# Patient Record
Sex: Female | Born: 1975 | Race: Black or African American | Hispanic: No | Marital: Single | Smoking: Never smoker
Health system: Southern US, Community
[De-identification: ages and names within clinical notes are randomized; demographics above are authoritative.]

## PROBLEM LIST (undated history)

## (undated) DIAGNOSIS — Z809 Family history of malignant neoplasm, unspecified: Secondary | ICD-10-CM

## (undated) DIAGNOSIS — K5909 Other constipation: Secondary | ICD-10-CM

## (undated) DIAGNOSIS — Z923 Personal history of irradiation: Secondary | ICD-10-CM

## (undated) DIAGNOSIS — K589 Irritable bowel syndrome without diarrhea: Secondary | ICD-10-CM

## (undated) DIAGNOSIS — D259 Leiomyoma of uterus, unspecified: Secondary | ICD-10-CM

## (undated) DIAGNOSIS — C50919 Malignant neoplasm of unspecified site of unspecified female breast: Secondary | ICD-10-CM

## (undated) DIAGNOSIS — Z9221 Personal history of antineoplastic chemotherapy: Secondary | ICD-10-CM

## (undated) HISTORY — DX: Leiomyoma of uterus, unspecified: D25.9

## (undated) HISTORY — DX: Family history of malignant neoplasm, unspecified: Z80.9

---

## 2000-10-27 ENCOUNTER — Inpatient Hospital Stay (HOSPITAL_COMMUNITY): Admission: AD | Admit: 2000-10-27 | Discharge: 2000-10-27 | Payer: Self-pay | Admitting: Obstetrics & Gynecology

## 2000-10-28 ENCOUNTER — Inpatient Hospital Stay (HOSPITAL_COMMUNITY): Admission: AD | Admit: 2000-10-28 | Discharge: 2000-10-28 | Payer: Self-pay | Admitting: *Deleted

## 2000-10-31 ENCOUNTER — Inpatient Hospital Stay (HOSPITAL_COMMUNITY): Admission: AD | Admit: 2000-10-31 | Discharge: 2000-10-31 | Payer: Self-pay | Admitting: *Deleted

## 2001-12-29 ENCOUNTER — Inpatient Hospital Stay (HOSPITAL_COMMUNITY): Admission: AD | Admit: 2001-12-29 | Discharge: 2001-12-29 | Payer: Self-pay | Admitting: *Deleted

## 2002-01-01 ENCOUNTER — Inpatient Hospital Stay (HOSPITAL_COMMUNITY): Admission: AD | Admit: 2002-01-01 | Discharge: 2002-01-01 | Payer: Self-pay | Admitting: *Deleted

## 2002-04-15 ENCOUNTER — Inpatient Hospital Stay (HOSPITAL_COMMUNITY): Admission: AD | Admit: 2002-04-15 | Discharge: 2002-04-15 | Payer: Self-pay | Admitting: *Deleted

## 2002-05-14 ENCOUNTER — Encounter: Admission: RE | Admit: 2002-05-14 | Discharge: 2002-05-14 | Payer: Self-pay | Admitting: *Deleted

## 2002-09-03 ENCOUNTER — Inpatient Hospital Stay (HOSPITAL_COMMUNITY): Admission: AD | Admit: 2002-09-03 | Discharge: 2002-09-03 | Payer: Self-pay | Admitting: *Deleted

## 2002-09-04 ENCOUNTER — Ambulatory Visit (HOSPITAL_COMMUNITY): Admission: AD | Admit: 2002-09-04 | Discharge: 2002-09-04 | Payer: Self-pay | Admitting: Obstetrics and Gynecology

## 2002-09-04 HISTORY — PX: BARTHOLIN CYST MARSUPIALIZATION: SHX5383

## 2002-09-07 ENCOUNTER — Inpatient Hospital Stay (HOSPITAL_COMMUNITY): Admission: AD | Admit: 2002-09-07 | Discharge: 2002-09-07 | Payer: Self-pay | Admitting: Obstetrics and Gynecology

## 2002-09-08 ENCOUNTER — Inpatient Hospital Stay (HOSPITAL_COMMUNITY): Admission: AD | Admit: 2002-09-08 | Discharge: 2002-09-08 | Payer: Self-pay | Admitting: Obstetrics and Gynecology

## 2002-09-12 ENCOUNTER — Inpatient Hospital Stay (HOSPITAL_COMMUNITY): Admission: AD | Admit: 2002-09-12 | Discharge: 2002-09-12 | Payer: Self-pay | Admitting: Obstetrics & Gynecology

## 2002-09-13 ENCOUNTER — Inpatient Hospital Stay (HOSPITAL_COMMUNITY): Admission: AD | Admit: 2002-09-13 | Discharge: 2002-09-13 | Payer: Self-pay | Admitting: Obstetrics & Gynecology

## 2002-09-15 ENCOUNTER — Emergency Department (HOSPITAL_COMMUNITY): Admission: EM | Admit: 2002-09-15 | Discharge: 2002-09-15 | Payer: Self-pay | Admitting: Emergency Medicine

## 2003-06-26 ENCOUNTER — Other Ambulatory Visit: Admission: RE | Admit: 2003-06-26 | Discharge: 2003-06-26 | Payer: Self-pay | Admitting: *Deleted

## 2004-05-05 ENCOUNTER — Encounter: Admission: RE | Admit: 2004-05-05 | Discharge: 2004-05-05 | Payer: Self-pay | Admitting: Family Medicine

## 2004-05-31 ENCOUNTER — Other Ambulatory Visit: Admission: RE | Admit: 2004-05-31 | Discharge: 2004-05-31 | Payer: Self-pay | Admitting: Family Medicine

## 2004-07-20 ENCOUNTER — Inpatient Hospital Stay (HOSPITAL_COMMUNITY): Admission: RE | Admit: 2004-07-20 | Discharge: 2004-07-22 | Payer: Self-pay | Admitting: Obstetrics and Gynecology

## 2004-07-20 ENCOUNTER — Encounter (INDEPENDENT_AMBULATORY_CARE_PROVIDER_SITE_OTHER): Payer: Self-pay | Admitting: Specialist

## 2004-07-20 HISTORY — PX: OVARIAN CYST REMOVAL: SHX89

## 2004-07-20 HISTORY — PX: MYOMECTOMY: SHX85

## 2004-08-29 ENCOUNTER — Inpatient Hospital Stay (HOSPITAL_COMMUNITY): Admission: AD | Admit: 2004-08-29 | Discharge: 2004-08-29 | Payer: Self-pay | Admitting: Obstetrics & Gynecology

## 2004-10-09 ENCOUNTER — Inpatient Hospital Stay (HOSPITAL_COMMUNITY): Admission: AD | Admit: 2004-10-09 | Discharge: 2004-10-09 | Payer: Self-pay | Admitting: Obstetrics and Gynecology

## 2004-11-09 ENCOUNTER — Other Ambulatory Visit: Admission: RE | Admit: 2004-11-09 | Discharge: 2004-11-09 | Payer: Self-pay | Admitting: Obstetrics and Gynecology

## 2005-02-03 ENCOUNTER — Ambulatory Visit: Payer: Self-pay | Admitting: Psychiatry

## 2005-02-03 ENCOUNTER — Other Ambulatory Visit (HOSPITAL_COMMUNITY): Admission: RE | Admit: 2005-02-03 | Discharge: 2005-03-17 | Payer: Self-pay | Admitting: Psychiatry

## 2005-07-13 ENCOUNTER — Ambulatory Visit (HOSPITAL_COMMUNITY): Admission: RE | Admit: 2005-07-13 | Discharge: 2005-07-13 | Payer: Self-pay | Admitting: Obstetrics and Gynecology

## 2005-07-13 ENCOUNTER — Encounter (INDEPENDENT_AMBULATORY_CARE_PROVIDER_SITE_OTHER): Payer: Self-pay | Admitting: Specialist

## 2005-07-13 HISTORY — PX: BARTHOLIN GLAND CYST EXCISION: SHX565

## 2005-09-05 ENCOUNTER — Other Ambulatory Visit: Admission: RE | Admit: 2005-09-05 | Discharge: 2005-09-05 | Payer: Self-pay | Admitting: Family Medicine

## 2009-06-02 ENCOUNTER — Other Ambulatory Visit: Admission: RE | Admit: 2009-06-02 | Discharge: 2009-06-02 | Payer: Self-pay | Admitting: Family Medicine

## 2010-08-13 NOTE — Op Note (Signed)
NAMEAIRYN, Candice [name]             ACCOUNT NO.:  [id number]   MEDICAL RECORD NO.:  [id number]          PATIENT TYPE:  INP   LOCATION:  NA                            [facility]   PHYSICIAN:  Michelle L. Grewal, M.D.DATE OF BIRTH:  [birth year]   DATE OF PROCEDURE:  07/20/2004  DATE OF DISCHARGE:                                 OPERATIVE REPORT   PREOPERATIVE DIAGNOSES:  1.  Symptomatic fibroids.  2.  Right ovarian cyst.   POSTOPERATIVE DIAGNOSES:  1.  Symptomatic fibroids.  2.  Right ovarian cyst.   PROCEDURE:  Exploratory laparotomy, abdominal myomectomies and right ovarian  cystectomy.   SURGEON:  Michelle L. Vincente Poli, M.D.   ASSISTANT:  Dineen Kid. Rana Snare, M.D.   ANESTHESIA:  General.   SPECIMENS:  Fibroids.   ESTIMATED BLOOD LOSS:  100 mL.   COMPLICATIONS:  None.   PROCEDURE:  The patient is taken to the operating room.  She is intubated  without difficulty.  She is prepped and draped in a sterile fashion.  A  Foley catheter is inserted and is draining clear urine.  A low transverse  incision is made and carried down to the fascia.  The fascia is scored in  the midline and extended laterally.  The rectus muscles were separated in  the midline and the peritoneum was entered bluntly.  The peritoneal incision  was then stretched.  The uterus was identified and was noted to be myomatous  and large, approximately 12 weeks' size.  It is elevated up through the  abdomen.  There was one large 9 cm fibroid emanating anteriorly, and there  was a smaller subserosal one just lateral to that as well as a smaller third  2 cm one emanating from the posterior wall of the uterus and then a smaller  3 cm one emanating from the left side of the uterus.  We then proceeded with  making a total of three incisions on the uterus.  One was anteriorly, where  two of the anterior fibroids were removed, and then two smaller incisions  made in the posterior wall of the uterus medial to the fallopian  tubes and  well away from the fallopian tubes, and all four fibroids were removed using  blunt and sharp dissection.  Each uterine bed where the fibroids were  removed was closed using a series of layers using 2-0 Vicryl in a continuous  running locked stitch, and the serosa was also closed using running 2-0  Vicryl.  Hemostasis was noted.  Of note, because of the incisions on the  uterus, the patient is not a candidate for labor and would require cesarean  section for delivery.  This had been discussed with the patient prior to  surgery, and she had voiced an understanding of this.  Interceed was placed  on the incision.  We then turned our attention to the right ovary, where a 4  cm cyst was then drained using the Bovie.  It was just a simple follicular  cyst.  Interceed was placed over this area as well.  We placed everything  back in  the abdominal cavity.  A small amount of irrigant was placed on the  uterus to keep the Interceed on the uterine incision.  The peritoneum was  closed using an 0 Vicryl in continuous running stitch, and the rectus  muscles were reapproximated using the 0 Vicryl.  The fascia was closed using  0 Vicryl in continuous running stitch, starting at each corner and meeting  in the midline.  After irrigation of the subcutaneous layer, the skin was  closed with staples.  All sponge, lap and instrument counts were correct x2.  The patient tolerated the procedure well and went to the recovery room in  stable condition.      MLG/MEDQ  D:  07/20/2004  T:  07/20/2004  Job:  [id number]

## 2010-08-13 NOTE — Discharge Summary (Signed)
[name], [name]             ACCOUNT NO.:  [id number]   MEDICAL RECORD NO.:  [id number]          PATIENT TYPE:  INP   LOCATION:  9309                          [facility]   PHYSICIAN:  Michelle L. Grewal, M.D.DATE OF BIRTH:  April 11, 1975   DATE OF ADMISSION:  07/20/2004  DATE OF DISCHARGE:  07/22/2004                                 DISCHARGE SUMMARY   ADMISSION DIAGNOSES:  1.  Symptomatic fibroids.  2.  Right ovarian cyst.   DISCHARGE DIAGNOSES:  1.  Symptomatic fibroids.  2.  Right ovarian cyst.  3.  Status post abdominal myomectomies and right ovarian cystectomy.   HOSPITAL COURSE:  The patient is a 35 year old gravida 0 with symptomatic  fibroids.  She had reported heavy periods and severe pelvic pain and  dyspareunia.  An ultrasound preoperatively revealed also a 3 cm right  ovarian cyst.  On the day of surgery she underwent an abdominal myomectomy  and right ovarian cystectomy.  Intraoperative blood loss was 100 cc.  The  findings at the time of surgery were notable for three fibroids ranging in  size from 2-10 cm.  She did very well postoperatively.  On postoperative day  #1 hemoglobin was 9.4.  She was tolerating a regular diet and voiding  without difficulty.  By postoperative day #2 she was doing very well,  tolerating a regular diet, had good pain control, and had remained afebrile  with stable vital signs.  She was discharged home in good condition on  postoperative day #2.  She was advised to call if she has any nausea and  vomiting, abdominal pain, temperature greater than 100.5 or redness or  drainage from the incision site.  She was advised no driving for two weeks  and no intercourse for six weeks.   DISCHARGE MEDICATIONS:  1.  Ibuprofen 600 mg every 6 hours as needed for pain.  2.  Tylox one-two every 4-6 as needed for pain.   FOLLOW UP:  In the office in one week.      MLG/MEDQ  D:  08/18/2004  T:  08/18/2004  Job:  [id number]

## 2010-08-13 NOTE — Op Note (Signed)
NAMEMILES, BORKOWSKI             ACCOUNT NO.:  [id number]   MEDICAL RECORD NO.:  [id number]          PATIENT TYPE:  AMB   LOCATION:  SDC                           [facility]   PHYSICIAN:  Michelle L. Grewal, M.D.DATE OF BIRTH:  06-25-1975   DATE OF PROCEDURE:  07/13/2005  DATE OF DISCHARGE:                                 OPERATIVE REPORT   PREOPERATIVE DIAGNOSIS:  Left Bartholin's cyst.   POSTOPERATIVE DIAGNOSIS:  Left Bartholin's cyst.   PROCEDURE:  Left Bartholin's cystectomy.   SURGEON:  Michelle L. Vincente Poli, M.D.   SPECIMENS:  Cyst wall.   ANESTHESIA:  MAC with local.   ESTIMATED BLOOD LOSS:  None.   COMPLICATIONS:  None.   PROCEDURE:  The patient is taken to the operating room.  She was given  sedation and placed in the lithotomy position.  She was prepped and draped  in the usual sterile fashion.  Exam under anesthesia revealed a left  Bartholin's cyst about 3 cm and mobile.  It seemed to travel up the left  labium.  I then infiltrated local on the inner aspect of both labia minora  and made a linear incision approximately 2.5 cm in length and then  identified the cyst wall. Upon entry into the cyst wall, a copious amount of  mucous appearing material was drained.  We then used sharp and blunt  dissection and electrocautery and went down to the base of the cyst wall and  removed the cyst wall.  I then cauterized the base because it was very  vascular. We then closed in two layers of this potential space using 0  Vicryl in a continuous running locked stitch.  The skin was then closed  using 2-0 Vicryl in a subcuticular fashion.  At the end of the procedure,  the labia was nicely symmetric with the right one and hemostasis was  excellent.  Neosporin ointment was applied to the incision. All sponge, lap  and instrument counts were correct x2.  The patient went to the recovery  room in stable condition.  In the recovery room, I have instructed the nurse  to place an  ice pack to her perineum.      Michelle L. Vincente Poli, M.D.  Electronically Signed     MLG/MEDQ  D:  07/13/2005  T:  07/13/2005  Job:  [id number]

## 2010-08-13 NOTE — Op Note (Signed)
   NAMEDONAE, KUEKER                         ACCOUNT NO.:  [id number]   MEDICAL RECORD NO.:  [id number]                   PATIENT TYPE:  AMB   LOCATION:  SDC                                  [facility]   PHYSICIAN:  Phil D. Okey Dupre, M.D.                  DATE OF BIRTH:  [birth year]   DATE OF PROCEDURE:  09/04/2002  DATE OF DISCHARGE:                                 OPERATIVE REPORT   PREOPERATIVE DIAGNOSIS:  Recurrent left Bartholin abscess and cyst.   POSTOPERATIVE DIAGNOSIS:  Recurrent left Bartholin abscess and cyst.   PROCEDURE:  Incision and drainage and marsupialization of Bartholin's cyst.   SURGEON:  Phil D. Okey Dupre, M.D.   FINDINGS:  A 3 x 4 cm fluctuant cyst in the Bartholin gland on the left  labia.   DESCRIPTION OF PROCEDURE:  After satisfactory MAC sedation, with the patient  in the dorsal lithotomy position, the perineum and vagina were prepped and  draped in the usual sterile manner.  The left labia was injected with 1%  Xylocaine, the area just inside of the labia minora.  The labia majora was  then folded back to expose the vestibule of the vagina just to the side of  the hymenal ring, a triangular incision 0.75 cm x 0.75 cm x 0.75 were made  into the cavity of the cyst.  The mucoid clear exudate extruded from the  cavity of this cyst and using 3-0 chromic catgut sutures, figure-of-eights,  the area around this cyst cavity was sutured to the outlying mucosa forming  the triangular opening.  Once this was finished, a Word catheter was  inserted into the cavity and the balloon dilated to 8 mL.  There was minimal  bleeding during the procedure.  The patient tolerated the procedure well.  Tape, sponge, needle and instrument counts were reported as correct and the  patient was transferred to the recovery room in satisfactory condition.                                               Phil D. Okey Dupre, M.D.    PDR/MEDQ  D:  09/04/2002  T:  09/04/2002  Job:  [id number]

## 2010-10-27 ENCOUNTER — Other Ambulatory Visit: Payer: Self-pay | Admitting: Family Medicine

## 2010-10-27 ENCOUNTER — Other Ambulatory Visit (HOSPITAL_COMMUNITY)
Admission: RE | Admit: 2010-10-27 | Discharge: 2010-10-27 | Disposition: A | Discharge status: Home / Self Care | Payer: BC Managed Care – PPO | Source: Ambulatory Visit | Attending: Family Medicine | Admitting: Family Medicine

## 2010-10-27 DIAGNOSIS — Z124 Encounter for screening for malignant neoplasm of cervix: Secondary | ICD-10-CM | POA: Insufficient documentation

## 2014-03-28 HISTORY — PX: HYSTEROSCOPY: SHX211

## 2016-03-23 ENCOUNTER — Other Ambulatory Visit: Payer: Self-pay | Admitting: Obstetrics & Gynecology

## 2016-03-23 DIAGNOSIS — Z1231 Encounter for screening mammogram for malignant neoplasm of breast: Secondary | ICD-10-CM

## 2016-03-25 ENCOUNTER — Ambulatory Visit
Admission: RE | Admit: 2016-03-25 | Discharge: 2016-03-25 | Disposition: A | Discharge status: Home / Self Care | Payer: No Typology Code available for payment source | Source: Ambulatory Visit | Attending: Obstetrics & Gynecology | Admitting: Obstetrics & Gynecology

## 2016-03-25 DIAGNOSIS — Z1231 Encounter for screening mammogram for malignant neoplasm of breast: Secondary | ICD-10-CM

## 2016-03-30 ENCOUNTER — Other Ambulatory Visit: Payer: Self-pay | Admitting: Obstetrics & Gynecology

## 2016-03-30 ENCOUNTER — Other Ambulatory Visit (HOSPITAL_COMMUNITY): Payer: Self-pay | Admitting: *Deleted

## 2016-03-30 DIAGNOSIS — R928 Other abnormal and inconclusive findings on diagnostic imaging of breast: Secondary | ICD-10-CM

## 2016-04-14 ENCOUNTER — Ambulatory Visit (HOSPITAL_COMMUNITY): Payer: No Typology Code available for payment source

## 2016-04-14 ENCOUNTER — Other Ambulatory Visit: Payer: No Typology Code available for payment source

## 2016-04-21 ENCOUNTER — Other Ambulatory Visit (HOSPITAL_COMMUNITY): Payer: Self-pay | Admitting: Obstetrics and Gynecology

## 2016-04-21 ENCOUNTER — Encounter (HOSPITAL_COMMUNITY): Payer: Self-pay

## 2016-04-21 ENCOUNTER — Ambulatory Visit
Admission: RE | Admit: 2016-04-21 | Discharge: 2016-04-21 | Disposition: A | Discharge status: Home / Self Care | Payer: No Typology Code available for payment source | Source: Ambulatory Visit | Attending: Obstetrics and Gynecology | Admitting: Obstetrics and Gynecology

## 2016-04-21 ENCOUNTER — Other Ambulatory Visit: Payer: No Typology Code available for payment source

## 2016-04-21 ENCOUNTER — Ambulatory Visit (HOSPITAL_COMMUNITY)
Admission: RE | Admit: 2016-04-21 | Discharge: 2016-04-21 | Disposition: A | Discharge status: Home / Self Care | Payer: Medicaid Other | Source: Ambulatory Visit | Attending: Obstetrics and Gynecology | Admitting: Obstetrics and Gynecology

## 2016-04-21 VITALS — BP 108/70 | Temp 98.1°F | Ht 64.0 in | Wt 139.4 lb

## 2016-04-21 DIAGNOSIS — R928 Other abnormal and inconclusive findings on diagnostic imaging of breast: Secondary | ICD-10-CM | POA: Insufficient documentation

## 2016-04-21 DIAGNOSIS — N6310 Unspecified lump in the right breast, unspecified quadrant: Secondary | ICD-10-CM

## 2016-04-21 DIAGNOSIS — N6315 Unspecified lump in the right breast, overlapping quadrants: Secondary | ICD-10-CM

## 2016-04-21 DIAGNOSIS — Z1239 Encounter for other screening for malignant neoplasm of breast: Secondary | ICD-10-CM

## 2016-04-21 HISTORY — DX: Irritable bowel syndrome, unspecified: K58.9

## 2016-04-21 NOTE — Progress Notes (Signed)
Patient referred to [facility] by the [facility] due to having a screening mammogram on 03/25/2016 that additional imaging of the right breast is recommended.  Pap Smear: Pap smear not completed today. Last Pap smear was 03/11/2016 at the [facility] Department and normal. Per patient has no history of an abnormal Pap smear. Last Pap smear result is not in EPIC.   Physical exam: Breasts Breasts symmetrical. No skin abnormalities bilateral breasts. No nipple retraction bilateral breasts. No nipple discharge bilateral breasts. No lymphadenopathy. No lumps palpated left breast. Palpated a lump within the right breast at 12 o'clock 4 cm from the nipple. No complaints of pain or tenderness on exam. Referred patient to the [facility] for right breast diagnostic mammogram and possible breast ultrasound per recommendation. Appointment scheduled for Thursday, April 21, 2016 at 1450.        Pelvic/Bimanual No Pap smear completed today since last Pap smear was 03/11/2016. Pap smear not indicated per BCCCP guidelines.   Smoking History: Patient has never smoked.  Patient Navigation: Patient education provided. Access to services provided for patient through [facility] program.

## 2016-04-21 NOTE — Patient Instructions (Signed)
Explained breast self awareness with Jolaine Artist. Patient did not need a Pap smear today due to last Pap smear was 03/11/2016. Let her know BCCCP will cover Pap smears every 3 years unless has a history of abnormal Pap smears. Referred patient to the [place] for right breast diagnostic mammogram and possible breast ultrasound per recommendation. Appointment scheduled for Thursday, April 21, 2016 at 1450. Jolaine Artist verbalized understanding.  [place], Arvil Chaco, RN 1:43 PM

## 2016-04-22 ENCOUNTER — Encounter (HOSPITAL_COMMUNITY): Payer: Self-pay | Admitting: *Deleted

## 2016-04-25 ENCOUNTER — Telehealth: Payer: Self-pay | Admitting: *Deleted

## 2016-04-25 NOTE — Telephone Encounter (Signed)
Confirmed [facility] for 04/27/16 at 0815 .  Instructions and contact information given.

## 2016-04-25 NOTE — Telephone Encounter (Signed)
Left vm regarding [facility] [place] for 04/27/16. Contact information provided.

## 2016-04-26 ENCOUNTER — Other Ambulatory Visit: Payer: Self-pay | Admitting: *Deleted

## 2016-04-26 DIAGNOSIS — Z170 Estrogen receptor positive status [ER+]: Secondary | ICD-10-CM

## 2016-04-26 DIAGNOSIS — C50919 Malignant neoplasm of unspecified site of unspecified female breast: Secondary | ICD-10-CM

## 2016-04-26 DIAGNOSIS — C50211 Malignant neoplasm of upper-inner quadrant of right female breast: Secondary | ICD-10-CM

## 2016-04-27 ENCOUNTER — Encounter: Payer: Self-pay | Admitting: Physical Therapy

## 2016-04-27 ENCOUNTER — Ambulatory Visit: Payer: Self-pay

## 2016-04-27 ENCOUNTER — Other Ambulatory Visit (HOSPITAL_BASED_OUTPATIENT_CLINIC_OR_DEPARTMENT_OTHER): Payer: Medicaid Other

## 2016-04-27 ENCOUNTER — Encounter: Payer: Self-pay | Admitting: Oncology

## 2016-04-27 ENCOUNTER — Ambulatory Visit (HOSPITAL_BASED_OUTPATIENT_CLINIC_OR_DEPARTMENT_OTHER): Payer: Self-pay | Admitting: Genetic Counselor

## 2016-04-27 ENCOUNTER — Ambulatory Visit: Payer: Medicaid Other | Attending: Surgery | Admitting: Physical Therapy

## 2016-04-27 ENCOUNTER — Other Ambulatory Visit: Payer: Self-pay | Admitting: *Deleted

## 2016-04-27 ENCOUNTER — Ambulatory Visit (HOSPITAL_BASED_OUTPATIENT_CLINIC_OR_DEPARTMENT_OTHER): Payer: Medicaid Other | Admitting: Oncology

## 2016-04-27 ENCOUNTER — Ambulatory Visit
Admission: RE | Admit: 2016-04-27 | Discharge: 2016-04-27 | Disposition: A | Discharge status: Home / Self Care | Payer: Medicaid Other | Source: Ambulatory Visit | Attending: Radiation Oncology | Admitting: Radiation Oncology

## 2016-04-27 ENCOUNTER — Encounter: Payer: Self-pay | Admitting: Genetic Counselor

## 2016-04-27 ENCOUNTER — Telehealth: Payer: Self-pay | Admitting: *Deleted

## 2016-04-27 DIAGNOSIS — C50211 Malignant neoplasm of upper-inner quadrant of right female breast: Secondary | ICD-10-CM

## 2016-04-27 DIAGNOSIS — Z8042 Family history of malignant neoplasm of prostate: Secondary | ICD-10-CM

## 2016-04-27 DIAGNOSIS — Z170 Estrogen receptor positive status [ER+]: Secondary | ICD-10-CM | POA: Diagnosis present

## 2016-04-27 DIAGNOSIS — Z800 Family history of malignant neoplasm of digestive organs: Secondary | ICD-10-CM

## 2016-04-27 DIAGNOSIS — Z8041 Family history of malignant neoplasm of ovary: Secondary | ICD-10-CM

## 2016-04-27 DIAGNOSIS — C50919 Malignant neoplasm of unspecified site of unspecified female breast: Secondary | ICD-10-CM

## 2016-04-27 DIAGNOSIS — Z803 Family history of malignant neoplasm of breast: Secondary | ICD-10-CM

## 2016-04-27 LAB — COMPREHENSIVE METABOLIC PANEL
ALT: 12 U/L (ref 0–55)
AST: 27 U/L (ref 5–34)
Albumin: 3.8 g/dL (ref 3.5–5.0)
Alkaline Phosphatase: 38 U/L — ABNORMAL LOW (ref 40–150)
Anion Gap: 8 mEq/L (ref 3–11)
BUN: 9.1 mg/dL (ref 7.0–26.0)
CO2: 25 mEq/L (ref 22–29)
Calcium: 9.5 mg/dL (ref 8.4–10.4)
Chloride: 110 mEq/L — ABNORMAL HIGH (ref 98–109)
Creatinine: 0.8 mg/dL (ref 0.6–1.1)
EGFR: >90 mL/min/{1.73_m2} (ref >90)
Glucose: 87 mg/dl (ref 70–140)
Potassium: 3.9 mEq/L (ref 3.5–5.1)
Sodium: 142 mEq/L (ref 136–145)
Total Bilirubin: 0.35 mg/dL (ref 0.20–1.20)
Total Protein: 7.2 g/dL (ref 6.4–8.3)

## 2016-04-27 LAB — CBC WITH DIFFERENTIAL/PLATELET
BASO%: 0 % (ref 0.0–2.0)
Basophils Absolute: 0 10*3/uL (ref 0.0–0.1)
EOS%: 1.9 % (ref 0.0–7.0)
Eosinophils Absolute: 0.1 10*3/uL (ref 0.0–0.5)
HCT: 40.9 % (ref 34.8–46.6)
HGB: 14.1 g/dL (ref 11.6–15.9)
LYMPH%: 41.2 % (ref 14.0–49.7)
MCH: 32 pg (ref 25.1–34.0)
MCHC: 34.5 g/dL (ref 31.5–36.0)
MCV: 92.7 fL (ref 79.5–101.0)
MONO#: 0.3 10*3/uL (ref 0.1–0.9)
MONO%: 6.3 % (ref 0.0–14.0)
NEUT#: 2.1 10*3/uL (ref 1.5–6.5)
NEUT%: 50.6 % (ref 38.4–76.8)
Platelets: 218 10*3/uL (ref 145–400)
RBC: 4.41 10*6/uL (ref 3.70–5.45)
RDW: 13.3 % (ref 11.2–14.5)
WBC: 4.1 10*3/uL (ref 3.9–10.3)
lymph#: 1.7 10*3/uL (ref 0.9–3.3)

## 2016-04-27 MED ORDER — TAMOXIFEN CITRATE 20 MG PO TABS: 20.0000 mg | ORAL_TABLET | Freq: Every day | ORAL | 4 refills | Status: DC

## 2016-04-27 NOTE — Patient Instructions (Signed)

## 2016-04-27 NOTE — Telephone Encounter (Signed)
Received order per Dr. Jana Hakim for oncotype testing. Requisition sent to [facility]

## 2016-04-27 NOTE — Therapy (Signed)
[place], [place], [address] Phone: [phone]   Fax:  [phone]  Physical Therapy Evaluation  Patient Details  Name: Candice [name] MRN: [id number] Date of Birth: [birth year] Referring Provider: Dr. Alphonsa Overall  Encounter Date: 04/27/2016      PT End of Session - 04/27/16 1528    Visit Number 1   Number of Visits 1   PT Start Time 1130   PT Stop Time 1155   PT Time Calculation (min) 25 min   Activity Tolerance Patient tolerated treatment well   Behavior During Therapy [facility] for tasks assessed/performed      Past Medical History:  Diagnosis Date  . Family history of breast cancer   . Family history of ovarian cancer   . Family history of pancreatic cancer   . Family history of prostate cancer   . IBS (irritable bowel syndrome)     Past Surgical History:  Procedure Laterality Date  . HYSTEROSCOPY    . MYOMECTOMY      There were no vitals filed for this visit.       Subjective Assessment - 04/27/16 1528    Subjective Patient reports she is here to be seen by her medical team for her newly diagnosed right breast cancer.   Patient is accompained by: Family member   Pertinent History Patient was diagnosed on 03/25/16 with right invasive ductal carcinoma breast cancer. It measures 2.2 cm and is located in the upper inner quadrant. It is ER/PR positive and HER2 negative with a Ki67 of 20%.    Patient Stated Goals Reduce lymphedema risk and learn post op shoulder ROM HEP            [facility] PT Assessment - 04/27/16 0001      Assessment   Medical Diagnosis Right breast cancer   Referring Provider Dr. Alphonsa Overall   Onset Date/Surgical Date 03/25/16   Hand Dominance Right   Prior Therapy none     Precautions   Precautions Other (comment)   Precaution Comments active cancer     Restrictions   Weight Bearing Restrictions No     Balance Screen   Has the patient fallen in the  past 6 months No   Has the patient had a decrease in activity level because of a fear of falling?  No   Is the patient reluctant to leave their home because of a fear of falling?  No     Home Environment   Living Environment Private residence   Living Arrangements Alone   Available Help at Discharge Family     Prior Function   Level of Independence Independent   Vocation Unemployed  Lost her job in 8/17 but has her PhD in [place]. studies   Leisure She works out at [employer] 4-5x/week with 1 hour of cardio and weights each time     Posture/Postural Control   Posture/Postural Control No significant limitations     ROM / Strength   AROM / PROM / Strength AROM;Strength     AROM   AROM Assessment Site Shoulder;Cervical   Right/Left Shoulder Right;Left   Right Shoulder Extension 44 Degrees   Right Shoulder Flexion 162 Degrees   Right Shoulder ABduction 180 Degrees   Right Shoulder Internal Rotation 80 Degrees   Right Shoulder External Rotation 90 Degrees   Left Shoulder Extension 33 Degrees   Left Shoulder Flexion 156 Degrees   Left Shoulder ABduction 176 Degrees   Left  Shoulder Internal Rotation 85 Degrees   Left Shoulder External Rotation 90 Degrees   Cervical Flexion WNL   Cervical Extension WNL   Cervical - Right Side Bend WNL   Cervical - Left Side Bend WNL   Cervical - Right Rotation WNL   Cervical - Left Rotation WNL     Strength   Overall Strength Within functional limits for tasks performed           LYMPHEDEMA/ONCOLOGY QUESTIONNAIRE - 04/27/16 1524      Type   Cancer Type Right breast cancer     Lymphedema Assessments   Lymphedema Assessments Upper extremities     Right Upper Extremity Lymphedema   10 cm Proximal to Olecranon Process 28.8 cm   Olecranon Process 22.9 cm   10 cm Proximal to Ulnar Styloid Process 20.5 cm   Just Proximal to Ulnar Styloid Process 14.5 cm   Across Hand at [employer] 19 cm   At [place] of 2nd Digit 6 cm     Left Upper  Extremity Lymphedema   10 cm Proximal to Olecranon Process 28.8 cm   Olecranon Process 23 cm   10 cm Proximal to Ulnar Styloid Process 20 cm   Just Proximal to Ulnar Styloid Process 14.8 cm   Across Hand at [employer] 18.5 cm   At [place] of 2nd Digit 5.9 cm           Patient was instructed today in a home exercise program today for post op shoulder range of motion. These included active assist shoulder flexion in sitting, scapular retraction, wall walking with shoulder abduction, and hands behind head external rotation.  She was encouraged to do these twice a day, holding 3 seconds and repeating 5 times when permitted by her physician.                 PT Education - 04/27/16 1527    Education provided Yes   Education Details Post op shoulder ROM HEP and lymphedema risk reduction   Person(s) Educated Patient;Parent(s)   Methods Explanation;Demonstration;Handout   Comprehension Returned demonstration;Verbalized understanding              Breast Clinic Goals - 04/27/16 1535      Patient will be able to verbalize understanding of pertinent lymphedema risk reduction practices relevant to her diagnosis specifically related to skin care.   Time 1   Period Days   Status Achieved     Patient will be able to return demonstrate and/or verbalize understanding of the post-op home exercise program related to regaining shoulder range of motion.   Time 1   Period Days   Status Achieved     Patient will be able to verbalize understanding of the importance of attending the postoperative After Breast Cancer Class for further lymphedema risk reduction education and therapeutic exercise.   Time 1   Period Days   Status Achieved              Plan - 04/27/16 1528    Clinical Impression Statement Patient was diagnosed on 03/25/16 with right invasive ductal carcinoma breast cancer. It measures 2.2 cm and is located in the upper inner quadrant. It is ER/PR positive and  HER2 negative with a Ki67 of 20%. Her multidisciplinary medical team met prior to her assessments to determine a recommended treatment plan.  She is planning to have a right lumpectomy and sentinel node biopsy followed by Oncotype testing, radiation, and anti-estrogen therapy.  She  may benefit from post op PT to regain ROM and strength as she is very focused on fitness.  Due to her alck of comorbidities, her eval is of low complexity.   Rehab Potential Excellent   Clinical Impairments Affecting Rehab Potential none   PT Frequency One time visit   PT Treatment/Interventions Therapeutic exercise;Patient/family education   PT Next Visit Plan Will f/u after surgery to determine PT needs   PT Home Exercise Plan Post op shoulder ROM HEP   Consulted and Agree with Plan of Care Patient;Family member/caregiver   Family Member Consulted Mother, sister, father      Patient will benefit from skilled therapeutic intervention in order to improve the following deficits and impairments:  Impaired UE functional use, Decreased knowledge of precautions  Visit Diagnosis: Carcinoma of upper-inner quadrant of right breast in female, estrogen receptor positive ([place]) - Plan: PT plan of care cert/re-cert   Patient will follow up at outpatient cancer rehab if needed following surgery.  If the patient requires physical therapy at that time, a specific plan will be dictated and sent to the referring physician for approval. The patient was educated today on appropriate basic range of motion exercises to begin post operatively and the importance of attending the After Breast Cancer class following surgery.  Patient was educated today on lymphedema risk reduction practices as it pertains to recommendations that will benefit the patient immediately following surgery.  She verbalized good understanding.  No additional physical therapy is indicated at this time.      Problem List Patient Active Problem List   Diagnosis Date  Noted  . Malignant neoplasm of upper-inner quadrant of right breast in female, estrogen receptor positive ([place]) 04/27/2016  . Family history of breast cancer   . Family history of ovarian cancer   . Family history of prostate cancer   . Family history of pancreatic cancer     Annia Friendly, PT 04/27/16 3:39 PM   [place], [place], [address] Phone: [phone]   Fax:  [phone]  Name: TROYCE FEBO MRN: [id number] Date of Birth: [birth year]

## 2016-04-27 NOTE — Progress Notes (Signed)
REFERRING PROVIDER: Lurline Del, MD  PRIMARY PROVIDER:  No PCP Per Patient  PRIMARY REASON FOR VISIT:  1. Malignant neoplasm of upper-inner quadrant of right breast in female, estrogen receptor positive ([name])   2. Family history of breast cancer   3. Family history of ovarian cancer   4. Family history of prostate cancer   5. Family history of pancreatic cancer      HISTORY OF PRESENT ILLNESS:   Candice [name], a 41 y.o. female, was seen for a Candice [name] cancer genetics consultation at the request of Dr. Jana [name] due to a personal and family history of cancer.  Candice [name] presents to clinic today to discuss the possibility of a hereditary predisposition to cancer, genetic testing, and to further clarify her future cancer risks, as well as potential cancer risks for family members.   In January 2018, at the age of 25, Candice [name] was diagnosed with invasive ductal carcinoma of the right breast.  The tumor is ER+/PR+/Her2-.  She is undergoing an MRI on Friday and surgery will depend on her genetic testing results. The patient has a history of GI issues, causing severe constipation.  She had a leiomyoma removed several years ago.   CANCER HISTORY:   No history exists.     HORMONAL RISK FACTORS:  Menarche was at age 75.  First live birth at age N/A.  OCP use for approximately 16 years.  Ovaries intact: yes.  Hysterectomy: no.  Menopausal status: premenopausal.  HRT use: 0 years. Colonoscopy: yes; normal. Mammogram within the last year: yes. Number of breast biopsies: 1. Up to date with pelvic exams:  yes. Any excessive radiation exposure in the past:  no  Past Medical History:  Diagnosis Date  . Family history of breast cancer   . Family history of ovarian cancer   . Family history of pancreatic cancer   . Family history of prostate cancer   . IBS (irritable bowel syndrome)     Past Surgical History:  Procedure Laterality Date  . HYSTEROSCOPY    . MYOMECTOMY       Social History   Social History  . Marital status: Single    Spouse name: N/A  . Number of children: N/A  . Years of education: N/A   Social History Main Topics  . Smoking status: Never Smoker  . Smokeless tobacco: Never Used  . Alcohol use Yes     Comment: RARELY  . Drug use: No  . Sexual activity: Yes    Birth control/ protection: Pill   Other Topics Concern  . None   Social History Narrative  . None     FAMILY HISTORY:  We obtained a detailed, 4-generation family history.  Significant diagnoses are listed below: Family History  Problem Relation Age of Onset  . Breast cancer Mother 10  . Pancreatic cancer Paternal Grandmother   . Prostate cancer Paternal Grandfather   . Ovarian cancer Other     MGMs sister  . Breast cancer Other     mother's maternal first cousin    The patient does not have children.  She has one sister who is cancer free.  Her mother had breast cancer at age 44.  Her mother is an only child.  Her maternal grandparents are deceased.  Her grandfather died in a car accident and her grandmother died at 80.  Her grandmother was one of 14 children.  One sister had an unknown cancer and another sister had ovarian cancer. This sister  had a daughter with breast cancer.  The patient's father has three brothers and three sisters, none who had cancer.  Her paternal grandmother had pancreatic cancer and her grandfather had prostate cancer.    Candice [name] is unaware of previous family history of genetic testing for hereditary cancer risks. Patient's maternal ancestors are of [place], [place], [place], [place] descent, and paternal ancestors are of [place], [place] descent. There is no reported Ashkenazi Jewish ancestry. There is no known consanguinity.  GENETIC COUNSELING ASSESSMENT: Candice [name] is a 41 y.o. female with a personal and family history of cancer which is somewhat suggestive of a hereditary cancer syndrome and  predisposition to cancer. We, therefore, discussed and recommended the following at today's visit.   DISCUSSION: We disucssed that about 5-10% of breast cancer is hereditary with most cases due to BRCA mutations.  Other hereditary genes include ATM, CHEK2 and PALB2.  We reviewed the characteristics, features and inheritance patterns of hereditary cancer syndromes. We also discussed genetic testing, including the appropriate family members to test, the process of testing, insurance coverage and turn-around-time for results. We discussed the implications of a negative, positive and/or variant of uncertain significant result. We recommended Candice [name] pursue genetic testing for the [facility] gene panel. The [facility] gene panel offered by [employer] includes sequencing and deletion/duplication testing of the following 28 genes: APC, ATM, BARD1, BMPR1A, BRCA1, BRCA2, BRIP1, CHD1, CDK4, CDKN2A, CHEK2, EPCAM (large rearrangement only), MLH1, MSH2, MSH6, MUTYH, NBN, PALB2, PMS2, PTEN, RAD51C, RAD51D, SMAD4, STK11, and TP53. Sequencing was performed for select regions of POLE and POLD1, and large rearrangement analysis was performed for select regions of GREM1.   Based on Candice [name] personal and family history of cancer, she meets medical criteria for genetic testing. She however does not have health insurance.  We have completed an application to [facility] ([facility]), based on the patient's lack of insurance.  If accepted she will be covered by their program for genetic testing.  PLAN: After considering the risks, benefits, and limitations, Candice [name]  provided informed consent to pursue genetic testing and the blood sample was sent to [facility] for analysis of the [facility]. Results should be available within approximately 2-3 weeks' time, at which point they will be disclosed by telephone to Candice [name], as will any additional recommendations  warranted by these results. Candice [name] will receive a summary of her genetic counseling visit and a copy of her results once available. This information will also be available in Epic. We encouraged Candice [name] to remain in contact with cancer genetics annually so that we can continuously update the family history and inform her of any changes in cancer genetics and testing that may be of benefit for her family. Candice [name] questions were answered to her satisfaction today. Our contact information was provided should additional questions or concerns arise.  Lastly, we encouraged Candice [name] to remain in contact with cancer genetics annually so that we can continuously update the family history and inform her of any changes in cancer genetics and testing that may be of benefit for this family.   Ms.  [name] questions were answered to her satisfaction today. Our contact information was provided should additional questions or concerns arise. Thank you for the referral and allowing [place] to share in the care of your patient.   Candice [name], [place], [facility] Certified Genetic Counselor [name] phone: [phone]  The patient was seen for a  total of 45 minutes in face-to-face genetic counseling.  This patient was discussed with Drs. Magrinat, Lindi Adie and/or Burr Medico who agrees with the above.    _______________________________________________________________________ For Office Staff:  Number of people involved in session: 2 Was an Intern/ student involved with case: no

## 2016-04-27 NOTE — Progress Notes (Signed)
Candice [name]  [phone] [phone]     ID: Candice [name] DOB: Nov 08, 1975  MR#: [id number]  TWS#:[id number]  Patient Care Team: No Pcp Per Patient as PCP - General ([place]) Alphonsa Overall, MD as Consulting Physician (General Surgery) Chauncey Cruel, MD as Consulting Physician (Oncology) Eppie Gibson, MD as Attending Physician (Radiation Oncology) Chauncey Cruel, MD OTHER MD:  CHIEF COMPLAINT: estrogen receptor positive breast cancer  CURRENT TREATMENT: continuing workup in progress   BREAST CANCER HISTORY: Candice [name] [facility] [place] in December, at which time a mass in the right breast was noted. She was referred for B[facility]CP and screening mammography was obtained, showing a possible mass in the right breast.on 04/21/2016 she underwent right diagnostic mammography with tomography and ultrasonography at the breast Center, and this confirmedan irregular spiculat in the right breast measuring 1.8 cm, which was not palpable to the mammographer. Ultrasonography confirmed an irregular hypoechoic right breast mass at the 12:30 o'clock position 4 cm from the nipple measuring 2.2 cm. The right axilla was sonographically benign.  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  Her subsequent history is detailed below  INTERVAL HISTORY: Candice [name] was evaluated in the multidisciplinary breast cancer clinic 04/27/2016 accompanied by her parents and sister. Her case was also presented in the multidisciplinary breast cancer conference that same morning. At that time a preliminary plan was proposed: Genetics testing, breast MRI, Oncotype to be sent from the  original biopsy,   REVIEW OF SYSTEMS:     There were no specific symptoms leading to the original exam, which led to the diagnostic mammogram. The patient denies unusual headaches, visual changes, nausea, vomiting, stiff neck, dizziness, or gait imbalance. There has been no cough, phlegm production, or pleurisy, no chest pain or pressure, and no change in bowel or bladder habits. The patient denies fever, rash, bleeding, unexplained fatigue or unexplained weight loss. She exercises regularly and vigorously doing cardio and weights most days. She has a significant history of constipation which he treats with daily laxatives. A detailed review of systems was otherwise entirely negative.   PAST MEDICAL HISTORY: Past Medical History:  Diagnosis Date  . IBS (irritable bowel syndrome)     PAST SURGICAL HISTORY: Past Surgical History:  Procedure Laterality Date  . HYSTEROSCOPY    . MYOMECTOMY      FAMILY HISTORY Family History  Problem Relation Age of Onset  . Breast cancer Mother   The patient's father, [name], lives in [place] and works as a Geneticist, molecular. The patient's mother is a Quarry manager. The patient has no brother, one sister, who works in [employer] as a Network engineer. The patient's mother had breast cancer, stage 0, diagnosed at age 47. There are 2 maternal relatives (mother's sister and mother's niece) with breast cancer, both postmenopausal.  GYNECOLOGIC HISTORY:  Menarche age 67, the patient is GX P0. She has regular periods, most recently mid-January. She is on oral contraceptives at present. Patient's last menstrual period was 04/06/2016 (exact date).   SOCIAL HISTORY:  The patient has a PhD degree and has worked for [employer] here and in [place] but is currently unemployed. She generally lives in [place] by herself although she is currently staying  at her parents.    ADVANCED DIRECTIVES: Not in place. The patient tells me she intends to name her  sister as her healthcare power of attorney.   HEALTH MAINTENANCE: Social History  Substance Use Topics  . Smoking status: Never Smoker  . Smokeless tobacco: Never Used  . Alcohol use Yes     Comment: RARELY     Colonoscopy: July 2016, in [place]  PAP:  Bone density: Never   Allergies  Allergen Reactions  . Amoxicillin Rash    Current Outpatient Prescriptions  Medication Sig Dispense Refill  . docusate sodium (COLACE) 100 MG capsule Take 100 mg by mouth 2 (two) times daily.    Marland Kitchen OVER THE COUNTER MEDICATION Take 7 tablets by mouth 2 (two) times daily.    Marland Kitchen PRESCRIPTION MEDICATION Take 1 tablet by mouth daily.     No current facility-administered medications for this visit.     OBJECTIVE: Young-appearing African-American woman in no acute distress Vitals:   04/27/16 0853  BP: (!) 113/55  Pulse: 63  Resp: 20  Temp: 97.9 F (36.6 C)     Body mass index is 24.13 kg/m.    ECOG FS:0 - Asymptomatic  Ocular: Sclerae unicteric, pupils equal, round and reactive to light Ear-nose-throat: Oropharynx clear and moist Lymphatic: No cervical or supraclavicular adenopathy Lungs no rales or rhonchi, good excursion bilaterally Heart regular rate and rhythm, no murmur appreciated Abd soft, nontender, positive bowel sounds MSK no focal spinal tenderness, no joint edema Neuro: non-focal, well-oriented, appropriate affect Breasts: In the right breast there is an easily palpable mass in the upper-outer quadrant associated with recent biopsy skin change. There is no other finding of concern in the nipple is not retracted. The right axilla is benign. The left breast is unremarkable   LAB RESULTS:  CMP     Component Value Date/Time   NA 142 04/27/2016 0833   K 3.9 04/27/2016 0833   CO2 25 04/27/2016 0833   GLUCOSE 87 04/27/2016 0833   BUN 9.1 04/27/2016 0833   CREATININE 0.8 04/27/2016 0833   CALCIUM 9.5 04/27/2016 0833   PROT 7.2 04/27/2016 0833   ALBUMIN 3.8 04/27/2016 0833    AST 27 04/27/2016 0833   ALT 12 04/27/2016 0833   ALKPHOS 38 (L) 04/27/2016 0833   BILITOT 0.35 04/27/2016 0833    INo results found for: SPEP, UPEP  Lab Results  Component Value Date   WBC 4.1 04/27/2016   NEUTROABS 2.1 04/27/2016   HGB 14.1 04/27/2016   HCT 40.9 04/27/2016   MCV 92.7 04/27/2016   PLT 218 04/27/2016      Chemistry      Component Value Date/Time   NA 142 04/27/2016 0833   K 3.9 04/27/2016 0833   CO2 25 04/27/2016 0833   BUN 9.1 04/27/2016 0833   CREATININE 0.8 04/27/2016 0833      Component Value Date/Time   CALCIUM 9.5 04/27/2016 0833   ALKPHOS 38 (L) 04/27/2016 0833   AST 27 04/27/2016 0833   ALT 12 04/27/2016 0833   BILITOT 0.35 04/27/2016 0833       No results found for: LABCA2  No components found for: [id number]  No results for input(s): INR in the last 168 hours.  Urinalysis No results found for: COLORURINE, APPEARANCEUR, LABSPEC, PHURINE, GLUCOSEU, HGBUR, BILIRUBINUR, KETONESUR, PROTEINUR, UROBILINOGEN, NITRITE, LEUKOCYTESUR   STUDIES: US Breast Ltd Uni Right Inc Axilla  Result Date: 04/21/2016 CLINICAL DATA:  The patient was called back from screening mammography in December  2018 for suspicious mass. EXAM: 2D DIGITAL DIAGNOSTIC RIGHT MAMMOGRAM WITH CAD AND ADJUNCT TOMO ULTRASOUND RIGHT BREAST COMPARISON:  Previous exam(s). ACR Breast Density Category c: The breast tissue is heterogeneously dense, which may obscure small masses. FINDINGS: There is an irregular spiculated mass containing calcifications in the right breast measuring 1.8 cm mammographically. Mammographic images were processed with CAD. On physical exam, no suspicious lumps identified. Targeted ultrasound is performed, showing an irregular hypoechoic mass at 12:30, 4 cm from the nipple measuring 2.2 x 1.5 x 1.7 cm containing calcifications and internal blood flow. No abnormal lymph nodes in the axilla. IMPRESSION: Highly suspicious right breast mass RECOMMENDATION: Recommend  ultrasound-guided biopsy of the highly suspicious right breast mass I have discussed the findings and recommendations with the patient. Results were also provided in writing at the conclusion of the visit. If applicable, a reminder letter will be sent to the patient regarding the next appointment. BI-RADS CATEGORY  4: Suspicious. Electronically Signed   By: Dorise Bullion III M.D   On: 04/21/2016 14:51   Mm Diag Breast Tomo Uni Right  Result Date: 04/21/2016 CLINICAL DATA:  The patient was called back from screening mammography in December 2018 for suspicious mass. EXAM: 2D DIGITAL DIAGNOSTIC RIGHT MAMMOGRAM WITH CAD AND ADJUNCT TOMO ULTRASOUND RIGHT BREAST COMPARISON:  Previous exam(s). ACR Breast Density Category c: The breast tissue is heterogeneously dense, which may obscure small masses. FINDINGS: There is an irregular spiculated mass containing calcifications in the right breast measuring 1.8 cm mammographically. Mammographic images were processed with CAD. On physical exam, no suspicious lumps identified. Targeted ultrasound is performed, showing an irregular hypoechoic mass at 12:30, 4 cm from the nipple measuring 2.2 x 1.5 x 1.7 cm containing calcifications and internal blood flow. No abnormal lymph nodes in the axilla. IMPRESSION: Highly suspicious right breast mass RECOMMENDATION: Recommend ultrasound-guided biopsy of the highly suspicious right breast mass I have discussed the findings and recommendations with the patient. Results were also provided in writing at the conclusion of the visit. If applicable, a reminder letter will be sent to the patient regarding the next appointment. BI-RADS CATEGORY  4: Suspicious. Electronically Signed   By: Dorise Bullion III M.D   On: 04/21/2016 14:51   Mm Clip Placement Right  Result Date: 04/21/2016 CLINICAL DATA:  Post biopsy mammogram of the right breast for clip placement. EXAM: DIAGNOSTIC RIGHT MAMMOGRAM POST ULTRASOUND BIOPSY COMPARISON:  Previous  exam(s). FINDINGS: Mammographic images were obtained following ultrasound guided biopsy of right breast mass at 12:30. The ribbon shaped biopsy marking clip is appropriately positioned within the biopsied mass at 12:30. IMPRESSION: Appropriate positioning of the ribbon shaped biopsy marking clip within the mass at 12:30 in the right breast. Final Assessment: Post Procedure Mammograms for Marker Placement Electronically Signed   By: Ammie Ferrier M.D.   On: 04/21/2016 16:34   [place] Rt Breast Bx W Loc Dev 1st Lesion Img Bx Spec US Guide  Addendum Date: 04/22/2016   ADDENDUM REPORT: 04/22/2016 14:43 ADDENDUM: Pathology revealed GRADE II INVASIVE DUCTAL CARCINOMA, DUCTAL CARCINOMA IN SITU, ASSOCIATED CALCIFICATIONS of the Right breast, 12:30 o'clock. This was found to be concordant by Dr. Ammie Ferrier. Pathology results were discussed with the patient by telephone. The patient reported doing well after the biopsy with tenderness at the site. Post biopsy instructions and care were reviewed and questions were answered. The patient was encouraged to call [facility] for any additional concerns. The patient was referred to [facility] on April 27, 2016. Pathology results reported by Terie Purser, RN on 04/22/2016. Electronically Signed   By: Ammie Ferrier M.D.   On: 04/22/2016 14:43   Result Date: 04/22/2016 CLINICAL DATA:  41 year old female presenting for ultrasound-guided biopsy of a right breast mass. EXAM: ULTRASOUND GUIDED RIGHT BREAST CORE NEEDLE BIOPSY COMPARISON:  Previous exam(s). FINDINGS: I met with the patient and we discussed the procedure of ultrasound-guided biopsy, including benefits and alternatives. We discussed the high likelihood of a successful procedure. We discussed the risks of the procedure, including infection, bleeding, tissue injury, clip migration, and inadequate sampling.  Informed written consent was given. The usual time-out protocol was performed immediately prior to the procedure. Using sterile technique and 1% Lidocaine as local anesthetic, under direct ultrasound visualization, a 14 gauge spring-loaded device was used to perform biopsy of a right breast mass at 12:30 using an inferomedial approach. At the conclusion of the procedure a ribbon shaped tissue marker clip was deployed into the biopsy cavity. Follow up 2 view mammogram was performed and dictated separately. IMPRESSION: Ultrasound guided biopsy of right breast mass at 12:30. No apparent complications. Electronically Signed: By: Ammie Ferrier M.D. On: 04/21/2016 16:31    ELIGIBLE FOR AVAILABLE RESEARCH PROTOCOL: *no  ASSESSMENT: 41 y.o. Candice [name] woman currently residing in [place], status post right breast upper inner quadrant biopsy 04/21/2016 for a clinical T2 N0, stage IIa invasive ductal carcinoma, grade 2, estrogen and progesterone receptor positive, HER-2 nonamplified, with an MIB-1 of 20%  (1) to start tamoxifen 04/27/2016 with pending results of additional studies  (2) Oncotype DX to be obtained from the original biopsy  (3) she is likely to benefit from chemotherapy, which will consist of doxorubicin and cyclophosphamide in dose dense fashion followed by taxanes weekly for 12 weeks  (a) Patient is not interested in fertility preservation  (4) genetics testing 04/27/2016 to guide surgical choices  (5) definitive surgery to follow  (6) adjuvant radiation is appropriate  (7) anti-estrogens to resume at the completion of local treatment  PLAN: We spent the better part of today's hour-long appointment discussing the biology of breast cancer in general, and the specifics of the patient's tumor in particular. We first reviewed the fact that cancer is not one disease but more than 100 different diseases and that it is important to keep them separate-- otherwise when friends  and relatives discuss their own cancer experiences with Danissa confusion can result. Similarly we explained that if breast cancer spreads to the bone or liver, the patient would not have bone cancer or liver cancer, but breast cancer in the bone and breast cancer in the liver: one cancer in three places-- not 3 different cancers which otherwise would have to be treated in 3 different ways.  We discussed the difference between local and systemic therapy. In terms of loco-regional treatment, lumpectomy plus radiation is equivalent to mastectomy as far as survival is concerned. For this reason, and because the cosmetic results are generally superior, we recommend breast conserving surgery. We also noted that in terms of sequencing of treatments, whether systemic therapy or surgery is done first does not affect the ultimate outcome. This is relevant to Dasja's case, as we believe she will benefit from neoadjuvant treatment in terms of shrinking her tumor and making the cosmetic result more favorable.  We then discussed the rationale for systemic therapy. There is some risk that this cancer may have already spread  to other parts of her body. Patients frequently ask at this point about bone scans, CAT scans and PET scans to find out if they have occult breast cancer somewhere else. The problem is that in early stage disease we are much more likely to find false positives then true cancers and this would expose the patient to unnecessary procedures as well as unnecessary radiation. Scans cannot answer the question the patient really would like to know, which is whether she has microscopic disease elsewhere in her body. For those reasons we do not recommend them.  Of course we would proceed to aggressive evaluation of any symptoms that might suggest metastatic disease, but that is not the case here.  Next we went over the options for systemic therapy which are anti-estrogens, anti-HER-2 immunotherapy, and  chemotherapy. Devorah does not meet criteria for anti-HER-2 immunotherapy. She is a good candidate for anti-estrogens.  The question of chemotherapy is more complicated. Chemotherapy is most effective in rapidly growing, aggressive tumors. It is much less effective in medium grade, slow growing cancers, like Tracey 's. For that reason we are going to request an Oncotype from the original biopsy, as suggested by NCCN guidelines. That will help [place] make a definitive decision regarding chemotherapy in this case.  She understands that chemotherapy has a significant chance of putting her into permanent menopause and that means she would never be able to have children. I offered her goserelin for fertility protection, but she really is not interested, feeling she is already 54, not married, not planning to have children in the future.  Finally, she qualifies for genetics testing. We will try to expedite that as much as possible. She understands if she does carry a deleterious mutation bilateral mastectomy is not mandatory but is the most frequently chosen option. In that case she would be interested in reconstruction.  The plan then is for genetics testing, Oncotype testing, and unless the tumor falls squarely in the low-risk group, neoadjuvant chemotherapy to consist of cyclophosphamide and doxorubicin in dose dense fashion 4, to be followed taxanes weekly 12. She will then have breast conserving surgery with sentinel lymph node sampling followed by radiation.  Because there are possible delays given this complex plan she was started on tamoxifen today. We discussed the possible toxicities, side effects and complications of this agent. I have also asked her to stop taking birth control pills at this point. she was taking them primarily for prevention of fibroids recurrence.  The patient has a good understanding of the overall plan. She agrees with it. She knows the goal of treatment in her case is cure. She  will call with any problems that may develop before her next visit here.  Chauncey Cruel, MD   04/27/2016 10:18 AM Medical Oncology and Hematology [facility] [address] [place], [place] [address] Tel. [phone]    Fax. [phone]

## 2016-04-27 NOTE — Progress Notes (Signed)
Radiation Oncology         [phone] ________________________________  Initial Outpatient Consultation  Name: Candice [name] MRN: [id number]  Date: 04/27/2016  DOB: 06-Sep-1975  CC:No PCP Per Patient  Alphonsa Overall, MD   REFERRING PHYSICIAN: Alphonsa Overall, MD  DIAGNOSIS:    ICD-9-CM ICD-10-CM   1. 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 female who presented with screening detected right breast mass and suspicious calcifications in the upper outer quadrant.  Ultrasound of the right breast showed a 2.2 x 1.5 x 1.7 cm mass at the 12:30 position.  Axilla was negative on ultrasound.  Biopsy showed invasive ductal carcinoma with characteristics as described above in the diagnosis.  On review of systems, the patient is positive for night sweats, weight changes, dental problems, and loss of sleep. She reports heartburn and chronic constipation - follows with GI. The patient reports back pain only related to weight lifting.  She works out often.  Of note, the patient was age 23 at menarche. She is currently having regular periods.  Her last colonoscopy was July 2016. She has never had a bone density scan. Her last pap smear was 03/11/16. She did not have regular mammograms prior to this instance.  She is accompanied today by her parents and her sister.  PREVIOUS RADIATION THERAPY: No  PAST MEDICAL HISTORY:  has a past medical history of IBS (irritable bowel syndrome).    PAST SURGICAL HISTORY: Past Surgical History:  Procedure Laterality Date  . HYSTEROSCOPY    . MYOMECTOMY      FAMILY HISTORY: family history includes Breast cancer in her  mother.  SOCIAL HISTORY:  reports that she has never smoked. She has never used smokeless tobacco. She reports that she drinks alcohol. She reports that she does not use drugs. The patient currently resides in [place], and reports she would stay with her parents in [place] during her course of treatment as needed.  ALLERGIES: Amoxicillin  MEDICATIONS:  Current Outpatient Prescriptions  Medication Sig Dispense Refill  . docusate sodium (COLACE) 100 MG capsule Take 100 mg by mouth 2 (two) times daily.    Marland Kitchen OVER THE COUNTER MEDICATION Take 7 tablets by mouth 2 (two) times daily.    Marland Kitchen PRESCRIPTION MEDICATION Take 1 tablet by mouth daily.     No current facility-administered medications for this encounter.     REVIEW OF SYSTEMS: A 10+ POINT REVIEW OF SYSTEMS WAS OBTAINED including neurology, dermatology, psychiatry, cardiac, respiratory, lymph, extremities, GI, GU, Musculoskeletal, constitutional, breasts, reproductive, HEENT.  All pertinent positives are noted in the HPI.  All others are negative.   PHYSICAL EXAM:   Vitals with Age-Percentiles 04/27/2016  Length 453.6 cm  Systolic 468  Diastolic 55  Pulse 63  Respiration 20  Weight 63.776 kg  BMI 24.2  VISIT REPORT    General: Alert and oriented, in no acute distress. HEENT: Oropharynx and oral cavity are clear. Neck: Neck is supple, no palpable cervical or supraclavicular lymphadenopathy or masses. Heart: Regular in rate and rhythm with no murmurs. Chest: Clear to auscultation bilaterally. Abdomen: Soft, nontender, nondistended. Extremities: No edema. Lymphatics: see Neck Exam Skin: see Breast Exam  Neurologic: No obvious focalities. Speech is fluent. Coordination is intact. Psychiatric: Judgment and insight are intact. Affect is appropriate. Breasts: On examination of the right breast, at the 12 o'clock region of the breast there is a mass approximately 2.5 - 3 cm in greatest dimension, part of which may be biopsy artifact. No  palpable masses in the right axillary region. On examination of the left breast, no palpable masses in the breast or axilla are noted. The patient does have dense breast tissue bilaterally.  ECOG = 0  0 - Asymptomatic (Fully active, able to carry on all predisease activities without restriction)  1 - Symptomatic but completely ambulatory (Restricted in physically strenuous activity but ambulatory and able to carry out work of a light or sedentary nature. For example, light housework, office work)  2 - Symptomatic, <50% in bed during the day (Ambulatory and capable of all self care but unable to carry out any work activities. Up and about more than 50% of waking hours)  3 - Symptomatic, >50% in bed, but not bedbound (Capable of only limited self-care, confined to bed or chair 50% or more of waking hours)  4 - Bedbound (Completely disabled. Cannot carry on any self-care. Totally confined to bed or chair)  5 - Death   Eustace Pen MM, Creech RH, Tormey DC, et al. [phone]). "Toxicity and response criteria of the [facility]". [place] Oncol. 5 (6): 649-55   LABORATORY DATA:  Lab Results  Component Value Date   WBC 4.1 04/27/2016   HGB 14.1 04/27/2016   HCT 40.9 04/27/2016   MCV 92.7 04/27/2016   PLT 218 04/27/2016   CMP     Component Value Date/Time   NA 142 04/27/2016 0833   K 3.9 04/27/2016 0833   CO2 25 04/27/2016 0833   GLUCOSE 87 04/27/2016 0833   BUN 9.1 04/27/2016 0833   CREATININE 0.8 04/27/2016 0833   CALCIUM 9.5 04/27/2016 0833   PROT 7.2 04/27/2016 0833   ALBUMIN 3.8 04/27/2016 0833   AST 27 04/27/2016 0833   ALT 12 04/27/2016 0833   ALKPHOS 38 (L) 04/27/2016 0833   BILITOT 0.35 04/27/2016 0833         RADIOGRAPHY: US Breast Ltd Uni Right Inc Axilla  Result Date: 04/21/2016 CLINICAL DATA:  The patient was called back from screening mammography in December 2018 for suspicious mass. EXAM: 2D DIGITAL DIAGNOSTIC RIGHT MAMMOGRAM WITH CAD AND  ADJUNCT TOMO ULTRASOUND RIGHT BREAST COMPARISON:  Previous exam(s). ACR Breast Density Category c: The breast tissue is heterogeneously dense, which may obscure small masses. FINDINGS: There is an irregular spiculated mass containing calcifications in the right breast measuring 1.8 cm mammographically. Mammographic images were processed with CAD. On physical exam, no suspicious lumps identified. Targeted ultrasound is performed, showing an irregular hypoechoic mass at 12:30, 4 cm from the nipple measuring 2.2 x 1.5 x 1.7 cm containing calcifications and internal blood flow. No abnormal lymph nodes in the axilla. IMPRESSION: Highly suspicious right breast mass RECOMMENDATION: Recommend ultrasound-guided biopsy of the highly suspicious right breast mass I have discussed the findings and recommendations with the patient. Results were also provided in writing at the conclusion of the visit. If applicable, a reminder letter will be sent to the patient regarding the next appointment. BI-RADS CATEGORY  4: Suspicious. Electronically Signed   By: Dorise Bullion III M.D   On: 04/21/2016 14:51   Mm Diag Breast Tomo Uni Right  Result Date: 04/21/2016 CLINICAL DATA:  The patient  was called back from screening mammography in December 2018 for suspicious mass. EXAM: 2D DIGITAL DIAGNOSTIC RIGHT MAMMOGRAM WITH CAD AND ADJUNCT TOMO ULTRASOUND RIGHT BREAST COMPARISON:  Previous exam(s). ACR Breast Density Category c: The breast tissue is heterogeneously dense, which may obscure small masses. FINDINGS: There is an irregular spiculated mass containing calcifications in the right breast measuring 1.8 cm mammographically. Mammographic images were processed with CAD. On physical exam, no suspicious lumps identified. Targeted ultrasound is performed, showing an irregular hypoechoic mass at 12:30, 4 cm from the nipple measuring 2.2 x 1.5 x 1.7 cm containing calcifications and internal blood flow. No abnormal lymph nodes in the axilla.  IMPRESSION: Highly suspicious right breast mass RECOMMENDATION: Recommend ultrasound-guided biopsy of the highly suspicious right breast mass I have discussed the findings and recommendations with the patient. Results were also provided in writing at the conclusion of the visit. If applicable, a reminder letter will be sent to the patient regarding the next appointment. BI-RADS CATEGORY  4: Suspicious. Electronically Signed   By: Dorise Bullion III M.D   On: 04/21/2016 14:51   Mm Clip Placement Right  Result Date: 04/21/2016 CLINICAL DATA:  Post biopsy mammogram of the right breast for clip placement. EXAM: DIAGNOSTIC RIGHT MAMMOGRAM POST ULTRASOUND BIOPSY COMPARISON:  Previous exam(s). FINDINGS: Mammographic images were obtained following ultrasound guided biopsy of right breast mass at 12:30. The ribbon shaped biopsy marking clip is appropriately positioned within the biopsied mass at 12:30. IMPRESSION: Appropriate positioning of the ribbon shaped biopsy marking clip within the mass at 12:30 in the right breast. Final Assessment: Post Procedure Mammograms for Marker Placement Electronically Signed   By: Ammie Ferrier M.D.   On: 04/21/2016 16:34   [place] Rt Breast Bx W Loc Dev 1st Lesion Img Bx Spec US Guide  Addendum Date: 04/22/2016   ADDENDUM REPORT: 04/22/2016 14:43 ADDENDUM: Pathology revealed GRADE II INVASIVE DUCTAL CARCINOMA, DUCTAL CARCINOMA IN SITU, ASSOCIATED CALCIFICATIONS of the Right breast, 12:30 o'clock. This was found to be concordant by Dr. Ammie Ferrier. Pathology results were discussed with the patient by telephone. The patient reported doing well after the biopsy with tenderness at the site. Post biopsy instructions and care were reviewed and questions were answered. The patient was encouraged to call [facility] for any additional concerns. The patient was referred to [facility]  on April 27, 2016. Pathology results reported by Terie Purser, RN on 04/22/2016. Electronically Signed   By: Ammie Ferrier M.D.   On: 04/22/2016 14:43   Result Date: 04/22/2016 CLINICAL DATA:  41 year old female presenting for ultrasound-guided biopsy of a right breast mass. EXAM: ULTRASOUND GUIDED RIGHT BREAST CORE NEEDLE BIOPSY COMPARISON:  Previous exam(s). FINDINGS: I met with the patient and we discussed the procedure of ultrasound-guided biopsy, including benefits and alternatives. We discussed the high likelihood of a successful procedure. We discussed the risks of the procedure, including infection, bleeding, tissue injury, clip migration, and inadequate sampling. Informed written consent was given. The usual time-out protocol was performed immediately prior to the procedure. Using sterile technique and 1% Lidocaine as local anesthetic, under direct ultrasound visualization, a 14 gauge spring-loaded device was used to perform biopsy of a right breast mass at 12:30 using an inferomedial approach. At the conclusion of the procedure a ribbon shaped tissue marker clip was deployed into the biopsy cavity. Follow up 2 view mammogram was performed and dictated separately. IMPRESSION: Ultrasound guided biopsy of  right breast mass at 12:30. No apparent complications. Electronically Signed: By: Ammie Ferrier M.D. On: 04/21/2016 16:31      IMPRESSION/PLAN: Stage IIA Right Breast Invasive Ductal Carcinoma, ER/PR Positive.   She has been discussed at our multidisciplinary tumor board.  The consensus is that she would be a good candidate for breast conservation. I talked to her about the option of a mastectomy and informed her that her expected overall survival would be equivalent between mastectomy and breast conservation, based upon randomized controlled data. She is leaning toward breast conservation. She understands that radiotherapy is warranted adjuvantly after lumpectomy in all cases but after  mastectomy only in selected cases, such as pathologically positive nodes.  Plan for Genetics referral and MRI of breast for complete breast staging. The patient is discussing antiestrogen therapy with medical oncology (she will likely start this soon) and oncotype testing to determine value of systemic chemotherapy. If chemotherapy is given, it will probably be neoadjuvant.    She will see genetic testing today due to young age and family history.  It was a pleasure meeting the patient today. We discussed the risks, benefits, and side effects of radiotherapy. In the setting of breast conservation, I recommend radiotherapy to the Right Breast to reduce her risk of locoregional recurrence by 2/3.  We discussed that radiation would take approximately 6 weeks to complete and that I would give the patient a few weeks to heal following surgery before starting treatment planning. If chemotherapy were to be given, this would precede radiotherapy. We spoke about acute effects including skin irritation and fatigue as well as much less common late effects including internal organ injury or irritation. We spoke about the latest technology that is used to minimize the risk of late effects for patients undergoing radiotherapy to the breast or chest wall. No guarantees of treatment were given. The patient is enthusiastic about proceeding with treatment. I look forward to participating in the patient's care.  I will await her referral back to me for postoperative follow-up and eventual CT simulation/treatment planning. __________________________________________   Eppie Gibson, MD  This document serves as a record of services personally performed by Eppie Gibson, MD. It was created on her behalf by Maryla Morrow, a trained medical scribe. The creation of this record is based on the scribe's personal observations and the provider's statements to them. This document has been checked and approved by the attending  provider.

## 2016-04-28 NOTE — Progress Notes (Signed)
START ON PATHWAY REGIMEN - Breast  BOS176: AC-T - [Doxorubicin + Cyclophosphamide q21 Days x 4 Cycles, Followed by Paclitaxel Weekly x 12 Weeks]  Doxorubicin + Cyclophosphamide (AC):   A cycle is every 21 days:     Doxorubicin (Adriamycin(R)) 60 mg/m2 IV Push followed by Dose Mod: None     Cyclophosphamide (Cytoxan(R)) 600 mg/m2 in 250 mL NS IV over 30 minutes Dose Mod: None  **Always confirm dose/schedule in your pharmacy ordering system**    Paclitaxel 80 mg/m2 Weekly:   Administer weekly:     Paclitaxel (Taxol(R)) 80 mg/m2 in 250 mL NS IV over 1 hour Dose Mod: None  **Always confirm dose/schedule in your pharmacy ordering system**    Patient Characteristics: Adjuvant Therapy, Node Negative, HER2/neu Negative/Unknown/Equivocal, ER Positive, Genomic Testing Not Performed, Chemotherapy Preferred AJCC Stage Grouping: IIA Current Disease Status: No Distant Mets or Local Recurrence AJCC M Stage: 0 ER Status: Positive (+) AJCC N Stage: 0 AJCC T Stage: 2 HER2/neu: Negative (-) PR Status: Positive (+) Node Status: Negative (-) Has this patient completed genomic testing? No - Did Not Order Test  Treatment Preferred: Chemotherapy  Intent of Therapy: Curative Intent, Discussed with Patient

## 2016-04-29 ENCOUNTER — Ambulatory Visit (HOSPITAL_COMMUNITY)
Admission: RE | Admit: 2016-04-29 | Discharge: 2016-04-29 | Disposition: A | Discharge status: Home / Self Care | Payer: Medicaid Other | Source: Ambulatory Visit | Attending: Surgery | Admitting: Surgery

## 2016-04-29 DIAGNOSIS — Z170 Estrogen receptor positive status [ER+]: Secondary | ICD-10-CM | POA: Diagnosis not present

## 2016-04-29 DIAGNOSIS — C50211 Malignant neoplasm of upper-inner quadrant of right female breast: Secondary | ICD-10-CM | POA: Diagnosis not present

## 2016-04-29 DIAGNOSIS — N6313 Unspecified lump in the right breast, lower outer quadrant: Secondary | ICD-10-CM | POA: Diagnosis not present

## 2016-04-29 MED ORDER — GADOBENATE DIMEGLUMINE 529 MG/ML IV SOLN
13.0000 mL | Freq: Once | INTRAVENOUS | Status: AC | PRN
  Administered 2016-04-29: 13 mL via INTRAVENOUS

## 2016-05-02 ENCOUNTER — Telehealth: Payer: Self-pay | Admitting: *Deleted

## 2016-05-02 NOTE — Telephone Encounter (Signed)
Spoke to pt concerning Gisela from 1.31.18. Denies questions or concerns regarding dx or treatment care plan. Encourage pt to call with needs. Received verbal understanding. Contact information provided.

## 2016-05-04 ENCOUNTER — Telehealth: Payer: Self-pay | Admitting: *Deleted

## 2016-05-04 NOTE — Telephone Encounter (Signed)
Discussed with pt that her genetic testing and oncotype testing take approximately 2 wks to return. Gave pt estimated return date of 05/11/16. Received verbal understanding.

## 2016-05-06 ENCOUNTER — Telehealth: Payer: Self-pay | Admitting: *Deleted

## 2016-05-06 NOTE — Telephone Encounter (Signed)
Received Oncotype Score of 14/9%. Physician team notified.

## 2016-05-08 ENCOUNTER — Other Ambulatory Visit: Payer: Self-pay | Admitting: Oncology

## 2016-05-09 ENCOUNTER — Telehealth: Payer: Self-pay | Admitting: Genetic Counselor

## 2016-05-09 ENCOUNTER — Other Ambulatory Visit: Payer: Self-pay | Admitting: Oncology

## 2016-05-09 ENCOUNTER — Encounter: Payer: Self-pay | Admitting: Genetic Counselor

## 2016-05-09 ENCOUNTER — Telehealth: Payer: Self-pay | Admitting: *Deleted

## 2016-05-09 ENCOUNTER — Other Ambulatory Visit: Payer: Self-pay | Admitting: *Deleted

## 2016-05-09 DIAGNOSIS — Z1379 Encounter for other screening for genetic and chromosomal anomalies: Secondary | ICD-10-CM | POA: Insufficient documentation

## 2016-05-09 DIAGNOSIS — R928 Other abnormal and inconclusive findings on diagnostic imaging of breast: Secondary | ICD-10-CM

## 2016-05-09 NOTE — Telephone Encounter (Signed)
Revealed negative genetic testing.  Discussed that we do not know why she has breast cancer or why there is cancer in the family. It could be due to a different gene that we are not testing, or maybe our current technology may not be able to pick something up.  It will be important for her to keep in contact with genetics to keep up with whether additional testing may be needed. 

## 2016-05-09 NOTE — Progress Notes (Signed)
Called pt with oncotype score of 14. Informed per Dr. Jana Hakim pt does not need chemotherapy. Discussed she does need 2 additional MRI bx to right breast if she prefers lumpectomy. Pt relate she would like to pursue additional bx to "save my breast". Informed pt that she will receive a call concerning appts for bx. Received verbal understanding.

## 2016-05-09 NOTE — Telephone Encounter (Signed)
Schedule and confirmed MRI of right breast on 05/12/16. Gave pt instructions and directions. Denies further needs at this time.

## 2016-05-11 ENCOUNTER — Other Ambulatory Visit: Payer: Self-pay

## 2016-05-12 ENCOUNTER — Ambulatory Visit
Admission: RE | Admit: 2016-05-12 | Discharge: 2016-05-12 | Disposition: A | Discharge status: Home / Self Care | Payer: No Typology Code available for payment source | Source: Ambulatory Visit | Attending: Oncology | Admitting: Oncology

## 2016-05-12 ENCOUNTER — Other Ambulatory Visit: Payer: Self-pay

## 2016-05-12 ENCOUNTER — Other Ambulatory Visit: Payer: No Typology Code available for payment source

## 2016-05-12 ENCOUNTER — Ambulatory Visit: Payer: Self-pay | Admitting: Genetic Counselor

## 2016-05-12 DIAGNOSIS — R928 Other abnormal and inconclusive findings on diagnostic imaging of breast: Secondary | ICD-10-CM

## 2016-05-12 DIAGNOSIS — Z800 Family history of malignant neoplasm of digestive organs: Secondary | ICD-10-CM

## 2016-05-12 DIAGNOSIS — Z170 Estrogen receptor positive status [ER+]: Secondary | ICD-10-CM

## 2016-05-12 DIAGNOSIS — Z8041 Family history of malignant neoplasm of ovary: Secondary | ICD-10-CM

## 2016-05-12 DIAGNOSIS — C50211 Malignant neoplasm of upper-inner quadrant of right female breast: Secondary | ICD-10-CM

## 2016-05-12 DIAGNOSIS — Z1379 Encounter for other screening for genetic and chromosomal anomalies: Secondary | ICD-10-CM

## 2016-05-12 DIAGNOSIS — Z803 Family history of malignant neoplasm of breast: Secondary | ICD-10-CM

## 2016-05-12 DIAGNOSIS — Z8042 Family history of malignant neoplasm of prostate: Secondary | ICD-10-CM

## 2016-05-12 MED ORDER — GADOBENATE DIMEGLUMINE 529 MG/ML IV SOLN
13.0000 mL | Freq: Once | INTRAVENOUS | Status: AC | PRN
  Administered 2016-05-12: 13 mL via INTRAVENOUS

## 2016-05-12 NOTE — Progress Notes (Signed)
HPI: Candice [name] was previously seen in the [facility] due to a personal and family history of breast cancer and concerns regarding a hereditary predisposition to cancer. Please refer to our prior cancer genetics clinic note for more information regarding Candice [name]'s medical, social and family histories, and our assessment and recommendations, at the time. Candice [name] recent genetic test results were disclosed to her, as were recommendations warranted by these results. These results and recommendations are discussed in more detail below.  CANCER HISTORY:    Malignant neoplasm of upper-inner quadrant of right breast in female, estrogen receptor positive ([place])   04/27/2016 Initial Diagnosis    Malignant neoplasm of upper-inner quadrant of right breast in female, estrogen receptor positive ([place])     05/09/2016 Genetic Testing    Patient has genetic testing done for personal and family history of breast cancer. Negative genetic testing.  The [facility] gene panel offered by [employer] includes sequencing and deletion/duplication testing of the following 28 genes: APC, ATM, BARD1, BMPR1A, BRCA1, BRCA2, BRIP1, CHD1, CDK4, CDKN2A, CHEK2, EPCAM (large rearrangement only), MLH1, MSH2, MSH6, MUTYH, NBN, PALB2, PMS2, PTEN, RAD51C, RAD51D, SMAD4, STK11, and TP53. Sequencing was performed for select regions of POLE and POLD1, and large rearrangement analysis was performed for select regions of GREM1.        FAMILY HISTORY:  We obtained a detailed, 4-generation family history.  Significant diagnoses are listed below: Family History  Problem Relation Age of Onset  . Breast cancer Mother 67  . Pancreatic cancer Paternal Grandmother   . Prostate cancer Paternal Grandfather   . Ovarian cancer Other     MGMs sister  . Breast cancer Other     mother's maternal first cousin    The patient does not have children.  She has one sister who is cancer free.  Her mother  had breast cancer at age 37.  Her mother is an only child.  Her maternal grandparents are deceased.  Her grandfather died in a car accident and her grandmother died at 32.  Her grandmother was one of 14 children.  One sister had an unknown cancer and another sister had ovarian cancer. This sister had a daughter with breast cancer.  The patient's father has three brothers and three sisters, none who had cancer.  Her paternal grandmother had pancreatic cancer and her grandfather had prostate cancer.    Candice [name] is unaware of previous family history of genetic testing for hereditary cancer risks. Patient's maternal ancestors are of [place], [place], [place], [place] descent, and paternal ancestors are of [place], [place] descent. There is no reported Ashkenazi Jewish ancestry. There is no known consanguinity.  GENETIC TEST RESULTS: Genetic testing reported out on May 09, 2016 through the [facility] cancer panel found no deleterious mutations.  The [facility] gene panel offered by [employer] includes sequencing and deletion/duplication testing of the following 28 genes: APC, ATM, BARD1, BMPR1A, BRCA1, BRCA2, BRIP1, CHD1, CDK4, CDKN2A, CHEK2, EPCAM (large rearrangement only), MLH1, MSH2, MSH6, MUTYH, NBN, PALB2, PMS2, PTEN, RAD51C, RAD51D, SMAD4, STK11, and TP53. Sequencing was performed for select regions of POLE and POLD1, and large rearrangement analysis was performed for select regions of GREM1.   The test report has been scanned into [employer] and is located under the Molecular Pathology section of the Results Review tab.   We discussed with Ms. Chieffo that since the current genetic testing is not perfect, it is possible there may be a gene  mutation in one of these genes that current testing cannot detect, but that chance is small. We also discussed, that it is possible that another gene that has not yet been discovered, or that we have not yet tested, is  responsible for the cancer diagnoses in the family, and it is, therefore, important to remain in touch with cancer genetics in the future so that we can continue to offer Ms. Murillo the most up to date genetic testing.   CANCER SCREENING RECOMMENDATIONS:  Given Candice [name]'s personal and family histories, we must interpret these negative results with some caution.  Families with features suggestive of hereditary risk for cancer tend to have multiple family members with cancer, diagnoses in multiple generations and diagnoses before the age of 18. Candice [name] family exhibits some of these features. Thus this result may simply reflect our current inability to detect all mutations within these genes or there may be a different gene that has not yet been discovered or tested.   RECOMMENDATIONS FOR FAMILY MEMBERS: Women in this family might be at some increased risk of developing cancer, over the general population risk, simply due to the family history of cancer. We recommended women in this family have a yearly mammogram beginning at age 66, or 58 years younger than the earliest onset of cancer, an annual clinical breast exam, and perform monthly breast self-exams. Women in this family should also have a gynecological exam as recommended by their primary provider. All family members should have a colonoscopy by age 86.  FOLLOW-UP: Lastly, we discussed with [name] that cancer genetics is a rapidly advancing field and it is possible that new genetic tests will be appropriate for her and/or her family members in the future. We encouraged her to remain in contact with cancer genetics on an annual basis so we can update her personal and family histories and let her know of advances in cancer genetics that may benefit this family.   Our contact number was provided. [name] questions were answered to her satisfaction, and she knows she is welcome to call us at anytime with additional questions or  concerns.   Roma Kayser, MS, [facility] Certified Genetic Counselor [email or web address]

## 2016-05-13 ENCOUNTER — Ambulatory Visit (HOSPITAL_BASED_OUTPATIENT_CLINIC_OR_DEPARTMENT_OTHER): Payer: Medicaid Other | Admitting: Oncology

## 2016-05-13 ENCOUNTER — Encounter: Payer: Self-pay | Admitting: Oncology

## 2016-05-13 VITALS — BP 123/67 | HR 78 | Temp 98.2°F | Resp 20 | Ht 64.0 in | Wt 140.0 lb

## 2016-05-13 DIAGNOSIS — C50211 Malignant neoplasm of upper-inner quadrant of right female breast: Secondary | ICD-10-CM

## 2016-05-13 DIAGNOSIS — Z170 Estrogen receptor positive status [ER+]: Secondary | ICD-10-CM | POA: Diagnosis not present

## 2016-05-13 DIAGNOSIS — Z79810 Long term (current) use of selective estrogen receptor modulators (SERMs): Secondary | ICD-10-CM | POA: Diagnosis not present

## 2016-05-13 NOTE — Progress Notes (Signed)
Met with uninsured patient and family regarding financial assistance. Patient said she was told she may be eligible for [facility] Medicaid. Patient states she is not going to have to do chemo just surgery and Radiation. Advised patient she would need to contact the surgeon's office to find out if the [facility] Medicaid is accepted for their services as well as Radiation whom bills separately. Patient wanted to know about [employer]. Advised patient after surgery and Radiation will begin, she may provide proof of income to apply for [employer]. Patient states she has no income and had to move back home with her parents temporarily. Advised patient a letter of support from her parents would work for income verification. Patient has my card for any additional financial questions or concerns.

## 2016-05-13 NOTE — Progress Notes (Signed)
[facility]  [phone] Fax:(336) [phone]     ID: LUTIE PICKLER DOB: 1975/12/07  MR#: [id number]  [id number]#:[id number]  Patient Care Team: No Pcp Per Patient as PCP - General (General Practice) Alphonsa Overall, MD as Consulting Physician (General Surgery) Chauncey Cruel, MD as Consulting Physician (Oncology) Eppie Gibson, MD as Attending Physician (Radiation Oncology) Osborne Oman, MD as Attending Physician (Obstetrics and Gynecology) Chauncey Cruel, MD OTHER MD:  CHIEF COMPLAINT: estrogen receptor positive breast cancer  CURRENT TREATMENT: Tamoxifen; Definitive surgery pending   BREAST CANCER HISTORY: From the original intake note:  Nasrin was evaluated [facility] the [facility] [place] in December, at which time a mass in the right breast was noted. She was referred for B[facility]CP and screening mammography was obtained, showing a possible mass in the right breast.on 04/21/2016 she underwent right diagnostic mammography with tomography and ultrasonography at the breast Center, and this confirmedan irregular spiculat in the right breast measuring 1.8 cm, which was not palpable to the mammographer. Ultrasonography confirmed an irregular hypoechoic right breast mass at the 12:30 o'clock position 4 cm from the nipple measuring 2.2 cm. The right axilla was sonographically benign.  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  Her subsequent history is detailed below  INTERVAL HISTORY: Tacy returns today for follow-up of her estrogen receptor positive breast cancer accompanied by her sister and another relative. Since her last visit here she had genetics testing which found no deleterious  mutations in a broad panel. We also obtained an Oncotype test from her original biopsy. This found a score of 14, predicting a 10 year risk of recurrence outside the breast of 9% if the patient's only systemic therapy is tamoxifen for 5 years. It also predicts no benefit from chemotherapy.  She also had a breast MRI which showed 2 additional masses of concern. 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  She is tolerating tamoxifen well, with some mild hot flashes. She still having periods although slightly irregular. She obtains the drug at $10 per month.  REVIEW OF SYSTEMS: Denis  has a long history of constipation which has been extensively evaluated since 2016 with at least 3 different gastroenterologists  and multiple imaging modalities and colonoscopy 2016. Basically no specific etiology was found and she manages this with colon cleanse daily as well as occasional stool softeners. Aside from these issues a detailed review of systems today was entirely benign   PAST MEDICAL HISTORY: Past Medical History:  Diagnosis Date  . Family history of breast cancer   . Family history of ovarian cancer   . Family history of pancreatic cancer   . Family history of prostate cancer   . IBS (irritable bowel syndrome)     PAST SURGICAL HISTORY: Past Surgical History:  Procedure Laterality Date  . HYSTEROSCOPY    . MYOMECTOMY      FAMILY HISTORY Family History  Problem Relation Age of Onset  . Breast cancer Mother 25  . Pancreatic cancer Paternal Grandmother   . Prostate cancer Paternal Grandfather   . Ovarian cancer Other     MGMs sister  . Breast cancer Other     mother's maternal first cousin  The patient's father, [name], lives in [place] and works as a Geneticist, molecular. The patient's mother is a Quarry manager. The patient has no brother, one sister, who works in [place] as a Network engineer. The patient's mother had breast  cancer, stage 0, diagnosed at age 17. There are 2 maternal relatives (mother's sister and mother's niece) with breast cancer, both postmenopausal.  GYNECOLOGIC HISTORY:  Menarche age 41, the patient is GX P0. She has regular periods, most recently mid-January. She is on oral contraceptives at present. No LMP recorded.   SOCIAL HISTORY:  The patient has a PhD degree and has worked for [employer] here and in [place] but is currently unemployed.  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 She generally lives in [place] by herself although she is currently staying at her parents.    ADVANCED DIRECTIVES: Not in place. The patient tells me she intends to name her sister as her healthcare power of attorney.   HEALTH MAINTENANCE: Social History  Substance Use Topics  . Smoking status: Never Smoker  . Smokeless tobacco: Never Used  . Alcohol use Yes     Comment: RARELY     Colonoscopy: July 2016, in [place]  PAP:  Bone density: Never   Allergies  Allergen Reactions  . Amoxicillin Rash    Current Outpatient Prescriptions  Medication Sig Dispense Refill  . docusate sodium (COLACE) 100 MG capsule Take 100 mg by mouth 2 (two) times daily.    Marland Kitchen OVER THE COUNTER MEDICATION Take 7 tablets by mouth 2 (two) times daily.    Marland Kitchen PRESCRIPTION MEDICATION Take 1 tablet by mouth daily.    . tamoxifen (NOLVADEX) 20 MG tablet Take 1 tablet (20 mg total) by mouth daily. 30 tablet 4   No current facility-administered medications for this visit.     OBJECTIVE: Young-appearing African-American woman Who appears well Vitals:   05/13/16 1510  BP: 123/67  Pulse: 78  Resp: 20  Temp: 98.2 F (36.8 C)     Body mass index is 24.03 kg/m.    ECOG FS:0 - Asymptomatic  Sclerae unicteric, pupils round and equal Oropharynx clear and moist-- no thrush or other lesions No cervical or supraclavicular adenopathy Lungs no rales or rhonchi Heart regular rate and rhythm Abd soft,  nontender, positive bowel sounds MSK no focal spinal tenderness, no upper extremity lymphedema Neuro: nonfocal, well oriented, appropriate affect Breasts: Deferred  LAB RESULTS:  CMP     Component Value Date/Time   NA 142 04/27/2016 0833   K 3.9 04/27/2016 0833   CO2 25 04/27/2016 0833   GLUCOSE 87 04/27/2016 0833   BUN 9.1 04/27/2016 0833   CREATININE 0.8 04/27/2016 0833   CALCIUM 9.5 04/27/2016 0833   PROT 7.2 04/27/2016 0833   ALBUMIN 3.8 04/27/2016 0833   AST 27 04/27/2016 0833   ALT 12 04/27/2016 0833   ALKPHOS 38 (L) 04/27/2016 0833   BILITOT 0.35 04/27/2016 0833    INo results found for: SPEP, UPEP  Lab Results  Component Value Date   WBC 4.1 04/27/2016   NEUTROABS 2.1 04/27/2016   HGB 14.1 04/27/2016   HCT 40.9 04/27/2016   MCV 92.7 04/27/2016   PLT 218 04/27/2016      Chemistry      Component Value Date/Time   NA 142 04/27/2016 0833   K 3.9 04/27/2016 0833   CO2 25 04/27/2016 0833   BUN 9.1 04/27/2016 0833   CREATININE 0.8 04/27/2016 0370  Component Value Date/Time   CALCIUM 9.5 04/27/2016 0833   ALKPHOS 38 (L) 04/27/2016 0833   AST 27 04/27/2016 0833   ALT 12 04/27/2016 0833   BILITOT 0.35 04/27/2016 0833       No results found for: LABCA2  No components found for: DZHGD924  No results for input(s): INR in the last 168 hours.  Urinalysis No results found for: COLORURINE, APPEARANCEUR, LABSPEC, PHURINE, GLUCOSEU, HGBUR, BILIRUBINUR, KETONESUR, PROTEINUR, UROBILINOGEN, NITRITE, LEUKOCYTESUR   STUDIES: Mr Breast Bilateral W Wo Contrast  Result Date: 05/02/2016 CLINICAL DATA:  Patient with recent diagnosis grade 2 invasive ductal carcinoma right breast. EXAM: BILATERAL BREAST MRI WITH AND WITHOUT CONTRAST TECHNIQUE: Multiplanar, multisequence MR images of both breasts were obtained prior to and following the intravenous administration of 13 ml of MultiHance. THREE-DIMENSIONAL MR IMAGE RENDERING ON INDEPENDENT WORKSTATION: Three-dimensional  MR images were rendered by post-processing of the original MR data on an independent workstation. The three-dimensional MR images were interpreted, and findings are reported in the following complete MRI report for this study. Three dimensional images were evaluated at the independent DynaCad workstation COMPARISON:  Previous exam(s). FINDINGS: Breast composition: c.  Heterogeneous fibroglandular tissue. Background parenchymal enhancement: Moderate. Right breast: Within the 1230 o'clock right breast posterior depth there is a 2.1 x 1.9 x 3.1 cm irregular enhancing mass compatible with biopsy-proven carcinoma. Additionally, within the lower outer right breast there is a 1.0 x 0.8 x 0.9 cm lobular enhancing mass. Additionally within the lateral aspect of the right breast middle depth there is a 1.0 x 0.4 x 0.6 cm lobular enhancing mass. Scattered foci are demonstrated within the right breast. Left breast: No mass or abnormal enhancement. Lymph nodes: Symmetric appearing lymph nodes bilaterally. Ancillary findings:  None. IMPRESSION: Irregular enhancing mass with biopsy marking clip within the 1230 o'clock right breast compatible with biopsy proven carcinoma. Two additional masses within the lower outer right breast concerning for additional sites of disease. RECOMMENDATION: MRI guided core needle biopsy of additional right breast masses as described if needed for definitive diagnosis for treatment planning. BI-RADS CATEGORY  4: Suspicious. Electronically Signed   By: Lovey Newcomer M.D.   On: 05/02/2016 08:42   US Breast Ltd Uni Right Inc Axilla  Result Date: 04/21/2016 CLINICAL DATA:  The patient was called back from screening mammography in December 2018 for suspicious mass. EXAM: 2D DIGITAL DIAGNOSTIC RIGHT MAMMOGRAM WITH CAD AND ADJUNCT TOMO ULTRASOUND RIGHT BREAST COMPARISON:  Previous exam(s). ACR Breast Density Category c: The breast tissue is heterogeneously dense, which may obscure small masses. FINDINGS:  There is an irregular spiculated mass containing calcifications in the right breast measuring 1.8 cm mammographically. Mammographic images were processed with CAD. On physical exam, no suspicious lumps identified. Targeted ultrasound is performed, showing an irregular hypoechoic mass at 12:30, 4 cm from the nipple measuring 2.2 x 1.5 x 1.7 cm containing calcifications and internal blood flow. No abnormal lymph nodes in the axilla. IMPRESSION: Highly suspicious right breast mass RECOMMENDATION: Recommend ultrasound-guided biopsy of the highly suspicious right breast mass I have discussed the findings and recommendations with the patient. Results were also provided in writing at the conclusion of the visit. If applicable, a reminder letter will be sent to the patient regarding the next appointment. BI-RADS CATEGORY  4: Suspicious. Electronically Signed   By: Dorise Bullion III M.D   On: 04/21/2016 14:51   Mm Diag Breast Tomo Uni Right  Result Date: 04/21/2016 CLINICAL DATA:  The patient was called back from  screening mammography in December 2018 for suspicious mass. EXAM: 2D DIGITAL DIAGNOSTIC RIGHT MAMMOGRAM WITH CAD AND ADJUNCT TOMO ULTRASOUND RIGHT BREAST COMPARISON:  Previous exam(s). ACR Breast Density Category c: The breast tissue is heterogeneously dense, which may obscure small masses. FINDINGS: There is an irregular spiculated mass containing calcifications in the right breast measuring 1.8 cm mammographically. Mammographic images were processed with CAD. On physical exam, no suspicious lumps identified. Targeted ultrasound is performed, showing an irregular hypoechoic mass at 12:30, 4 cm from the nipple measuring 2.2 x 1.5 x 1.7 cm containing calcifications and internal blood flow. No abnormal lymph nodes in the axilla. IMPRESSION: Highly suspicious right breast mass RECOMMENDATION: Recommend ultrasound-guided biopsy of the highly suspicious right breast mass I have discussed the findings and  recommendations with the patient. Results were also provided in writing at the conclusion of the visit. If applicable, a reminder letter will be sent to the patient regarding the next appointment. BI-RADS CATEGORY  4: Suspicious. Electronically Signed   By: Dorise Bullion III M.D   On: 04/21/2016 14:51   Mm Clip Placement Right  Result Date: 05/12/2016 CLINICAL DATA:  Post MRI guided core needle biopsy of 2 EXAM: DIAGNOSTIC RIGHT MAMMOGRAM POST MRI BIOPSY COMPARISON:  Previous exam(s). FINDINGS: Mammographic images were obtained following MRI guided biopsy of of 2 right breast lower outer quadrant enhancing lesions. Dumbbell-shaped marker is located 6 mm anteromedial to the first biopsy site in the lower slightly outer quadrant of the right breast, and cylindrical marker is located at the biopsy site of the second slightly lower outer quadrant biopsy site. IMPRESSION: Successful placement of 2 post biopsy tissue markers, post MRI guided core needle biopsy of the right breast: First and more inferior lesion associated with dumbbell-shaped marker. Second and more superior lesion associated with cylindrical marker. Final Assessment: Post Procedure Mammograms for Marker Placement Electronically Signed   By: Fidela Salisbury M.D.   On: 05/12/2016 10:25   Mm Clip Placement Right  Result Date: 04/21/2016 CLINICAL DATA:  Post biopsy mammogram of the right breast for clip placement. EXAM: DIAGNOSTIC RIGHT MAMMOGRAM POST ULTRASOUND BIOPSY COMPARISON:  Previous exam(s). FINDINGS: Mammographic images were obtained following ultrasound guided biopsy of right breast mass at 12:30. The ribbon shaped biopsy marking clip is appropriately positioned within the biopsied mass at 12:30. IMPRESSION: Appropriate positioning of the ribbon shaped biopsy marking clip within the mass at 12:30 in the right breast. Final Assessment: Post Procedure Mammograms for Marker Placement Electronically Signed   By: Ammie Ferrier M.D.    On: 04/21/2016 16:34   [place] Rt Breast Bx W Loc Dev 1st Lesion Img Bx Spec US Guide  Addendum Date: 04/22/2016   ADDENDUM REPORT: 04/22/2016 14:43 ADDENDUM: Pathology revealed GRADE II INVASIVE DUCTAL CARCINOMA, DUCTAL CARCINOMA IN SITU, ASSOCIATED CALCIFICATIONS of the Right breast, 12:30 o'clock. This was found to be concordant by Dr. Ammie Ferrier. Pathology results were discussed with the patient by telephone. The patient reported doing well after the biopsy with tenderness at the site. Post biopsy instructions and care were reviewed and questions were answered. The patient was encouraged to call [facility] for any additional concerns. The patient was referred to [facility] on April 27, 2016. Pathology results reported by Terie Purser, RN on 04/22/2016. Electronically Signed   By: Ammie Ferrier M.D.   On: 04/22/2016 14:43   Result Date: 04/21/2016 CLINICAL DATA:  41 year old female presenting for  ultrasound-guided biopsy of a right breast mass. EXAM: ULTRASOUND GUIDED RIGHT BREAST CORE NEEDLE BIOPSY COMPARISON:  Previous exam(s). FINDINGS: I met with the patient and we discussed the procedure of ultrasound-guided biopsy, including benefits and alternatives. We discussed the high likelihood of a successful procedure. We discussed the risks of the procedure, including infection, bleeding, tissue injury, clip migration, and inadequate sampling. Informed written consent was given. The usual time-out protocol was performed immediately prior to the procedure. Using sterile technique and 1% Lidocaine as local anesthetic, under direct ultrasound visualization, a 14 gauge spring-loaded device was used to perform biopsy of a right breast mass at 12:30 using an inferomedial approach. At the conclusion of the procedure a ribbon shaped tissue marker clip was deployed into the biopsy cavity. Follow up 2 view  mammogram was performed and dictated separately. IMPRESSION: Ultrasound guided biopsy of right breast mass at 12:30. No apparent complications. Electronically Signed: By: Ammie Ferrier M.D. On: 04/21/2016 16:31   Mr Rt Breast Bx Johnella Moloney Dev 1st Lesion Image Bx Spec Mr Guide  Result Date: 05/12/2016 CLINICAL DATA:  Two right breast lower outer quadrant enhancing masses seen on MRI. Patient has new diagnosis of right breast cancer. EXAM: MRI GUIDED CORE NEEDLE BIOPSY OF THE RIGHT BREAST TECHNIQUE: Multiplanar, multisequence MR imaging of the right breast was performed both before and after administration of intravenous contrast. CONTRAST:  94m MULTIHANCE GADOBENATE DIMEGLUMINE 529 MG/ML IV SOLN COMPARISON:  Previous exams. FINDINGS: I met with the patient, and we discussed the procedure of MRI guided biopsy, including risks, benefits, and alternatives. Specifically, we discussed the risks of infection, bleeding, tissue injury, clip migration, and inadequate sampling. Informed, written consent was given. The usual time out protocol was performed immediately prior to the procedure. Using sterile technique, 1% Lidocaine, MRI guidance, and a 9 gauge vacuum assisted device, biopsy was performed of right breast lower slightly outer quadrant mass using a lateral approach. At the conclusion of the procedure, a dumbbell-shaped tissue marker clip was deployed into the biopsy cavity. Next, using sterile technique, 1% Lidocaine, MRI guidance, and a 9 gauge vacuum assisted device, biopsy was performed of right breast lower outer quadrant mass, more superior to the first mass using a lateral approach. At the conclusion of the procedure, a cylinder shaped tissue marker clip was deployed into the biopsy cavity. Follow-up 2-view mammogram was performed and dictated separately. IMPRESSION: MRI guided biopsy of 2 right breast lower outer quadrant masses. No apparent complications. Electronically Signed   By: DFidela Salisbury M.D.   On: 05/12/2016 10:05   Mr Rt Breast Bx W Loc Dev Ea Add Lesion Image Bx Spec Mr Guide  Result Date: 05/12/2016 CLINICAL DATA:  Two right breast lower outer quadrant enhancing masses seen on MRI. Patient has new diagnosis of right breast cancer. EXAM: MRI GUIDED CORE NEEDLE BIOPSY OF THE RIGHT BREAST TECHNIQUE: Multiplanar, multisequence MR imaging of the right breast was performed both before and after administration of intravenous contrast. CONTRAST:  185mMULTIHANCE GADOBENATE DIMEGLUMINE 529 MG/ML IV SOLN COMPARISON:  Previous exams. FINDINGS: I met with the patient, and we discussed the procedure of MRI guided biopsy, including risks, benefits, and alternatives. Specifically, we discussed the risks of infection, bleeding, tissue injury, clip migration, and inadequate sampling. Informed, written consent was given. The usual time out protocol was performed immediately prior to the procedure. Using sterile technique, 1% Lidocaine, MRI guidance, and a 9 gauge vacuum assisted device, biopsy was performed of right breast lower slightly outer  quadrant mass using a lateral approach. At the conclusion of the procedure, a dumbbell-shaped tissue marker clip was deployed into the biopsy cavity. Next, using sterile technique, 1% Lidocaine, MRI guidance, and a 9 gauge vacuum assisted device, biopsy was performed of right breast lower outer quadrant mass, more superior to the first mass using a lateral approach. At the conclusion of the procedure, a cylinder shaped tissue marker clip was deployed into the biopsy cavity. Follow-up 2-view mammogram was performed and dictated separately. IMPRESSION: MRI guided biopsy of 2 right breast lower outer quadrant masses. No apparent complications. Electronically Signed   By: Fidela Salisbury M.D.   On: 05/12/2016 10:05    ELIGIBLE FOR AVAILABLE RESEARCH PROTOCOL: *no  ASSESSMENT: 41 y.o. [place] woman currently residing in [place], status post right  breast upper inner quadrant biopsy 04/21/2016 for a clinical T2 N0, stage IIa invasive ductal carcinoma, grade 2, estrogen and progesterone receptor positive, HER-2 nonamplified, with an MIB-1 of 20%  (a) biopsy of 2 additional suspicious areas in the right breast 05/12/2016 was benign  (1) started tamoxifen 04/27/2016   (2) Oncotype DX obtained from the original biopsy showed a score of 14, predicting a 10 year risk of recurrence outside the breast of 9% if the patient's only systemic therapy is tamoxifen for 5 years. It also predicts no benefit from chemotherapy  (3) patient is not interested in fertility preservation  (4) genetics testing 04/27/2016 through the [facility] gene panel offered by [facility] found no deleterious mutations in APC, ATM, BARD1, BMPR1A, BRCA1, BRCA2, BRIP1, CHD1, CDK4, CDKN2A, CHEK2, EPCAM (large rearrangement only), MLH1, MSH2, MSH6, MUTYH, NBN, PALB2, PMS2, PTEN, RAD51C, RAD51D, SMAD4, STK11, and TP53. Sequencing was performed for select regions of POLE and POLD1, and large rearrangement analysis was performed for select regions of GREM1.   (5) definitive surgery to follow  (6) adjuvant radiation as appropriate  PLAN: I reviewed results of the MRI and biopsies with the patient and her family. She is now ready to proceed to breast conserving surgery which is her choice. She knows this will need to be followed by radiation.  We reviewed her Oncotype results which are surprisingly favorable given her age. They tell us that she would not benefit from chemotherapy and does not need chemotherapy to achieve a high cure rate.  She is tolerating tamoxifen well. I am comfortable with her continuing tamoxifen right through her local treatments but if her surgeon or radiation oncology wish her to drop tamoxifen for a brief period I have no problem with that as well  Accordingly she will return to see me in approximately 6 weeks. She should be done with surgery  and getting close to finishing her radiation by that time. She knows to call for any problems that may develop before her next visit here.   :Chauncey Cruel, MD   05/14/2016 8:30 AM Medical Oncology and Hematology [facility] [place], [place] [address] Tel. [phone]    Fax. [phone]

## 2016-05-16 ENCOUNTER — Other Ambulatory Visit: Payer: Self-pay | Admitting: Surgery

## 2016-05-16 DIAGNOSIS — Z170 Estrogen receptor positive status [ER+]: Principal | ICD-10-CM

## 2016-05-16 DIAGNOSIS — C50911 Malignant neoplasm of unspecified site of right female breast: Secondary | ICD-10-CM

## 2016-05-24 ENCOUNTER — Other Ambulatory Visit: Payer: Self-pay | Admitting: Surgery

## 2016-05-24 DIAGNOSIS — Z170 Estrogen receptor positive status [ER+]: Principal | ICD-10-CM

## 2016-05-24 DIAGNOSIS — C50911 Malignant neoplasm of unspecified site of right female breast: Secondary | ICD-10-CM

## 2016-05-26 DIAGNOSIS — C50919 Malignant neoplasm of unspecified site of unspecified female breast: Secondary | ICD-10-CM

## 2016-05-26 HISTORY — DX: Malignant neoplasm of unspecified site of unspecified female breast: C50.919

## 2016-05-31 ENCOUNTER — Encounter: Payer: Self-pay | Admitting: Radiation Oncology

## 2016-05-31 ENCOUNTER — Encounter (HOSPITAL_BASED_OUTPATIENT_CLINIC_OR_DEPARTMENT_OTHER): Payer: Self-pay | Admitting: *Deleted

## 2016-06-06 ENCOUNTER — Ambulatory Visit
Admission: RE | Admit: 2016-06-06 | Discharge: 2016-06-06 | Disposition: A | Discharge status: Home / Self Care | Payer: No Typology Code available for payment source | Source: Ambulatory Visit | Attending: Surgery | Admitting: Surgery

## 2016-06-06 DIAGNOSIS — C50911 Malignant neoplasm of unspecified site of right female breast: Secondary | ICD-10-CM

## 2016-06-06 DIAGNOSIS — Z170 Estrogen receptor positive status [ER+]: Principal | ICD-10-CM

## 2016-06-06 NOTE — H&P (Signed)
Candice [name]  Location: [facility] Patient #: [id number] DOB: Dec 08, 1975 Undefined / Language: Cleophus Molt / Race: Black or African American Female  History of Present Illness   The patient is a 41 year old female who presents with a complaint of breast cancer.   The PCP is none.  The patient was referred by Dr. Ammie Ferrier.  Oncology - Magrinat and Isidore Moos  Her father, mother (who had bilateral mastectomies for DCIS by Dr. Dalbert Batman) and sister were with the patient.  The patient lost her job in August 2017. She went to the [facility] for a physical exam in December 2017. There was something felt in her right breast. She got support from [facility] to get mammograms. She was found to have a spiculated mass measuring 1.8 cm on mammogram on 04/21/2016 at [facility]. She had an [place] which showed a 2.2 x 1.7 cm mass. She underwent a biopsy of the right breast at 12:30 o'clock on 04/21/2016. 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 She has "c" dense breast.  She is on BCP. She'll stop those. Her mother had a bilateral mastectomy by Dr. Dalbert Batman in Nov 2015 for DCIS. This was her first mammogram.  I discussed the options for breast cancer treatment with the patient. She is in the [facility] which includes medical oncology and radiation oncology. I discussed the surgical options of lumpectomy vs. mastectomy. If mastectomy, there is the possibility of reconstruction. I discussed the options of lymph node biopsy. The treatment plan depends on the pathologic staging of the tumor and the patient's personal wishes. The risks of surgery include, but are not limited to, bleeding, infection, the need for further surgery, and nerve injury. The patient has been given literature on the treatment of breast cancer.  Plan: 1) MRI, 2) Oncotype, 3) Genetics, 4) Lumpectomy and  sentinel lymph node biopsy, 5) Power port (if Oncotype score medium or high) for chemotx - one decision which is not complete is the chmotx neoadjuvant or not (we can wait to see what the MRI shows)  Past Medical History: 1. Chronic constipation - was seen in [place] and [place] by GI doctors without a specific diagnosis She takes super colon cleanse 7 pills BID 2. Myomectomy - Dr. Runell Gess - 2006 3. Removal of Bartholin's cyst - Dr. Runell Gess - 2007  Social History: Unmarried. Has a PhD in [employer] - she lost a job in [place] in Aug 2017. She lives in [place], but comes to [place] because her parents are here. Her father - Candice [name], mother - Candice [name] Sister - Candice [name]  Their background is [place]   Physical Exam General: WN thin AAF alert and generally healthy appearing. Skin: Inspection and palpation of the skin unremarkable.  Eyes: Conjunctivae white, pupils equal. Face, ears, nose, mouth, and throat: Face - normal. Normal ears and nose. Lips and teeth normal.  Neck: Supple. No mass. Trachea midline. No thyroid mass. Lymph Nodes: No supraclavicular or cervical adenopathy. No axillary adenopathy. Particular attention paid to the right axilla  Lungs: Normal respiratory effort. Clear to auscultation and symmetric breath sounds. Cardiovascular: Regular rate and rythm. Normal auscultation of the heart. No murmur or rub.  Breast: Right - Dense breast which limits PE. But I think that I can feel a mass at 12:30 o'clock about 4 cm above the areola.  Left - Dense breast which limits PE. No mass.  Abdomen:  Soft. No mass. Liver and spleen not palpable. No tenderness. No hernia. Normal bowel sounds. Rectal: Not done.  Musculoskeletal/extremities: Normal gait. Good strength and ROM in upper and lower extremities.  Neurologic: Grossly intact to motor and sensory function.  Psychiatric: Has normal mood and affect. Judgement and insight appear  normal.    Assessment & Plan  1.  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  Oncology - Magrinat/Squire  Plan:   1) MRI (will talk to patient after MRI)    Her MRI showed two additional lesions - she is on for MRI biopsy on Thursday, 05/12/2016.    I talked to her on the phone. If the MRI biopsies are benign, then lumpectomy. If the MRI biopsies show cancer - more likely mastectomy.   Addendum Note(David H. Lucia Gaskins MD; 05/16/2016 3:10 PM)    I spoke to her on the phone.    Her right breast biopsy x 2 on 05/12/2016 was negative.    So we will proceed with a right breast lumpectomy and sentinel lymph node biopsy. She understands the plan.   2) Oncotype,    Her oncotype score was 14 - 9%, so she is low risk.   3) Genetics,    Her genetic tests were negative.   4) Lumpectomy and sentinel lymph node biopsy,   5) Power port (if Oncotype score medium or high) for chemotx - one decision which is not complete is the chmotx neoadjuvant or not (we can wait to see what the MRI shows)  2.  CONSTIPATION, CHRONIC (K59.09)  Alphonsa Overall, MD, [facility] Pager: [phone] Office phone:  [phone]

## 2016-06-06 NOTE — Pre-Procedure Instructions (Signed)
Boost Breeze 8 oz. given with instructions for pt. to drink by 0930 DOS.  Pt. voiced understanding.

## 2016-06-07 ENCOUNTER — Ambulatory Visit (HOSPITAL_BASED_OUTPATIENT_CLINIC_OR_DEPARTMENT_OTHER)
Admission: RE | Admit: 2016-06-07 | Discharge: 2016-06-07 | Disposition: A | Discharge status: Home / Self Care | Payer: Medicaid Other | Source: Ambulatory Visit | Attending: Surgery | Admitting: Surgery

## 2016-06-07 ENCOUNTER — Ambulatory Visit (HOSPITAL_COMMUNITY)
Admission: RE | Admit: 2016-06-07 | Discharge: 2016-06-07 | Disposition: A | Discharge status: Home / Self Care | Payer: Medicaid Other | Source: Ambulatory Visit | Attending: Surgery | Admitting: Surgery

## 2016-06-07 ENCOUNTER — Ambulatory Visit
Admission: RE | Admit: 2016-06-07 | Discharge: 2016-06-07 | Disposition: A | Discharge status: Home / Self Care | Payer: No Typology Code available for payment source | Source: Ambulatory Visit | Attending: Surgery | Admitting: Surgery

## 2016-06-07 ENCOUNTER — Ambulatory Visit (HOSPITAL_BASED_OUTPATIENT_CLINIC_OR_DEPARTMENT_OTHER): Payer: Medicaid Other | Admitting: Anesthesiology

## 2016-06-07 ENCOUNTER — Encounter (HOSPITAL_BASED_OUTPATIENT_CLINIC_OR_DEPARTMENT_OTHER): Payer: Self-pay | Admitting: *Deleted

## 2016-06-07 ENCOUNTER — Encounter (HOSPITAL_BASED_OUTPATIENT_CLINIC_OR_DEPARTMENT_OTHER)
Admission: RE | Disposition: A | Discharge status: Home / Self Care | Payer: Self-pay | Source: Ambulatory Visit | Attending: Surgery

## 2016-06-07 ENCOUNTER — Encounter (HOSPITAL_COMMUNITY): Payer: Self-pay | Admitting: *Deleted

## 2016-06-07 DIAGNOSIS — Z170 Estrogen receptor positive status [ER+]: Principal | ICD-10-CM

## 2016-06-07 DIAGNOSIS — C50911 Malignant neoplasm of unspecified site of right female breast: Secondary | ICD-10-CM

## 2016-06-07 DIAGNOSIS — C773 Secondary and unspecified malignant neoplasm of axilla and upper limb lymph nodes: Secondary | ICD-10-CM | POA: Diagnosis not present

## 2016-06-07 DIAGNOSIS — Z803 Family history of malignant neoplasm of breast: Secondary | ICD-10-CM | POA: Insufficient documentation

## 2016-06-07 DIAGNOSIS — K5909 Other constipation: Secondary | ICD-10-CM | POA: Diagnosis not present

## 2016-06-07 HISTORY — PX: BREAST LUMPECTOMY WITH RADIOACTIVE SEED AND SENTINEL LYMPH NODE BIOPSY: SHX6550

## 2016-06-07 HISTORY — PX: BREAST LUMPECTOMY: SHX2

## 2016-06-07 HISTORY — DX: Malignant neoplasm of unspecified site of unspecified female breast: C50.919

## 2016-06-07 HISTORY — DX: Other constipation: K59.09

## 2016-06-07 LAB — POCT HEMOGLOBIN-HEMACUE: Hemoglobin: 14.3 g/dL (ref 12.0–15.0)

## 2016-06-07 SURGERY — BREAST LUMPECTOMY WITH RADIOACTIVE SEED AND SENTINEL LYMPH NODE BIOPSY
Anesthesia: Regional | Site: Breast | Laterality: Right

## 2016-06-07 MED ORDER — CEFAZOLIN SODIUM-DEXTROSE 2-4 GM/100ML-% IV SOLN
INTRAVENOUS | Status: AC
  Filled 2016-06-07: qty 100

## 2016-06-07 MED ORDER — 0.9 % SODIUM CHLORIDE (POUR BTL) OPTIME
TOPICAL | Status: DC | PRN
  Administered 2016-06-07: 300 mL

## 2016-06-07 MED ORDER — ACETAMINOPHEN 500 MG PO TABS
1000.0000 mg | ORAL_TABLET | ORAL | Status: AC
  Administered 2016-06-07: 1000 mg via ORAL

## 2016-06-07 MED ORDER — HYDROCODONE-ACETAMINOPHEN 5-325 MG PO TABS: 1.0000 | ORAL_TABLET | Freq: Four times a day (QID) | ORAL | 0 refills | Status: DC | PRN

## 2016-06-07 MED ORDER — METHYLENE BLUE 0.5 % INJ SOLN
INTRAVENOUS | Status: AC
  Filled 2016-06-07: qty 10

## 2016-06-07 MED ORDER — MEPERIDINE HCL 25 MG/ML IJ SOLN: 6.2500 mg | INTRAMUSCULAR | Status: DC | PRN

## 2016-06-07 MED ORDER — GABAPENTIN 300 MG PO CAPS
300.0000 mg | ORAL_CAPSULE | ORAL | Status: AC
  Administered 2016-06-07: 300 mg via ORAL

## 2016-06-07 MED ORDER — SODIUM CHLORIDE 0.9 % IJ SOLN
INTRAMUSCULAR | Status: AC
  Filled 2016-06-07: qty 10

## 2016-06-07 MED ORDER — CHLORHEXIDINE GLUCONATE CLOTH 2 % EX PADS: 6.0000 | MEDICATED_PAD | Freq: Once | CUTANEOUS | Status: DC

## 2016-06-07 MED ORDER — LIDOCAINE 2% (20 MG/ML) 5 ML SYRINGE
INTRAMUSCULAR | Status: DC | PRN
  Administered 2016-06-07: 80 mg via INTRAVENOUS

## 2016-06-07 MED ORDER — BUPIVACAINE-EPINEPHRINE (PF) 0.5% -1:200000 IJ SOLN
INTRAMUSCULAR | Status: DC | PRN
  Administered 2016-06-07: 20 mL

## 2016-06-07 MED ORDER — ONDANSETRON HCL 4 MG/2ML IJ SOLN
INTRAMUSCULAR | Status: DC | PRN
  Administered 2016-06-07: 4 mg via INTRAVENOUS

## 2016-06-07 MED ORDER — HYDROMORPHONE HCL 1 MG/ML IJ SOLN: 0.2500 mg | INTRAMUSCULAR | Status: DC | PRN

## 2016-06-07 MED ORDER — FENTANYL CITRATE (PF) 100 MCG/2ML IJ SOLN
INTRAMUSCULAR | Status: AC
  Filled 2016-06-07: qty 2

## 2016-06-07 MED ORDER — LACTATED RINGERS IV SOLN
INTRAVENOUS | Status: DC
  Administered 2016-06-07 (×3): via INTRAVENOUS

## 2016-06-07 MED ORDER — PROPOFOL 10 MG/ML IV BOLUS
INTRAVENOUS | Status: DC | PRN
  Administered 2016-06-07: 150 mg via INTRAVENOUS

## 2016-06-07 MED ORDER — MIDAZOLAM HCL 2 MG/2ML IJ SOLN
1.0000 mg | INTRAMUSCULAR | Status: DC | PRN
  Administered 2016-06-07: 2 mg via INTRAVENOUS

## 2016-06-07 MED ORDER — CEFAZOLIN SODIUM-DEXTROSE 2-4 GM/100ML-% IV SOLN
2.0000 g | INTRAVENOUS | Status: AC
  Administered 2016-06-07: 2 g via INTRAVENOUS

## 2016-06-07 MED ORDER — DEXAMETHASONE SODIUM PHOSPHATE 4 MG/ML IJ SOLN
INTRAMUSCULAR | Status: DC | PRN
  Administered 2016-06-07: 10 mg via INTRAVENOUS

## 2016-06-07 MED ORDER — METOCLOPRAMIDE HCL 5 MG/ML IJ SOLN: 10.0000 mg | Freq: Once | INTRAMUSCULAR | Status: DC | PRN

## 2016-06-07 MED ORDER — SCOPOLAMINE 1 MG/3DAYS TD PT72: 1.0000 | MEDICATED_PATCH | Freq: Once | TRANSDERMAL | Status: DC | PRN

## 2016-06-07 MED ORDER — GABAPENTIN 300 MG PO CAPS
ORAL_CAPSULE | ORAL | Status: AC
  Filled 2016-06-07: qty 1

## 2016-06-07 MED ORDER — TECHNETIUM TC 99M SULFUR COLLOID FILTERED
1.0000 | Freq: Once | INTRAVENOUS | Status: AC | PRN
  Administered 2016-06-07: 1 via INTRADERMAL

## 2016-06-07 MED ORDER — BUPIVACAINE-EPINEPHRINE (PF) 0.5% -1:200000 IJ SOLN
INTRAMUSCULAR | Status: DC | PRN
  Administered 2016-06-07: 30 mL via PERINEURAL

## 2016-06-07 MED ORDER — MIDAZOLAM HCL 2 MG/2ML IJ SOLN
INTRAMUSCULAR | Status: AC
  Filled 2016-06-07: qty 2

## 2016-06-07 MED ORDER — FENTANYL CITRATE (PF) 100 MCG/2ML IJ SOLN
50.0000 ug | INTRAMUSCULAR | Status: AC | PRN
  Administered 2016-06-07 (×2): 50 ug via INTRAVENOUS
  Administered 2016-06-07: 100 ug via INTRAVENOUS

## 2016-06-07 MED ORDER — HYDROCODONE-ACETAMINOPHEN 7.5-325 MG PO TABS: 1.0000 | ORAL_TABLET | Freq: Once | ORAL | Status: DC | PRN

## 2016-06-07 MED ORDER — ACETAMINOPHEN 500 MG PO TABS
ORAL_TABLET | ORAL | Status: AC
  Filled 2016-06-07: qty 2

## 2016-06-07 SURGICAL SUPPLY — 55 items
BENZOIN TINCTURE PRP APPL 2/3 (GAUZE/BANDAGES/DRESSINGS) IMPLANT
BINDER BREAST LRG (GAUZE/BANDAGES/DRESSINGS) IMPLANT
BINDER BREAST MEDIUM (GAUZE/BANDAGES/DRESSINGS) IMPLANT
BINDER BREAST XLRG (GAUZE/BANDAGES/DRESSINGS) IMPLANT
BINDER BREAST XXLRG (GAUZE/BANDAGES/DRESSINGS) IMPLANT
BLADE SURG 15 STRL LF DISP TIS (BLADE) ×1 IMPLANT
BLADE SURG 15 STRL SS (BLADE) ×2
CANISTER SUC SOCK COL 7IN (MISCELLANEOUS) IMPLANT
CANISTER SUCT 1200ML W/VALVE (MISCELLANEOUS) ×3 IMPLANT
CHLORAPREP W/TINT 26ML (MISCELLANEOUS) ×3 IMPLANT
CLIP TI WIDE RED SMALL 6 (CLIP) ×3 IMPLANT
CLOSURE WOUND 1/2 X4 (GAUZE/BANDAGES/DRESSINGS)
COVER BACK TABLE 60X90IN (DRAPES) ×3 IMPLANT
COVER MAYO STAND STRL (DRAPES) ×3 IMPLANT
COVER PROBE W GEL 5X96 (DRAPES) ×3 IMPLANT
DECANTER SPIKE VIAL GLASS SM (MISCELLANEOUS) IMPLANT
DERMABOND ADVANCED (GAUZE/BANDAGES/DRESSINGS) ×2
DERMABOND ADVANCED .7 DNX12 (GAUZE/BANDAGES/DRESSINGS) ×1 IMPLANT
DEVICE DUBIN W/COMP PLATE 8390 (MISCELLANEOUS) ×3 IMPLANT
DRAPE LAPAROSCOPIC ABDOMINAL (DRAPES) ×3 IMPLANT
DRAPE UTILITY XL STRL (DRAPES) ×3 IMPLANT
DRSG PAD ABDOMINAL 8X10 ST (GAUZE/BANDAGES/DRESSINGS) IMPLANT
ELECT COATED BLADE 2.86 ST (ELECTRODE) ×3 IMPLANT
ELECT REM PT RETURN 9FT ADLT (ELECTROSURGICAL) ×3
ELECTRODE REM PT RTRN 9FT ADLT (ELECTROSURGICAL) ×1 IMPLANT
GAUZE SPONGE 4X4 12PLY STRL (GAUZE/BANDAGES/DRESSINGS) ×3 IMPLANT
GLOVE BIO SURGEON STRL SZ 6.5 (GLOVE) ×2 IMPLANT
GLOVE BIO SURGEON STRL SZ7 (GLOVE) ×3 IMPLANT
GLOVE BIO SURGEONS STRL SZ 6.5 (GLOVE) ×1
GLOVE BIOGEL PI IND STRL 7.5 (GLOVE) ×2 IMPLANT
GLOVE BIOGEL PI INDICATOR 7.5 (GLOVE) ×4
GLOVE SURG SIGNA 7.5 PF LTX (GLOVE) ×6 IMPLANT
GOWN STRL REUS W/ TWL LRG LVL3 (GOWN DISPOSABLE) ×1 IMPLANT
GOWN STRL REUS W/ TWL XL LVL3 (GOWN DISPOSABLE) ×2 IMPLANT
GOWN STRL REUS W/TWL LRG LVL3 (GOWN DISPOSABLE) ×2
GOWN STRL REUS W/TWL XL LVL3 (GOWN DISPOSABLE) ×4
KIT MARKER MARGIN INK (KITS) ×3 IMPLANT
NDL SAFETY ECLIPSE 18X1.5 (NEEDLE) IMPLANT
NEEDLE HYPO 18GX1.5 SHARP (NEEDLE)
NEEDLE HYPO 25X1 1.5 SAFETY (NEEDLE) ×3 IMPLANT
NS IRRIG 1000ML POUR BTL (IV SOLUTION) ×3 IMPLANT
PACK BASIN DAY SURGERY FS (CUSTOM PROCEDURE TRAY) ×3 IMPLANT
PENCIL BUTTON HOLSTER BLD 10FT (ELECTRODE) ×3 IMPLANT
SHEET MEDIUM DRAPE 40X70 STRL (DRAPES) ×3 IMPLANT
SLEEVE SCD COMPRESS KNEE MED (MISCELLANEOUS) ×3 IMPLANT
SPONGE LAP 18X18 X RAY DECT (DISPOSABLE) ×3 IMPLANT
STRIP CLOSURE SKIN 1/2X4 (GAUZE/BANDAGES/DRESSINGS) IMPLANT
SUT MNCRL AB 4-0 PS2 18 (SUTURE) ×9 IMPLANT
SUT VICRYL 3-0 CR8 SH (SUTURE) ×6 IMPLANT
SYR CONTROL 10ML LL (SYRINGE) ×3 IMPLANT
TOWEL OR 17X24 6PK STRL BLUE (TOWEL DISPOSABLE) ×3 IMPLANT
TOWEL OR NON WOVEN STRL DISP B (DISPOSABLE) IMPLANT
TUBE CONNECTING 20'X1/4 (TUBING) ×1
TUBE CONNECTING 20X1/4 (TUBING) ×2 IMPLANT
YANKAUER SUCT BULB TIP NO VENT (SUCTIONS) ×3 IMPLANT

## 2016-06-07 NOTE — Interval H&P Note (Signed)
History and Physical Interval Note:  06/07/2016 1:41 PM  Candice [name]  has presented today for surgery, with the diagnosis of RIGHT BREAST CANCER  The various methods of treatment have been discussed with the patient and family.  Both parents at bedside.  Seed identified.  After consideration of risks, benefits and other options for treatment, the patient has consented to  Procedure(s): BREAST LUMPECTOMY WITH RADIOACTIVE SEED AND SENTINEL LYMPH NODE BIOPSY (Right) as a surgical intervention .  The patient's history has been reviewed, patient examined, no change in status, stable for surgery.  I have reviewed the patient's chart and labs.  Questions were answered to the patient's satisfaction.     NEWMAN,DAVID H

## 2016-06-07 NOTE — Discharge Instructions (Signed)
°  Post Anesthesia Home Care Instructions  Activity: Get plenty of rest for the remainder of the day. A responsible adult should stay with you for 24 hours following the procedure.  For the next 24 hours, DO NOT: -Drive a car -Paediatric nurse -Drink alcoholic beverages -Take any medication unless instructed by your physician -Make any legal decisions or sign important papers.  Meals: Start with liquid foods such as gelatin or soup. Progress to regular foods as tolerated. Avoid greasy, spicy, heavy foods. If nausea and/or vomiting occur, drink only clear liquids until the nausea and/or vomiting subsides. Call your physician if vomiting continues.  Special Instructions/Symptoms: Your throat may feel dry or sore from the anesthesia or the breathing tube placed in your throat during surgery. If this causes discomfort, gargle with warm salt water. The discomfort should disappear within 24 hours.  If you had a scopolamine patch placed behind your ear for the management of post- operative nausea and/or vomiting:  1. The medication in the patch is effective for 72 hours, after which it should be removed.  Wrap patch in a tissue and discard in the trash. Wash hands thoroughly with soap and water. 2. You may remove the patch earlier than 72 hours if you experience unpleasant side effects which may include dry mouth, dizziness or visual disturbances. 3. Avoid touching the patch. Wash your hands with soap and water after contact with the patch.       [facility] - DISCHARGE INSTRUCTIONS TO PATIENT  Activity:  Driving - May drive in 2 or 3 days   Lifting - No lifting more than 15 pounds for 7 days, then no limits  Wound Care:   Leave the incision dry for 2 days, then may remove the bandage and shower.  Diet:  As tolerated  Follow up appointment:  Call Dr. Pollie Friar office [facility]) at [phone] for an appointment in 2 weeks.  Medications and dosages:  Resume  your home medications.  You have a prescription for:  Vicodin.  Call Dr. Lucia Gaskins or his office  [phone]) if you have:  Temperature greater than 100.4,  Persistent nausea and vomiting,  Severe uncontrolled pain,  Redness, tenderness, or signs of infection (pain, swelling, redness, odor or green/yellow discharge around the site),  Any other questions or concerns you may have after discharge.  In an emergency, call 911 or go to an Emergency Department at a nearby hospital.

## 2016-06-07 NOTE — Transfer of Care (Signed)
Immediate Anesthesia Transfer of Care Note  Patient: Candice [name]  Procedure(s) Performed: Procedure(s): BREAST LUMPECTOMY WITH RADIOACTIVE SEED AND SENTINEL LYMPH NODE BIOPSY (Right)  Patient Location: PACU  Anesthesia Type:GA combined with regional for post-op pain  Level of Consciousness: sedated  Airway & Oxygen Therapy: Patient Spontanous Breathing and Patient connected to face mask oxygen  Post-op Assessment: Report given to RN and Post -op Vital signs reviewed and stable  Post vital signs: Reviewed and stable  Last Vitals:  Vitals:   06/07/16 1550 06/07/16 1552  BP: 116/79   Pulse: 87 89  Resp:  16  Temp:  (P) 36.7 C    Last Pain:  Vitals:   06/07/16 1245  TempSrc:   PainSc: 0-No pain      Patients Stated Pain Goal: 2 (11/94/17 4081)  Complications: No apparent anesthesia complications

## 2016-06-07 NOTE — Anesthesia Procedure Notes (Signed)
Procedure Name: LMA Insertion Date/Time: 06/07/2016 2:10 PM Performed by: Lyndee Leo Pre-anesthesia Checklist: Patient identified, Emergency Drugs available, Suction available and Patient being monitored Patient Re-evaluated:Patient Re-evaluated prior to inductionOxygen Delivery Method: Circle system utilized Preoxygenation: Pre-oxygenation with 100% oxygen Intubation Type: IV induction Ventilation: Mask ventilation without difficulty LMA: LMA inserted LMA Size: 4.0 Number of attempts: 1 Airway Equipment and Method: Bite block Placement Confirmation: positive ETCO2 Tube secured with: Tape Dental Injury: Teeth and Oropharynx as per pre-operative assessment

## 2016-06-07 NOTE — Progress Notes (Signed)
Nuclear med injection completed by Eugene Garnet [phone] with timeout completed, Maceo Pro RN present. Pt resting on stretcher with bed rails upand pt  in semi fowler position.

## 2016-06-07 NOTE — Progress Notes (Signed)
Patient's medicaid is approved from 03/28/2016 to 03/27/2017. Medicaid ID# is [id number] Q.

## 2016-06-07 NOTE — Anesthesia Procedure Notes (Addendum)
Anesthesia Regional Block: Pectoralis block   Pre-Anesthetic Checklist: ,, timeout performed, Correct Patient, Correct Site, Correct Laterality, Correct Procedure, Correct Position, site marked, Risks and benefits discussed,  Surgical consent,  Pre-op evaluation,  At surgeon's request and post-op pain management  Laterality: Right  Prep: chloraprep       Needles:  Injection technique: Single-shot  Needle Type: Echogenic Stimulator Needle     Needle Length: 9cm  Needle Gauge: 21   Needle insertion depth: 6 cm   Additional Needles:   Procedures: ultrasound guided,,,,,,,,  Narrative:  Start time: 06/07/2016 12:41 PM End time: 06/07/2016 12:47 PM Injection made incrementally with aspirations every 5 mL.  Performed by: Personally  Anesthesiologist: Josephine Igo  Additional Notes: Timeout performed. Patient sedated. Relevant anatomy ID'd using [place]. Incremental 2ml injection with frequent aspiration. Patient tolerated procedure well.

## 2016-06-07 NOTE — Op Note (Signed)
06/07/2016  3:46 PM  PATIENT:  Candice [name] DOB: May 12, 1975 MRN: [id number]  PREOP DIAGNOSIS:   RIGHT BREAST CANCER  POSTOP DIAGNOSIS:    Right breast cancer, 12.30 o'clock position (T2, N0)  PROCEDURE:   Procedure(s): Right BREAST LUMPECTOMY WITH RADIOACTIVE SEED AND right axillary SENTINEL LYMPH NODE BIOPSY,  deep sentinel lymph node biopsy  SURGEON:   Alphonsa Overall, M.D.  ANESTHESIA:   general  Anesthesiologist: Catalina Gravel, MD CRNA: Lyndee Leo, CRNA; Maryella Shivers, CRNA  General  EBL:  75  ml  DRAINS:  none   LOCAL MEDICATIONS USED:   20 cc 1/2% marcaine, right pectoral block by anesthesia  SPECIMEN:   Right breast lumpectomy (painted x 6 colors), right axillary sentinel node (counts - 2400, background - 10)  COUNTS CORRECT:  YES  INDICATIONS FOR PROCEDURE:  Candice [name] is a 41 y.o. (DOB: 1975/09/14) AA female whose primary care physician is Lilian Coma, MD and comes for right breast lumpectomy and right axillary sentinel lymph node biopsy.   She was seen at the Breast multidisciplinary clinic with Drs. Magrinat and Isidore Moos.  She had an Oncotype which was low risk.  Her genetic tests were negative.  She now comes for right breast lumpectomy with right axillary sentinel lymph node biopsy.   The options for breast cancer treatment have been discussed with the patient. She elected to proceed with lumpectomy and axillary sentinel lymph node.     The indications and potential complications of surgery were explained to the patient. Potential complications include, but are not limited to, bleeding, infection, the need for further surgery, and nerve injury.     She had a I131 seed placed on 06/06/2016 in her right breast at [facility].  I confirmed the presence of the I131 seed in the pre op area using the Neoprobe.  The seed is in the 12 o'clock position of the right breast.   In the holding area, her right areola was injected with 1 millicurie of  Technitium Sulfur Colloid.  OPERATIVE NOTE:   The patient was taken to room # 6 at [facility] where she underwent a general anesthesia  supervised by Anesthesiologist: Catalina Gravel, MD CRNA: Lyndee Leo, CRNA; Maryella Shivers, CRNA. Her right breast and axilla were prepped with  ChloraPrep and sterilely draped.    A time-out and the surgical check list was reviewed.    I turned attention to the cancer which was about at the 12:30 o'clock position of the right breast.   I used the Neoprobe to identify the I131 seed.  I went through a circumareolar incision, the tumor centered about 3-4 cm above the areola.  I tried to excise an area around the tumor of at least 1 cm.    I excised this block of breast tissue approximately 4 cm by 5 cm  in diameter.  I did carry my dissection down to the chest wall.  I painted the lumpectomy specimen with the 6 color paint kit and did a specimen mammogram which confirmed the mass, clip, and the seed were all in the right position in the specimen.  The specimen was sent to pathology who called back to confirm that they have the seed and the specimen.   I then started the right deep axillary sentinel lymph node biopsy. I made an incision in the right axilla.  I found a hot area at the junction of the breast and the pectoralis major  muscle, deep in the axilla. I cut down and  identified a hot node that had counts of 2,400 and the background has 10 counts.  There were three nodes, the largest was the "hot" node, the medium sized node was "warm", and the smallest node was "cold".  I checked her internal mammary nodes and supraclavicular nodes with the neoprobe and found no other hot area. The axillary node was then sent to pathology.    I then irrigated the wound with saline. I infiltrated approximately 20 mL of 1/4% Marcaine between the incisions.  I placed 4 clips to mark biopsy cavity, at 12, 3, 6, and 9 o'clock.   I then closed all the wounds in layers using  3-0 Vicryl sutures for the deep layer. At the skin, I closed the incisions with a 4-0 Monocryl suture. The incisions were then painted with Dermabond.  She had gauze place over the wounds and placed in a breast binder.   The patient tolerated the procedure well, was transported to the recovery room in good condition. Sponge and needle count were correct at the end of the case.   Final pathology is pending.   Alphonsa Overall, MD, [facility] Pager: [phone] Office phone:  [phone]

## 2016-06-07 NOTE — Progress Notes (Signed)
Assisted Dr. Foster with right, ultrasound guided, pectoralis block. Side rails up, monitors on throughout procedure. See vital signs in flow sheet. Tolerated Procedure well. 

## 2016-06-07 NOTE — Anesthesia Preprocedure Evaluation (Signed)
Anesthesia Evaluation  Patient identified by MRN, date of birth, ID band Patient awake    Airway Mallampati: II  TM Distance: >3 FB Neck ROM: Full    Dental  (+) Caps   Pulmonary neg pulmonary ROS,    Pulmonary exam normal breath sounds clear to auscultation       Cardiovascular negative cardio ROS Normal cardiovascular exam Rhythm:Regular Rate:Normal     Neuro/Psych negative neurological ROS  negative psych ROS   GI/Hepatic Neg liver ROS, Constipation   Endo/Other  Right breast Ca  Renal/GU negative Renal ROS  negative genitourinary   Musculoskeletal negative musculoskeletal ROS (+)   Abdominal   Peds  Hematology negative hematology ROS (+)   Anesthesia Other Findings   Reproductive/Obstetrics negative OB ROS                             Anesthesia Physical Anesthesia Plan  ASA: I  Anesthesia Plan: General and Regional   Post-op Pain Management:  Regional for Post-op pain   Induction:   Airway Management Planned: LMA  Additional Equipment:   Intra-op Plan:   Post-operative Plan: Extubation in OR  Informed Consent: I have reviewed the patients History and Physical, chart, labs and discussed the procedure including the risks, benefits and alternatives for the proposed anesthesia with the patient or authorized representative who has indicated his/her understanding and acceptance.   Dental advisory given  Plan Discussed with: Anesthesiologist, CRNA and Surgeon  Anesthesia Plan Comments:         Anesthesia Quick Evaluation

## 2016-06-08 ENCOUNTER — Encounter (HOSPITAL_BASED_OUTPATIENT_CLINIC_OR_DEPARTMENT_OTHER): Payer: Self-pay | Admitting: Surgery

## 2016-06-08 NOTE — Anesthesia Postprocedure Evaluation (Signed)
Anesthesia Post Note  Patient: Candice [name]  Procedure(s) Performed: Procedure(s) (LRB): BREAST LUMPECTOMY WITH RADIOACTIVE SEED AND SENTINEL LYMPH NODE BIOPSY (Right)  Patient location during evaluation: PACU Anesthesia Type: Regional Level of consciousness: awake and alert Pain management: pain level controlled Vital Signs Assessment: post-procedure vital signs reviewed and stable Respiratory status: spontaneous breathing, nonlabored ventilation, respiratory function stable and patient connected to nasal cannula oxygen Cardiovascular status: blood pressure returned to baseline and stable Postop Assessment: no signs of nausea or vomiting Anesthetic complications: no       Last Vitals:  Vitals:   06/07/16 1630 06/07/16 1712  BP: 126/83 122/72  Pulse: 66 (!) 59  Resp: 14 16  Temp:  36.2 C    Last Pain:  Vitals:   06/08/16 0920  TempSrc:   PainSc: 2                  Catalina Gravel

## 2016-06-13 ENCOUNTER — Other Ambulatory Visit: Payer: Self-pay | Admitting: Oncology

## 2016-06-16 NOTE — Progress Notes (Signed)
Location of Breast Cancer: Right Breast  Histology per Pathology Report:  04/21/16 Diagnosis Breast, right, needle core biopsy, 12:30 o'clock - INVASIVE DUCTAL CARCINOMA. - DUCTAL CARCINOMA IN SITU. - ASSOCIATED CALCIFICATIONS.  Receptor Status: ER(95%), PR (100%), Her2-neu (NEG), Ki-(20%)  05/12/16 Diagnosis 1. Breast, right, needle core biopsy, LOQ, inferior lesion - ADENOSIS WITH CALCIFICATIONS. 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 - NO MALIGNANCY IDENTIFIED. 2. Breast, right, needle core biopsy, LOQ superior lesion - FOCAL DENSE FIBROSIS. - NO MALIGNANCY IDENTIFIED.  06/07/16 Diagnosis 1. Breast, lumpectomy, Right w/seed INVASIVE DUCTAL CARCINOMA, GRADE 2, SPANNING 1.8 CM DUCTAL CARCINOMA IN SITU LYMPHOVASCULAR INVASION IS IDENTIFIED ALL MARGINS OF RESECTION ARE NEGATIVE FOR CARCINOMA THE INVASIVE CARCINOMA IS 2 MM FROM THE POSTERIOR RESECTION MARGIN DUTAL CARCINOMA IN SITU IS FOCALLY LESS THAN 2 MM FROM THE ANTERIOR RESECTION MARGIN 2. Lymph node, sentinel, biopsy, Right Axillary #1 ONE BENIGN LYMPH NODE (0/1) 3. Lymph node, sentinel, biopsy, Right METASTATIC DUCTAL CARCINOMA IN ONE LYMPH NODE (1/1) 4. 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   Past/Anticipated interventions by surgeon, if any: 06/07/16 PROCEDURE:   Procedure(s): Right BREAST LUMPECTOMY WITH RADIOACTIVE SEED AND right axillary SENTINEL LYMPH NODE BIOPSY,  deep sentinel lymph node biopsy SURGEON:   Alphonsa Overall, M.D.  Past/Anticipated interventions by medical oncology, if any:  05/13/16 Dr. Jana Hakim (1) started tamoxifen 04/27/2016   (2) Oncotype DX obtained from the original biopsy showed a score of 14, predicting a 10 year risk of recurrence  outside the breast of 9% if the patient's only systemic therapy is tamoxifen for 5 years. It also predicts no benefit from chemotherapy  (3) patient is not interested in fertility preservation  (4) genetics testing 04/27/2016 through the [facility] gene panel offered by [facility] found no deleterious mutations in APC, ATM, BARD1, BMPR1A, BRCA1, BRCA2, BRIP1, CHD1, CDK4, CDKN2A, CHEK2, EPCAM (large rearrangement only),MLH1, MSH2, MSH6, MUTYH, NBN, PALB2, PMS2, PTEN, RAD51C, RAD51D, SMAD4, STK11, and TP53.Sequencing was performed for select regions of POLE and POLD1, and large rearrangement analysis was performed for select regions of GREM1.   (5) definitive surgery to follow  (6) adjuvant radiation as appropriate  She tells me that she has an appointment on 06/27/16 to rediscuss the pathology results. Dr. Lucia Gaskins recommended they discuss chemotherapy again based on a positive lymph node.    Lymphedema issues, if any:  She denies. She is able to lift her arm above her head.   Pain issues, if any:  She has pain to her incision site. She rates it a 4-5/10. She has not taken aleve or other pain medicine for over a week.   SAFETY ISSUES:  Prior radiation? No  Pacemaker/ICD? No  Possible current pregnancy? She denies. She has recently had her period though it is irregular since starting Tamoxifen. She denies sexual activity at this time.   Is the patient on methotrexate? No  Current Complaints / other details:  BP 103/72   Pulse 78   Temp 98.2 F (36.8 C)   Ht '5\' 4"'  (1.626 m)   Wt 138 lb 6.4 oz (62.8 kg)   LMP 05/26/2016 (Exact Date)   SpO2 100% Comment: room air  BMI 23.76 kg/m     Wt Readings from Last 3 Encounters:  06/22/16 138 lb  6.4 oz (62.8 kg)  06/07/16 134 lb 4 oz (60.9 kg)  05/13/16 140 lb (63.5 kg)      Malmfelt, Stephani Police, RN 06/16/2016,10:29 AM

## 2016-06-17 ENCOUNTER — Telehealth: Payer: Self-pay | Admitting: *Deleted

## 2016-06-17 NOTE — Telephone Encounter (Signed)
This RN returned call to pt per her VM wanting to follow up per surgery outcome with " cancer spread to my lymph node and does this change my plan ?"  Per MD review - this RN informed pt her plan of care has not changed.  Reviewed with her oncotype results as well as schedule for Rad Onc consult on 3/28 and then Dr Jannifer Rodney on 4/2.  Candice [name] verbalized understanding as well as to call if needed.

## 2016-06-22 ENCOUNTER — Encounter: Payer: Self-pay | Admitting: Radiation Oncology

## 2016-06-22 ENCOUNTER — Ambulatory Visit
Admission: RE | Admit: 2016-06-22 | Discharge: 2016-06-22 | Disposition: A | Discharge status: Home / Self Care | Payer: Medicaid Other | Source: Ambulatory Visit | Attending: Radiation Oncology | Admitting: Radiation Oncology

## 2016-06-22 ENCOUNTER — Telehealth: Payer: Self-pay | Admitting: *Deleted

## 2016-06-22 ENCOUNTER — Encounter: Payer: Self-pay | Admitting: *Deleted

## 2016-06-22 VITALS — BP 103/72 | HR 78 | Temp 98.2°F | Ht 64.0 in | Wt 138.4 lb

## 2016-06-22 DIAGNOSIS — C50211 Malignant neoplasm of upper-inner quadrant of right female breast: Secondary | ICD-10-CM | POA: Insufficient documentation

## 2016-06-22 DIAGNOSIS — Z880 Allergy status to penicillin: Secondary | ICD-10-CM | POA: Diagnosis not present

## 2016-06-22 DIAGNOSIS — Z170 Estrogen receptor positive status [ER+]: Secondary | ICD-10-CM | POA: Insufficient documentation

## 2016-06-22 DIAGNOSIS — Z79899 Other long term (current) drug therapy: Secondary | ICD-10-CM | POA: Insufficient documentation

## 2016-06-22 NOTE — Telephone Encounter (Signed)
Ordered mammaprint per Dr;.Magrinat. Faxed requisition to pathology.  

## 2016-06-22 NOTE — Progress Notes (Signed)
Radiation Oncology         [phone]) [phone] ________________________________  Name: Candice [name] MRN: [id number]  Date: 06/22/2016  DOB: 28-Aug-1975  Follow-Up Visit Note  Outpatient  CC: Lilian Coma, MD  Magrinat, Virgie Dad, MD  Diagnosis:      ICD-9-CM ICD-10-CM   1. 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 Pathologic Stage T1cN1a.  CHIEF COMPLAINT: Here to discuss management of Right breast cancer  Narrative:  The patient returns today for follow-up. She was initially seen in consultation in the multidisciplinary breast clinic. At that time, it was learned that the patient initially presented with screening detected right breast mass and suspicious calcifications in the upper outer quadrant.  Ultrasound of the right breast showed a 2.2 x 1.5 x 1.7 cm mass at the 12:30 position.  Axilla was negative on ultrasound.  Biopsy showed invasive ductal carcinoma with characteristics as described above in the diagnosis.   Since consultation, she received pre-operative antiestrogen therapy. MRI of her breasts showed clinical T2N0 disease in the right breast. Two other lesions on the MRI were biopsied but found to be benign. On 06/07/16 she underwent lumpectomy and sentinel lymph node biopsy. This revealed Grade 2 invasive ductal carcinoma with LVSI. The tumor was 1.8 cm. DCIS was less than 0.2 cm focally at the margin bordering skin. Re-excision is not recommended by Dr. Lucia Gaskins. 2 of 3 sentinel lymph nodes were positive. The cancer was ER/PR+ and Her2-. Oncotype score was low risk, but Mammaprint is now being ordered due to positive nodes. It is not yet clear if she'll receive chemotherapy.  On review of systems, the patient denies lymphedema concerns  at this time. She reports pain to her incision site, which she rates as 4-5/10. She has not taken any OTC pain medication recently. She reports her period has become irregular since starting Tamoxifen. She denies possible pregnancy and was sexually active 3 mo ago, on birth control then.  The patient has an appointment with Dr. Jana Hakim on 06/27/16.            ALLERGIES:  is allergic to amoxicillin.  Meds: Current Outpatient Prescriptions  Medication Sig Dispense Refill  . Coenzyme Q10 (COQ10 PO) Take by mouth. LIQUID    . naproxen sodium (ANAPROX) 220 MG tablet Take 220 mg by mouth 2 (two) times daily with a meal.    . OVER THE COUNTER MEDICATION Take 7 tablets by mouth 2 (two) times daily. SUPER COLON CLEANSE    . tamoxifen (NOLVADEX) 20 MG tablet Take 1 tablet (20 mg total) by mouth daily. 30 tablet 4  . HYDROcodone-acetaminophen (NORCO/VICODIN) 5-325 MG tablet Take 1-2 tablets by mouth every 6 (six) hours as needed. (Patient not taking: Reported on 06/22/2016) 30 tablet 0   No current facility-administered medications for this encounter.     Physical Findings:  height is '5\' 4"'$  (1.626 m) and weight is 138 lb 6.4 oz (62.8 kg). Her temperature is 98.2 F (36.8 C). Her blood pressure is 103/72 and her pulse is 78. Her oxygen saturation is 100%.  General: Alert and oriented, in no acute distress. HEENT: Oropharynx and oral cavity are clear. Neck: Neck is supple, no palpable cervical or supraclavicular lymphadenopathy. A palpable lymph node in the level 3/4 region of the  left neck which is mobile and flat, approximately 8 mm in greatest dimension. This feels benign. Heart: Regular in rate and rhythm with no murmurs. Chest: Clear to auscultation bilaterally. Extremities: No edema in arms or ankles. Lymphatics: see Neck Exam Musculoskeletal: Good range of motion in right arm. Breast exam reveals a central lumpectomy scar in the right breast which is healing well, as well as right axillary scar  which is healing well.  Lab Findings: Lab Results  Component Value Date   WBC 4.1 04/27/2016   HGB 14.3 06/07/2016   HCT 40.9 04/27/2016   MCV 92.7 04/27/2016   PLT 218 04/27/2016     Radiographic Findings: Mm Breast Surgical Specimen  Result Date: 06/07/2016 CLINICAL DATA:  Evaluate specimen EXAM: SPECIMEN RADIOGRAPH OF THE RIGHT BREAST COMPARISON:  Previous exam(s). FINDINGS: Status post excision of the right breast. The radioactive seed and biopsy marker clip are present, completely intact, and were marked for pathology. IMPRESSION: Specimen radiograph of the right breast. Electronically Signed   By: Dorise Bullion III M.D   On: 06/07/2016 14:49   Mm Rt Radioactive Seed Loc Mammo Guide  Result Date: 06/06/2016 CLINICAL DATA:  Preoperative radioactive seed localization for right breast upper inner quadrant mass. EXAM: MAMMOGRAPHIC GUIDED RADIOACTIVE SEED LOCALIZATION OF THE RIGHT BREAST COMPARISON:  Previous exam(s). FINDINGS: Patient presents for radioactive seed localization prior to right breast lumpectomy. I met with the patient and we discussed the procedure of seed localization including benefits and alternatives. We discussed the high likelihood of a successful procedure. We discussed the risks of the procedure including infection, bleeding, tissue injury and further surgery. We discussed the low dose of radioactivity involved in the procedure. Informed, written consent was given. The usual time-out protocol was performed immediately prior to the procedure. Using mammographic guidance, sterile technique, 1% lidocaine and an I-125 radioactive seed, right breast upper inner quadrant mass was localized using a superior approach. The follow-up mammogram images confirm the seed in the expected location and were marked for Dr. Lucia Gaskins. Follow-up survey of the patient confirms presence of the radioactive seed. Order number of I-125 seed:  [id number]. Total activity:  7.253 millicurie  Reference  Date: May 26, 2016 The patient tolerated the procedure well and was released from the [place]. She was given instructions regarding seed removal. IMPRESSION: Radioactive seed localization right breast. No apparent complications. Electronically Signed   By: Fidela Salisbury M.D.   On: 06/06/2016 13:13    Impression/Plan: We discussed adjuvant radiotherapy today.  I recommend 6 weeks of radiation therapy in order to reduce the risk of locoregional recurrence by 2/3.  The risks, benefits and side effects of this treatment were discussed in detail.  She understands that radiotherapy is associated with skin irritation and fatigue in the acute setting. Late effects can include cosmetic changes and rare injury to internal organs. She is enthusiastic about proceeding with treatment. A consent form has been signed and placed in her chart.  Radiotherapy will be held until clearance from medical oncology / verification of chemotherapy disposition.  A total of 5 medically necessary complex treatment devices will be fabricated and supervised by me: 4 fields with MLCs for custom blocks to protect heart, and lungs;  and, a Vac-lok. MORE COMPLEX DEVICES MAY BE MADE IN DOSIMETRY FOR FIELD IN FIELD BEAMS FOR DOSE HOMOGENEITY.  I have requested : 3D Simulation which is medically necessary to give adequate dose to at risk tissues while sparing lungs and heart.  I have requested  a DVH of the following structures: lungs, heart, right lumpectomy cavity.    The patient will receive 50 Gy in 25 fractions to the right breast and regional nodes with 4 fields.  This will be followed by a boost.  The left lower neck node feels benign, and I do not recommend further workup It is small but palpable; she is thin.  Today the patient will proceed with a urine sample pregnancy test to rule out pregnancy prior to radiation therapy.  I spent 30 minutes minutes face to face with the patient and more than 50% of that time was  spent in counseling and/or coordination of care. _____________________________________   Eppie Gibson, MD  This document serves as a record of services personally performed by Eppie Gibson, MD. It was created on her behalf by Maryla Morrow, a trained medical scribe. The creation of this record is based on the scribe's personal observations and the provider's statements to them. This document has been checked and approved by the attending provider.

## 2016-06-23 ENCOUNTER — Other Ambulatory Visit: Payer: Self-pay | Admitting: Oncology

## 2016-06-23 LAB — PREGNANCY, URINE: Pregnancy Test, Urine: NEGATIVE

## 2016-06-23 NOTE — Progress Notes (Unsigned)
I called Candice [name] and let her know about the discussion at conference yesterday. Basically we all felt a little bit uncomfortable relying on Oncotype to proceed without chemotherapy in this very young patient with a positive lymph node so we are sending a confirmatory Mammaprint. I will call the patient as soon as we get that result. If it comes back high risk she will return to meet with me to discuss the possibility of chemotherapy. Otherwise she will proceed directly to radiation.

## 2016-06-27 ENCOUNTER — Ambulatory Visit: Payer: Self-pay | Admitting: Oncology

## 2016-06-27 ENCOUNTER — Other Ambulatory Visit: Payer: Self-pay | Admitting: *Deleted

## 2016-06-27 DIAGNOSIS — Z170 Estrogen receptor positive status [ER+]: Secondary | ICD-10-CM

## 2016-06-27 DIAGNOSIS — C50211 Malignant neoplasm of upper-inner quadrant of right female breast: Secondary | ICD-10-CM

## 2016-06-27 DIAGNOSIS — I890 Lymphedema, not elsewhere classified: Secondary | ICD-10-CM

## 2016-06-29 ENCOUNTER — Telehealth: Payer: Self-pay | Admitting: *Deleted

## 2016-06-29 ENCOUNTER — Ambulatory Visit: Payer: Medicaid Other | Attending: Surgery | Admitting: Physical Therapy

## 2016-06-29 DIAGNOSIS — Z483 Aftercare following surgery for neoplasm: Secondary | ICD-10-CM | POA: Diagnosis not present

## 2016-06-29 DIAGNOSIS — M25511 Pain in right shoulder: Secondary | ICD-10-CM | POA: Diagnosis present

## 2016-06-29 DIAGNOSIS — M25611 Stiffness of right shoulder, not elsewhere classified: Secondary | ICD-10-CM | POA: Diagnosis present

## 2016-06-29 NOTE — Therapy (Signed)
[place], [place], [address] Phone: [phone]   Fax:  [phone]  Physical Therapy Evaluation  Patient Details  Name: Candice [name] MRN: [id number] Date of Birth: 1975-10-17 Referring Provider: Dr. Jana Hakim   Encounter Date: 06/29/2016      PT End of Session - 06/29/16 1227    Visit Number 1   Number of Visits 1   PT Start Time 1100   PT Stop Time 1145   PT Time Calculation (min) 45 min   Activity Tolerance Patient tolerated treatment well   Behavior During Therapy [facility] for tasks assessed/performed      Past Medical History:  Diagnosis Date  . Breast cancer ([place]) 05/2016   right  . Chronic constipation   . Family history of breast cancer   . Family history of ovarian cancer   . Family history of pancreatic cancer   . Family history of prostate cancer   . IBS (irritable bowel syndrome)     Past Surgical History:  Procedure Laterality Date  . BARTHOLIN CYST MARSUPIALIZATION  09/04/2002  . BARTHOLIN GLAND CYST EXCISION Left 07/13/2005  . BREAST LUMPECTOMY WITH RADIOACTIVE SEED AND SENTINEL LYMPH NODE BIOPSY Right 06/07/2016   Procedure: BREAST LUMPECTOMY WITH RADIOACTIVE SEED AND SENTINEL LYMPH NODE BIOPSY;  Surgeon: Alphonsa Overall, MD;  Location: [facility];  Service: General;  Laterality: Right;  . HYSTEROSCOPY  2016  . MYOMECTOMY  07/20/2004  . OVARIAN CYST REMOVAL Right 07/20/2004    There were no vitals filed for this visit.       Subjective Assessment - 06/29/16 1114    Subjective Pt states she is doing well after  surgery. She still has some pain with raising her arm and tenderness at posterior axilla area    Patient is accompained by: Family member   Pertinent History Patient was diagnosed on 03/25/16 with right invasive ductal carcinoma breast cancer. It measures 2.2 cm and is located in the upper inner quadrant. It is ER/PR positive and HER2 negative with a  Ki67 of 20%.   she underwent a righ lumpectomy with deep sentinel node biopsy on 06/07/2016.  She plans to have radition.    Patient Stated Goals Reduce lymphedema risk and learn post op shoulder ROM HEP   Currently in Pain? No/denies  at rest            [facility] PT Assessment - 06/29/16 0001      Assessment   Medical Diagnosis Right breast cancer   Referring Provider Dr. Jana Hakim    Onset Date/Surgical Date 03/25/16   Hand Dominance Right   Prior Therapy none     Precautions   Precautions Other (comment)   Precaution Comments active cancer     Restrictions   Weight Bearing Restrictions No     Balance Screen   Has the patient fallen in the past 6 months No   Has the patient had a decrease in activity level because of a fear of falling?  No   Is the patient reluctant to leave their home because of a fear of falling?  No     Home Environment   Living Environment Private residence   Living Arrangements Alone   Available Help at Discharge Family     Prior Function   Level of Independence Independent   Vocation Unemployed  Lost her job in 8/17 but has her PhD in [place]. studies   Leisure pt is back to walking about  2 hours 3-4 times a week      Cognition   Overall Cognitive Status Within Functional Limits for tasks assessed     Observation/Other Assessments   Observations Pt has mild fullness in posterior right axilla    Skin Integrity healing incisions with skin glue on breast and in axilla  no open areas    Other Surveys  Other Surveys   Quick DASH  38.64     Sensation   Additional Comments numbness in right posterior axilla      Coordination   Gross Motor Movements are Fluid and Coordinated Yes     Posture/Postural Control   Posture/Postural Control No significant limitations     ROM / Strength   AROM / PROM / Strength AROM;Strength     AROM   Right Shoulder Extension --   Right Shoulder Flexion 140 Degrees   Right Shoulder ABduction 130 Degrees   Right  Shoulder Internal Rotation 70 Degrees   Right Shoulder External Rotation 90 Degrees   Left Shoulder Extension --   Left Shoulder Flexion --   Left Shoulder ABduction --   Left Shoulder Internal Rotation --   Left Shoulder External Rotation --   Cervical Flexion WNL   Cervical Extension WNL   Cervical - Right Side Bend WNL   Cervical - Left Side Bend WNL   Cervical - Right Rotation WNL   Cervical - Left Rotation WNL     Strength   Overall Strength Deficits   Overall Strength Comments limited by pain in right shoulder flexion and abduction   Strength Assessment Site Shoulder   Right/Left Shoulder Right   Right Shoulder Flexion 3-/5   Right Shoulder ABduction 3-/5   Right Shoulder External Rotation 3-/5           LYMPHEDEMA/ONCOLOGY QUESTIONNAIRE - 06/29/16 1128      Right Upper Extremity Lymphedema   10 cm Proximal to Olecranon Process 28.6 cm   Olecranon Process 22.8 cm   10 cm Proximal to Ulnar Styloid Process 20 cm   Just Proximal to Ulnar Styloid Process 14.5 cm   Across Hand at [employer] 19 cm   At [place] of 2nd Digit 6 cm           Quick Dash - 06/29/16 0001    Open a tight or new jar Mild difficulty   Do heavy household chores (wash walls, wash floors) Mild difficulty   Carry a shopping bag or briefcase Mild difficulty   Wash your back Moderate difficulty   Use a knife to cut food No difficulty   Recreational activities in which you take some force or impact through your arm, shoulder, or hand (golf, hammering, tennis) Moderate difficulty   During the past week, to what extent has your arm, shoulder or hand problem interfered with your normal social activities with family, friends, neighbors, or groups? Modererately   During the past week, to what extent has your arm, shoulder or hand problem limited your work or other regular daily activities Modererately   Arm, shoulder, or hand pain. Moderate   Tingling (pins and needles) in your arm, shoulder, or hand  Moderate   Difficulty Sleeping Moderate difficulty   DASH Score 38.64 %             [facility] Adult PT Treatment/Exercise - 06/29/16 0001      Self-Care   Self-Care Other Self-Care Comments  issued foam patch to wear at posterior axilla    Other Self-Care  Comments  pt given phone number for [employer] in [place] to get a post lumpectomy compression bra if needed , and was instructed in dowel rod exercise for shoulder flexion and abduction and supine scapular series with yellow and red theraband with progression to sitting and standing as she recovers.  Pt also given information about [employer] program for her eventual return to the gym once treatment is over.                 PT Education - 06/29/16 1226    Education provided Yes   Education Details shoulder range of motion and strenthening for pt to progress to as she heals and has less pain, temporary compression for post op swelling.    Person(s) Educated Patient   Methods Explanation;Demonstration;Handout   Comprehension Verbalized understanding;Returned demonstration              Breast Clinic Goals - 04/27/16 1535      Patient will be able to verbalize understanding of pertinent lymphedema risk reduction practices relevant to her diagnosis specifically related to skin care.   Time 1   Period Days   Status Achieved     Patient will be able to return demonstrate and/or verbalize understanding of the post-op home exercise program related to regaining shoulder range of motion.   Time 1   Period Days   Status Achieved     Patient will be able to verbalize understanding of the importance of attending the postoperative After Breast Cancer Class for further lymphedema risk reduction education and therapeutic exercise.   Time 1   Period Days   Status Achieved          Long Term Clinic Goals - 06/29/16 1233      CC Long Term Goal  #1   Title Pt will verbalize understanding of progression of exercise program for  right shoulder range of motion and strength    Time 1   Period Days   Status Achieved     CC Long Term Goal  #2   Title Pt will verbalize understanding of use of compression to help with post operative swelling    Time 1   Period Days   Status Achieved            Plan - 06/29/16 1228    Clinical Impression Statement Pt is seen 3 weeks post lumpectomy and she still has increased pain with decreased range of motion and strength of right arm, but she states it is gradually improving. She has mild post op swelling in right posterior axilla.  She was educated in exericse for range of motion and strenght and how to prgress the exercise as she continues to heal as Medicaid will not authorize continued visits unless she devleops lymphedema. Because of lack of comorbidiites this is a low complexity eval with with exercise treatment session added.    Rehab Potential Excellent   Clinical Impairments Affecting Rehab Potential one deep lymph node removed    PT Frequency One time visit   PT Treatment/Interventions ADLs/Self Care Home Management;Patient/family education;Therapeutic exercise   PT Next Visit Plan no follow up at this time    Consulted and Agree with Plan of Care Patient      Patient will benefit from skilled therapeutic intervention in order to improve the following deficits and impairments:  Impaired UE functional use, Decreased range of motion, Decreased strength, Decreased knowledge of use of DME  Visit Diagnosis: Aftercare following surgery for neoplasm -  Plan: PT plan of care cert/re-cert  Stiffness of right shoulder, not elsewhere classified - Plan: PT plan of care cert/re-cert  Acute pain of right shoulder - Plan: PT plan of care cert/re-cert     Problem List Patient Active Problem List   Diagnosis Date Noted  . Genetic testing 05/09/2016  . Malignant neoplasm of upper-inner quadrant of right breast in female, estrogen receptor positive ([place]) 04/27/2016  . Family  history of breast cancer   . Family history of ovarian cancer   . Family history of prostate cancer   . Family history of pancreatic cancer    Donato Heinz. Owens Shark PT  [name] 06/29/2016, 12:37 PM  [place], [place], [address] Phone: [phone]   Fax:  [phone]  Name: Candice [name] MRN: [id number] Date of Birth: 04-01-1975

## 2016-06-29 NOTE — Patient Instructions (Addendum)
SHOULDER: Flexion - Supine (Cane)        Cancer Rehab [phone]    Hold cane in both hands. Raise arms up overhead. Do not allow back to arch. Hold _5__ seconds. Do __5-10__ times; __1-2__ times a day.   SELF ASSISTED WITH OBJECT: Shoulder Abduction / Adduction - Supine    Hold cane with both hands. Move both arms from side to side, keep elbows straight.  Hold when stretch felt for __5__ seconds. Repeat __5-10__ times; __1-2__ times a day. Once this becomes easier progress to third picture bringing affected arm towards ear by staying out to side. Same hold for _5_seconds. Repeat  _5-10_ times, _1-2_ times/day.  Shoulder Blade Stretch    Clasp fingers behind head with elbows touching in front of face. Pull elbows back while pressing shoulder blades together. Relax and hold as tolerated, can place pillow under elbow here for comfort as needed and to allow for prolonged stretch.  Repeat __5__ times. Do __1-2__ sessions per day.     SHOULDER: External Rotation - Supine (Cane)    Hold cane with both hands. Rotate arm away from body. Keep elbow on floor and next to body. _5-10__ reps per set, hold 5 seconds, _1-2__ sets per day. Add towel to keep elbow at side.  Copyright  VHI. All rights reserved.      Flexion (Eccentric) - Active-Assist (Cane)          Cancer Rehab [phone]    Use unaffected arm to push affected arm forward. Avoid hiking shoulder (shoulder should NOT touch cheek). Keep palm relaxed. Slowly lower affected arm. Hold stretch for _5_ seconds repeating _5-10_ times, _1-2_ times a day.  Abduction (Eccentric) - Active-Assist (Cane)    Use unaffected arm to push affected arm out to side. Avoid hiking shoulder (shoulder should NOT touch cheek). Keep palm relaxed. Slowly lower affected arm. Hold stretch _5_ seconds repeating _5-10_ times, _1-2_ times a day.  Cane Exercise: Extension   Stand holding cane behind back with both hands palm-up. Lift the cane  away from body until gentle stretch felt. Do NOT lean forward.  Hold __5__ seconds. Repeat _5-10___ times. Do _1-2___ sessions per day.  CHEST: Doorway, Bilateral - Standing    Standing in doorway, place hands on wall with elbows bent at shoulder height and place one foot in front of other. Shift weight onto front foot. Hold _10-20__ seconds. Do _3-5_ times, _1-2_ times a day.                     Over Head Pull: Narrow and Wide Grip   Cancer Rehab [phone]   On back, knees bent, feet flat, band across thighs, elbows straight but relaxed. Pull hands apart (start). Keeping elbows straight, bring arms up and over head, hands toward floor. Keep pull steady on band. Hold momentarily. Return slowly, keeping pull steady, back to start. Then do same with a wider grip on the band (past shoulder width) Repeat _5-10__ times. Band color __yellow____   Side Pull: Double Arm   On back, knees bent, feet flat. Arms perpendicular to body, shoulder level, elbows straight but relaxed. Pull arms out to sides, elbows straight. Resistance band comes across collarbones, hands toward floor. Hold momentarily. Slowly return to starting position. Repeat _5-10__ times. Band color _yellow____   Sword   On back, knees bent, feet flat, left hand on left hip, right hand above left. Pull right arm DIAGONALLY (hip to shoulder) across chest.  Bring right arm along head toward floor. Hold momentarily. Slowly return to starting position. Repeat _5-10__ times. Do with left arm. Band color _yellow_____   Shoulder Rotation: Double Arm   On back, knees bent, feet flat, elbows tucked at sides, bent 90, hands palms up. Pull hands apart and down toward floor, keeping elbows near sides. Hold momentarily. Slowly return to starting position. Repeat _5-10__ times. Band color __yellow____  .        Over Head Pull: Narrow and Wide Grip   Cancer Rehab [phone]   On back, knees bent, feet flat, band across  thighs, elbows straight but relaxed. Pull hands apart (start). Keeping elbows straight, bring arms up and over head, hands toward floor. Keep pull steady on band. Hold momentarily. Return slowly, keeping pull steady, back to start. Then do same with a wider grip on the band (past shoulder width) Repeat _5-10__ times. Band color __yellow____   Side Pull: Double Arm   On back, knees bent, feet flat. Arms perpendicular to body, shoulder level, elbows straight but relaxed. Pull arms out to sides, elbows straight. Resistance band comes across collarbones, hands toward floor. Hold momentarily. Slowly return to starting position. Repeat _5-10__ times. Band color _yellow____   Sword   On back, knees bent, feet flat, left hand on left hip, right hand above left. Pull right arm DIAGONALLY (hip to shoulder) across chest. Bring right arm along head toward floor. Hold momentarily. Slowly return to starting position. Repeat _5-10__ times. Do with left arm. Band color _yellow_____   Shoulder Rotation: Double Arm   On back, knees bent, feet flat, elbows tucked at sides, bent 90, hands palms up. Pull hands apart and down toward floor, keeping elbows near sides. Hold momentarily. Slowly return to starting position. Repeat _5-10__ times. Band color __yellow____

## 2016-06-29 NOTE — Telephone Encounter (Signed)
Received Mammaprint score of HIGH RISK. Physician team notified.

## 2016-06-30 ENCOUNTER — Telehealth: Payer: Self-pay | Admitting: *Deleted

## 2016-06-30 NOTE — Telephone Encounter (Signed)
Called pt to discuss Mammaprint results of HIGH RISK. Scheduled and confirmed appt with Dr. Jana Hakim on 4/6 at 1:30pm to discuss results and next step. Denies questions or needs at this time. Contact information provided.

## 2016-07-01 ENCOUNTER — Ambulatory Visit: Payer: Medicaid Other | Admitting: Physical Therapy

## 2016-07-01 ENCOUNTER — Other Ambulatory Visit: Payer: Self-pay | Admitting: Surgery

## 2016-07-01 ENCOUNTER — Encounter (HOSPITAL_COMMUNITY): Payer: Self-pay

## 2016-07-01 ENCOUNTER — Ambulatory Visit (HOSPITAL_BASED_OUTPATIENT_CLINIC_OR_DEPARTMENT_OTHER): Payer: Medicaid Other | Admitting: Oncology

## 2016-07-01 ENCOUNTER — Telehealth: Payer: Self-pay | Admitting: Oncology

## 2016-07-01 VITALS — BP 115/67 | HR 76 | Temp 98.3°F | Resp 18 | Ht 64.0 in | Wt 135.1 lb

## 2016-07-01 DIAGNOSIS — Z170 Estrogen receptor positive status [ER+]: Secondary | ICD-10-CM

## 2016-07-01 DIAGNOSIS — C50211 Malignant neoplasm of upper-inner quadrant of right female breast: Secondary | ICD-10-CM | POA: Diagnosis not present

## 2016-07-01 MED ORDER — PROCHLORPERAZINE MALEATE 10 MG PO TABS: 10.0000 mg | ORAL_TABLET | Freq: Four times a day (QID) | ORAL | 1 refills | Status: DC | PRN

## 2016-07-01 MED ORDER — DEXAMETHASONE 4 MG PO TABS: ORAL_TABLET | ORAL | 1 refills | Status: DC

## 2016-07-01 MED ORDER — LORAZEPAM 0.5 MG PO TABS: 0.5000 mg | ORAL_TABLET | Freq: Every evening | ORAL | 0 refills | Status: DC | PRN

## 2016-07-01 MED ORDER — LIDOCAINE-PRILOCAINE 2.5-2.5 % EX CREA: TOPICAL_CREAM | CUTANEOUS | 3 refills | Status: DC

## 2016-07-01 NOTE — Telephone Encounter (Signed)
Gave patient avs report and appointments for April and May. Echo to [employer] for pre auth.

## 2016-07-01 NOTE — Progress Notes (Signed)
[facility]  Telephone:(336) [phone] Fax:(336) [phone]     ID: [name] DOB: Sep 22, 1975  MR#: [id number]  GGY#:[id number]  Patient Care Team: Jonathon Jordan, MD as PCP - General (Family Medicine) Alphonsa Overall, MD as Consulting Physician (General Surgery) Chauncey Cruel, MD as Consulting Physician (Oncology) Eppie Gibson, MD as Attending Physician (Radiation Oncology) Osborne Oman, MD as Attending Physician (Obstetrics and Gynecology) Chauncey Cruel, MD OTHER MD:  CHIEF COMPLAINT: estrogen receptor positive breast cancer  CURRENT TREATMENT: Adjuvant chemotherapy   BREAST CANCER HISTORY: From the original intake note:  Candice [name] [facility] [place] in December, at which time a mass in the right breast was noted. She was referred for BCCCP and screening mammography was obtained, showing a possible mass in the right breast.on 04/21/2016 she underwent right diagnostic mammography with tomography and ultrasonography at the breast Center, and this confirmedan irregular spiculat in the right breast measuring 1.8 cm, which was not palpable to the mammographer. Ultrasonography confirmed an irregular hypoechoic right breast mass at the 12:30 o'clock position 4 cm from the nipple measuring 2.2 cm. The right axilla was sonographically benign.  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  Her subsequent history is detailed below  INTERVAL HISTORY: Candice [name] returns today for follow-up of her estrogen receptor positive breast cancer, accompanied by her sister and mother. On 06/07/2016 she underwent right lumpectomy and sentinel lymph node sampling. This showed ([id number])  invasive ductal carcinoma, grade 2, measuring 1.8 cm. She had 3 sentinel lymph nodes removed, one of which was positive, one of which showed a micrometastatic deposit.   Her case was represented at conference and we all felt uncomfortable with lying on Oncotype in this young woman with a positive lymph node. Accordingly we requested a Mammaprint's test on the definitive surgical sample. This came back high risk. She is here today to discuss those results.  REVIEW OF SYSTEMS: [name] tolerated her surgery well and she is very pleased with the cosmetic result. She has minimal discomfort in the surgical breast. She is doing her exercises regularly and already has a good range of motion in the right upper extremity. Aside from these issues a detailed review of systems today was noncontributory  PAST MEDICAL HISTORY: Past Medical History:  Diagnosis Date  . Breast cancer ([place]) 05/2016   right  . Chronic constipation   . Family history of breast cancer   . Family history of ovarian cancer   . Family history of pancreatic cancer   . Family history of prostate cancer   . IBS (irritable bowel syndrome)     PAST SURGICAL HISTORY: Past Surgical History:  Procedure Laterality Date  . BARTHOLIN CYST MARSUPIALIZATION  09/04/2002  . BARTHOLIN GLAND CYST EXCISION Left 07/13/2005  . BREAST LUMPECTOMY WITH RADIOACTIVE SEED AND SENTINEL LYMPH NODE BIOPSY Right 06/07/2016   Procedure: BREAST LUMPECTOMY WITH RADIOACTIVE SEED AND SENTINEL LYMPH NODE BIOPSY;  Surgeon: Alphonsa Overall, MD;  Location: [facility];  Service: General;  Laterality: Right;  . HYSTEROSCOPY  2016  . MYOMECTOMY  07/20/2004  . OVARIAN CYST REMOVAL Right 07/20/2004    FAMILY HISTORY Family History  Problem Relation Age of Onset  . Breast cancer Mother 52  .  Pancreatic cancer Paternal Grandmother   . Prostate cancer Paternal Grandfather   . Ovarian cancer Other     MGMs sister  . Breast cancer Other     mother's  maternal first cousin  The patient's father, Candice [name], lives in [place] and works as a Geneticist, molecular. The patient's mother is a Quarry manager. The patient has no brother, one sister, who works in [place] as a Network engineer. The patient's mother had breast cancer, stage 0, diagnosed at age 30. There are 2 maternal relatives (mother's sister and mother's niece) with breast cancer, both postmenopausal.  GYNECOLOGIC HISTORY:  Menarche age 58, the patient is GX P0. She has regular periods, most recently mid-January. She is on oral contraceptives at present. No LMP recorded.   SOCIAL HISTORY:  The patient has a PhD degree and has worked for [employer] here and in [place] but is currently unemployed.  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 She generally lives in [place] by herself although she is currently staying at her parents.    ADVANCED DIRECTIVES: Not in place. The patient tells me she intends to name her sister as her healthcare power of attorney.   HEALTH MAINTENANCE: Social History  Substance Use Topics  . Smoking status: Never Smoker  . Smokeless tobacco: Never Used  . Alcohol use 0.6 oz/week    1 Glasses of wine per week     Comment: socially     Colonoscopy: July 2016, in [place]  PAP:  Bone density: Never   Allergies  Allergen Reactions  . Amoxicillin Rash    Current Outpatient Prescriptions  Medication Sig Dispense Refill  . Coenzyme Q10 (COQ10 PO) Take by mouth. LIQUID    . HYDROcodone-acetaminophen (NORCO/VICODIN) 5-325 MG tablet Take 1-2 tablets by mouth every 6 (six) hours as needed. (Patient not taking: Reported on 06/22/2016) 30 tablet 0  . naproxen sodium (ANAPROX) 220 MG tablet Take 220 mg by mouth 2 (two) times daily with a meal.    . OVER THE COUNTER MEDICATION Take 7 tablets by mouth 2 (two) times daily. SUPER COLON CLEANSE    . tamoxifen (NOLVADEX) 20 MG tablet Take 1 tablet (20 mg total) by mouth daily. 30 tablet 4   No  current facility-administered medications for this visit.     OBJECTIVE: Young-appearing African-American woman In no acute distress  Vitals:   07/01/16 1316  BP: 115/67  Pulse: 76  Resp: 18  Temp: 98.3 F (36.8 C)     Body mass index is 23.19 kg/m.    ECOG FS:0 - Asymptomatic  Sclerae unicteric, EOMs intact Oropharynx clear and moist No cervical or supraclavicular adenopathy Lungs no rales or rhonchi Heart regular rate and rhythm Abd soft, nontender, positive bowel sounds MSK no focal spinal tenderness, no upper extremity lymphedema Neuro: nonfocal, well oriented, appropriate affect Breasts: Right breast is status post recent lumpectomy. The incisions are healing very nicely. The cosmetic result is excellent. The right breast is less than 5% smaller than the left at present. There is no dehiscence, swelling or erythema. The left breast is unremarkable. Both axillae are benign.   LAB RESULTS:  CMP     Component Value Date/Time   NA 142 04/27/2016 0833   K 3.9 04/27/2016 0833   CO2 25 04/27/2016 0833   GLUCOSE 87 04/27/2016 0833   BUN 9.1 04/27/2016 0833   CREATININE 0.8 04/27/2016 0833   CALCIUM 9.5 04/27/2016 0833   PROT 7.2 04/27/2016 3300  ALBUMIN 3.8 04/27/2016 0833   AST 27 04/27/2016 0833   ALT 12 04/27/2016 0833   ALKPHOS 38 (L) 04/27/2016 0833   BILITOT 0.35 04/27/2016 0833    INo results found for: SPEP, UPEP  Lab Results  Component Value Date   WBC 4.1 04/27/2016   NEUTROABS 2.1 04/27/2016   HGB 14.3 06/07/2016   HCT 40.9 04/27/2016   MCV 92.7 04/27/2016   PLT 218 04/27/2016      Chemistry      Component Value Date/Time   NA 142 04/27/2016 0833   K 3.9 04/27/2016 0833   CO2 25 04/27/2016 0833   BUN 9.1 04/27/2016 0833   CREATININE 0.8 04/27/2016 0833      Component Value Date/Time   CALCIUM 9.5 04/27/2016 0833   ALKPHOS 38 (L) 04/27/2016 0833   AST 27 04/27/2016 0833   ALT 12 04/27/2016 0833   BILITOT 0.35 04/27/2016 0833        No results found for: LABCA2  No components found for: LABCA125  No results for input(s): INR in the last 168 hours.  Urinalysis No results found for: COLORURINE, APPEARANCEUR, LABSPEC, PHURINE, GLUCOSEU, HGBUR, BILIRUBINUR, KETONESUR, PROTEINUR, UROBILINOGEN, NITRITE, LEUKOCYTESUR   STUDIES: Mm Breast Surgical Specimen  Result Date: 06/07/2016 CLINICAL DATA:  Evaluate specimen EXAM: SPECIMEN RADIOGRAPH OF THE RIGHT BREAST COMPARISON:  Previous exam(s). FINDINGS: Status post excision of the right breast. The radioactive seed and biopsy marker clip are present, completely intact, and were marked for pathology. IMPRESSION: Specimen radiograph of the right breast. Electronically Signed   By: Dorise Bullion III M.D   On: 06/07/2016 14:49   Mm Rt Radioactive Seed Loc Mammo Guide  Result Date: 06/06/2016 CLINICAL DATA:  Preoperative radioactive seed localization for right breast upper inner quadrant mass. EXAM: MAMMOGRAPHIC GUIDED RADIOACTIVE SEED LOCALIZATION OF THE RIGHT BREAST COMPARISON:  Previous exam(s). FINDINGS: Patient presents for radioactive seed localization prior to right breast lumpectomy. I met with the patient and we discussed the procedure of seed localization including benefits and alternatives. We discussed the high likelihood of a successful procedure. We discussed the risks of the procedure including infection, bleeding, tissue injury and further surgery. We discussed the low dose of radioactivity involved in the procedure. Informed, written consent was given. The usual time-out protocol was performed immediately prior to the procedure. Using mammographic guidance, sterile technique, 1% lidocaine and an I-125 radioactive seed, right breast upper inner quadrant mass was localized using a superior approach. The follow-up mammogram images confirm the seed in the expected location and were marked for Dr. Lucia Gaskins. Follow-up survey of the patient confirms presence of the radioactive  seed. Order number of I-125 seed:  [id number]. Total activity:  2.683 millicurie  Reference Date: May 26, 2016 The patient tolerated the procedure well and was released from the [facility]. She was given instructions regarding seed removal. IMPRESSION: Radioactive seed localization right breast. No apparent complications. Electronically Signed   By: Fidela Salisbury M.D.   On: 06/06/2016 13:13    ELIGIBLE FOR AVAILABLE RESEARCH PROTOCOL: *no  ASSESSMENT: 41 y.o. [place] woman currently residing in [place], status post right breast upper inner quadrant biopsy 04/21/2016 for a clinical T2 N0, stage IIa invasive ductal carcinoma, grade 2, estrogen and progesterone receptor positive, HER-2 nonamplified, with an MIB-1 of 20%  (a) biopsy of 2 additional suspicious areas in the right breast 05/12/2016 was benign  (1) started tamoxifen 04/27/2016, Discontinued at the start of chemotherapy  (2) Oncotype DX obtained from the  original biopsy showed a score of 14, predicting a 10 year risk of recurrence outside the breast of 9% if the patient's only systemic therapy is tamoxifen for 5 years. It also predicts no benefit from chemotherapy.   (3) patient is not interested in fertility preservation  (4) genetics testing 04/27/2016 through the [facility] gene panel offered by [facility] found no deleterious mutations in APC, ATM, BARD1, BMPR1A, BRCA1, BRCA2, BRIP1, CHD1, CDK4, CDKN2A, CHEK2, EPCAM (large rearrangement only), MLH1, MSH2, MSH6, MUTYH, NBN, PALB2, PMS2, PTEN, RAD51C, RAD51D, SMAD4, STK11, and TP53. Sequencing was performed for select regions of POLE and POLD1, and large rearrangement analysis was performed for select regions of GREM1.   (5) status post right lumpectomy with sentinel lymph node sampling 06/07/2016 for apT1c pN1, stage IB invasive ductal carcinoma, grade 2, with negative margins   (6) Mammaprint sent from the final surgical sample was read as high  risk, indicating a need for chemotherapy   (7) doxorubicin and cyclophosphamide in dose dense fashion 4 to started 07/12/2016 followed by paclitaxel weekly 12  (8) adjuvant radiation  to follow   (9) to resume tamoxifen at the completion of local treatment  PLAN: I spent approximately 40 minutes today with the patient and her family going over her situation. We reviewed the results of the Mammaprint test in detail. She understands if she had local treatment only her risk of recurrence outside the breast (incurable stage IV disease) according to this test would be somewhere between 18 and 23%. On the other hand if she receives both chemotherapy and radiation her chance of being alive with no distant disease 5 years from now would be in the 92% range.  While we understand that Mammaprint does not predict benefit from chemotherapy, it certainly predicts a significant risk which in this case clearly mandates chemotherapy. She is very much in agreement with the plan.  Accordingly we discussed the possible toxicities side effects and complications of doxorubicin and cyclophosphamide, which she will receive in dose dense fashion beginning 07/12/2016. She will then receive paclitaxel weekly 12, after which she will proceed to radiation and then anti-estrogens  She understands she needs a port, and echo, and to come to chemotherapy school for further instruction and symptom management.  I went ahead and gave her a road map on how to take her supportive medicines and placed all the prescriptions in her pharmacy so she can start getting that accomplished. She will return to see [place] the day of treatment and we will pretty much see her every week for the next 5 months until she completes her treatments  She has a good understanding of this plan. She agrees with it. She knows to call for any problems that may develop before her next visit here.  :Chauncey Cruel, MD   07/01/2016 1:26 PM Medical Oncology  and Hematology [facility] [address] [place], [place] [address] Tel. [phone]    Fax. [phone]

## 2016-07-04 ENCOUNTER — Other Ambulatory Visit: Payer: No Typology Code available for payment source

## 2016-07-04 ENCOUNTER — Encounter (HOSPITAL_BASED_OUTPATIENT_CLINIC_OR_DEPARTMENT_OTHER): Payer: Self-pay | Admitting: *Deleted

## 2016-07-04 NOTE — H&P (Signed)
Candice [name]  Location: [facility] Patient #: [id number] DOB: Jan 13, 1976 Single / Language: Cleophus Molt / Race: Black or African American Female  History of Present Illness   The patient is a 41 year old female who presents with a complaint of breast cancer - [place] and [place] Her mother (who had bilateral mastectomies for DCIS by Dr. Dalbert Batman) is with Candice [name] today.  She underwent a right breast lumpectomy and right axillary SLNBx on 06/07/2016. Pathology [phone]) showed a 1.8 cm IDC, 2/3 nodes positive (one was micromet). I gave her a copy of the path report. Dr. Jana Hakim has spoken to the patient by phone. She has a Mammoprint ordered. She will meet with Dr. Jana Hakim after the results to decide whether to have chemotherapy or not. She saw Dr. Isidore Moos yesterday. I went over a power port discussion and potential complications. This would only be placed if she were to get chemotherapy. I discussed the indications and potential complications of the power port placement. The primary complications of the power port, include, but are not limited to, bleeding, infection, nerve injury, thrombosis, and pneumothorax.  Plan: 1) will need power port for adjuvant chemotx.  History of breast cancer: The patient lost her job in August 2017. She went to the [facility] for a physical exam in December 2017. There was something felt in her right breast. She got support from [facility] to get mammograms. She was found to have a spiculated mass measuring 1.8 cm on mammogram on 04/21/2016 at [facility]. She had an [place] which showed a 2.2 x 1.7 cm mass. She underwent a biopsy of the right breast at 12:30 o'clock on 04/21/2016. The biopsy ([id number]) showed IDC, ER - 95%, PR - 100%, Her2Neu - Negative, Ki67 - 20%. She has "c" dense breast. She is on BCP. She'll stop those. Her mother had a bilateral mastectomy by Dr. Dalbert Batman in Nov  2015 for DCIS. This was her first mammogram.  Past Medical History: 1. Chronic constipation - was seen in [place] and [place] by GI doctors without a specific diagnosis She takes super colon cleanse 7 pills BID 2. Myomectomy - Dr. Runell Gess - 2006 3. Removal of Bartholin's cyst - Dr. Runell Gess - 2007  Social History: Unmarried. Has a PhD in [employer] - she lost a job in [place] in Aug 2017. She lives in [place], but comes to [place] because her parents are here. Her father - Mikayela Deats, mother - Dena Sister - Dionicia Cerritos  Their background is [place]   Allergies Nance Pear, [place]; 06/23/2016 3:56 PM) Amoxicillin *PENICILLINS*  Rash. Allergies Reconciled   Medication History Nance Pear, CMA; 06/23/2016 3:57 PM) Coenzyme Q10 (10MG Capsule, Oral daily) Active. Tamoxifen Citrate (20MG Tablet, Oral daily) Active. Naproxen Sodium (220MG Tablet, Oral daily) Active. Medications Reconciled  Vitals Bary Castilla Bradford CMA; 06/23/2016 3:57 PM) 06/23/2016 3:57 PM Weight: 136 lb Height: 64in Body Surface Area: 1.66 m Body Mass Index: 23.34 kg/m  Temp.: 45F  Pulse: 81 (Regular)  BP: 112/62 (Sitting, Left Arm, Standard)  Physical Exam  General: WN thin AAF alert and generally healthy appearing. She has a very pleasant personality. Skin: Inspection and palpation of the skin unremarkable.  Eyes: Conjunctivae white, pupils equal. Face, ears, nose, mouth, and throat: Face - normal. Normal ears and nose. Lips and teeth normal.  Neck: Supple. No mass. Trachea midline. No thyroid mass.  Lymph Nodes: No supraclavicular or cervical adenopathy. Right axillary incision is tender.  Breast: Right - Circumareolar  incision looks good. left - Dense breast which limits PE. No mass.  Musculoskeletal/extremities: She is somewhat sore moving her right arm. I encouraged her to continue this.   Assessment & Plan  1.  MALIGNANT  NEOPLASM OF RIGHT BREAST, STAGE 1, ESTROGEN RECEPTOR POSITIVE (C50.911)  Story: She underwent a biopsy of the right breast at 12:30 o'clock on 04/21/2016. The biopsy ([id number]) showed IDC, ER - 95%, PR - 100%, Her2Neu - Negative, Ki67 - 20%.   Right breast lumpectomy and right axillary SLNBx on 06/07/2016. Pathology [phone]) showed a 1.8 cm IDC, 2/3 nodes positive (one was micromet).  Oncology - Magrinat/[place]    Plan:   1) Mammoprint ordered - decision for chemotx pending    Addendum Note(David H. Lucia Gaskins MD; 07/01/2016 11:24 AM)    Her mammoprint score is high. She meets with Dr. Jana Hakim today (4/6)    2.  CONSTIPATION, CHRONIC (K59.09)   Alphonsa Overall, MD, [facility] Pager: [phone] Office phone:  [phone]

## 2016-07-05 ENCOUNTER — Ambulatory Visit (HOSPITAL_BASED_OUTPATIENT_CLINIC_OR_DEPARTMENT_OTHER)
Admission: RE | Admit: 2016-07-05 | Discharge: 2016-07-05 | Disposition: A | Discharge status: Home / Self Care | Payer: Medicaid Other | Source: Ambulatory Visit | Attending: Surgery | Admitting: Surgery

## 2016-07-05 ENCOUNTER — Ambulatory Visit (HOSPITAL_COMMUNITY): Payer: Medicaid Other

## 2016-07-05 ENCOUNTER — Ambulatory Visit (HOSPITAL_BASED_OUTPATIENT_CLINIC_OR_DEPARTMENT_OTHER): Payer: Medicaid Other | Admitting: Certified Registered"

## 2016-07-05 ENCOUNTER — Encounter (HOSPITAL_BASED_OUTPATIENT_CLINIC_OR_DEPARTMENT_OTHER)
Admission: RE | Disposition: A | Discharge status: Home / Self Care | Payer: Self-pay | Source: Ambulatory Visit | Attending: Surgery

## 2016-07-05 ENCOUNTER — Encounter (HOSPITAL_BASED_OUTPATIENT_CLINIC_OR_DEPARTMENT_OTHER): Payer: Self-pay | Admitting: Certified Registered"

## 2016-07-05 DIAGNOSIS — Z170 Estrogen receptor positive status [ER+]: Secondary | ICD-10-CM | POA: Diagnosis not present

## 2016-07-05 DIAGNOSIS — Z79810 Long term (current) use of selective estrogen receptor modulators (SERMs): Secondary | ICD-10-CM | POA: Insufficient documentation

## 2016-07-05 DIAGNOSIS — Z791 Long term (current) use of non-steroidal anti-inflammatories (NSAID): Secondary | ICD-10-CM | POA: Insufficient documentation

## 2016-07-05 DIAGNOSIS — Z95828 Presence of other vascular implants and grafts: Secondary | ICD-10-CM

## 2016-07-05 DIAGNOSIS — C50911 Malignant neoplasm of unspecified site of right female breast: Secondary | ICD-10-CM | POA: Diagnosis not present

## 2016-07-05 DIAGNOSIS — K5909 Other constipation: Secondary | ICD-10-CM | POA: Diagnosis not present

## 2016-07-05 DIAGNOSIS — Z79899 Other long term (current) drug therapy: Secondary | ICD-10-CM | POA: Insufficient documentation

## 2016-07-05 DIAGNOSIS — Z803 Family history of malignant neoplasm of breast: Secondary | ICD-10-CM | POA: Diagnosis not present

## 2016-07-05 DIAGNOSIS — Z419 Encounter for procedure for purposes other than remedying health state, unspecified: Secondary | ICD-10-CM

## 2016-07-05 HISTORY — PX: PORTACATH PLACEMENT: SHX2246

## 2016-07-05 SURGERY — INSERTION, TUNNELED CENTRAL VENOUS DEVICE, WITH PORT
Anesthesia: General | Site: Chest

## 2016-07-05 MED ORDER — LIDOCAINE-PRILOCAINE 2.5-2.5 % EX CREA: 1.0000 "application " | TOPICAL_CREAM | CUTANEOUS | 0 refills | Status: DC | PRN

## 2016-07-05 MED ORDER — OXYCODONE HCL 5 MG PO TABS: 5.0000 mg | ORAL_TABLET | Freq: Once | ORAL | Status: DC | PRN

## 2016-07-05 MED ORDER — LACTATED RINGERS IV SOLN
INTRAVENOUS | Status: DC
  Administered 2016-07-05 (×2): via INTRAVENOUS

## 2016-07-05 MED ORDER — BUPIVACAINE HCL (PF) 0.25 % IJ SOLN
INTRAMUSCULAR | Status: AC
  Filled 2016-07-05: qty 30

## 2016-07-05 MED ORDER — CHLORHEXIDINE GLUCONATE CLOTH 2 % EX PADS: 6.0000 | MEDICATED_PAD | Freq: Once | CUTANEOUS | Status: DC

## 2016-07-05 MED ORDER — ONDANSETRON HCL 4 MG/2ML IJ SOLN
INTRAMUSCULAR | Status: AC
  Filled 2016-07-05: qty 2

## 2016-07-05 MED ORDER — PROMETHAZINE HCL 25 MG/ML IJ SOLN: 6.2500 mg | INTRAMUSCULAR | Status: DC | PRN

## 2016-07-05 MED ORDER — CEFAZOLIN SODIUM-DEXTROSE 2-4 GM/100ML-% IV SOLN
2.0000 g | INTRAVENOUS | Status: AC
  Administered 2016-07-05: 2 g via INTRAVENOUS

## 2016-07-05 MED ORDER — ACETAMINOPHEN 500 MG PO TABS
ORAL_TABLET | ORAL | Status: AC
  Filled 2016-07-05: qty 2

## 2016-07-05 MED ORDER — DEXAMETHASONE SODIUM PHOSPHATE 10 MG/ML IJ SOLN
INTRAMUSCULAR | Status: AC
  Filled 2016-07-05: qty 1

## 2016-07-05 MED ORDER — LIDOCAINE 2% (20 MG/ML) 5 ML SYRINGE
INTRAMUSCULAR | Status: AC
  Filled 2016-07-05: qty 5

## 2016-07-05 MED ORDER — GABAPENTIN 300 MG PO CAPS
300.0000 mg | ORAL_CAPSULE | ORAL | Status: AC
  Administered 2016-07-05: 300 mg via ORAL

## 2016-07-05 MED ORDER — SCOPOLAMINE 1 MG/3DAYS TD PT72: 1.0000 | MEDICATED_PATCH | Freq: Once | TRANSDERMAL | Status: DC | PRN

## 2016-07-05 MED ORDER — HEPARIN (PORCINE) IN NACL 2-0.9 UNIT/ML-% IJ SOLN
INTRAMUSCULAR | Status: DC | PRN
  Administered 2016-07-05: 1 via INTRAVENOUS

## 2016-07-05 MED ORDER — MIDAZOLAM HCL 2 MG/2ML IJ SOLN
1.0000 mg | INTRAMUSCULAR | Status: DC | PRN
  Administered 2016-07-05: 2 mg via INTRAVENOUS

## 2016-07-05 MED ORDER — HYDROMORPHONE HCL 1 MG/ML IJ SOLN: 0.2500 mg | INTRAMUSCULAR | Status: DC | PRN

## 2016-07-05 MED ORDER — FENTANYL CITRATE (PF) 100 MCG/2ML IJ SOLN
50.0000 ug | INTRAMUSCULAR | Status: DC | PRN
  Administered 2016-07-05 (×2): 50 ug via INTRAVENOUS

## 2016-07-05 MED ORDER — MIDAZOLAM HCL 2 MG/2ML IJ SOLN
INTRAMUSCULAR | Status: AC
  Filled 2016-07-05: qty 2

## 2016-07-05 MED ORDER — BUPIVACAINE-EPINEPHRINE 0.25% -1:200000 IJ SOLN
INTRAMUSCULAR | Status: DC | PRN
  Administered 2016-07-05: 12 mL

## 2016-07-05 MED ORDER — ACETAMINOPHEN 500 MG PO TABS
1000.0000 mg | ORAL_TABLET | ORAL | Status: AC
  Administered 2016-07-05: 1000 mg via ORAL

## 2016-07-05 MED ORDER — DEXAMETHASONE SODIUM PHOSPHATE 4 MG/ML IJ SOLN
INTRAMUSCULAR | Status: DC | PRN
  Administered 2016-07-05: 10 mg via INTRAVENOUS

## 2016-07-05 MED ORDER — LIDOCAINE HCL (CARDIAC) 20 MG/ML IV SOLN
INTRAVENOUS | Status: DC | PRN
  Administered 2016-07-05: 60 mg via INTRAVENOUS

## 2016-07-05 MED ORDER — CEFAZOLIN SODIUM-DEXTROSE 2-4 GM/100ML-% IV SOLN
INTRAVENOUS | Status: AC
  Filled 2016-07-05: qty 100

## 2016-07-05 MED ORDER — HEPARIN SOD (PORK) LOCK FLUSH 100 UNIT/ML IV SOLN
INTRAVENOUS | Status: AC
  Filled 2016-07-05: qty 5

## 2016-07-05 MED ORDER — MEPERIDINE HCL 25 MG/ML IJ SOLN: 6.2500 mg | INTRAMUSCULAR | Status: DC | PRN

## 2016-07-05 MED ORDER — PROPOFOL 10 MG/ML IV BOLUS
INTRAVENOUS | Status: DC | PRN
  Administered 2016-07-05: 150 mg via INTRAVENOUS

## 2016-07-05 MED ORDER — FENTANYL CITRATE (PF) 100 MCG/2ML IJ SOLN
INTRAMUSCULAR | Status: AC
Start: 2016-07-05 — End: 2016-07-05
  Filled 2016-07-05: qty 2

## 2016-07-05 MED ORDER — ONDANSETRON HCL 4 MG/2ML IJ SOLN
INTRAMUSCULAR | Status: DC | PRN
  Administered 2016-07-05: 4 mg via INTRAVENOUS

## 2016-07-05 MED ORDER — HEPARIN SOD (PORK) LOCK FLUSH 100 UNIT/ML IV SOLN
INTRAVENOUS | Status: DC | PRN
  Administered 2016-07-05: 400 [IU] via INTRAVENOUS

## 2016-07-05 MED ORDER — OXYCODONE HCL 5 MG/5ML PO SOLN: 5.0000 mg | Freq: Once | ORAL | Status: DC | PRN

## 2016-07-05 MED ORDER — GABAPENTIN 300 MG PO CAPS
ORAL_CAPSULE | ORAL | Status: AC
  Filled 2016-07-05: qty 1

## 2016-07-05 SURGICAL SUPPLY — 47 items
BAG DECANTER FOR FLEXI CONT (MISCELLANEOUS) ×2 IMPLANT
BENZOIN TINCTURE PRP APPL 2/3 (GAUZE/BANDAGES/DRESSINGS) ×2 IMPLANT
BLADE SURG 15 STRL LF DISP TIS (BLADE) ×1 IMPLANT
BLADE SURG 15 STRL SS (BLADE) ×1
CHLORAPREP W/TINT 26ML (MISCELLANEOUS) ×2 IMPLANT
CLEANER CAUTERY TIP 5X5 PAD (MISCELLANEOUS) ×1 IMPLANT
COVER BACK TABLE 60X90IN (DRAPES) ×2 IMPLANT
COVER MAYO STAND STRL (DRAPES) ×2 IMPLANT
COVER PROBE 5X48 (MISCELLANEOUS)
DECANTER SPIKE VIAL GLASS SM (MISCELLANEOUS) IMPLANT
DERMABOND ADVANCED (GAUZE/BANDAGES/DRESSINGS) ×1
DERMABOND ADVANCED .7 DNX12 (GAUZE/BANDAGES/DRESSINGS) ×1 IMPLANT
DRAPE C-ARM 42X72 X-RAY (DRAPES) ×2 IMPLANT
DRAPE LAPAROTOMY T 102X78X121 (DRAPES) ×2 IMPLANT
DRAPE UTILITY XL STRL (DRAPES) ×2 IMPLANT
ELECT REM PT RETURN 9FT ADLT (ELECTROSURGICAL) ×2
ELECTRODE REM PT RTRN 9FT ADLT (ELECTROSURGICAL) ×1 IMPLANT
GAUZE SPONGE 4X4 12PLY STRL LF (GAUZE/BANDAGES/DRESSINGS) ×4 IMPLANT
GLOVE BIOGEL PI IND STRL 7.0 (GLOVE) ×2 IMPLANT
GLOVE BIOGEL PI INDICATOR 7.0 (GLOVE) ×2
GLOVE ECLIPSE 6.5 STRL STRAW (GLOVE) ×2 IMPLANT
GLOVE SURG SIGNA 7.5 PF LTX (GLOVE) ×2 IMPLANT
GOWN STRL REUS W/ TWL LRG LVL3 (GOWN DISPOSABLE) ×1 IMPLANT
GOWN STRL REUS W/ TWL XL LVL3 (GOWN DISPOSABLE) ×1 IMPLANT
GOWN STRL REUS W/TWL LRG LVL3 (GOWN DISPOSABLE) ×1
GOWN STRL REUS W/TWL XL LVL3 (GOWN DISPOSABLE) ×1
IV CATH AUTO 14GX1.75 SAFE ORG (IV SOLUTION) IMPLANT
IV CATH PLACEMENT UNIT 16 GA (IV SOLUTION) IMPLANT
IV KIT MINILOC 20X1 SAFETY (NEEDLE) IMPLANT
KIT CVR 48X5XPRB PLUP LF (MISCELLANEOUS) IMPLANT
KIT PORT POWER 8FR ISP CVUE (Catheter) ×2 IMPLANT
NEEDLE HYPO 25X1 1.5 SAFETY (NEEDLE) ×2 IMPLANT
PACK BASIN DAY SURGERY FS (CUSTOM PROCEDURE TRAY) ×2 IMPLANT
PAD CLEANER CAUTERY TIP 5X5 (MISCELLANEOUS) ×1
PENCIL BUTTON HOLSTER BLD 10FT (ELECTRODE) ×2 IMPLANT
SET SHEATH INTRODUCER 10FR (MISCELLANEOUS) IMPLANT
SHEATH COOK PEEL AWAY SET 9F (SHEATH) IMPLANT
SLEEVE SCD COMPRESS KNEE MED (MISCELLANEOUS) ×2 IMPLANT
STRIP CLOSURE SKIN 1/4X4 (GAUZE/BANDAGES/DRESSINGS) ×2 IMPLANT
SUT ETHILON 2 0 FS 18 (SUTURE) IMPLANT
SUT ETHILON 3 0 PS 1 (SUTURE) IMPLANT
SUT MNCRL AB 4-0 PS2 18 (SUTURE) ×2 IMPLANT
SUT VICRYL 3-0 CR8 SH (SUTURE) ×2 IMPLANT
SYR 5ML LUER SLIP (SYRINGE) ×2 IMPLANT
SYR CONTROL 10ML LL (SYRINGE) ×2 IMPLANT
TOWEL OR 17X24 6PK STRL BLUE (TOWEL DISPOSABLE) ×2 IMPLANT
TOWEL OR NON WOVEN STRL DISP B (DISPOSABLE) ×2 IMPLANT

## 2016-07-05 NOTE — Interval H&P Note (Signed)
History and Physical Interval Note:  07/05/2016 2:02 PM  Candice [name]  has presented today for surgery, with the diagnosis of RIGHT BREAST CANCER  The various methods of treatment have been discussed with the patient and family.   Her mother and sister are here with her.  After consideration of risks, benefits and other options for treatment, the patient has consented to  Procedure(s): INSERTION PORT-A-CATH WITH [place] (N/A) as a surgical intervention .  The patient's history has been reviewed, patient examined, no change in status, stable for surgery.  I have reviewed the patient's chart and labs.  Questions were answered to the patient's satisfaction.     NEWMAN,DAVID H

## 2016-07-05 NOTE — Anesthesia Postprocedure Evaluation (Signed)
Anesthesia Post Note  Patient: Candice [name]  Procedure(s) Performed: Procedure(s) (LRB): INSERTION PORT-A-CATH WITH [place] (N/A)  Patient location during evaluation: PACU Anesthesia Type: General Level of consciousness: awake and alert Pain management: pain level controlled Vital Signs Assessment: post-procedure vital signs reviewed and stable Respiratory status: spontaneous breathing, nonlabored ventilation and respiratory function stable Cardiovascular status: blood pressure returned to baseline and stable Postop Assessment: no signs of nausea or vomiting Anesthetic complications: no       Last Vitals:  Vitals:   07/05/16 1545 07/05/16 1600  BP: 116/80 115/86  Pulse: (!) 57 (!) 56  Resp: 16 14  Temp:      Last Pain:  Vitals:   07/05/16 1600  TempSrc:   PainSc: Asleep                 Lynda Rainwater

## 2016-07-05 NOTE — Discharge Instructions (Signed)
[facility] - DISCHARGE INSTRUCTIONS TO PATIENT  Activity:  Driving - May drive tomorrow, if doing well   Lifting - Take it easy for 5 days, then no limit to lifing  Wound Care:   Leave incisions dry for 2 days, then may shower  Diet:  As tolerated  Follow up appointment:  Call Dr. Pollie Friar office [facility]) at [phone] for an appointment in 6 months  Medications and dosages:  Resume your home medications.  You have a prescription for:  EMLA cream - apply this to the power port site (left chest about 30 - 60 minutes before chemotx)  Call Dr. Lucia Gaskins or his office  [phone]) if you have:  Temperature greater than 100.4,  Redness, tenderness, or signs of infection (pain, swelling, redness, odor or green/yellow discharge around the site),  Difficulty breathing, headache or visual disturbances,  Any other questions or concerns you may have after discharge.  In an emergency, call 911 or go to an Emergency Department at a nearby hospital.   Post Anesthesia Home Care Instructions  Activity: Get plenty of rest for the remainder of the day. A responsible individual must stay with you for 24 hours following the procedure.  For the next 24 hours, DO NOT: -Drive a car -Paediatric nurse -Drink alcoholic beverages -Take any medication unless instructed by your physician -Make any legal decisions or sign important papers.  Meals: Start with liquid foods such as gelatin or soup. Progress to regular foods as tolerated. Avoid greasy, spicy, heavy foods. If nausea and/or vomiting occur, drink only clear liquids until the nausea and/or vomiting subsides. Call your physician if vomiting continues.  Special Instructions/Symptoms: Your throat may feel dry or sore from the anesthesia or the breathing tube placed in your throat during surgery. If this causes discomfort, gargle with warm salt water. The discomfort should disappear within 24 hours.  If you had  a scopolamine patch placed behind your ear for the management of post- operative nausea and/or vomiting:  1. The medication in the patch is effective for 72 hours, after which it should be removed.  Wrap patch in a tissue and discard in the trash. Wash hands thoroughly with soap and water. 2. You may remove the patch earlier than 72 hours if you experience unpleasant side effects which may include dry mouth, dizziness or visual disturbances. 3. Avoid touching the patch. Wash your hands with soap and water after contact with the patch.

## 2016-07-05 NOTE — Op Note (Signed)
07/05/2016  2:59 PM  PATIENT:  Candice [name], 41 y.o., female MRN: [id number] DOB: 02-Jan-1976  PREOP DIAGNOSIS:  RIGHT BREAST CANCER, anticipate chemotherapy  POSTOP DIAGNOSIS:   Right breast cancer, anticipate chemotherapy  PROCEDURE:   Procedure(s): INSERTION Left Subclavian power port  SURGEON:   Alphonsa Overall, M.D.  ANESTHESIA:   general  Anesthesiologist: Lynda Rainwater, MD CRNA: Ernesta Amble Blocker, CRNA  General  EBL:  Minimal  ml  COUNTS CORRECT:  YES  INDICATIONS FOR PROCEDURE:  [name] is a 41 y.o. (DOB: 1975/12/13) AA female whose primary care physician is Lilian Coma, MD and comes for power port placement for the treatment of right breast cancer.  Dr. Jana Hakim and Isidore Moos are her treating oncologists.   She had a right breast lumpectomy with right axillary SLNBx on 06/07/2016 which showed an 1.8 cm IDC with 2/3 nodoes positive.   Her Mammoprint score was high, so she is now planning chemotx.   The indications and risks of the surgery were explained to the patient.  The risks include, but are not limited to, infection, bleeding, pneumothorax, nerve injury, and thrombosis of the vein.  OPERATIVE NOTE:  The patient was taken to Room #7 at [facility].  Anesthesia was provided by Anesthesiologist: Lynda Rainwater, MD CRNA: Ernesta Amble Blocker, CRNA.  At the beginning of the operation, the patient was given 2 gm Ancef, had a roll placed under her back, and had the upper chest/neck prepped with Chloroprep and draped.   A time out was held and the surgery checklist reviewed.   The patient was placed in Trendelenburg position.  The left subclavian vein was accessed with a 16 gauge needle and a guide wire threaded through the needle into the vein.  The position of the wire was checked with fluoroscopy.   I then developed a pocket in the upper inner aspect of the left chest for the port reservoir.  I used the [employer] for venous access.  The  reservoir was sewn in place with a 3-0 Vicryl suture.  The reservoir had been flushed with dilute (10 units/cc) heparin.   I then passed the silastic tubing from the reservoir incision to the subclavian stick site and used the 8 French introducer to pass it into the vein.  The tip of the silastic catheter was position at the junction of the SVC and the right atrium under fluoroscopy.  The silastic catheter was then attached to the port with the bayonet device.     The entire port and tubing were checked with fluoroscopy and then the port was flushed with 4 cc of concentrated heparin (100 units/cc).   The wounds were then closed with 3-0 vicryl subcutaneous sutures and the skin closed with a 4-0 Monocryl suture.  The skin was painted with DermaBond.   The patient was transferred to the recovery room in good condition.  The sponge and needle count were correct at the end of the case.  A CXR is ordered for port placement and pending at the time of this note.  Alphonsa Overall, MD, [facility] Pager: [phone] Office phone:  [phone]

## 2016-07-05 NOTE — Transfer of Care (Signed)
Immediate Anesthesia Transfer of Care Note  Patient: Candice [name]  Procedure(s) Performed: Procedure(s): INSERTION PORT-A-CATH WITH [place] (N/A)  Patient Location: PACU  Anesthesia Type:General  Level of Consciousness: awake and patient cooperative  Airway & Oxygen Therapy: Patient Spontanous Breathing and Patient connected to face mask oxygen  Post-op Assessment: Report given to RN and Post -op Vital signs reviewed and stable  Post vital signs: Reviewed and stable  Last Vitals:  Vitals:   07/05/16 1334 07/05/16 1503  BP: 103/65 (P) 103/69  Pulse: 71 72  Resp: 18 12  Temp: 37.4 C (P) 36.6 C    Last Pain:  Vitals:   07/05/16 1334  TempSrc: Oral  PainSc: 0-No pain         Complications: No apparent anesthesia complications

## 2016-07-05 NOTE — Anesthesia Preprocedure Evaluation (Signed)
Anesthesia Evaluation  Patient identified by MRN, date of birth, ID band Patient awake    Airway Mallampati: II  TM Distance: >3 FB Neck ROM: Full    Dental  (+) Caps   Pulmonary neg pulmonary ROS,    Pulmonary exam normal breath sounds clear to auscultation       Cardiovascular negative cardio ROS Normal cardiovascular exam Rhythm:Regular Rate:Normal     Neuro/Psych Anxiety negative neurological ROS  negative psych ROS   GI/Hepatic Neg liver ROS, Constipation   Endo/Other  Right breast Ca  Renal/GU negative Renal ROS  negative genitourinary   Musculoskeletal negative musculoskeletal ROS (+)   Abdominal   Peds  Hematology negative hematology ROS (+)   Anesthesia Other Findings   Reproductive/Obstetrics negative OB ROS                             Anesthesia Physical  Anesthesia Plan  ASA: II  Anesthesia Plan: General   Post-op Pain Management:  Regional for Post-op pain   Induction:   Airway Management Planned: LMA  Additional Equipment:   Intra-op Plan:   Post-operative Plan: Extubation in OR  Informed Consent: I have reviewed the patients History and Physical, chart, labs and discussed the procedure including the risks, benefits and alternatives for the proposed anesthesia with the patient or authorized representative who has indicated his/her understanding and acceptance.   Dental advisory given  Plan Discussed with: Anesthesiologist, CRNA and Surgeon  Anesthesia Plan Comments:         Anesthesia Quick Evaluation

## 2016-07-05 NOTE — Anesthesia Procedure Notes (Signed)
Procedure Name: LMA Insertion Date/Time: 07/05/2016 2:11 PM Performed by: BLOCKER, TIMOTHY D Pre-anesthesia Checklist: Patient identified, Emergency Drugs available, Suction available and Patient being monitored Patient Re-evaluated:Patient Re-evaluated prior to inductionOxygen Delivery Method: Circle system utilized Preoxygenation: Pre-oxygenation with 100% oxygen Intubation Type: IV induction Ventilation: Mask ventilation without difficulty LMA: LMA inserted LMA Size: 3.0 Number of attempts: 1 Airway Equipment and Method: Bite block Placement Confirmation: positive ETCO2 Tube secured with: Tape Dental Injury: Teeth and Oropharynx as per pre-operative assessment

## 2016-07-06 ENCOUNTER — Telehealth: Payer: Self-pay | Admitting: Oncology

## 2016-07-06 ENCOUNTER — Encounter (HOSPITAL_BASED_OUTPATIENT_CLINIC_OR_DEPARTMENT_OTHER): Payer: Self-pay | Admitting: Surgery

## 2016-07-06 NOTE — Telephone Encounter (Signed)
Per response from managed no pre auth required for echo. Appointment already on schedule.

## 2016-07-07 ENCOUNTER — Other Ambulatory Visit: Payer: Self-pay | Admitting: *Deleted

## 2016-07-07 DIAGNOSIS — Z170 Estrogen receptor positive status [ER+]: Secondary | ICD-10-CM

## 2016-07-07 DIAGNOSIS — C50211 Malignant neoplasm of upper-inner quadrant of right female breast: Secondary | ICD-10-CM

## 2016-07-07 MED ORDER — PROCHLORPERAZINE MALEATE 10 MG PO TABS: 10.0000 mg | ORAL_TABLET | Freq: Four times a day (QID) | ORAL | 1 refills | Status: DC | PRN

## 2016-07-07 MED ORDER — DEXAMETHASONE 4 MG PO TABS: ORAL_TABLET | ORAL | 1 refills | Status: DC

## 2016-07-07 MED ORDER — LIDOCAINE-PRILOCAINE 2.5-2.5 % EX CREA: 1.0000 "application " | TOPICAL_CREAM | CUTANEOUS | 0 refills | Status: DC | PRN

## 2016-07-11 ENCOUNTER — Other Ambulatory Visit: Payer: Self-pay | Admitting: Adult Health

## 2016-07-11 ENCOUNTER — Ambulatory Visit (HOSPITAL_COMMUNITY)
Admission: RE | Admit: 2016-07-11 | Discharge: 2016-07-11 | Disposition: A | Discharge status: Home / Self Care | Payer: Medicaid Other | Source: Ambulatory Visit | Attending: Oncology | Admitting: Oncology

## 2016-07-11 ENCOUNTER — Ambulatory Visit (HOSPITAL_BASED_OUTPATIENT_CLINIC_OR_DEPARTMENT_OTHER)
Admission: RE | Admit: 2016-07-11 | Discharge: 2016-07-11 | Disposition: A | Discharge status: Home / Self Care | Payer: Medicaid Other | Source: Ambulatory Visit | Attending: Internal Medicine | Admitting: Internal Medicine

## 2016-07-11 ENCOUNTER — Encounter (HOSPITAL_COMMUNITY): Payer: Self-pay | Admitting: Internal Medicine

## 2016-07-11 ENCOUNTER — Other Ambulatory Visit: Payer: Self-pay | Admitting: *Deleted

## 2016-07-11 VITALS — BP 114/60 | HR 78 | Wt 136.0 lb

## 2016-07-11 DIAGNOSIS — Z803 Family history of malignant neoplasm of breast: Secondary | ICD-10-CM | POA: Diagnosis not present

## 2016-07-11 DIAGNOSIS — C50211 Malignant neoplasm of upper-inner quadrant of right female breast: Secondary | ICD-10-CM

## 2016-07-11 DIAGNOSIS — Z9221 Personal history of antineoplastic chemotherapy: Secondary | ICD-10-CM | POA: Diagnosis not present

## 2016-07-11 DIAGNOSIS — Z8042 Family history of malignant neoplasm of prostate: Secondary | ICD-10-CM | POA: Insufficient documentation

## 2016-07-11 DIAGNOSIS — K5909 Other constipation: Secondary | ICD-10-CM | POA: Diagnosis not present

## 2016-07-11 DIAGNOSIS — Z800 Family history of malignant neoplasm of digestive organs: Secondary | ICD-10-CM | POA: Diagnosis not present

## 2016-07-11 DIAGNOSIS — Z170 Estrogen receptor positive status [ER+]: Secondary | ICD-10-CM | POA: Insufficient documentation

## 2016-07-11 DIAGNOSIS — I371 Nonrheumatic pulmonary valve insufficiency: Secondary | ICD-10-CM | POA: Insufficient documentation

## 2016-07-11 DIAGNOSIS — Z79899 Other long term (current) drug therapy: Secondary | ICD-10-CM | POA: Diagnosis not present

## 2016-07-11 DIAGNOSIS — Z9889 Other specified postprocedural states: Secondary | ICD-10-CM | POA: Diagnosis not present

## 2016-07-11 DIAGNOSIS — Z808 Family history of malignant neoplasm of other organs or systems: Secondary | ICD-10-CM | POA: Insufficient documentation

## 2016-07-11 DIAGNOSIS — Z880 Allergy status to penicillin: Secondary | ICD-10-CM | POA: Diagnosis not present

## 2016-07-11 MED ORDER — LORATADINE 10 MG PO TABS: 10.0000 mg | ORAL_TABLET | Freq: Every day | ORAL | 1 refills | Status: DC

## 2016-07-11 NOTE — Progress Notes (Signed)
  Echocardiogram 2D Echocardiogram has been performed.  Tresa Res 07/11/2016, 10:50 AM

## 2016-07-11 NOTE — Patient Instructions (Signed)
Your physician recommends that you schedule a follow-up appointment in: 5 weeks with echocardiogram

## 2016-07-11 NOTE — Progress Notes (Addendum)
CARDIO-ONCOLOGY CLINIC CONSULT NOTE  Referring Physician: Magrinat   HPI:  41 y/o woman with PhD in environmental science and policy with right breast cancer referred by Dr. Jana Hakim for enrollment into the [facility].   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  No PMHx except for constipation for which she has had very thorough work-up and told it was stress. Denies any h/o heart disease.  Found to have R breast mass on routine exam with her PCP.   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  Treatment regimen:  (1) started tamoxifen 04/27/2016, Discontinued at the start of chemotherapy  (5) status post right lumpectomy with sentinel lymph node sampling 06/07/2016 for apT1c pN1, stage IB invasive ductal carcinoma, grade 2, with negative margins   (7) doxorubicin and cyclophosphamide in dose dense fashion 4 to started 07/12/2016 followed by paclitaxel weekly 12  (8) adjuvant radiation  to follow   (9) to resume tamoxifen at the completion of local treatment  Plan to start chemo tomorrow. Doing well. A bit fatigued but not terrible. Denies any dyspnea, orthopnea or PND. No edema. Anxious to get back to exercise.   Echo today reviewed personally: EF 55-60% Lateral s' unreadable. GLS - 22.6%  Review of Systems: [y] = yes, _0  = no   General: Weight gain _1 ; Weight loss _2 ; Anorexia _3 ; Fatigue [ y]; Fever _4 ; Chills _5 ; Weakness _6   Cardiac: Chest pain/pressure _7 ; Resting SOB _8 ; Exertional SOB _9 ; Orthopnea _10 ; Pedal Edema _11 ; Palpitations _12 ; Syncope _13 ; Presyncope _14 ; Paroxysmal nocturnal dyspnea_15   Pulmonary: Cough _16 ; Wheezing_17 ; Hemoptysis_18 ; Sputum _19 ; Snoring _20   GI: Vomiting_21 ;  Dysphagia_22 ; Melena_23 ; Hematochezia _24 ; Heartburn_25 ; Abdominal pain _26 ; Constipation _27 ; Diarrhea _28 ; BRBPR _29   GU: Hematuria_30 ; Dysuria _31 ; Nocturia_32   Vascular: Pain in legs with walking _33 ; Pain in feet with lying flat _34 ; Non-healing sores _35 ; Stroke _36 ; TIA _37 ; Slurred speech _38 ;  Neuro: Headaches_39 ; Vertigo_40 ; Seizures_41 ; Paresthesias_42 ;Blurred vision _43 ; Diplopia _44 ; Vision changes _45   Ortho/Skin: Arthritis _46 ; Joint pain _47 ; Muscle pain _48 ; Joint swelling _49 ; Back Pain _50 ; Rash _51   Psych: Depression_52 ; Anxiety_53   Heme: Bleeding problems _54 ; Clotting disorders _55 ; Anemia _56   Endocrine: Diabetes _57 ; Thyroid dysfunction_58    Past Medical History:  Diagnosis Date  . Breast cancer ([place]) 05/2016   right  . Chronic constipation   . Family history of breast cancer   . Family history of ovarian cancer   . Family history of pancreatic cancer   . Family history of prostate cancer   . IBS (irritable bowel syndrome)     Current Outpatient Prescriptions  Medication Sig Dispense Refill  . Coenzyme Q10 (COQ10 PO) Take by mouth. LIQUID    . dexamethasone (DECADRON) 4 MG tablet Take 2 tablets by mouth once a day on the day after chemotherapy and then take 2 tablets two times a day for 2 days. Take with food. 30 tablet 1  . lidocaine-prilocaine (EMLA) cream  Apply 1 application topically as needed. Apply over power port site about 30 to 60 minutes before chemotherapy. 30 g 0  . loratadine (CLARITIN) 10 MG tablet Take 1 tablet (10 mg total) by mouth daily. 30 tablet 1  . LORazepam (ATIVAN) 0.5 MG tablet Take 1 tablet (0.5 mg total) by mouth at bedtime as needed (Nausea or vomiting). 30 tablet 0  . naproxen sodium (ANAPROX) 220 MG tablet Take 220 mg by mouth 2 (two) times daily with a meal.    . OVER THE COUNTER MEDICATION Take 7 tablets by mouth 2 (two) times daily. SUPER COLON CLEANSE    . prochlorperazine (COMPAZINE) 10 MG tablet Take 1 tablet (10 mg total) by  mouth every 6 (six) hours as needed (Nausea or vomiting). 30 tablet 1  . tamoxifen (NOLVADEX) 20 MG tablet Take 1 tablet (20 mg total) by mouth daily. (Patient not taking: Reported on 07/11/2016) 30 tablet 4   No current facility-administered medications for this encounter.     Allergies  Allergen Reactions  . Amoxicillin Rash      Social History   Social History  . Marital status: Single    Spouse name: N/A  . Number of children: N/A  . Years of education: N/A   Occupational History  . Not on file.   Social History Main Topics  . Smoking status: Never Smoker  . Smokeless tobacco: Never Used  . Alcohol use 0.6 oz/week    1 Glasses of wine per week     Comment: socially  . Drug use: No  . Sexual activity: Not Currently    Partners: Male    Birth control/ protection: None   Other Topics Concern  . Not on file   Social History Narrative  . No narrative on file      Family History  Problem Relation Age of Onset  . Breast cancer Mother 70  . Pancreatic cancer Paternal Grandmother   . Prostate cancer Paternal Grandfather   . Ovarian cancer Other     MGMs sister  . Breast cancer Other     mother's maternal first cousin    Vitals:   07/11/16 1415  BP: 114/60  Pulse: 78  SpO2: 100%  Weight: 136 lb (61.7 kg)    PHYSICAL EXAM: General:  Well appearing. No respiratory difficulty HEENT: normal Neck: supple. no JVD. Carotids 2+ bilat; no bruits. No lymphadenopathy or thryomegaly appreciated. Cor: PMI nondisplaced. Regular rate & rhythm. No rubs, gallops or murmurs. Lungs: clear Abdomen: soft, nontender, nondistended. No hepatosplenomegaly. No bruits or masses. Good bowel sounds. Extremities: no cyanosis, clubbing, rash, edema Neuro: alert & oriented x 3, cranial nerves grossly intact. moves all 4 extremities w/o difficulty. Affect pleasant.  ASSESSMENT & PLAN:  1. Right breast CA   - path; invasive ductal carcinoma, grade 2, estrogen receptor 95% positive,  progesterone receptor 100% positive, with strong staining intensity, with an MIB-1 of 20%, and no HER-2 amplification, the signals ratio being 1.71 and the number per cell 3.00.  - now s/p lumpectomy. Plan to start chemo tomorrow  - I reviewed echos personally. EF and Doppler parameters normal. No HF on exam. Will see back in 2 months for repeat echo to ensure stability. Will also f/u with echos at end of adriamycin and then 6-9 months post chemo. I explained incidence of Adriamycin cardiotoxicity in detail include small possibility of very delayed toxicity.   Glori Bickers, MD  11:21 PM

## 2016-07-12 ENCOUNTER — Encounter: Payer: Self-pay | Admitting: Oncology

## 2016-07-12 ENCOUNTER — Ambulatory Visit (HOSPITAL_BASED_OUTPATIENT_CLINIC_OR_DEPARTMENT_OTHER): Payer: Medicaid Other | Admitting: Adult Health

## 2016-07-12 ENCOUNTER — Encounter: Payer: Self-pay | Admitting: Adult Health

## 2016-07-12 ENCOUNTER — Ambulatory Visit (HOSPITAL_BASED_OUTPATIENT_CLINIC_OR_DEPARTMENT_OTHER): Payer: Medicaid Other

## 2016-07-12 ENCOUNTER — Other Ambulatory Visit (HOSPITAL_BASED_OUTPATIENT_CLINIC_OR_DEPARTMENT_OTHER): Payer: Medicaid Other

## 2016-07-12 ENCOUNTER — Ambulatory Visit: Payer: Medicaid Other

## 2016-07-12 ENCOUNTER — Encounter: Payer: Self-pay | Admitting: *Deleted

## 2016-07-12 VITALS — BP 121/72 | HR 66 | Temp 98.2°F | Resp 20 | Wt 134.3 lb

## 2016-07-12 DIAGNOSIS — Z1379 Encounter for other screening for genetic and chromosomal anomalies: Secondary | ICD-10-CM

## 2016-07-12 DIAGNOSIS — Z5111 Encounter for antineoplastic chemotherapy: Secondary | ICD-10-CM | POA: Diagnosis not present

## 2016-07-12 DIAGNOSIS — K5909 Other constipation: Secondary | ICD-10-CM | POA: Diagnosis not present

## 2016-07-12 DIAGNOSIS — C50211 Malignant neoplasm of upper-inner quadrant of right female breast: Secondary | ICD-10-CM

## 2016-07-12 DIAGNOSIS — Z800 Family history of malignant neoplasm of digestive organs: Secondary | ICD-10-CM

## 2016-07-12 DIAGNOSIS — Z170 Estrogen receptor positive status [ER+]: Secondary | ICD-10-CM

## 2016-07-12 DIAGNOSIS — Z803 Family history of malignant neoplasm of breast: Secondary | ICD-10-CM

## 2016-07-12 DIAGNOSIS — Z8041 Family history of malignant neoplasm of ovary: Secondary | ICD-10-CM

## 2016-07-12 DIAGNOSIS — Z8042 Family history of malignant neoplasm of prostate: Secondary | ICD-10-CM

## 2016-07-12 LAB — CBC WITH DIFFERENTIAL/PLATELET
BASO%: 0.2 % (ref 0.0–2.0)
Basophils Absolute: 0 10*3/uL (ref 0.0–0.1)
EOS%: 2 % (ref 0.0–7.0)
Eosinophils Absolute: 0.1 10*3/uL (ref 0.0–0.5)
HCT: 37.6 % (ref 34.8–46.6)
HGB: 13.4 g/dL (ref 11.6–15.9)
LYMPH%: 45.6 % (ref 14.0–49.7)
MCH: 31.8 pg (ref 25.1–34.0)
MCHC: 35.6 g/dL (ref 31.5–36.0)
MCV: 89.3 fL (ref 79.5–101.0)
MONO#: 0.4 10*3/uL (ref 0.1–0.9)
MONO%: 7.1 % (ref 0.0–14.0)
NEUT#: 2.2 10*3/uL (ref 1.5–6.5)
NEUT%: 45.1 % (ref 38.4–76.8)
Platelets: 210 10*3/uL (ref 145–400)
RBC: 4.21 10*6/uL (ref 3.70–5.45)
RDW: 12.7 % (ref 11.2–14.5)
WBC: 5 10*3/uL (ref 3.9–10.3)
lymph#: 2.3 10*3/uL (ref 0.9–3.3)
nRBC: 0 % (ref 0–0)

## 2016-07-12 LAB — COMPREHENSIVE METABOLIC PANEL
ALT: 12 U/L (ref 0–55)
AST: 21 U/L (ref 5–34)
Albumin: 3.9 g/dL (ref 3.5–5.0)
Alkaline Phosphatase: 44 U/L (ref 40–150)
Anion Gap: 9 mEq/L (ref 3–11)
BUN: 6.8 mg/dL — ABNORMAL LOW (ref 7.0–26.0)
CO2: 22 mEq/L (ref 22–29)
Calcium: 9.3 mg/dL (ref 8.4–10.4)
Chloride: 110 mEq/L — ABNORMAL HIGH (ref 98–109)
Creatinine: 0.8 mg/dL (ref 0.6–1.1)
EGFR: >90 mL/min/{1.73_m2} (ref >90)
Glucose: 71 mg/dl (ref 70–140)
Potassium: 3.9 mEq/L (ref 3.5–5.1)
Sodium: 141 mEq/L (ref 136–145)
Total Bilirubin: 0.7 mg/dL (ref 0.20–1.20)
Total Protein: 6.8 g/dL (ref 6.4–8.3)

## 2016-07-12 MED ORDER — SODIUM CHLORIDE 0.9 % IV SOLN
Freq: Once | INTRAVENOUS | Status: AC
  Administered 2016-07-12: 12:00:00 via INTRAVENOUS

## 2016-07-12 MED ORDER — PALONOSETRON HCL INJECTION 0.25 MG/5ML
INTRAVENOUS | Status: AC
  Filled 2016-07-12: qty 5

## 2016-07-12 MED ORDER — SODIUM CHLORIDE 0.9% FLUSH
10.0000 mL | INTRAVENOUS | Status: DC | PRN
  Administered 2016-07-12: 10 mL
  Filled 2016-07-12: qty 10

## 2016-07-12 MED ORDER — PEGFILGRASTIM 6 MG/0.6ML ~~LOC~~ PSKT
6.0000 mg | PREFILLED_SYRINGE | Freq: Once | SUBCUTANEOUS | Status: AC
  Administered 2016-07-12: 6 mg via SUBCUTANEOUS
  Filled 2016-07-12: qty 0.6

## 2016-07-12 MED ORDER — DOXORUBICIN HCL CHEMO IV INJECTION 2 MG/ML
60.0000 mg/m2 | Freq: Once | INTRAVENOUS | Status: AC
  Administered 2016-07-12: 102 mg via INTRAVENOUS
  Filled 2016-07-12: qty 51

## 2016-07-12 MED ORDER — PALONOSETRON HCL INJECTION 0.25 MG/5ML
0.2500 mg | Freq: Once | INTRAVENOUS | Status: AC
Start: 2016-07-12 — End: 2016-07-12
  Administered 2016-07-12: 0.25 mg via INTRAVENOUS

## 2016-07-12 MED ORDER — FOSAPREPITANT DIMEGLUMINE INJECTION 150 MG
Freq: Once | INTRAVENOUS | Status: AC
  Administered 2016-07-12: 12:00:00 via INTRAVENOUS
  Filled 2016-07-12: qty 5

## 2016-07-12 MED ORDER — HEPARIN SOD (PORK) LOCK FLUSH 100 UNIT/ML IV SOLN
500.0000 [IU] | Freq: Once | INTRAVENOUS | Status: AC | PRN
  Administered 2016-07-12: 500 [IU]
  Filled 2016-07-12: qty 5

## 2016-07-12 MED ORDER — SODIUM CHLORIDE 0.9 % IV SOLN
590.0000 mg/m2 | Freq: Once | INTRAVENOUS | Status: AC
  Administered 2016-07-12: 1000 mg via INTRAVENOUS
  Filled 2016-07-12: qty 50

## 2016-07-12 NOTE — Progress Notes (Signed)
Patient came in to bring letter of support and states she is now on chemotherapy. Patient approved for one-time $1000 grant. Patient given an expense sheet and copy of the approval letter. Patient has my card for any additional financial questions or concerns.

## 2016-07-12 NOTE — Patient Instructions (Signed)
Implanted Port Home Guide An implanted port is a type of central line that is placed under the skin. Central lines are used to provide IV access when treatment or nutrition needs to be given through a person's veins. Implanted ports are used for long-term IV access. An implanted port may be placed because:  You need IV medicine that would be irritating to the small veins in your hands or arms.  You need long-term IV medicines, such as antibiotics.  You need IV nutrition for a long period.  You need frequent blood draws for lab tests.  You need dialysis.  Implanted ports are usually placed in the chest area, but they can also be placed in the upper arm, the abdomen, or the leg. An implanted port has two main parts:  Reservoir. The reservoir is round and will appear as a small, raised area under your skin. The reservoir is the part where a needle is inserted to give medicines or draw blood.  Catheter. The catheter is a thin, flexible tube that extends from the reservoir. The catheter is placed into a large vein. Medicine that is inserted into the reservoir goes into the catheter and then into the vein.  How will I care for my incision site? Do not get the incision site wet. Bathe or shower as directed by your health care provider. How is my port accessed? Special steps must be taken to access the port:  Before the port is accessed, a numbing cream can be placed on the skin. This helps numb the skin over the port site.  Your health care provider uses a sterile technique to access the port. ? Your health care provider must put on a mask and sterile gloves. ? The skin over your port is cleaned carefully with an antiseptic and allowed to dry. ? The port is gently pinched between sterile gloves, and a needle is inserted into the port.  Only "non-coring" port needles should be used to access the port. Once the port is accessed, a blood return should be checked. This helps ensure that the port  is in the vein and is not clogged.  If your port needs to remain accessed for a constant infusion, a clear (transparent) bandage will be placed over the needle site. The bandage and needle will need to be changed every week, or as directed by your health care provider.  Keep the bandage covering the needle clean and dry. Do not get it wet. Follow your health care provider's instructions on how to take a shower or bath while the port is accessed.  If your port does not need to stay accessed, no bandage is needed over the port.  What is flushing? Flushing helps keep the port from getting clogged. Follow your health care provider's instructions on how and when to flush the port. Ports are usually flushed with saline solution or a medicine called heparin. The need for flushing will depend on how the port is used.  If the port is used for intermittent medicines or blood draws, the port will need to be flushed: ? After medicines have been given. ? After blood has been drawn. ? As part of routine maintenance.  If a constant infusion is running, the port may not need to be flushed.  How long will my port stay implanted? The port can stay in for as long as your health care provider thinks it is needed. When it is time for the port to come out, surgery will be   done to remove it. The procedure is similar to the one performed when the port was put in. When should I seek immediate medical care? When you have an implanted port, you should seek immediate medical care if:  You notice a bad smell coming from the incision site.  You have swelling, redness, or drainage at the incision site.  You have more swelling or pain at the port site or the surrounding area.  You have a fever that is not controlled with medicine.  This information is not intended to replace advice given to you by your health care provider. Make sure you discuss any questions you have with your health care provider. Document  Released: 03/14/2005 Document Revised: 08/20/2015 Document Reviewed: 11/19/2012 Elsevier Interactive Patient Education  2017 [employer]  

## 2016-07-12 NOTE — Progress Notes (Signed)
Pt completed first time adria/cytoxan infusion today without any complaints or complications. Pt encouraged to drink plenty of fluids throughout the week. Reviewed diet, physical activity, self care and medications at home.Advised pt that neulasta onpro will go off in 27 hrs. Pt instructed to make sure that she takes claritin to prevent bone pain.  Pt verbalized understanding and confirmed medication administration for nausea/vomiting. Reviewed critical symptoms and when to go to the ED. Follow up chemo phone call placed. Pt questions/concerns addressed at this time.

## 2016-07-12 NOTE — Patient Instructions (Addendum)
[facility] Discharge Instructions for Patients Receiving Chemotherapy  Today you received the following chemotherapy agents Adriamycin and Cytoxan  To help prevent nausea and vomiting after your treatment, we encourage you to take your nausea medication as directed by your MD.   If you develop nausea and vomiting that is not controlled by your nausea medication, call the clinic.   BELOW ARE SYMPTOMS THAT SHOULD BE REPORTED IMMEDIATELY:  *FEVER GREATER THAN 100.5 F  *CHILLS WITH OR WITHOUT FEVER  NAUSEA AND VOMITING THAT IS NOT CONTROLLED WITH YOUR NAUSEA MEDICATION  *UNUSUAL SHORTNESS OF BREATH  *UNUSUAL BRUISING OR BLEEDING  TENDERNESS IN MOUTH AND THROAT WITH OR WITHOUT PRESENCE OF ULCERS  *URINARY PROBLEMS  *BOWEL PROBLEMS  UNUSUAL RASH Items with * indicate a potential emergency and should be followed up as soon as possible.  Feel free to call the clinic you have any questions or concerns. The clinic phone number is [phone].  Please show the [facility] at check-in to the Emergency Department and triage nurse.   Doxorubicin injection (ADRIAMYCIN) What is this medicine? DOXORUBICIN (dox oh ROO bi sin) is a chemotherapy drug. It is used to treat many kinds of cancer like leukemia, lymphoma, neuroblastoma, sarcoma, and Wilms' tumor. It is also used to treat bladder cancer, breast cancer, lung cancer, ovarian cancer, stomach cancer, and thyroid cancer. This medicine may be used for other purposes; ask your health care provider or pharmacist if you have questions. COMMON BRAND NAME(S): Adriamycin, Adriamycin PFS, Adriamycin RDF, Rubex What should I tell my health care provider before I take this medicine? They need to know if you have any of these conditions: -heart disease -history of low blood counts caused by a medicine -liver disease -recent or ongoing radiation therapy -an unusual or allergic reaction to doxorubicin, other chemotherapy  agents, other medicines, foods, dyes, or preservatives -pregnant or trying to get pregnant -breast-feeding How should I use this medicine? This drug is given as an infusion into a vein. It is administered in a hospital or clinic by a specially trained health care professional. If you have pain, swelling, burning or any unusual feeling around the site of your injection, tell your health care professional right away. Talk to your pediatrician regarding the use of this medicine in children. Special care may be needed. Overdosage: If you think you have taken too much of this medicine contact a poison control center or emergency room at once. NOTE: This medicine is only for you. Do not share this medicine with others. What if I miss a dose? It is important not to miss your dose. Call your doctor or health care professional if you are unable to keep an appointment. What may interact with this medicine? This medicine may interact with the following medications: -6-mercaptopurine -paclitaxel -phenytoin -St. John's Wort -trastuzumab -verapamil This list may not describe all possible interactions. Give your health care provider a list of all the medicines, herbs, non-prescription drugs, or dietary supplements you use. Also tell them if you smoke, drink alcohol, or use illegal drugs. Some items may interact with your medicine. What should I watch for while using this medicine? This drug may make you feel generally unwell. This is not uncommon, as chemotherapy can affect healthy cells as well as cancer cells. Report any side effects. Continue your course of treatment even though you feel ill unless your doctor tells you to stop. There is a maximum amount of this medicine you should receive throughout your life.  The amount depends on the medical condition being treated and your overall health. Your doctor will watch how much of this medicine you receive in your lifetime. Tell your doctor if you have taken  this medicine before. You may need blood work done while you are taking this medicine. Your urine may turn red for a few days after your dose. This is not blood. If your urine is dark or brown, call your doctor. In some cases, you may be given additional medicines to help with side effects. Follow all directions for their use. Call your doctor or health care professional for advice if you get a fever, chills or sore throat, or other symptoms of a cold or flu. Do not treat yourself. This drug decreases your body's ability to fight infections. Try to avoid being around people who are sick. This medicine may increase your risk to bruise or bleed. Call your doctor or health care professional if you notice any unusual bleeding. Talk to your doctor about your risk of cancer. You may be more at risk for certain types of cancers if you take this medicine. Do not become pregnant while taking this medicine or for 6 months after stopping it. Women should inform their doctor if they wish to become pregnant or think they might be pregnant. Men should not father a child while taking this medicine and for 6 months after stopping it. There is a potential for serious side effects to an unborn child. Talk to your health care professional or pharmacist for more information. Do not breast-feed an infant while taking this medicine. This medicine has caused ovarian failure in some women and reduced sperm counts in some men This medicine may interfere with the ability to have a child. Talk with your doctor or health care professional if you are concerned about your fertility. What side effects may I notice from receiving this medicine? Side effects that you should report to your doctor or health care professional as soon as possible: -allergic reactions like skin rash, itching or hives, swelling of the face, lips, or tongue -breathing problems -chest pain -fast or irregular heartbeat -low blood counts - this medicine may  decrease the number of white blood cells, red blood cells and platelets. You may be at increased risk for infections and bleeding. -pain, redness, or irritation at site where injected -signs of infection - fever or chills, cough, sore throat, pain or difficulty passing urine -signs of decreased platelets or bleeding - bruising, pinpoint red spots on the skin, black, tarry stools, blood in the urine -swelling of the ankles, feet, hands -tiredness -weakness Side effects that usually do not require medical attention (report to your doctor or health care professional if they continue or are bothersome): -diarrhea -hair loss -mouth sores -nail discoloration or damage -nausea -red colored urine -vomiting This list may not describe all possible side effects. Call your doctor for medical advice about side effects. You may report side effects to FDA at 1-800-FDA-1088. Where should I keep my medicine? This drug is given in a hospital or clinic and will not be stored at home. NOTE: This sheet is a summary. It may not cover all possible information. If you have questions about this medicine, talk to your doctor, pharmacist, or health care provider.  2018 Elsevier/Gold Standard (2015-05-11 11:28:51)    Cyclophosphamide injection CYTOXAN What is this medicine? CYCLOPHOSPHAMIDE (sye kloe FOSS fa mide) is a chemotherapy drug. It slows the growth of cancer cells. This medicine is used to  treat many types of cancer like lymphoma, myeloma, leukemia, breast cancer, and ovarian cancer, to name a few. This medicine may be used for other purposes; ask your health care provider or pharmacist if you have questions. COMMON BRAND NAME(S): Cytoxan, Neosar What should I tell my health care provider before I take this medicine? They need to know if you have any of these conditions: -blood disorders -history of other chemotherapy -infection -kidney disease -liver disease -recent or ongoing radiation  therapy -tumors in the bone marrow -an unusual or allergic reaction to cyclophosphamide, other chemotherapy, other medicines, foods, dyes, or preservatives -pregnant or trying to get pregnant -breast-feeding How should I use this medicine? This drug is usually given as an injection into a vein or muscle or by infusion into a vein. It is administered in a hospital or clinic by a specially trained health care professional. Talk to your pediatrician regarding the use of this medicine in children. Special care may be needed. Overdosage: If you think you have taken too much of this medicine contact a poison control center or emergency room at once. NOTE: This medicine is only for you. Do not share this medicine with others. What if I miss a dose? It is important not to miss your dose. Call your doctor or health care professional if you are unable to keep an appointment. What may interact with this medicine? This medicine may interact with the following medications: -amiodarone -amphotericin B -azathioprine -certain antiviral medicines for HIV or AIDS such as protease inhibitors (e.g., indinavir, ritonavir) and zidovudine -certain blood pressure medications such as benazepril, captopril, enalapril, fosinopril, lisinopril, moexipril, monopril, perindopril, quinapril, ramipril, trandolapril -certain cancer medications such as anthracyclines (e.g., daunorubicin, doxorubicin), busulfan, cytarabine, paclitaxel, pentostatin, tamoxifen, trastuzumab -certain diuretics such as chlorothiazide, chlorthalidone, hydrochlorothiazide, indapamide, metolazone -certain medicines that treat or prevent blood clots like warfarin -certain muscle relaxants such as succinylcholine -cyclosporine -etanercept -indomethacin -medicines to increase blood counts like filgrastim, pegfilgrastim, sargramostim -medicines used as general anesthesia -metronidazole -natalizumab This list may not describe all possible  interactions. Give your health care provider a list of all the medicines, herbs, non-prescription drugs, or dietary supplements you use. Also tell them if you smoke, drink alcohol, or use illegal drugs. Some items may interact with your medicine. What should I watch for while using this medicine? Visit your doctor for checks on your progress. This drug may make you feel generally unwell. This is not uncommon, as chemotherapy can affect healthy cells as well as cancer cells. Report any side effects. Continue your course of treatment even though you feel ill unless your doctor tells you to stop. Drink water or other fluids as directed. Urinate often, even at night. In some cases, you may be given additional medicines to help with side effects. Follow all directions for their use. Call your doctor or health care professional for advice if you get a fever, chills or sore throat, or other symptoms of a cold or flu. Do not treat yourself. This drug decreases your body's ability to fight infections. Try to avoid being around people who are sick. This medicine may increase your risk to bruise or bleed. Call your doctor or health care professional if you notice any unusual bleeding. Be careful brushing and flossing your teeth or using a toothpick because you may get an infection or bleed more easily. If you have any dental work done, tell your dentist you are receiving this medicine. You may get drowsy or dizzy. Do not drive, use machinery,  or do anything that needs mental alertness until you know how this medicine affects you. Do not become pregnant while taking this medicine or for 1 year after stopping it. Women should inform their doctor if they wish to become pregnant or think they might be pregnant. Men should not father a child while taking this medicine and for 4 months after stopping it. There is a potential for serious side effects to an unborn child. Talk to your health care professional or pharmacist for  more information. Do not breast-feed an infant while taking this medicine. This medicine may interfere with the ability to have a child. This medicine has caused ovarian failure in some women. This medicine has caused reduced sperm counts in some men. You should talk with your doctor or health care professional if you are concerned about your fertility. If you are going to have surgery, tell your doctor or health care professional that you have taken this medicine. What side effects may I notice from receiving this medicine? Side effects that you should report to your doctor or health care professional as soon as possible: -allergic reactions like skin rash, itching or hives, swelling of the face, lips, or tongue -low blood counts - this medicine may decrease the number of white blood cells, red blood cells and platelets. You may be at increased risk for infections and bleeding. -signs of infection - fever or chills, cough, sore throat, pain or difficulty passing urine -signs of decreased platelets or bleeding - bruising, pinpoint red spots on the skin, black, tarry stools, blood in the urine -signs of decreased red blood cells - unusually weak or tired, fainting spells, lightheadedness -breathing problems -dark urine -dizziness -palpitations -swelling of the ankles, feet, hands -trouble passing urine or change in the amount of urine -weight gain -yellowing of the eyes or skin Side effects that usually do not require medical attention (report to your doctor or health care professional if they continue or are bothersome): -changes in nail or skin color -hair loss -missed menstrual periods -mouth sores -nausea, vomiting This list may not describe all possible side effects. Call your doctor for medical advice about side effects. You may report side effects to FDA at 1-800-FDA-1088. Where should I keep my medicine? This drug is given in a hospital or clinic and will not be stored at  home. NOTE: This sheet is a summary. It may not cover all possible information. If you have questions about this medicine, talk to your doctor, pharmacist, or health care provider.  2018 Elsevier/Gold Standard (2012-01-27 16:22:58)   Pegfilgrastim injection NEULASTA What is this medicine? PEGFILGRASTIM (PEG fil gra stim) is a long-acting granulocyte colony-stimulating factor that stimulates the growth of neutrophils, a type of white blood cell important in the body's fight against infection. It is used to reduce the incidence of fever and infection in patients with certain types of cancer who are receiving chemotherapy that affects the bone marrow, and to increase survival after being exposed to high doses of radiation. This medicine may be used for other purposes; ask your health care provider or pharmacist if you have questions. COMMON BRAND NAME(S): Neulasta What should I tell my health care provider before I take this medicine? They need to know if you have any of these conditions: -kidney disease -latex allergy -ongoing radiation therapy -sickle cell disease -skin reactions to acrylic adhesives (On-Body Injector only) -an unusual or allergic reaction to pegfilgrastim, filgrastim, other medicines, foods, dyes, or preservatives -pregnant or trying to get  pregnant -breast-feeding How should I use this medicine? This medicine is for injection under the skin. If you get this medicine at home, you will be taught how to prepare and give the pre-filled syringe or how to use the On-body Injector. Refer to the patient Instructions for Use for detailed instructions. Use exactly as directed. Tell your healthcare provider immediately if you suspect that the On-body Injector may not have performed as intended or if you suspect the use of the On-body Injector resulted in a missed or partial dose. It is important that you put your used needles and syringes in a special sharps container. Do not put them  in a trash can. If you do not have a sharps container, call your pharmacist or healthcare provider to get one. Talk to your pediatrician regarding the use of this medicine in children. While this drug may be prescribed for selected conditions, precautions do apply. Overdosage: If you think you have taken too much of this medicine contact a poison control center or emergency room at once. NOTE: This medicine is only for you. Do not share this medicine with others. What if I miss a dose? It is important not to miss your dose. Call your doctor or health care professional if you miss your dose. If you miss a dose due to an On-body Injector failure or leakage, a new dose should be administered as soon as possible using a single prefilled syringe for manual use. What may interact with this medicine? Interactions have not been studied. Give your health care provider a list of all the medicines, herbs, non-prescription drugs, or dietary supplements you use. Also tell them if you smoke, drink alcohol, or use illegal drugs. Some items may interact with your medicine. This list may not describe all possible interactions. Give your health care provider a list of all the medicines, herbs, non-prescription drugs, or dietary supplements you use. Also tell them if you smoke, drink alcohol, or use illegal drugs. Some items may interact with your medicine. What should I watch for while using this medicine? You may need blood work done while you are taking this medicine. If you are going to need a MRI, CT scan, or other procedure, tell your doctor that you are using this medicine (On-Body Injector only). What side effects may I notice from receiving this medicine? Side effects that you should report to your doctor or health care professional as soon as possible: -allergic reactions like skin rash, itching or hives, swelling of the face, lips, or tongue -dizziness -fever -pain, redness, or irritation at site where  injected -pinpoint red spots on the skin -red or dark-brown urine -shortness of breath or breathing problems -stomach or side pain, or pain at the shoulder -swelling -tiredness -trouble passing urine or change in the amount of urine Side effects that usually do not require medical attention (report to your doctor or health care professional if they continue or are bothersome): -bone pain -muscle pain This list may not describe all possible side effects. Call your doctor for medical advice about side effects. You may report side effects to FDA at 1-800-FDA-1088. Where should I keep my medicine? Keep out of the reach of children. Store pre-filled syringes in a refrigerator between 2 and 8 degrees C (36 and 46 degrees F). Do not freeze. Keep in carton to protect from light. Throw away this medicine if it is left out of the refrigerator for more than 48 hours. Throw away any unused medicine after the expiration  date. NOTE: This sheet is a summary. It may not cover all possible information. If you have questions about this medicine, talk to your doctor, pharmacist, or health care provider.  2018 [employer]vier/Gold Standard (2016-03-10 12:58:03)

## 2016-07-12 NOTE — Progress Notes (Signed)
[place]  Telephone:([phone]) [phone] Fax:(336) [phone]     ID: Candice [name] DOB: 1975-05-10  MR#: [id number]  DGL#:[id number]  Patient Care Team: Jonathon Jordan, MD as PCP - General (Family Medicine) Alphonsa Overall, MD as Consulting Physician (General Surgery) Chauncey Cruel, MD as Consulting Physician (Oncology) Eppie Gibson, MD as Attending Physician (Radiation Oncology) Osborne Oman, MD as Attending Physician (Obstetrics and Gynecology) Scot Dock, NP OTHER MD:  CHIEF COMPLAINT: estrogen receptor positive breast cancer  CURRENT TREATMENT: Adjuvant chemotherapy   BREAST CANCER HISTORY: From the original intake note:  [name] [facility] the [facility] [place] in December, at which time a mass in the right breast was noted. She was referred for BCCCP and screening mammography was obtained, showing a possible mass in the right breast.on 04/21/2016 she underwent right diagnostic mammography with tomography and ultrasonography at the breast Center, and this confirmedan irregular spiculat in the right breast measuring 1.8 cm, which was not palpable to the mammographer. Ultrasonography confirmed an irregular hypoechoic right breast mass at the 12:30 o'clock position 4 cm from the nipple measuring 2.2 cm. The right axilla was sonographically benign.  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  Her subsequent history is detailed below  INTERVAL HISTORY: Candice [name] is here today prior to receiving her first cycle of Doxorubicin and cyclophosphamide.  She is doing well today.  She tells me that she has chronic constipation that requires her to take super colon cleanse (which is an  herbal supplement) in the morning and in the evening.  She is concerned over the effect the treatment may have over her bowels.  She has undergone full GI work up and has been on many different medications without success.  This is the only thing that works for her.    REVIEW OF SYSTEMS: Candice [name] denies fevers, chills, nausea, vomiting, constipation, diarrhea, peripheral neuropathy, or any further concerns.  A detailed ROS is non contributory.  She understands how to take her anti-nausea medications and has picked them up from the pharmacy.   PAST MEDICAL HISTORY: Past Medical History:  Diagnosis Date  . Breast cancer ([place]) 05/2016   right  . Chronic constipation   . Family history of breast cancer   . Family history of ovarian cancer   . Family history of pancreatic cancer   . Family history of prostate cancer   . IBS (irritable bowel syndrome)     PAST SURGICAL HISTORY: Past Surgical History:  Procedure Laterality Date  . BARTHOLIN CYST MARSUPIALIZATION  09/04/2002  . BARTHOLIN GLAND CYST EXCISION Left 07/13/2005  . BREAST LUMPECTOMY WITH RADIOACTIVE SEED AND SENTINEL LYMPH NODE BIOPSY Right 06/07/2016   Procedure: BREAST LUMPECTOMY WITH RADIOACTIVE SEED AND SENTINEL LYMPH NODE BIOPSY;  Surgeon: Alphonsa Overall, MD;  Location: [facility];  Service: General;  Laterality: Right;  . HYSTEROSCOPY  2016  . MYOMECTOMY  07/20/2004  . OVARIAN CYST REMOVAL Right 07/20/2004  . PORTACATH PLACEMENT N/A 07/05/2016   Procedure: INSERTION PORT-A-CATH WITH [place];  Surgeon: Alphonsa Overall, MD;  Location: [facility];  Service: General;  Laterality: N/A;    FAMILY HISTORY Family History  Problem Relation Age of Onset  . Breast cancer Mother 49  .  Pancreatic cancer Paternal Grandmother   . Prostate cancer Paternal Grandfather   . Ovarian cancer Other     MGMs sister  . Breast cancer Other     mother's maternal first cousin  The patient's father, [name], lives in [place]  and works as a Geneticist, molecular. The patient's mother is a Quarry manager. The patient has no brother, one sister, who works in [place] as a Network engineer. The patient's mother had breast cancer, stage 0, diagnosed at age 3. There are 2 maternal relatives (mother's sister and mother's niece) with breast cancer, both postmenopausal.  GYNECOLOGIC HISTORY:  Menarche age 31, the patient is GX P0. She has regular periods, most recently mid-January. She is on oral contraceptives at present. Patient's last menstrual period was 06/18/2016.   SOCIAL HISTORY:  The patient has a PhD degree and has worked for [employer] here and in [place] but is currently unemployed.  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 She generally lives in [place] by herself although she is currently staying at her parents.    ADVANCED DIRECTIVES: Not in place. The patient tells me she intends to name her sister as her healthcare power of attorney.   HEALTH MAINTENANCE: Social History  Substance Use Topics  . Smoking status: Never Smoker  . Smokeless tobacco: Never Used  . Alcohol use 0.6 oz/week    1 Glasses of wine per week     Comment: socially     Colonoscopy: July 2016, in [place]  PAP:  Bone density: Never   Allergies  Allergen Reactions  . Amoxicillin Rash    Current Outpatient Prescriptions  Medication Sig Dispense Refill  . Coenzyme Q10 (COQ10 PO) Take by mouth. LIQUID    . dexamethasone (DECADRON) 4 MG tablet Take 2 tablets by mouth once a day on the day after chemotherapy and then take 2 tablets two times a day for 2 days. Take with food. 30 tablet 1  . lidocaine-prilocaine (EMLA) cream Apply 1 application topically as needed. Apply over power port site about 30 to 60 minutes before chemotherapy. 30 g 0  . loratadine (CLARITIN) 10 MG tablet Take 1 tablet (10 mg total) by mouth daily. 30 tablet 1  . LORazepam (ATIVAN) 0.5 MG tablet Take 1 tablet (0.5 mg total) by mouth at  bedtime as needed (Nausea or vomiting). 30 tablet 0  . naproxen sodium (ANAPROX) 220 MG tablet Take 220 mg by mouth 2 (two) times daily with a meal.    . OVER THE COUNTER MEDICATION Take 7 tablets by mouth 2 (two) times daily. SUPER COLON CLEANSE    . prochlorperazine (COMPAZINE) 10 MG tablet Take 1 tablet (10 mg total) by mouth every 6 (six) hours as needed (Nausea or vomiting). 30 tablet 1  . tamoxifen (NOLVADEX) 20 MG tablet Take 1 tablet (20 mg total) by mouth daily. (Patient not taking: Reported on 07/11/2016) 30 tablet 4   No current facility-administered medications for this visit.     OBJECTIVE:   Vitals:   07/12/16 1121  BP: 121/72  Pulse: 66  Resp: 20  Temp: 98.2 F (36.8 C)     Body mass index is 23.05 kg/m.    ECOG FS:0 - Asymptomatic  GENERAL: Patient is a well appearing female in no acute distress HEENT:  Sclerae anicteric.  PERRL, Oropharynx clear and moist. No ulcerations or evidence of oropharyngeal candidiasis. Neck is supple.  NODES:  No cervical, supraclavicular, or axillary lymphadenopathy palpated.  BREAST EXAM:  Deferred. LUNGS:  Clear to auscultation bilaterally.  No wheezes or rhonchi. HEART:  Regular rate and rhythm. No murmur appreciated. ABDOMEN:  Soft, nontender.  Positive, normoactive bowel sounds. No organomegaly palpated. MSK:  No focal spinal tenderness to palpation. Full range of motion bilaterally in the upper extremities. EXTREMITIES:  No peripheral edema.   SKIN:  Clear with no obvious rashes or skin changes. No nail dyscrasia. NEURO:  Nonfocal. Well oriented.  Appropriate affect.     LAB RESULTS:  CMP     Component Value Date/Time   NA 142 04/27/2016 0833   K 3.9 04/27/2016 0833   CO2 25 04/27/2016 0833   GLUCOSE 87 04/27/2016 0833   BUN 9.1 04/27/2016 0833   CREATININE 0.8 04/27/2016 0833   CALCIUM 9.5 04/27/2016 0833   PROT 7.2 04/27/2016 0833   ALBUMIN 3.8 04/27/2016 0833   AST 27 04/27/2016 0833   ALT 12 04/27/2016 0833    ALKPHOS 38 (L) 04/27/2016 0833   BILITOT 0.35 04/27/2016 0833    INo results found for: SPEP, UPEP  Lab Results  Component Value Date   WBC 5.0 07/12/2016   NEUTROABS 2.2 07/12/2016   HGB 13.4 07/12/2016   HCT 37.6 07/12/2016   MCV 89.3 07/12/2016   PLT 210 07/12/2016      Chemistry      Component Value Date/Time   NA 142 04/27/2016 0833   K 3.9 04/27/2016 0833   CO2 25 04/27/2016 0833   BUN 9.1 04/27/2016 0833   CREATININE 0.8 04/27/2016 0833      Component Value Date/Time   CALCIUM 9.5 04/27/2016 0833   ALKPHOS 38 (L) 04/27/2016 0833   AST 27 04/27/2016 0833   ALT 12 04/27/2016 0833   BILITOT 0.35 04/27/2016 0833       No results found for: LABCA2  No components found for: LABCA125  No results for input(s): INR in the last 168 hours.  Urinalysis No results found for: COLORURINE, APPEARANCEUR, LABSPEC, PHURINE, GLUCOSEU, HGBUR, BILIRUBINUR, KETONESUR, PROTEINUR, UROBILINOGEN, NITRITE, LEUKOCYTESUR   STUDIES: Dg Chest Port 1 View  Result Date: 07/05/2016 CLINICAL DATA:  Port-A-Cath in place, history of breast carcinoma EXAM: PORTABLE CHEST 1 VIEW COMPARISON:  None. FINDINGS: A left-sided Port-A-Cath is present with the tip seen to the mid SVC. No pneumothorax is seen. No pleural effusion is noted. The heart is within normal limits in size. No bony abnormality is seen. IMPRESSION: 1. Left-sided Port-A-Cath tip overlies the mid SVC. No pneumothorax. 2. No active lung disease. Electronically Signed   By: Dwyane Dee M.D.   On: 07/05/2016 15:59   Dg Fluoro Guide Cv Line-no Report  Result Date: 07/05/2016 Fluoroscopy was utilized by the requesting physician.  No radiographic interpretation.    ELIGIBLE FOR AVAILABLE RESEARCH PROTOCOL: *no  ASSESSMENT: 41 y.o. [facility] woman currently residing in [place], status post right breast upper inner quadrant biopsy 04/21/2016 for a clinical T2 N0, stage IIa invasive ductal carcinoma, grade 2, estrogen and  progesterone receptor positive, HER-2 nonamplified, with an MIB-1 of 20%  (a) biopsy of 2 additional suspicious areas in the right breast 05/12/2016 was benign  (1) started tamoxifen 04/27/2016, Discontinued at the start of chemotherapy  (2) Oncotype DX obtained from the original biopsy showed a score of 14, predicting a 10 year risk of recurrence outside the breast of 9% if the patient's only systemic therapy is tamoxifen for 5 years. It also predicts no benefit from chemotherapy.   (3) patient is not interested in  fertility preservation  (4) genetics testing 04/27/2016 through the [facility] gene panel offered by [facility] found no deleterious mutations in APC, ATM, BARD1, BMPR1A, BRCA1, BRCA2, BRIP1, CHD1, CDK4, CDKN2A, CHEK2, EPCAM (large rearrangement only), MLH1, MSH2, MSH6, MUTYH, NBN, PALB2, PMS2, PTEN, RAD51C, RAD51D, SMAD4, STK11, and TP53. Sequencing was performed for select regions of POLE and POLD1, and large rearrangement analysis was performed for select regions of GREM1.   (5) status post right lumpectomy with sentinel lymph node sampling 06/07/2016 for apT1c pN1, stage IB invasive ductal carcinoma, grade 2, with negative margins   (6) Mammaprint sent from the final surgical sample was read as high risk, indicating a need for chemotherapy   (7) doxorubicin and cyclophosphamide in dose dense fashion 4 to started 07/12/2016 followed by paclitaxel weekly 12  (8) adjuvant radiation  to follow   (9) to resume tamoxifen at the completion of local treatment  PLAN: Candice [name] is doing well today.  Her CBC is stable, CMET is pending.  She will proceed with chemotherapy today.  We discussed oral care and hygeine, I reviewed her CBC with her in detail.  She is planning on picking up claritin today to help prevent any bony pain she may experience from the Neulasta.  Candice [name] will return in one week for labs and evaluation.    She has a good understanding of this plan. She  agrees with it. She knows to call for any problems that may develop before her next visit here.  A total of (30) minutes of face-to-face time was spent with this patient with greater than 50% of that time in counseling and care-coordination.  Scot Dock, NP   07/12/2016 11:32 AM Medical Oncology and Hematology [facility] [address] [place], [place] [address] Tel. [phone]    Fax. [phone]

## 2016-07-13 NOTE — Telephone Encounter (Signed)
Spoke with pt by phone for chemo f/u.  See flow sheet documentation

## 2016-07-15 ENCOUNTER — Encounter: Payer: Self-pay | Admitting: *Deleted

## 2016-07-15 NOTE — Progress Notes (Signed)
[place] Psychosocial Distress Screening Clinical Social Work  Clinical Social Work was referred by distress screening protocol.  The patient scored a 5 on the Psychosocial Distress Thermometer which indicates moderate distress. Clinical Social Worker reviewed chart and phoned pt to assess for distress and other psychosocial needs. CSW introduced self, explained role of CSW/Pt and Family Support Team, support groups and other resources to assist. Pt has met with [place] and was approved for the grant. CSW reviewed additional resources and assistance programs to assist. Pt plans to attend breast cancer support group next week and plans to contact Cancer Care. Pt plans to follow up with CSW team once she obtains Cancer Care application. Pt is living with her parents after losing her job in August of last year. They are very supportive. CSW team to follow and assist.    ONCBCN DISTRESS SCREENING 06/22/2016  Screening Type Initial Screening  Distress experienced in past week (1-10) 5  Practical problem type Work/school  Emotional problem type Adjusting to illness  Information Concerns Type Lack of info about diagnosis;Lack of info about treatment  Physical Problem type Sleep/insomnia;Constipation/diarrhea    Clinical Social Worker follow up needed: Yes.    If yes, follow up plan:  See above Loren Racer, LCSW, OSW-C Clinical Social Worker [place]  [facility] Phone: [phone] Fax: [phone]

## 2016-07-19 ENCOUNTER — Ambulatory Visit (HOSPITAL_BASED_OUTPATIENT_CLINIC_OR_DEPARTMENT_OTHER): Payer: Medicaid Other | Admitting: Adult Health

## 2016-07-19 ENCOUNTER — Encounter: Payer: Self-pay | Admitting: Oncology

## 2016-07-19 ENCOUNTER — Other Ambulatory Visit (HOSPITAL_BASED_OUTPATIENT_CLINIC_OR_DEPARTMENT_OTHER): Payer: Medicaid Other

## 2016-07-19 VITALS — BP 116/71 | HR 81 | Temp 98.1°F | Resp 20 | Ht 64.0 in | Wt 136.3 lb

## 2016-07-19 DIAGNOSIS — Z170 Estrogen receptor positive status [ER+]: Secondary | ICD-10-CM | POA: Diagnosis not present

## 2016-07-19 DIAGNOSIS — C50211 Malignant neoplasm of upper-inner quadrant of right female breast: Secondary | ICD-10-CM | POA: Diagnosis not present

## 2016-07-19 LAB — COMPREHENSIVE METABOLIC PANEL
ALT: 10 U/L (ref 0–55)
AST: 15 U/L (ref 5–34)
Albumin: 3.5 g/dL (ref 3.5–5.0)
Alkaline Phosphatase: 82 U/L (ref 40–150)
Anion Gap: 10 mEq/L (ref 3–11)
BUN: 11.3 mg/dL (ref 7.0–26.0)
CO2: 26 mEq/L (ref 22–29)
Calcium: 8.6 mg/dL (ref 8.4–10.4)
Chloride: 104 mEq/L (ref 98–109)
Creatinine: 0.9 mg/dL (ref 0.6–1.1)
EGFR: 89 mL/min/{1.73_m2} — ABNORMAL LOW (ref >90)
Glucose: 106 mg/dl (ref 70–140)
Potassium: 3.8 mEq/L (ref 3.5–5.1)
Sodium: 140 mEq/L (ref 136–145)
Total Bilirubin: <0.22 mg/dL (ref 0.20–1.20)
Total Protein: 6.3 g/dL — ABNORMAL LOW (ref 6.4–8.3)

## 2016-07-19 LAB — CBC WITH DIFFERENTIAL/PLATELET
BASO%: 0.5 % (ref 0.0–2.0)
Basophils Absolute: 0 10*3/uL (ref 0.0–0.1)
EOS%: 2.1 % (ref 0.0–7.0)
Eosinophils Absolute: 0.1 10*3/uL (ref 0.0–0.5)
HCT: 36 % (ref 34.8–46.6)
HGB: 12.6 g/dL (ref 11.6–15.9)
LYMPH%: 36.1 % (ref 14.0–49.7)
MCH: 31.6 pg (ref 25.1–34.0)
MCHC: 35 g/dL (ref 31.5–36.0)
MCV: 90.2 fL (ref 79.5–101.0)
MONO#: 0.6 10*3/uL (ref 0.1–0.9)
MONO%: 8.8 % (ref 0.0–14.0)
NEUT#: 3.5 10*3/uL (ref 1.5–6.5)
NEUT%: 52.5 % (ref 38.4–76.8)
Platelets: 127 10*3/uL — ABNORMAL LOW (ref 145–400)
RBC: 3.99 10*6/uL (ref 3.70–5.45)
RDW: 12.5 % (ref 11.2–14.5)
WBC: 6.6 10*3/uL (ref 3.9–10.3)
lymph#: 2.4 10*3/uL (ref 0.9–3.3)

## 2016-07-19 NOTE — Progress Notes (Signed)
Patient came in to bring some expenses she would like paid from her grant. I was able to submit one of the three. The other two needed a W-9 for the rent and the complete statement for the car payment. Patient states she will bring them in at a later date.

## 2016-07-19 NOTE — Progress Notes (Signed)
Candice [name]  [phone] [phone]     ID: Candice [name] DOB: 1976/01/11  MR#: [id number]  HWT#:[id number]  Patient Care Team: Jonathon Jordan, MD as PCP - General (Family Medicine) Alphonsa Overall, MD as Consulting Physician (General Surgery) Chauncey Cruel, MD as Consulting Physician (Oncology) Eppie Gibson, MD as Attending Physician (Radiation Oncology) Osborne Oman, MD as Attending Physician (Obstetrics and Gynecology) Scot Dock, NP OTHER MD:  CHIEF COMPLAINT: estrogen receptor positive breast cancer  CURRENT TREATMENT: Adjuvant chemotherapy   BREAST CANCER HISTORY: From the original intake note:  [name] [facility] [place] in December, at which time a mass in the right breast was noted. She was referred for BCCCP and screening mammography was obtained, showing a possible mass in the right breast.on 04/21/2016 she underwent right diagnostic mammography with tomography and ultrasonography at the breast Center, and this confirmedan irregular spiculat in the right breast measuring 1.8 cm, which was not palpable to the mammographer. Ultrasonography confirmed an irregular hypoechoic right breast mass at the 12:30 o'clock position 4 cm from the nipple measuring 2.2 cm. The right axilla was sonographically benign.  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  Her subsequent history is detailed below  INTERVAL HISTORY: Candice [name] is here today after receiving her first cycle of Doxorubicin and cyclophosphamide.  She has had some nausea resolved and managed with anti-emetics along with fatigue.  She has continued to be active with walking, and is  drinking water.  She is eating small meals, and she has noted an improvement in chronic constipation.  She had mild pain after neulasta that has since resolved.  REVIEW OF SYSTEMS:  A detailed ROS was conducted and is non contributory.   PAST MEDICAL HISTORY: Past Medical History:  Diagnosis Date  . Breast cancer ([place]) 05/2016   right  . Chronic constipation   . Family history of breast cancer   . Family history of ovarian cancer   . Family history of pancreatic cancer   . Family history of prostate cancer   . IBS (irritable bowel syndrome)     PAST SURGICAL HISTORY: Past Surgical History:  Procedure Laterality Date  . BARTHOLIN CYST MARSUPIALIZATION  09/04/2002  . BARTHOLIN GLAND CYST EXCISION Left 07/13/2005  . BREAST LUMPECTOMY WITH RADIOACTIVE SEED AND SENTINEL LYMPH NODE BIOPSY Right 06/07/2016   Procedure: BREAST LUMPECTOMY WITH RADIOACTIVE SEED AND SENTINEL LYMPH NODE BIOPSY;  Surgeon: Alphonsa Overall, MD;  Location: [facility];  Service: General;  Laterality: Right;  . HYSTEROSCOPY  2016  . MYOMECTOMY  07/20/2004  . OVARIAN CYST REMOVAL Right 07/20/2004  . PORTACATH PLACEMENT N/A 07/05/2016   Procedure: INSERTION PORT-A-CATH WITH [place];  Surgeon: Alphonsa Overall, MD;  Location: [facility];  Service: General;  Laterality: N/A;    FAMILY HISTORY Family History  Problem Relation Age of Onset  . Breast cancer Mother 68  . Pancreatic cancer Paternal Grandmother   . Prostate cancer Paternal Grandfather   . Ovarian cancer Other     MGMs sister  . Breast cancer Other     mother's maternal first cousin  The patient's father, Candice [name], lives in [place] and works as a Geneticist, molecular. The patient's mother is  a CNA. The patient has no brother, one sister, who works in [place] as a Network engineer. The patient's mother had breast cancer, stage 0, diagnosed at age 69. There are 2 maternal relatives (mother's sister and mother's niece) with breast cancer,  both postmenopausal.  GYNECOLOGIC HISTORY:  Menarche age 41, the patient is GX P0. She has regular periods, most recently mid-January. She is on oral contraceptives at present. No LMP recorded.   SOCIAL HISTORY:  The patient has a PhD degree and has worked for [employer] here and in [place] but is currently unemployed.  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 She generally lives in [place] by herself although she is currently staying at her parents.    ADVANCED DIRECTIVES: Not in place. The patient tells me she intends to name her sister as her healthcare power of attorney.   HEALTH MAINTENANCE: Social History  Substance Use Topics  . Smoking status: Never Smoker  . Smokeless tobacco: Never Used  . Alcohol use 0.6 oz/week    1 Glasses of wine per week     Comment: socially     Colonoscopy: July 2016, in [place]  PAP:  Bone density: Never   Allergies  Allergen Reactions  . Amoxicillin Rash    Current Outpatient Prescriptions  Medication Sig Dispense Refill  . Coenzyme Q10 (COQ10 PO) Take by mouth. LIQUID    . dexamethasone (DECADRON) 4 MG tablet Take 2 tablets by mouth once a day on the day after chemotherapy and then take 2 tablets two times a day for 2 days. Take with food. 30 tablet 1  . lidocaine-prilocaine (EMLA) cream Apply 1 application topically as needed. Apply over power port site about 30 to 60 minutes before chemotherapy. 30 g 0  . loratadine (CLARITIN) 10 MG tablet Take 1 tablet (10 mg total) by mouth daily. 30 tablet 1  . LORazepam (ATIVAN) 0.5 MG tablet Take 1 tablet (0.5 mg total) by mouth at bedtime as needed (Nausea or vomiting). 30 tablet 0  . naproxen sodium (ANAPROX) 220 MG tablet Take 220 mg by mouth 2 (two) times daily with a meal.    . OVER THE COUNTER MEDICATION Take 7 tablets by mouth 2 (two) times daily. SUPER COLON CLEANSE    . prochlorperazine (COMPAZINE) 10 MG tablet Take 1 tablet (10 mg total) by mouth every 6  (six) hours as needed (Nausea or vomiting). 30 tablet 1  . tamoxifen (NOLVADEX) 20 MG tablet Take 1 tablet (20 mg total) by mouth daily. (Patient not taking: Reported on 07/11/2016) 30 tablet 4   No current facility-administered medications for this visit.     OBJECTIVE:   Vitals:   07/19/16 1502  BP: 116/71  Pulse: 81  Resp: 20  Temp: 98.1 F (36.7 C)     Body mass index is 23.4 kg/m.    ECOG FS:0 - Asymptomatic  GENERAL: Patient is a well appearing female in no acute distress HEENT:  Sclerae anicteric.  PERRL, Oropharynx clear and moist. No ulcerations or evidence of oropharyngeal candidiasis. Neck is supple.  NODES:  No cervical, supraclavicular, or axillary lymphadenopathy palpated.  BREAST EXAM:  Deferred. LUNGS:  Clear to auscultation bilaterally.  No wheezes or rhonchi. HEART:  Regular rate and rhythm. No murmur appreciated. ABDOMEN:  Soft, nontender.  Positive, normoactive bowel sounds. No organomegaly palpated. MSK:  No focal spinal tenderness to palpation. Full range of motion bilaterally in the upper extremities. EXTREMITIES:  No peripheral edema.  SKIN:  Clear with no obvious rashes or skin changes. No nail dyscrasia. NEURO:  Nonfocal. Well oriented.  Appropriate affect.     LAB RESULTS:  CMP     Component Value Date/Time   NA 140 07/19/2016 1414   K 3.8 07/19/2016 1414   CO2 26 07/19/2016 1414   GLUCOSE 106 07/19/2016 1414   BUN 11.3 07/19/2016 1414   CREATININE 0.9 07/19/2016 1414   CALCIUM 8.6 07/19/2016 1414   PROT 6.3 (L) 07/19/2016 1414   ALBUMIN 3.5 07/19/2016 1414   AST 15 07/19/2016 1414   ALT 10 07/19/2016 1414   ALKPHOS 82 07/19/2016 1414   BILITOT <0.22 07/19/2016 1414    INo results found for: SPEP, UPEP  Lab Results  Component Value Date   WBC 6.6 07/19/2016   NEUTROABS 3.5 07/19/2016   HGB 12.6 07/19/2016   HCT 36.0 07/19/2016   MCV 90.2 07/19/2016   PLT 127 (L) 07/19/2016      Chemistry      Component Value Date/Time   NA  140 07/19/2016 1414   K 3.8 07/19/2016 1414   CO2 26 07/19/2016 1414   BUN 11.3 07/19/2016 1414   CREATININE 0.9 07/19/2016 1414      Component Value Date/Time   CALCIUM 8.6 07/19/2016 1414   ALKPHOS 82 07/19/2016 1414   AST 15 07/19/2016 1414   ALT 10 07/19/2016 1414   BILITOT <0.22 07/19/2016 1414       No results found for: LABCA2  No components found for: LABCA125  No results for input(s): INR in the last 168 hours.  Urinalysis No results found for: COLORURINE, APPEARANCEUR, LABSPEC, PHURINE, GLUCOSEU, HGBUR, BILIRUBINUR, KETONESUR, PROTEINUR, UROBILINOGEN, NITRITE, LEUKOCYTESUR   STUDIES: Dg Chest Port 1 View  Result Date: 07/05/2016 CLINICAL DATA:  Port-A-Cath in place, history of breast carcinoma EXAM: PORTABLE CHEST 1 VIEW COMPARISON:  None. FINDINGS: A left-sided Port-A-Cath is present with the tip seen to the mid SVC. No pneumothorax is seen. No pleural effusion is noted. The heart is within normal limits in size. No bony abnormality is seen. IMPRESSION: 1. Left-sided Port-A-Cath tip overlies the mid SVC. No pneumothorax. 2. No active lung disease. Electronically Signed   By: Ivar Drape M.D.   On: 07/05/2016 15:59   Dg Fluoro Guide Cv Line-no Report  Result Date: 07/05/2016 Fluoroscopy was utilized by the requesting physician.  No radiographic interpretation.    ELIGIBLE FOR AVAILABLE RESEARCH PROTOCOL: *no  ASSESSMENT: 41 y.o. [place] woman currently residing in [place], status post right breast upper inner quadrant biopsy 04/21/2016 for a clinical T2 N0, stage IIa invasive ductal carcinoma, grade 2, estrogen and progesterone receptor positive, HER-2 nonamplified, with an MIB-1 of 20%  (a) biopsy of 2 additional suspicious areas in the right breast 05/12/2016 was benign  (1) started tamoxifen 04/27/2016, Discontinued at the start of chemotherapy  (2) Oncotype DX obtained from the original biopsy showed a score of 14, predicting a 10 year risk  of recurrence outside the breast of 9% if the patient's only systemic therapy is tamoxifen for 5 years. It also predicts no benefit from chemotherapy.   (3) patient is not interested in fertility preservation  (4) genetics testing 04/27/2016 through the [facility] gene panel offered by [facility] found no deleterious mutations in APC, ATM, BARD1, BMPR1A, BRCA1, BRCA2, BRIP1, CHD1, CDK4, CDKN2A, CHEK2, EPCAM (large rearrangement only), MLH1, MSH2, MSH6, MUTYH, NBN, PALB2, PMS2, PTEN, RAD51C, RAD51D, SMAD4, STK11, and TP53. Sequencing was performed for select regions  of POLE and POLD1, and large rearrangement analysis was performed for select regions of GREM1.   (5) status post right lumpectomy with sentinel lymph node sampling 06/07/2016 for apT1c pN1, stage IB invasive ductal carcinoma, grade 2, with negative margins   (6) Mammaprint sent from the final surgical sample was read as high risk, indicating a need for chemotherapy   (7) doxorubicin and cyclophosphamide in dose dense fashion 4 to started 07/12/2016 followed by paclitaxel weekly 12  (8) adjuvant radiation  to follow   (9) to resume tamoxifen at the completion of local treatment  PLAN: Candice [name] is doing well following her first cycle of Doxorubicin and Cyclophosphamide.  Her cbc is normal and I reviewed this with her in detail.  At her request, we went step by step through her pathology report and I printed her another copy of her report out.  She verbalized understanding.  She will return in one week for labs, evaluation and cycle 2 of Doxorubicin and Cyclophosphamide.   She has a good understanding of this plan. She agrees with it. She knows to call for any problems that may develop before her next visit here.  A total of (30) minutes of face-to-face time was spent with this patient with greater than 50% of that time in counseling and care-coordination.  Scot Dock, NP   07/20/2016 3:09 PM Medical Oncology and  Hematology [facility] [address] [place],  [address] Tel. [phone]    Fax. [phone]

## 2016-07-20 ENCOUNTER — Encounter: Payer: Self-pay | Admitting: Adult Health

## 2016-07-20 NOTE — Addendum Note (Signed)
Encounter addended by: Jolaine Artist, MD on: 07/20/2016 11:54 PM<BR>    Actions taken: Sign clinical note

## 2016-07-25 ENCOUNTER — Encounter: Payer: Self-pay | Admitting: *Deleted

## 2016-07-25 NOTE — Progress Notes (Signed)
[facility] Clinical Social Work  Patient returned completed Cancer Care application with requested documents.  CSW and patient reviewed the application and CSW completed Healthcare sections.   Application was submitted to Cancer Care.  Cancer Care will contact patient once reviewed.  Patients knows to contact CSW with questions or concerns.    Johnnye Lana, MSW, LCSW, OSW-C Clinical Social Worker [facility] [phone]

## 2016-07-26 ENCOUNTER — Encounter: Payer: Self-pay | Admitting: Adult Health

## 2016-07-26 ENCOUNTER — Ambulatory Visit (HOSPITAL_BASED_OUTPATIENT_CLINIC_OR_DEPARTMENT_OTHER): Payer: Medicaid Other | Admitting: Adult Health

## 2016-07-26 ENCOUNTER — Encounter: Payer: Self-pay | Admitting: *Deleted

## 2016-07-26 ENCOUNTER — Other Ambulatory Visit (HOSPITAL_BASED_OUTPATIENT_CLINIC_OR_DEPARTMENT_OTHER): Payer: Medicaid Other

## 2016-07-26 ENCOUNTER — Ambulatory Visit: Payer: Medicaid Other

## 2016-07-26 ENCOUNTER — Other Ambulatory Visit: Payer: Self-pay | Admitting: Oncology

## 2016-07-26 ENCOUNTER — Ambulatory Visit (HOSPITAL_BASED_OUTPATIENT_CLINIC_OR_DEPARTMENT_OTHER): Payer: Medicaid Other

## 2016-07-26 VITALS — BP 104/61 | HR 72 | Temp 98.1°F | Resp 18 | Wt 137.8 lb

## 2016-07-26 DIAGNOSIS — Z170 Estrogen receptor positive status [ER+]: Secondary | ICD-10-CM

## 2016-07-26 DIAGNOSIS — C50211 Malignant neoplasm of upper-inner quadrant of right female breast: Secondary | ICD-10-CM

## 2016-07-26 DIAGNOSIS — Z5111 Encounter for antineoplastic chemotherapy: Secondary | ICD-10-CM

## 2016-07-26 DIAGNOSIS — Z5189 Encounter for other specified aftercare: Secondary | ICD-10-CM

## 2016-07-26 LAB — COMPREHENSIVE METABOLIC PANEL
ALT: 11 U/L (ref 0–55)
AST: 15 U/L (ref 5–34)
Albumin: 3.3 g/dL — ABNORMAL LOW (ref 3.5–5.0)
Alkaline Phosphatase: 61 U/L (ref 40–150)
Anion Gap: 7 mEq/L (ref 3–11)
BUN: 7 mg/dL (ref 7.0–26.0)
CO2: 24 mEq/L (ref 22–29)
Calcium: 8.7 mg/dL (ref 8.4–10.4)
Chloride: 111 mEq/L — ABNORMAL HIGH (ref 98–109)
Creatinine: 0.8 mg/dL (ref 0.6–1.1)
EGFR: >90 mL/min/{1.73_m2} (ref >90)
Glucose: 92 mg/dl (ref 70–140)
Potassium: 3.7 mEq/L (ref 3.5–5.1)
Sodium: 142 mEq/L (ref 136–145)
Total Bilirubin: 0.23 mg/dL (ref 0.20–1.20)
Total Protein: 5.9 g/dL — ABNORMAL LOW (ref 6.4–8.3)

## 2016-07-26 LAB — CBC WITH DIFFERENTIAL/PLATELET
BASO%: 0.3 % (ref 0.0–2.0)
Basophils Absolute: 0 10*3/uL (ref 0.0–0.1)
EOS%: 1.4 % (ref 0.0–7.0)
Eosinophils Absolute: 0.1 10*3/uL (ref 0.0–0.5)
HCT: 34.2 % — ABNORMAL LOW (ref 34.8–46.6)
HGB: 11.9 g/dL (ref 11.6–15.9)
LYMPH%: 39.7 % (ref 14.0–49.7)
MCH: 31.6 pg (ref 25.1–34.0)
MCHC: 34.8 g/dL (ref 31.5–36.0)
MCV: 90.7 fL (ref 79.5–101.0)
MONO#: 0.3 10*3/uL (ref 0.1–0.9)
MONO%: 7.3 % (ref 0.0–14.0)
NEUT#: 1.8 10*3/uL (ref 1.5–6.5)
NEUT%: 51.3 % (ref 38.4–76.8)
Platelets: 172 10*3/uL (ref 145–400)
RBC: 3.77 10*6/uL (ref 3.70–5.45)
RDW: 13.2 % (ref 11.2–14.5)
WBC: 3.6 10*3/uL — ABNORMAL LOW (ref 3.9–10.3)
lymph#: 1.4 10*3/uL (ref 0.9–3.3)

## 2016-07-26 MED ORDER — PALONOSETRON HCL INJECTION 0.25 MG/5ML
INTRAVENOUS | Status: AC
  Filled 2016-07-26: qty 5

## 2016-07-26 MED ORDER — PEGFILGRASTIM 6 MG/0.6ML ~~LOC~~ PSKT
6.0000 mg | PREFILLED_SYRINGE | Freq: Once | SUBCUTANEOUS | Status: AC
  Administered 2016-07-26: 6 mg via SUBCUTANEOUS
  Filled 2016-07-26: qty 0.6

## 2016-07-26 MED ORDER — DOXORUBICIN HCL CHEMO IV INJECTION 2 MG/ML
60.0000 mg/m2 | Freq: Once | INTRAVENOUS | Status: AC
  Administered 2016-07-26: 102 mg via INTRAVENOUS
  Filled 2016-07-26: qty 51

## 2016-07-26 MED ORDER — SODIUM CHLORIDE 0.9% FLUSH
10.0000 mL | INTRAVENOUS | Status: DC | PRN
  Administered 2016-07-26: 10 mL
  Filled 2016-07-26: qty 10

## 2016-07-26 MED ORDER — PALONOSETRON HCL INJECTION 0.25 MG/5ML
0.2500 mg | Freq: Once | INTRAVENOUS | Status: AC
  Administered 2016-07-26: 0.25 mg via INTRAVENOUS

## 2016-07-26 MED ORDER — CYCLOPHOSPHAMIDE CHEMO INJECTION 1 GM
590.0000 mg/m2 | Freq: Once | INTRAMUSCULAR | Status: AC
  Administered 2016-07-26: 1000 mg via INTRAVENOUS
  Filled 2016-07-26: qty 50

## 2016-07-26 MED ORDER — SODIUM CHLORIDE 0.9 % IV SOLN
Freq: Once | INTRAVENOUS | Status: AC
  Administered 2016-07-26: 10:00:00 via INTRAVENOUS

## 2016-07-26 MED ORDER — SODIUM CHLORIDE 0.9 % IV SOLN
Freq: Once | INTRAVENOUS | Status: AC
  Administered 2016-07-26: 10:00:00 via INTRAVENOUS
  Filled 2016-07-26: qty 5

## 2016-07-26 MED ORDER — HEPARIN SOD (PORK) LOCK FLUSH 100 UNIT/ML IV SOLN
500.0000 [IU] | Freq: Once | INTRAVENOUS | Status: AC | PRN
  Administered 2016-07-26: 500 [IU]
  Filled 2016-07-26: qty 5

## 2016-07-26 NOTE — Patient Instructions (Signed)
[facility] Discharge Instructions for Patients Receiving Chemotherapy  Today you received the following chemotherapy agents:Adriamycin and Cytoxan   To help prevent nausea and vomiting after your treatment, we encourage you to take your nausea medication as directed.    If you develop nausea and vomiting that is not controlled by your nausea medication, call the clinic.   BELOW ARE SYMPTOMS THAT SHOULD BE REPORTED IMMEDIATELY:  *FEVER GREATER THAN 100.5 F  *CHILLS WITH OR WITHOUT FEVER  NAUSEA AND VOMITING THAT IS NOT CONTROLLED WITH YOUR NAUSEA MEDICATION  *UNUSUAL SHORTNESS OF BREATH  *UNUSUAL BRUISING OR BLEEDING  TENDERNESS IN MOUTH AND THROAT WITH OR WITHOUT PRESENCE OF ULCERS  *URINARY PROBLEMS  *BOWEL PROBLEMS  UNUSUAL RASH Items with * indicate a potential emergency and should be followed up as soon as possible.  Feel free to call the clinic you have any questions or concerns. The clinic phone number is [phone].  Please show the CHEMO ALERT CARD at check-in to the Emergency Department and triage nurse.   

## 2016-07-26 NOTE — Patient Instructions (Signed)
Implanted Port Home Guide An implanted port is a type of central line that is placed under the skin. Central lines are used to provide IV access when treatment or nutrition needs to be given through a person's veins. Implanted ports are used for long-term IV access. An implanted port may be placed because:  You need IV medicine that would be irritating to the small veins in your hands or arms.  You need long-term IV medicines, such as antibiotics.  You need IV nutrition for a long period.  You need frequent blood draws for lab tests.  You need dialysis.  Implanted ports are usually placed in the chest area, but they can also be placed in the upper arm, the abdomen, or the leg. An implanted port has two main parts:  Reservoir. The reservoir is round and will appear as a small, raised area under your skin. The reservoir is the part where a needle is inserted to give medicines or draw blood.  Catheter. The catheter is a thin, flexible tube that extends from the reservoir. The catheter is placed into a large vein. Medicine that is inserted into the reservoir goes into the catheter and then into the vein.  How will I care for my incision site? Do not get the incision site wet. Bathe or shower as directed by your health care provider. How is my port accessed? Special steps must be taken to access the port:  Before the port is accessed, a numbing cream can be placed on the skin. This helps numb the skin over the port site.  Your health care provider uses a sterile technique to access the port. ? Your health care provider must put on a mask and sterile gloves. ? The skin over your port is cleaned carefully with an antiseptic and allowed to dry. ? The port is gently pinched between sterile gloves, and a needle is inserted into the port.  Only "non-coring" port needles should be used to access the port. Once the port is accessed, a blood return should be checked. This helps ensure that the port  is in the vein and is not clogged.  If your port needs to remain accessed for a constant infusion, a clear (transparent) bandage will be placed over the needle site. The bandage and needle will need to be changed every week, or as directed by your health care provider.  Keep the bandage covering the needle clean and dry. Do not get it wet. Follow your health care provider's instructions on how to take a shower or bath while the port is accessed.  If your port does not need to stay accessed, no bandage is needed over the port.  What is flushing? Flushing helps keep the port from getting clogged. Follow your health care provider's instructions on how and when to flush the port. Ports are usually flushed with saline solution or a medicine called heparin. The need for flushing will depend on how the port is used.  If the port is used for intermittent medicines or blood draws, the port will need to be flushed: ? After medicines have been given. ? After blood has been drawn. ? As part of routine maintenance.  If a constant infusion is running, the port may not need to be flushed.  How long will my port stay implanted? The port can stay in for as long as your health care provider thinks it is needed. When it is time for the port to come out, surgery will be   done to remove it. The procedure is similar to the one performed when the port was put in. When should I seek immediate medical care? When you have an implanted port, you should seek immediate medical care if:  You notice a bad smell coming from the incision site.  You have swelling, redness, or drainage at the incision site.  You have more swelling or pain at the port site or the surrounding area.  You have a fever that is not controlled with medicine.  This information is not intended to replace advice given to you by your health care provider. Make sure you discuss any questions you have with your health care provider. Document  Released: 03/14/2005 Document Revised: 08/20/2015 Document Reviewed: 11/19/2012 Elsevier Interactive Patient Education  2017 [employer]  

## 2016-07-26 NOTE — Progress Notes (Signed)
[place]  [phone] [phone]     ID: Candice [name] DOB: 1975/06/17  MR#: [id number]  LKG#:[id number]  Patient Care Team: Jonathon Jordan, MD as PCP - General (Family Medicine) Alphonsa Overall, MD as Consulting Physician (General Surgery) Chauncey Cruel, MD as Consulting Physician (Oncology) Eppie Gibson, MD as Attending Physician (Radiation Oncology) Osborne Oman, MD as Attending Physician (Obstetrics and Gynecology) Scot Dock, NP OTHER MD:  CHIEF COMPLAINT: estrogen receptor positive breast cancer  CURRENT TREATMENT: Adjuvant chemotherapy   BREAST CANCER HISTORY: From the original intake note:  Candice [name] was evaluated at the [facility] in [place] in December, at which time a mass in the right breast was noted. She was referred for BCCCP and screening mammography was obtained, showing a possible mass in the right breast.on 04/21/2016 she underwent right diagnostic mammography with tomography and ultrasonography at the breast Center, and this confirmedan irregular spiculat in the right breast measuring 1.8 cm, which was not palpable to the mammographer. Ultrasonography confirmed an irregular hypoechoic right breast mass at the 12:30 o'clock position 4 cm from the nipple measuring 2.2 cm. The right axilla was sonographically benign.  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  Her subsequent history is detailed below  INTERVAL HISTORY: Candice [name] is here today prior to receiving cycle 2 of Doxorubicin and Cylcophosphamide.  She has intermittent nausea, but manages this with her anti-emetics.  She denies any fevers, chills, nausea and vomiting.  She has chronic  constipation, which has improved with eating small frequent meals.  She continues to take her OTC super colon cleanse.  She has some small hyperpigmented "bumps" on her arms and back that are not bothering her, but just appeared.  They do not itch and she hasn't noticed them being red.    REVIEW OF SYSTEMS:  A detailed ROS was conducted and is non contributory unless noted above.   PAST MEDICAL HISTORY: Past Medical History:  Diagnosis Date  . Breast cancer ([place]) 05/2016   right  . Chronic constipation   . Family history of breast cancer   . Family history of ovarian cancer   . Family history of pancreatic cancer   . Family history of prostate cancer   . IBS (irritable bowel syndrome)     PAST SURGICAL HISTORY: Past Surgical History:  Procedure Laterality Date  . BARTHOLIN CYST MARSUPIALIZATION  09/04/2002  . BARTHOLIN GLAND CYST EXCISION Left 07/13/2005  . BREAST LUMPECTOMY WITH RADIOACTIVE SEED AND SENTINEL LYMPH NODE BIOPSY Right 06/07/2016   Procedure: BREAST LUMPECTOMY WITH RADIOACTIVE SEED AND SENTINEL LYMPH NODE BIOPSY;  Surgeon: Alphonsa Overall, MD;  Location: [facility];  Service: General;  Laterality: Right;  . HYSTEROSCOPY  2016  . MYOMECTOMY  07/20/2004  . OVARIAN CYST REMOVAL Right 07/20/2004  . PORTACATH PLACEMENT N/A 07/05/2016   Procedure: INSERTION PORT-A-CATH WITH [place];  Surgeon: Alphonsa Overall, MD;  Location: [facility];  Service: General;  Laterality: N/A;    FAMILY HISTORY Family History  Problem Relation Age of Onset  . Breast cancer Mother 51  . Pancreatic cancer Paternal Grandmother   . Prostate cancer Paternal Grandfather   . Ovarian cancer Other     MGMs sister  . Breast cancer  Other     mother's maternal first cousin  The patient's father, Candice [name], lives in [place] and works as a Geneticist, molecular. The patient's mother is a Quarry manager. The patient has no brother, one sister, who works in [place] as a Network engineer. The  patient's mother had breast cancer, stage 0, diagnosed at age 46. There are 2 maternal relatives (mother's sister and mother's niece) with breast cancer, both postmenopausal.  GYNECOLOGIC HISTORY:  Menarche age 37, the patient is GX P0. She has regular periods, most recently mid-January. She is on oral contraceptives at present. No LMP recorded.   SOCIAL HISTORY:  The patient has a PhD degree and has worked for [employer] here and in [place] but is currently unemployed.  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 She generally lives in [place] by herself although she is currently staying at her parents.    ADVANCED DIRECTIVES: Not in place. The patient tells me she intends to name her sister as her healthcare power of attorney.   HEALTH MAINTENANCE: Social History  Substance Use Topics  . Smoking status: Never Smoker  . Smokeless tobacco: Never Used  . Alcohol use 0.6 oz/week    1 Glasses of wine per week     Comment: socially     Colonoscopy: July 2016, in [place]  PAP:  Bone density: Never   Allergies  Allergen Reactions  . Amoxicillin Rash    Current Outpatient Prescriptions  Medication Sig Dispense Refill  . Coenzyme Q10 (COQ10 PO) Take by mouth. LIQUID    . dexamethasone (DECADRON) 4 MG tablet Take 2 tablets by mouth once a day on the day after chemotherapy and then take 2 tablets two times a day for 2 days. Take with food. 30 tablet 1  . lidocaine-prilocaine (EMLA) cream Apply 1 application topically as needed. Apply over power port site about 30 to 60 minutes before chemotherapy. 30 g 0  . loratadine (CLARITIN) 10 MG tablet Take 1 tablet (10 mg total) by mouth daily. 30 tablet 1  . LORazepam (ATIVAN) 0.5 MG tablet Take 1 tablet (0.5 mg total) by mouth at bedtime as needed (Nausea or vomiting). 30 tablet 0  . OVER THE COUNTER MEDICATION Take 7 tablets by mouth 2 (two) times daily. SUPER COLON CLEANSE    . prochlorperazine (COMPAZINE) 10 MG  tablet Take 1 tablet (10 mg total) by mouth every 6 (six) hours as needed (Nausea or vomiting). 30 tablet 1  . tamoxifen (NOLVADEX) 20 MG tablet Take 1 tablet (20 mg total) by mouth daily. (Patient not taking: Reported on 07/11/2016) 30 tablet 4   No current facility-administered medications for this visit.     OBJECTIVE:   Vitals:   07/26/16 0847  BP: 104/61  Pulse: 72  Resp: 18  Temp: 98.1 F (36.7 C)     Body mass index is 23.65 kg/m.    ECOG FS:1  GENERAL: Patient is a well appearing female in no acute distress HEENT:  Sclerae anicteric.  PERRL, Oropharynx clear and moist. No ulcerations or evidence of oropharyngeal candidiasis. Neck is supple.  NODES:  No cervical, supraclavicular, or axillary lymphadenopathy palpated.  BREAST EXAM:  Deferred. LUNGS:  Clear to auscultation bilaterally.  No wheezes or rhonchi. HEART:  Regular rate and rhythm. No murmur appreciated. ABDOMEN:  Soft, nontender.  Positive, normoactive bowel sounds. No organomegaly palpated. MSK:  No focal spinal tenderness to palpation. Full range of motion bilaterally in the upper extremities. EXTREMITIES:  No  peripheral edema.   SKIN:  Clear with no obvious rashes or skin changes. Small pinpoint hyperpigmented ? Sebaceous cysts, non erythematous, no drainage.   NEURO:  Nonfocal. Well oriented.  Appropriate affect.     LAB RESULTS:  CMP     Component Value Date/Time   NA 140 07/19/2016 1414   K 3.8 07/19/2016 1414   CO2 26 07/19/2016 1414   GLUCOSE 106 07/19/2016 1414   BUN 11.3 07/19/2016 1414   CREATININE 0.9 07/19/2016 1414   CALCIUM 8.6 07/19/2016 1414   PROT 6.3 (L) 07/19/2016 1414   ALBUMIN 3.5 07/19/2016 1414   AST 15 07/19/2016 1414   ALT 10 07/19/2016 1414   ALKPHOS 82 07/19/2016 1414   BILITOT <0.22 07/19/2016 1414    INo results found for: SPEP, UPEP  Lab Results  Component Value Date   WBC 3.6 (L) 07/26/2016   NEUTROABS 1.8 07/26/2016   HGB 11.9 07/26/2016   HCT 34.2 (L)  07/26/2016   MCV 90.7 07/26/2016   PLT 172 07/26/2016      Chemistry      Component Value Date/Time   NA 140 07/19/2016 1414   K 3.8 07/19/2016 1414   CO2 26 07/19/2016 1414   BUN 11.3 07/19/2016 1414   CREATININE 0.9 07/19/2016 1414      Component Value Date/Time   CALCIUM 8.6 07/19/2016 1414   ALKPHOS 82 07/19/2016 1414   AST 15 07/19/2016 1414   ALT 10 07/19/2016 1414   BILITOT <0.22 07/19/2016 1414       No results found for: LABCA2  No components found for: LABCA125  No results for input(s): INR in the last 168 hours.  Urinalysis No results found for: COLORURINE, APPEARANCEUR, LABSPEC, PHURINE, GLUCOSEU, HGBUR, BILIRUBINUR, KETONESUR, PROTEINUR, UROBILINOGEN, NITRITE, LEUKOCYTESUR   STUDIES: Dg Chest Port 1 View  Result Date: 07/05/2016 CLINICAL DATA:  Port-A-Cath in place, history of breast carcinoma EXAM: PORTABLE CHEST 1 VIEW COMPARISON:  None. FINDINGS: A left-sided Port-A-Cath is present with the tip seen to the mid SVC. No pneumothorax is seen. No pleural effusion is noted. The heart is within normal limits in size. No bony abnormality is seen. IMPRESSION: 1. Left-sided Port-A-Cath tip overlies the mid SVC. No pneumothorax. 2. No active lung disease. Electronically Signed   By: Ivar Drape M.D.   On: 07/05/2016 15:59   Dg Fluoro Guide Cv Line-no Report  Result Date: 07/05/2016 Fluoroscopy was utilized by the requesting physician.  No radiographic interpretation.    ELIGIBLE FOR AVAILABLE RESEARCH PROTOCOL: *no  ASSESSMENT: 41 y.o. [place] woman currently residing in [place], status post right breast upper inner quadrant biopsy 04/21/2016 for a clinical T2 N0, stage IIa invasive ductal carcinoma, grade 2, estrogen and progesterone receptor positive, HER-2 nonamplified, with an MIB-1 of 20%  (a) biopsy of 2 additional suspicious areas in the right breast 05/12/2016 was benign  (1) started tamoxifen 04/27/2016, Discontinued at the start of  chemotherapy  (2) Oncotype DX obtained from the original biopsy showed a score of 14, predicting a 10 year risk of recurrence outside the breast of 9% if the patient's only systemic therapy is tamoxifen for 5 years. It also predicts no benefit from chemotherapy.   (3) patient is not interested in fertility preservation  (4) genetics testing 04/27/2016 through the [facility] gene panel offered by [facility] found no deleterious mutations in APC, ATM, BARD1, BMPR1A, BRCA1, BRCA2, BRIP1, CHD1, CDK4, CDKN2A, CHEK2, EPCAM (large rearrangement only), MLH1, MSH2, MSH6, MUTYH, NBN, PALB2,  PMS2, PTEN, RAD51C, RAD51D, SMAD4, STK11, and TP53. Sequencing was performed for select regions of POLE and POLD1, and large rearrangement analysis was performed for select regions of GREM1.   (5) status post right lumpectomy with sentinel lymph node sampling 06/07/2016 for apT1c pN1, stage IB invasive ductal carcinoma, grade 2, with negative margins   (6) Mammaprint sent from the final surgical sample was read as high risk, indicating a need for chemotherapy   (7) doxorubicin and cyclophosphamide in dose dense fashion 4 to started 07/12/2016 followed by paclitaxel weekly 12  (8) adjuvant radiation  to follow   (9) to resume tamoxifen at the completion of local treatment  PLAN: Leena is doing well today.  I reviewed her CBC with her which was normal (CMET pending).  She will proceed with cycle 2 of Doxorubicin and Cyclophosphamide today.  We will monitor these skin lesions.  For now they appear to be benign and not bothering her.  I encouraged her to continue eating small frequent meals, and taking her nausea medication when needed.    Tempest will return in one week for labs, along with an appointment with Dr. Jana Hakim.  She has a good understanding of this plan. She agrees with it. She knows to call for any problems that may develop before her next visit here.  A total of (30) minutes of  face-to-face time was spent with this patient with greater than 50% of that time in counseling and care-coordination.  Scot Dock, NP   07/26/2016 8:54 AM Medical Oncology and Hematology [facility] [address] [place], [place] [address] Tel. [phone]    Fax. [phone]

## 2016-08-02 ENCOUNTER — Ambulatory Visit (HOSPITAL_BASED_OUTPATIENT_CLINIC_OR_DEPARTMENT_OTHER): Payer: Medicaid Other | Admitting: Oncology

## 2016-08-02 ENCOUNTER — Other Ambulatory Visit (HOSPITAL_BASED_OUTPATIENT_CLINIC_OR_DEPARTMENT_OTHER): Payer: Medicaid Other

## 2016-08-02 VITALS — BP 103/55 | HR 92 | Temp 98.1°F | Ht 64.0 in | Wt 135.8 lb

## 2016-08-02 DIAGNOSIS — C50211 Malignant neoplasm of upper-inner quadrant of right female breast: Secondary | ICD-10-CM | POA: Diagnosis not present

## 2016-08-02 DIAGNOSIS — Z170 Estrogen receptor positive status [ER+]: Secondary | ICD-10-CM

## 2016-08-02 LAB — COMPREHENSIVE METABOLIC PANEL
ALT: 16 U/L (ref 0–55)
AST: 19 U/L (ref 5–34)
Albumin: 3.4 g/dL — ABNORMAL LOW (ref 3.5–5.0)
Alkaline Phosphatase: 88 U/L (ref 40–150)
Anion Gap: 9 mEq/L (ref 3–11)
BUN: 9.4 mg/dL (ref 7.0–26.0)
CO2: 25 mEq/L (ref 22–29)
Calcium: 9 mg/dL (ref 8.4–10.4)
Chloride: 106 mEq/L (ref 98–109)
Creatinine: 0.8 mg/dL (ref 0.6–1.1)
EGFR: >90 mL/min/{1.73_m2} (ref >90)
Glucose: 97 mg/dl (ref 70–140)
Potassium: 4 mEq/L (ref 3.5–5.1)
Sodium: 140 mEq/L (ref 136–145)
Total Bilirubin: <0.22 mg/dL (ref 0.20–1.20)
Total Protein: 6 g/dL — ABNORMAL LOW (ref 6.4–8.3)

## 2016-08-02 LAB — CBC WITH DIFFERENTIAL/PLATELET
BASO%: 0.4 % (ref 0.0–2.0)
Basophils Absolute: 0 10*3/uL (ref 0.0–0.1)
EOS%: 0.7 % (ref 0.0–7.0)
Eosinophils Absolute: 0 10*3/uL (ref 0.0–0.5)
HCT: 35.6 % (ref 34.8–46.6)
HGB: 11.9 g/dL (ref 11.6–15.9)
LYMPH%: 37.9 % (ref 14.0–49.7)
MCH: 31.4 pg (ref 25.1–34.0)
MCHC: 33.5 g/dL (ref 31.5–36.0)
MCV: 93.7 fL (ref 79.5–101.0)
MONO#: 0.6 10*3/uL (ref 0.1–0.9)
MONO%: 16 % — ABNORMAL HIGH (ref 0.0–14.0)
NEUT#: 1.8 10*3/uL (ref 1.5–6.5)
NEUT%: 45 % (ref 38.4–76.8)
Platelets: 200 10*3/uL (ref 145–400)
RBC: 3.8 10*6/uL (ref 3.70–5.45)
RDW: 13.3 % (ref 11.2–14.5)
WBC: 4.1 10*3/uL (ref 3.9–10.3)
lymph#: 1.5 10*3/uL (ref 0.9–3.3)

## 2016-08-02 MED ORDER — DEXAMETHASONE 4 MG PO TABS: ORAL_TABLET | ORAL | 1 refills | Status: DC

## 2016-08-02 NOTE — Progress Notes (Signed)
[place]  Telephone:([phone] [phone]) [phone]     ID: MAVA SUARES DOB: 1975-09-11  MR#: [id number]  GUY#:[id number]  Patient Care Team: Jonathon Jordan, MD as PCP - General (Family Medicine) Alphonsa Overall, MD as Consulting Physician (General Surgery) Magrinat, Virgie Dad, MD as Consulting Physician (Oncology) Eppie Gibson, MD as Attending Physician (Radiation Oncology) Osborne Oman, MD as Attending Physician (Obstetrics and Gynecology) Chauncey Cruel, MD OTHER MD:  CHIEF COMPLAINT: estrogen receptor positive breast cancer  CURRENT TREATMENT: Adjuvant chemotherapy   BREAST CANCER HISTORY: From the original intake note:  Fiorela was evaluated at the [facility] in [place] in December, at which time a mass in the right breast was noted. She was referred for BCCCP and screening mammography was obtained, showing a possible mass in the right breast.on 04/21/2016 she underwent right diagnostic mammography with tomography and ultrasonography at the breast Center, and this confirmedan irregular spiculat in the right breast measuring 1.8 cm, which was not palpable to the mammographer. Ultrasonography confirmed an irregular hypoechoic right breast mass at the 12:30 o'clock position 4 cm from the nipple measuring 2.2 cm. The right axilla was sonographically benign.  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  Her subsequent history is detailed below  INTERVAL HISTORY: Lynore returns today for follow-up of her estrogen receptor positive breast cancer accompanied by her mother and sister. Today is day 8 cycle 2 of 4 planned cycles of cyclophosphamide and doxorubicin, given in dose dense  fashion, to be followed by weekly paclitaxel 12.  She did much better with cycle 2. She had slight nausea day 1 only. By day 3 she was not taking walks. Today she has already gone back to the gym and is doing some weights  REVIEW OF SYSTEMS: She did lose her hair and is now using a bandanna. She had a funny feeling in her mouth but no frank sores. Her sense of taste is still intact. Her port is working well. She continues constipated but most days manages to have a solid bowel movement in the morning. She has no problems with her urine and is drinking gallons of water she says. She had a menstrual period on 07/12/2016. It was normal for her. She is not having hot flashes or other symptoms or signs of menopause. A detailed review of systems today was otherwise stable  PAST MEDICAL HISTORY: Past Medical History:  Diagnosis Date  . Breast cancer ([place]) 05/2016   right  . Chronic constipation   . Family history of breast cancer   . Family history of ovarian cancer   . Family history of pancreatic cancer   . Family history of prostate cancer   . IBS (irritable bowel syndrome)     PAST SURGICAL HISTORY: Past Surgical History:  Procedure Laterality Date  . BARTHOLIN CYST MARSUPIALIZATION  09/04/2002  . BARTHOLIN GLAND CYST EXCISION Left 07/13/2005  . BREAST LUMPECTOMY WITH RADIOACTIVE SEED AND SENTINEL LYMPH NODE BIOPSY Right 06/07/2016   Procedure: BREAST LUMPECTOMY WITH RADIOACTIVE SEED AND SENTINEL LYMPH NODE BIOPSY;  Surgeon: Alphonsa Overall, MD;  Location: [place];  Service: General;  Laterality: Right;  . HYSTEROSCOPY  2016  . MYOMECTOMY  07/20/2004  . OVARIAN CYST REMOVAL Right 07/20/2004  .  PORTACATH PLACEMENT N/A 07/05/2016   Procedure: INSERTION PORT-A-CATH WITH [place];  Surgeon: Alphonsa Overall, MD;  Location: [facility];  Service: General;  Laterality: N/A;    FAMILY HISTORY Family History  Problem Relation Age of Onset  . Breast cancer Mother 62  .  Pancreatic cancer Paternal Grandmother   . Prostate cancer Paternal Grandfather   . Ovarian cancer Other     MGMs sister  . Breast cancer Other     mother's maternal first cousin  The patient's father, [name], lives in [place] and works as a Geneticist, molecular. The patient's mother is a Quarry manager. The patient has no brother, one sister, who works in [place] as a Network engineer. The patient's mother had breast cancer, stage 0, diagnosed at age 24. There are 2 maternal relatives (mother's sister and mother's niece) with breast cancer, both postmenopausal.  GYNECOLOGIC HISTORY:  Menarche age 41, the patient is GX P0. She has regular periods, most recently mid-January. She is on oral contraceptives at present. No LMP recorded.   SOCIAL HISTORY:  The patient has a PhD degree and has worked for [employer] here and in [place] but is currently unemployed.  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 She generally lives in [place] by herself although she is currently staying at her parents.    ADVANCED DIRECTIVES: Not in place. The patient tells me she intends to name her sister as her healthcare power of attorney.   HEALTH MAINTENANCE: Social History  Substance Use Topics  . Smoking status: Never Smoker  . Smokeless tobacco: Never Used  . Alcohol use 0.6 oz/week    1 Glasses of wine per week     Comment: socially     Colonoscopy: July 2016, in [place]  PAP:  Bone density: Never   Allergies  Allergen Reactions  . Amoxicillin Rash    Current Outpatient Prescriptions  Medication Sig Dispense Refill  . Coenzyme Q10 (COQ10 PO) Take by mouth. LIQUID    . dexamethasone (DECADRON) 4 MG tablet Take 2 tablets by mouth once a day on the day after chemotherapy and then take 2 tablets two times a day for 2 days. Take with food. 30 tablet 1  . lidocaine-prilocaine (EMLA) cream Apply 1 application topically as needed. Apply over power port site about 30 to 60 minutes  before chemotherapy. 30 g 0  . loratadine (CLARITIN) 10 MG tablet Take 1 tablet (10 mg total) by mouth daily. 30 tablet 1  . LORazepam (ATIVAN) 0.5 MG tablet Take 1 tablet (0.5 mg total) by mouth at bedtime as needed (Nausea or vomiting). 30 tablet 0  . OVER THE COUNTER MEDICATION Take 7 tablets by mouth 2 (two) times daily. SUPER COLON CLEANSE    . prochlorperazine (COMPAZINE) 10 MG tablet Take 1 tablet (10 mg total) by mouth every 6 (six) hours as needed (Nausea or vomiting). 30 tablet 1  . tamoxifen (NOLVADEX) 20 MG tablet Take 1 tablet (20 mg total) by mouth daily. (Patient not taking: Reported on 07/11/2016) 30 tablet 4   No current facility-administered medications for this visit.     OBJECTIVE: Young African-American woman in no acute distress  Vitals:   08/02/16 1217  BP: (!) 103/55  Pulse: 92  Temp: 98.1 F (36.7 C)     Body mass index is 23.31 kg/m.    ECOG FS:0  Sclerae unicteric, pupils round and equal Oropharynx clear and moist No cervical or supraclavicular adenopathy Lungs no rales  or rhonchi Heart regular rate and rhythm Abd soft, nontender, positive bowel sounds MSK no focal spinal tenderness, no upper extremity lymphedema Neuro: nonfocal, well oriented, appropriate affect Breasts: The right breast is status post lumpectomy. I do not palpate any suspicious finding. The cosmetic result is excellent. The left breast is unremarkable. Both axillae are benign.    LAB RESULTS:  CMP     Component Value Date/Time   NA 140 08/02/2016 1203   K 4.0 08/02/2016 1203   CO2 25 08/02/2016 1203   GLUCOSE 97 08/02/2016 1203   BUN 9.4 08/02/2016 1203   CREATININE 0.8 08/02/2016 1203   CALCIUM 9.0 08/02/2016 1203   PROT 6.0 (L) 08/02/2016 1203   ALBUMIN 3.4 (L) 08/02/2016 1203   AST 19 08/02/2016 1203   ALT 16 08/02/2016 1203   ALKPHOS 88 08/02/2016 1203   BILITOT <0.22 08/02/2016 1203    INo results found for: SPEP, UPEP  Lab Results  Component Value Date   WBC  4.1 08/02/2016   NEUTROABS 1.8 08/02/2016   HGB 11.9 08/02/2016   HCT 35.6 08/02/2016   MCV 93.7 08/02/2016   PLT 200 08/02/2016      Chemistry      Component Value Date/Time   NA 140 08/02/2016 1203   K 4.0 08/02/2016 1203   CO2 25 08/02/2016 1203   BUN 9.4 08/02/2016 1203   CREATININE 0.8 08/02/2016 1203      Component Value Date/Time   CALCIUM 9.0 08/02/2016 1203   ALKPHOS 88 08/02/2016 1203   AST 19 08/02/2016 1203   ALT 16 08/02/2016 1203   BILITOT <0.22 08/02/2016 1203       No results found for: LABCA2  No components found for: LABCA125  No results for input(s): INR in the last 168 hours.  Urinalysis No results found for: COLORURINE, APPEARANCEUR, LABSPEC, PHURINE, GLUCOSEU, HGBUR, BILIRUBINUR, KETONESUR, PROTEINUR, UROBILINOGEN, NITRITE, LEUKOCYTESUR   STUDIES: Dg Chest Port 1 View  Result Date: 07/05/2016 CLINICAL DATA:  Port-A-Cath in place, history of breast carcinoma EXAM: PORTABLE CHEST 1 VIEW COMPARISON:  None. FINDINGS: A left-sided Port-A-Cath is present with the tip seen to the mid SVC. No pneumothorax is seen. No pleural effusion is noted. The heart is within normal limits in size. No bony abnormality is seen. IMPRESSION: 1. Left-sided Port-A-Cath tip overlies the mid SVC. No pneumothorax. 2. No active lung disease. Electronically Signed   By: Ivar Drape M.D.   On: 07/05/2016 15:59   Dg Fluoro Guide Cv Line-no Report  Result Date: 07/05/2016 Fluoroscopy was utilized by the requesting physician.  No radiographic interpretation.    ELIGIBLE FOR AVAILABLE RESEARCH PROTOCOL: no  ASSESSMENT: 41 y.o. [place] woman currently residing in [place], status post right breast upper inner quadrant biopsy 04/21/2016 for a clinical T2 N0, stage IIa invasive ductal carcinoma, grade 2, estrogen and progesterone receptor positive, HER-2 nonamplified, with an MIB-1 of 20%  (a) biopsy of 2 additional suspicious areas in the right breast 05/12/2016 was  benign  (1) started tamoxifen 04/27/2016, Discontinued at the start of chemotherapy  (2) Oncotype DX obtained from the original biopsy showed a score of 14, predicting a 10 year risk of recurrence outside the breast of 9% if the patient's only systemic therapy is tamoxifen for 5 years. It also predicts no benefit from chemotherapy.   (3) patient is not interested in fertility preservation  (4) genetics testing 04/27/2016 through the [facility] gene panel offered by [facility] found no deleterious mutations in  APC, ATM, BARD1, BMPR1A, BRCA1, BRCA2, BRIP1, CHD1, CDK4, CDKN2A, CHEK2, EPCAM (large rearrangement only), MLH1, MSH2, MSH6, MUTYH, NBN, PALB2, PMS2, PTEN, RAD51C, RAD51D, SMAD4, STK11, and TP53. Sequencing was performed for select regions of POLE and POLD1, and large rearrangement analysis was performed for select regions of GREM1.   (5) status post right lumpectomy with sentinel lymph node sampling 06/07/2016 for a pT1c pN1, stage IB invasive ductal carcinoma, grade 2, with negative margins   (6) Mammaprint sent from the final surgical sample was read as high risk, indicating a need for chemotherapy   (7) doxorubicin and cyclophosphamide in dose dense fashion 4 to started 07/12/2016 followed by paclitaxel weekly 12  (8) adjuvant radiation  to follow   (9) to resume tamoxifen at the completion of local treatment  PLAN: Maddyx did much better with her second cycle so we are making no changes for cycle 3.  If she continues to have periods we will consider goserelin after chemotherapy for at least 2 years.  She has developed a slight acneiform rash in the posterior shoulders bilaterally. This is likely due to dexamethasone. It should clear when she goes off that medication  She is already looking beyond the next 2 cycles of doxorubicin and cyclophosphamide to the weekly paclitaxel treatments. She tells me she is going to want to stay on Tuesdays. I have gone ahead and  entered the first few appointments for her  Otherwise she will see [place] again next week, for cycle 3. She knows to call for any problems that may develop before that visit.  :Chauncey Cruel, MD   08/02/2016 1:03 PM Medical Oncology and Hematology [facility] [place], [place] [address] Tel. [phone]    Fax. [phone]

## 2016-08-09 ENCOUNTER — Ambulatory Visit: Payer: Medicaid Other

## 2016-08-09 ENCOUNTER — Ambulatory Visit (HOSPITAL_BASED_OUTPATIENT_CLINIC_OR_DEPARTMENT_OTHER): Payer: Medicaid Other

## 2016-08-09 ENCOUNTER — Other Ambulatory Visit (HOSPITAL_BASED_OUTPATIENT_CLINIC_OR_DEPARTMENT_OTHER): Payer: Medicaid Other

## 2016-08-09 ENCOUNTER — Ambulatory Visit (HOSPITAL_BASED_OUTPATIENT_CLINIC_OR_DEPARTMENT_OTHER): Payer: Medicaid Other | Admitting: Adult Health

## 2016-08-09 ENCOUNTER — Encounter: Payer: Self-pay | Admitting: Adult Health

## 2016-08-09 VITALS — BP 112/59 | HR 69 | Temp 98.2°F | Resp 19 | Ht 64.0 in | Wt 134.5 lb

## 2016-08-09 DIAGNOSIS — Z170 Estrogen receptor positive status [ER+]: Secondary | ICD-10-CM | POA: Diagnosis not present

## 2016-08-09 DIAGNOSIS — Z5189 Encounter for other specified aftercare: Secondary | ICD-10-CM

## 2016-08-09 DIAGNOSIS — C50211 Malignant neoplasm of upper-inner quadrant of right female breast: Secondary | ICD-10-CM | POA: Diagnosis not present

## 2016-08-09 DIAGNOSIS — Z5111 Encounter for antineoplastic chemotherapy: Secondary | ICD-10-CM | POA: Diagnosis not present

## 2016-08-09 LAB — COMPREHENSIVE METABOLIC PANEL
ALT: 17 U/L (ref 0–55)
AST: 24 U/L (ref 5–34)
Albumin: 3.4 g/dL — ABNORMAL LOW (ref 3.5–5.0)
Alkaline Phosphatase: 58 U/L (ref 40–150)
Anion Gap: 8 mEq/L (ref 3–11)
BUN: 8.8 mg/dL (ref 7.0–26.0)
CO2: 22 mEq/L (ref 22–29)
Calcium: 8.4 mg/dL (ref 8.4–10.4)
Chloride: 113 mEq/L — ABNORMAL HIGH (ref 98–109)
Creatinine: 0.8 mg/dL (ref 0.6–1.1)
EGFR: >90 mL/min/{1.73_m2} (ref >90)
Glucose: 89 mg/dl (ref 70–140)
Potassium: 3.6 mEq/L (ref 3.5–5.1)
Sodium: 143 mEq/L (ref 136–145)
Total Bilirubin: 0.33 mg/dL (ref 0.20–1.20)
Total Protein: 5.8 g/dL — ABNORMAL LOW (ref 6.4–8.3)

## 2016-08-09 LAB — CBC WITH DIFFERENTIAL/PLATELET
BASO%: 0.4 % (ref 0.0–2.0)
Basophils Absolute: 0 10*3/uL (ref 0.0–0.1)
EOS%: 1 % (ref 0.0–7.0)
Eosinophils Absolute: 0 10*3/uL (ref 0.0–0.5)
HCT: 34 % — ABNORMAL LOW (ref 34.8–46.6)
HGB: 11.6 g/dL (ref 11.6–15.9)
LYMPH%: 28.6 % (ref 14.0–49.7)
MCH: 32.2 pg (ref 25.1–34.0)
MCHC: 34.1 g/dL (ref 31.5–36.0)
MCV: 94.4 fL (ref 79.5–101.0)
MONO#: 0.5 10*3/uL (ref 0.1–0.9)
MONO%: 11.6 % (ref 0.0–14.0)
NEUT#: 2.4 10*3/uL (ref 1.5–6.5)
NEUT%: 58.4 % (ref 38.4–76.8)
Platelets: 206 10*3/uL (ref 145–400)
RBC: 3.6 10*6/uL — ABNORMAL LOW (ref 3.70–5.45)
RDW: 14.3 % (ref 11.2–14.5)
WBC: 4 10*3/uL (ref 3.9–10.3)
lymph#: 1.2 10*3/uL (ref 0.9–3.3)

## 2016-08-09 MED ORDER — PALONOSETRON HCL INJECTION 0.25 MG/5ML
0.2500 mg | Freq: Once | INTRAVENOUS | Status: AC
  Administered 2016-08-09: 0.25 mg via INTRAVENOUS

## 2016-08-09 MED ORDER — SODIUM CHLORIDE 0.9% FLUSH
10.0000 mL | INTRAVENOUS | Status: DC | PRN
Start: 2016-08-09 — End: 2016-08-09
  Administered 2016-08-09: 10 mL
  Filled 2016-08-09: qty 10

## 2016-08-09 MED ORDER — SODIUM CHLORIDE 0.9 % IV SOLN
590.0000 mg/m2 | Freq: Once | INTRAVENOUS | Status: AC
  Administered 2016-08-09: 1000 mg via INTRAVENOUS
  Filled 2016-08-09: qty 50

## 2016-08-09 MED ORDER — HEPARIN SOD (PORK) LOCK FLUSH 100 UNIT/ML IV SOLN
500.0000 [IU] | Freq: Once | INTRAVENOUS | Status: AC | PRN
  Administered 2016-08-09: 500 [IU]
  Filled 2016-08-09: qty 5

## 2016-08-09 MED ORDER — SODIUM CHLORIDE 0.9 % IJ SOLN
10.0000 mL | Freq: Once | INTRAMUSCULAR | Status: DC
  Filled 2016-08-09: qty 10

## 2016-08-09 MED ORDER — PEGFILGRASTIM 6 MG/0.6ML ~~LOC~~ PSKT
6.0000 mg | PREFILLED_SYRINGE | Freq: Once | SUBCUTANEOUS | Status: AC
  Administered 2016-08-09: 6 mg via SUBCUTANEOUS
  Filled 2016-08-09: qty 0.6

## 2016-08-09 MED ORDER — PALONOSETRON HCL INJECTION 0.25 MG/5ML
INTRAVENOUS | Status: AC
  Filled 2016-08-09: qty 5

## 2016-08-09 MED ORDER — SODIUM CHLORIDE 0.9 % IV SOLN
Freq: Once | INTRAVENOUS | Status: AC
  Administered 2016-08-09: 13:00:00 via INTRAVENOUS
  Filled 2016-08-09: qty 5

## 2016-08-09 MED ORDER — DOXORUBICIN HCL CHEMO IV INJECTION 2 MG/ML
60.0000 mg/m2 | Freq: Once | INTRAVENOUS | Status: AC
  Administered 2016-08-09: 102 mg via INTRAVENOUS
  Filled 2016-08-09: qty 51

## 2016-08-09 MED ORDER — SODIUM CHLORIDE 0.9 % IV SOLN
Freq: Once | INTRAVENOUS | Status: AC
  Administered 2016-08-09: 12:00:00 via INTRAVENOUS

## 2016-08-09 NOTE — Patient Instructions (Signed)

## 2016-08-09 NOTE — Patient Instructions (Signed)
[facility] Discharge Instructions for Patients Receiving Chemotherapy  Today you received the following chemotherapy agents: Adriamycin and Cytoxan.  To help prevent nausea and vomiting after your treatment, we encourage you to take your nausea medication: Compazine. Take one every 6 hours as needed. For nausea on or after 5/18, you may take Zofran as prescribed.  If you develop nausea and vomiting that is not controlled by your nausea medication, call the clinic.   BELOW ARE SYMPTOMS THAT SHOULD BE REPORTED IMMEDIATELY:  *FEVER GREATER THAN 100.5 F  *CHILLS WITH OR WITHOUT FEVER  NAUSEA AND VOMITING THAT IS NOT CONTROLLED WITH YOUR NAUSEA MEDICATION  *UNUSUAL SHORTNESS OF BREATH  *UNUSUAL BRUISING OR BLEEDING  TENDERNESS IN MOUTH AND THROAT WITH OR WITHOUT PRESENCE OF ULCERS  *URINARY PROBLEMS  *BOWEL PROBLEMS  UNUSUAL RASH Items with * indicate a potential emergency and should be followed up as soon as possible.  Feel free to call the clinic should you have any questions or concerns. The clinic phone number is [phone].  Please show the [facility] at check-in to the Emergency Department and triage nurse.

## 2016-08-09 NOTE — Progress Notes (Signed)
Candice [name]  [phone] Fax:(336) [phone]     ID: LEVAEH VICE DOB: February 26, 1976  MR#: [id number]  PJK#:[id number]  Patient Care Team: Jonathon Jordan, MD as PCP - General (Family Medicine) Alphonsa Overall, MD as Consulting Physician (General Surgery) Magrinat, Virgie Dad, MD as Consulting Physician (Oncology) Eppie Gibson, MD as Attending Physician (Radiation Oncology) Osborne Oman, MD as Attending Physician (Obstetrics and Gynecology) Scot Dock, NP OTHER MD:  CHIEF COMPLAINT: estrogen receptor positive breast cancer  CURRENT TREATMENT: Adjuvant chemotherapy   BREAST CANCER HISTORY: From the original intake note:  Candice [name] was evaluated at the [facility] in [place] in December, at which time a mass in the right breast was noted. She was referred for BCCCP and screening mammography was obtained, showing a possible mass in the right breast.on 04/21/2016 she underwent right diagnostic mammography with tomography and ultrasonography at the breast Center, and this confirmedan irregular spiculat in the right breast measuring 1.8 cm, which was not palpable to the mammographer. Ultrasonography confirmed an irregular hypoechoic right breast mass at the 12:30 o'clock position 4 cm from the nipple measuring 2.2 cm. The right axilla was sonographically benign.  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  Her subsequent history is detailed below  INTERVAL HISTORY: Candice [name] is doing well today.  She has been tolerating her adjuvant chemotherapy well.  She is exercising with walking and lifting at the gym.  She is lifting 50 pounds which is much lower than what she normally lifts.  She  says she is taking it easy and slowing getting back into lifting.  She does continue to have mild residual axillary tenderness.    REVIEW OF SYSTEMS: Candice [name] denies fevers, chills, shortness of breath, chest pain, port issues, or any further concerns.  A detailed ROS was non contributory.  PAST MEDICAL HISTORY: Past Medical History:  Diagnosis Date  . Breast cancer ([place]) 05/2016   right  . Chronic constipation   . Family history of breast cancer   . Family history of ovarian cancer   . Family history of pancreatic cancer   . Family history of prostate cancer   . IBS (irritable bowel syndrome)     PAST SURGICAL HISTORY: Past Surgical History:  Procedure Laterality Date  . BARTHOLIN CYST MARSUPIALIZATION  09/04/2002  . BARTHOLIN GLAND CYST EXCISION Left 07/13/2005  . BREAST LUMPECTOMY WITH RADIOACTIVE SEED AND SENTINEL LYMPH NODE BIOPSY Right 06/07/2016   Procedure: BREAST LUMPECTOMY WITH RADIOACTIVE SEED AND SENTINEL LYMPH NODE BIOPSY;  Surgeon: Alphonsa Overall, MD;  Location: [facility];  Service: General;  Laterality: Right;  . HYSTEROSCOPY  2016  . MYOMECTOMY  07/20/2004  . OVARIAN CYST REMOVAL Right 07/20/2004  . PORTACATH PLACEMENT N/A 07/05/2016   Procedure: INSERTION PORT-A-CATH WITH [place];  Surgeon: Alphonsa Overall, MD;  Location: [facility];  Service: General;  Laterality: N/A;    FAMILY HISTORY Family History  Problem Relation Age of Onset  . Breast cancer Mother 19  . Pancreatic cancer Paternal Grandmother   . Prostate cancer Paternal Grandfather   . Ovarian cancer Other        MGMs sister  . Breast cancer Other        mother's maternal first cousin  The patient's father, Candice [name], lives in [place] and works as a Geneticist, molecular. The patient's mother is a Quarry manager. The patient has no brother, one sister, who works in [place] as a Network engineer. The patient's mother had breast cancer, stage 0, diagnosed at age 32. There are 2 maternal relatives  (mother's sister and mother's niece) with breast cancer, both postmenopausal.  GYNECOLOGIC HISTORY:  Menarche age 41, the patient is GX P0. She has regular periods, most recently mid-January. She is on oral contraceptives at present. No LMP recorded.   SOCIAL HISTORY:  The patient has a PhD degree and has worked for [employer] here and in [place] but is currently unemployed.  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 She generally lives in [place] by herself although she is currently staying at her parents.    ADVANCED DIRECTIVES: Not in place. The patient tells me she intends to name her sister as her healthcare power of attorney.   HEALTH MAINTENANCE: Social History  Substance Use Topics  . Smoking status: Never Smoker  . Smokeless tobacco: Never Used  . Alcohol use 0.6 oz/week    1 Glasses of wine per week     Comment: socially     Colonoscopy: July 2016, in [place]  PAP:  Bone density: Never   Allergies  Allergen Reactions  . Amoxicillin Rash    Current Outpatient Prescriptions  Medication Sig Dispense Refill  . Coenzyme Q10 (COQ10 PO) Take by mouth. LIQUID    . dexamethasone (DECADRON) 4 MG tablet Take 2 tablets by mouth once a day on the day after chemotherapy and then take 2 tablets two times a day for 2 days. Take with food. 30 tablet 1  . lidocaine-prilocaine (EMLA) cream Apply 1 application topically as needed. Apply over power port site about 30 to 60 minutes before chemotherapy. 30 g 0  . loratadine (CLARITIN) 10 MG tablet Take 1 tablet (10 mg total) by mouth daily. 30 tablet 1  . LORazepam (ATIVAN) 0.5 MG tablet Take 1 tablet (0.5 mg total) by mouth at bedtime as needed (Nausea or vomiting). 30 tablet 0  . OVER THE COUNTER MEDICATION Take 7 tablets by mouth 2 (two) times daily. SUPER COLON CLEANSE    . prochlorperazine (COMPAZINE) 10 MG tablet Take 1 tablet (10 mg total) by mouth every 6 (six) hours as needed (Nausea or vomiting).  30 tablet 1  . tamoxifen (NOLVADEX) 20 MG tablet Take 1 tablet (20 mg total) by mouth daily. 30 tablet 4   Current Facility-Administered Medications  Medication Dose Route Frequency Provider Last Rate Last Dose  . sodium chloride 0.9 % injection 10 mL  10 mL Intravenous Once Magrinat, Virgie Dad, MD        OBJECTIVE:  Vitals:   08/09/16 1057  BP: (!) 112/59  Pulse: 69  Resp: 19  Temp: 98.2 F (36.8 C)     Body mass index is 23.09 kg/m.    ECOG FS:0  GENERAL: Patient is a well appearing female in no acute distress HEENT:  Sclerae anicteric.  Oropharynx clear and moist. No ulcerations or evidence of oropharyngeal candidiasis. Neck is supple.  NODES:  No cervical, supraclavicular, or axillary lymphadenopathy palpated.  BREAST EXAM:  Lumpectomy site well healed, no swelling or tenderness LUNGS:  Clear to auscultation bilaterally.  No wheezes or rhonchi. HEART:  Regular rate and rhythm. No murmur appreciated. ABDOMEN:  Soft, nontender.  Positive, normoactive bowel sounds. No organomegaly palpated. MSK:  No  focal spinal tenderness to palpation. Full range of motion bilaterally in the upper extremities. EXTREMITIES:  No peripheral edema.   SKIN:  Clear with no obvious rashes or skin changes. No nail dyscrasia. NEURO:  Nonfocal. Well oriented.  Appropriate affect.  LAB RESULTS:  CMP     Component Value Date/Time   NA 143 08/09/2016 1019   K 3.6 08/09/2016 1019   CO2 22 08/09/2016 1019   GLUCOSE 89 08/09/2016 1019   BUN 8.8 08/09/2016 1019   CREATININE 0.8 08/09/2016 1019   CALCIUM 8.4 08/09/2016 1019   PROT 5.8 (L) 08/09/2016 1019   ALBUMIN 3.4 (L) 08/09/2016 1019   AST 24 08/09/2016 1019   ALT 17 08/09/2016 1019   ALKPHOS 58 08/09/2016 1019   BILITOT 0.33 08/09/2016 1019    INo results found for: SPEP, UPEP  Lab Results  Component Value Date   WBC 4.0 08/09/2016   NEUTROABS 2.4 08/09/2016   HGB 11.6 08/09/2016   HCT 34.0 (L) 08/09/2016   MCV 94.4 08/09/2016   PLT  206 08/09/2016      Chemistry      Component Value Date/Time   NA 143 08/09/2016 1019   K 3.6 08/09/2016 1019   CO2 22 08/09/2016 1019   BUN 8.8 08/09/2016 1019   CREATININE 0.8 08/09/2016 1019      Component Value Date/Time   CALCIUM 8.4 08/09/2016 1019   ALKPHOS 58 08/09/2016 1019   AST 24 08/09/2016 1019   ALT 17 08/09/2016 1019   BILITOT 0.33 08/09/2016 1019       No results found for: LABCA2  No components found for: LABCA125  No results for input(s): INR in the last 168 hours.  Urinalysis No results found for: COLORURINE, APPEARANCEUR, LABSPEC, PHURINE, GLUCOSEU, HGBUR, BILIRUBINUR, KETONESUR, PROTEINUR, UROBILINOGEN, NITRITE, LEUKOCYTESUR   STUDIES: No results found.  ELIGIBLE FOR AVAILABLE RESEARCH PROTOCOL: no  ASSESSMENT: 41 y.o. [place] woman currently residing in [place], status post right breast upper inner quadrant biopsy 04/21/2016 for a clinical T2 N0, stage IIa invasive ductal carcinoma, grade 2, estrogen and progesterone receptor positive, HER-2 nonamplified, with an MIB-1 of 20%  (a) biopsy of 2 additional suspicious areas in the right breast 05/12/2016 was benign  (1) started tamoxifen 04/27/2016, Discontinued at the start of chemotherapy  (2) Oncotype DX obtained from the original biopsy showed a score of 14, predicting a 10 year risk of recurrence outside the breast of 9% if the patient's only systemic therapy is tamoxifen for 5 years. It also predicts no benefit from chemotherapy.   (3) patient is not interested in fertility preservation  (4) genetics testing 04/27/2016 through the [facility] gene panel offered by [facility] found no deleterious mutations in APC, ATM, BARD1, BMPR1A, BRCA1, BRCA2, BRIP1, CHD1, CDK4, CDKN2A, CHEK2, EPCAM (large rearrangement only), MLH1, MSH2, MSH6, MUTYH, NBN, PALB2, PMS2, PTEN, RAD51C, RAD51D, SMAD4, STK11, and TP53. Sequencing was performed for select regions of POLE and POLD1, and  large rearrangement analysis was performed for select regions of GREM1.   (5) status post right lumpectomy with sentinel lymph node sampling 06/07/2016 for a pT1c pN1, stage IB invasive ductal carcinoma, grade 2, with negative margins   (6) Mammaprint sent from the final surgical sample was read as high risk, indicating a need for chemotherapy   (7) doxorubicin and cyclophosphamide in dose dense fashion 4 to started 07/12/2016 followed by paclitaxel weekly 12  (8) adjuvant radiation  to follow   (9) to resume tamoxifen at the  completion of local treatment  PLAN: Kyndell is doing well.  Her labs are normal and I reviewed them with her in detail.  She will proceed with chemotherapy today.  She and I had a long of discussion about her cancer, and monitoring for recurrence once her treatment is finished.    A total of (30) minutes of face-to-face time was spent with this patient with greater than 50% of that time in counseling and care-coordination.   Scot Dock, NP   08/09/2016 11:17 AM Medical Oncology and Hematology [facility] [address] [place], [place] [address] Tel. [phone]    Fax. [phone]

## 2016-08-16 ENCOUNTER — Other Ambulatory Visit (HOSPITAL_BASED_OUTPATIENT_CLINIC_OR_DEPARTMENT_OTHER): Payer: Medicaid Other

## 2016-08-16 ENCOUNTER — Ambulatory Visit (HOSPITAL_BASED_OUTPATIENT_CLINIC_OR_DEPARTMENT_OTHER): Payer: Medicaid Other | Admitting: Oncology

## 2016-08-16 VITALS — BP 111/55 | HR 92 | Temp 98.2°F | Resp 18 | Ht 64.0 in | Wt 136.7 lb

## 2016-08-16 DIAGNOSIS — Z170 Estrogen receptor positive status [ER+]: Secondary | ICD-10-CM | POA: Diagnosis not present

## 2016-08-16 DIAGNOSIS — C50211 Malignant neoplasm of upper-inner quadrant of right female breast: Secondary | ICD-10-CM

## 2016-08-16 LAB — COMPREHENSIVE METABOLIC PANEL
ALT: 27 U/L (ref 0–55)
AST: 25 U/L (ref 5–34)
Albumin: 3.6 g/dL (ref 3.5–5.0)
Alkaline Phosphatase: 88 U/L (ref 40–150)
Anion Gap: 6 mEq/L (ref 3–11)
BUN: 7.5 mg/dL (ref 7.0–26.0)
CO2: 28 mEq/L (ref 22–29)
Calcium: 9.3 mg/dL (ref 8.4–10.4)
Chloride: 105 mEq/L (ref 98–109)
Creatinine: 0.8 mg/dL (ref 0.6–1.1)
EGFR: >90 mL/min/{1.73_m2} (ref >90)
Glucose: 110 mg/dl (ref 70–140)
Potassium: 4.1 mEq/L (ref 3.5–5.1)
Sodium: 140 mEq/L (ref 136–145)
Total Bilirubin: 0.28 mg/dL (ref 0.20–1.20)
Total Protein: 6 g/dL — ABNORMAL LOW (ref 6.4–8.3)

## 2016-08-16 LAB — CBC WITH DIFFERENTIAL/PLATELET
BASO%: 0.5 % (ref 0.0–2.0)
Basophils Absolute: 0 10*3/uL (ref 0.0–0.1)
EOS%: 0.8 % (ref 0.0–7.0)
Eosinophils Absolute: 0 10*3/uL (ref 0.0–0.5)
HCT: 32 % — ABNORMAL LOW (ref 34.8–46.6)
HGB: 11.2 g/dL — ABNORMAL LOW (ref 11.6–15.9)
LYMPH%: 26.8 % (ref 14.0–49.7)
MCH: 32.4 pg (ref 25.1–34.0)
MCHC: 35 g/dL (ref 31.5–36.0)
MCV: 92.5 fL (ref 79.5–101.0)
MONO#: 0.4 10*3/uL (ref 0.1–0.9)
MONO%: 9.8 % (ref 0.0–14.0)
NEUT#: 2.3 10*3/uL (ref 1.5–6.5)
NEUT%: 62.1 % (ref 38.4–76.8)
Platelets: 165 10*3/uL (ref 145–400)
RBC: 3.46 10*6/uL — ABNORMAL LOW (ref 3.70–5.45)
RDW: 14.6 % — ABNORMAL HIGH (ref 11.2–14.5)
WBC: 3.8 10*3/uL — ABNORMAL LOW (ref 3.9–10.3)
lymph#: 1 10*3/uL (ref 0.9–3.3)

## 2016-08-16 NOTE — Progress Notes (Signed)
[facility]  [phone]) [phone] Fax:(336) [phone]     ID: ELYSABETH AUST DOB: September 20, 1975  MR#: [id number]  ZHY#:[id number]  Patient Care Team: Jonathon Jordan, MD as PCP - General (Family Medicine) Alphonsa Overall, MD as Consulting Physician (General Surgery) Rhesa Forsberg, Virgie Dad, MD as Consulting Physician (Oncology) Eppie Gibson, MD as Attending Physician (Radiation Oncology) Osborne Oman, MD as Attending Physician (Obstetrics and Gynecology) Chauncey Cruel, MD OTHER MD:  CHIEF COMPLAINT: estrogen receptor positive breast cancer  CURRENT TREATMENT: Adjuvant chemotherapy   BREAST CANCER HISTORY: From the original intake note:  Candice [name] was evaluated at the center [facility] women[facility] in [place] in December, at which time a mass in the right breast was noted. She was referred for BCCCP and screening mammography was obtained, showing a possible mass in the right breast.on 04/21/2016 she underwent right diagnostic mammography with tomography and ultrasonography at the breast Center, and this confirmedan irregular spiculat in the right breast measuring 1.8 cm, which was not palpable to the mammographer. Ultrasonography confirmed an irregular hypoechoic right breast mass at the 12:30 o'clock position 4 cm from the nipple measuring 2.2 cm. The right axilla was sonographically benign.  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  Her subsequent history is detailed below  INTERVAL HISTORY: Candice [name] returns today for follow-up of her estrogen receptor positive breast cancer accompanied by her sister. Today is day 8 cycle 3 of 4 planned cycles of doxorubicin and cyclophosphamide given in dose dense fashion, to  be followed by weekly paclitaxel 12.  REVIEW OF SYSTEMS: Candice [name] continues to tolerate chemotherapy remarkably well. She goes to the gym regularly. She has had mild nausea but no vomiting problems. She feels slightly tired. She is walking rather than running for exercise. She has a good appetite and no taste alteration. She is managing her chronic constipation. Detailed review of systems today was otherwise stable.  PAST MEDICAL HISTORY: Past Medical History:  Diagnosis Date  . Breast cancer ([place]) 05/2016   right  . Chronic constipation   . Family history of breast cancer   . Family history of ovarian cancer   . Family history of pancreatic cancer   . Family history of prostate cancer   . IBS (irritable bowel syndrome)     PAST SURGICAL HISTORY: Past Surgical History:  Procedure Laterality Date  . BARTHOLIN CYST MARSUPIALIZATION  09/04/2002  . BARTHOLIN GLAND CYST EXCISION Left 07/13/2005  . BREAST LUMPECTOMY WITH RADIOACTIVE SEED AND SENTINEL LYMPH NODE BIOPSY Right 06/07/2016   Procedure: BREAST LUMPECTOMY WITH RADIOACTIVE SEED AND SENTINEL LYMPH NODE BIOPSY;  Surgeon: Alphonsa Overall, MD;  Location: [facility];  Service: General;  Laterality: Right;  . HYSTEROSCOPY  2016  . MYOMECTOMY  07/20/2004  . OVARIAN CYST REMOVAL Right 07/20/2004  . PORTACATH PLACEMENT N/A 07/05/2016   Procedure: INSERTION PORT-A-CATH WITH [place];  Surgeon: Alphonsa Overall, MD;  Location: [facility];  Service: General;  Laterality: N/A;    FAMILY HISTORY Family History  Problem Relation Age of Onset  . Breast cancer Mother 70  . Pancreatic cancer Paternal Grandmother   . Prostate cancer Paternal Grandfather   . Ovarian cancer Other        MGMs sister  . Breast cancer Other  mother's maternal first cousin  The patient's father, Candice [name], lives in [place] and works as a Geneticist, molecular. The patient's mother is a Quarry manager. The patient has no brother, one sister, who works in  [place] as a Network engineer. The patient's mother had breast cancer, stage 0, diagnosed at age 15. There are 2 maternal relatives (mother's sister and mother's niece) with breast cancer, both postmenopausal.  GYNECOLOGIC HISTORY:  Menarche age 46, the patient is GX P0. She has regular periods, most recently mid-January. She is on oral contraceptives at present. No LMP recorded.   SOCIAL HISTORY:  The patient has a PhD degree and has worked for [employer] here and in [place] but is currently unemployed.  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 She generally lives in [place] by herself although she is currently staying at her parents.    ADVANCED DIRECTIVES: Not in place. The patient tells me she intends to name her sister as her healthcare power of attorney.   HEALTH MAINTENANCE: Social History  Substance Use Topics  . Smoking status: Never Smoker  . Smokeless tobacco: Never Used  . Alcohol use 0.6 oz/week    1 Glasses of wine per week     Comment: socially     Colonoscopy: July 2016, in [place]  PAP:  Bone density: Never   Allergies  Allergen Reactions  . Amoxicillin Rash    Current Outpatient Prescriptions  Medication Sig Dispense Refill  . Coenzyme Q10 (COQ10 PO) Take by mouth. LIQUID    . dexamethasone (DECADRON) 4 MG tablet Take 2 tablets by mouth once a day on the day after chemotherapy and then take 2 tablets two times a day for 2 days. Take with food. 30 tablet 1  . lidocaine-prilocaine (EMLA) cream Apply 1 application topically as needed. Apply over power port site about 30 to 60 minutes before chemotherapy. 30 g 0  . loratadine (CLARITIN) 10 MG tablet Take 1 tablet (10 mg total) by mouth daily. 30 tablet 1  . LORazepam (ATIVAN) 0.5 MG tablet Take 1 tablet (0.5 mg total) by mouth at bedtime as needed (Nausea or vomiting). 30 tablet 0  . OVER THE COUNTER MEDICATION Take 7 tablets by mouth 2 (two) times daily. SUPER COLON CLEANSE     . prochlorperazine (COMPAZINE) 10 MG tablet Take 1 tablet (10 mg total) by mouth every 6 (six) hours as needed (Nausea or vomiting). 30 tablet 1  . tamoxifen (NOLVADEX) 20 MG tablet Take 1 tablet (20 mg total) by mouth daily. 30 tablet 4   Current Facility-Administered Medications  Medication Dose Route Frequency Provider Last Rate Last Dose  . sodium chloride 0.9 % injection 10 mL  10 mL Intravenous Once Ahan Eisenberger, Virgie Dad, MD        OBJECTIVE:Young African-American woman in no acute distress  Vitals:   08/16/16 1240  BP: (!) 111/55  Pulse: 92  Resp: 18  Temp: 98.2 F (36.8 C)     Body mass index is 23.46 kg/m.    ECOG FS:0  Sclerae unicteric, EOMs intact Oropharynx clear and moist No cervical or supraclavicular adenopathy Lungs no rales or rhonchi Heart regular rate and rhythm Abd soft, nontender, positive bowel sounds MSK no focal spinal tenderness, no upper extremity lymphedema Neuro: nonfocal, well oriented, appropriate affect Breasts: Deferred   LAB RESULTS:  CMP     Component Value Date/Time   NA 140 08/16/2016 1152   K 4.1 08/16/2016 1152   CO2 28 08/16/2016  1152   GLUCOSE 110 08/16/2016 1152   BUN 7.5 08/16/2016 1152   CREATININE 0.8 08/16/2016 1152   CALCIUM 9.3 08/16/2016 1152   PROT 6.0 (L) 08/16/2016 1152   ALBUMIN 3.6 08/16/2016 1152   AST 25 08/16/2016 1152   ALT 27 08/16/2016 1152   ALKPHOS 88 08/16/2016 1152   BILITOT 0.28 08/16/2016 1152    INo results found for: SPEP, UPEP  Lab Results  Component Value Date   WBC 3.8 (L) 08/16/2016   NEUTROABS 2.3 08/16/2016   HGB 11.2 (L) 08/16/2016   HCT 32.0 (L) 08/16/2016   MCV 92.5 08/16/2016   PLT 165 08/16/2016      Chemistry      Component Value Date/Time   NA 140 08/16/2016 1152   K 4.1 08/16/2016 1152   CO2 28 08/16/2016 1152   BUN 7.5 08/16/2016 1152   CREATININE 0.8 08/16/2016 1152      Component Value Date/Time   CALCIUM 9.3 08/16/2016 1152   ALKPHOS 88 08/16/2016 1152   AST  25 08/16/2016 1152   ALT 27 08/16/2016 1152   BILITOT 0.28 08/16/2016 1152       No results found for: LABCA2  No components found for: LABCA125  No results for input(s): INR in the last 168 hours.  Urinalysis No results found for: COLORURINE, APPEARANCEUR, LABSPEC, PHURINE, GLUCOSEU, HGBUR, BILIRUBINUR, KETONESUR, PROTEINUR, UROBILINOGEN, NITRITE, LEUKOCYTESUR   STUDIES: No results found.  ELIGIBLE FOR AVAILABLE RESEARCH PROTOCOL: no  ASSESSMENT: 41 y.o. [place] woman currently residing in [place], status post right breast upper inner quadrant biopsy 04/21/2016 for a clinical T2 N0, stage IIa invasive ductal carcinoma, grade 2, estrogen and progesterone receptor positive, HER-2 nonamplified, with an MIB-1 of 20%  (a) biopsy of 2 additional suspicious areas in the right breast 05/12/2016 was benign  (1) started tamoxifen 04/27/2016, Discontinued at the start of chemotherapy  (2) Oncotype DX obtained from the original biopsy showed a score of 14, predicting a 10 year risk of recurrence outside the breast of 9% if the patient's only systemic therapy is tamoxifen for 5 years. It also predicts no benefit from chemotherapy.   (3) patient is not interested in fertility preservation  (4) genetics testing 04/27/2016 through the [facility] gene panel offered by [facility] found no deleterious mutations in APC, ATM, BARD1, BMPR1A, BRCA1, BRCA2, BRIP1, CHD1, CDK4, CDKN2A, CHEK2, EPCAM (large rearrangement only), MLH1, MSH2, MSH6, MUTYH, NBN, PALB2, PMS2, PTEN, RAD51C, RAD51D, SMAD4, STK11, and TP53. Sequencing was performed for select regions of POLE and POLD1, and large rearrangement analysis was performed for select regions of GREM1.   (5) status post right lumpectomy with sentinel lymph node sampling 06/07/2016 for a pT1c pN1, stage IB invasive ductal carcinoma, grade 2, with negative margins   (6) Mammaprint sent from the final surgical sample was read as  high risk, indicating a need for chemotherapy   (7) doxorubicin and cyclophosphamide in dose dense fashion 4 to started 07/12/2016 followed by paclitaxel weekly 12  (8) adjuvant radiation  to follow   (9) to resume tamoxifen at the completion of local treatment  PLAN: Landri continues to tolerate her treatment well and we are not making any major changes in her plan. She will return in 1 week for her final dose of doxorubicin and cyclophosphamide and then I will see her again a week later to discuss the upcoming Taxol treatment.  She has a good understanding of the overall plan. She agrees with it. She knows  to call for any problems that may develop before her next visit here.  Chauncey Cruel, MD   08/16/2016 12:48 PM Medical Oncology and Hematology [facility] [address] [place], [place] [address] Tel. [phone]    Fax. [phone]

## 2016-08-18 ENCOUNTER — Ambulatory Visit (HOSPITAL_BASED_OUTPATIENT_CLINIC_OR_DEPARTMENT_OTHER)
Admission: RE | Admit: 2016-08-18 | Discharge: 2016-08-18 | Disposition: A | Discharge status: Home / Self Care | Payer: Medicaid Other | Source: Ambulatory Visit | Attending: Internal Medicine | Admitting: Internal Medicine

## 2016-08-18 ENCOUNTER — Encounter (HOSPITAL_COMMUNITY): Payer: Self-pay | Admitting: Internal Medicine

## 2016-08-18 ENCOUNTER — Ambulatory Visit (HOSPITAL_COMMUNITY)
Admission: RE | Admit: 2016-08-18 | Discharge: 2016-08-18 | Disposition: A | Discharge status: Home / Self Care | Payer: Medicaid Other | Source: Ambulatory Visit | Attending: Internal Medicine | Admitting: Internal Medicine

## 2016-08-18 VITALS — BP 114/60 | HR 68 | Wt 136.8 lb

## 2016-08-18 DIAGNOSIS — C50211 Malignant neoplasm of upper-inner quadrant of right female breast: Secondary | ICD-10-CM

## 2016-08-18 DIAGNOSIS — Z8042 Family history of malignant neoplasm of prostate: Secondary | ICD-10-CM | POA: Diagnosis not present

## 2016-08-18 DIAGNOSIS — Z170 Estrogen receptor positive status [ER+]: Secondary | ICD-10-CM | POA: Diagnosis not present

## 2016-08-18 DIAGNOSIS — Z8041 Family history of malignant neoplasm of ovary: Secondary | ICD-10-CM | POA: Insufficient documentation

## 2016-08-18 DIAGNOSIS — Z803 Family history of malignant neoplasm of breast: Secondary | ICD-10-CM | POA: Diagnosis not present

## 2016-08-18 DIAGNOSIS — Z79899 Other long term (current) drug therapy: Secondary | ICD-10-CM | POA: Diagnosis not present

## 2016-08-18 DIAGNOSIS — Z880 Allergy status to penicillin: Secondary | ICD-10-CM | POA: Insufficient documentation

## 2016-08-18 DIAGNOSIS — Z800 Family history of malignant neoplasm of digestive organs: Secondary | ICD-10-CM | POA: Insufficient documentation

## 2016-08-18 DIAGNOSIS — C50911 Malignant neoplasm of unspecified site of right female breast: Secondary | ICD-10-CM | POA: Diagnosis not present

## 2016-08-18 NOTE — Addendum Note (Signed)
Encounter addended by: Kennieth Rad, RN on: 08/18/2016 12:26 PM<BR>    Actions taken: Sign clinical note

## 2016-08-18 NOTE — Addendum Note (Signed)
Encounter addended by: Kennieth Rad, RN on: 08/18/2016 12:25 PM<BR>    Actions taken: Order list changed, Diagnosis association updated

## 2016-08-18 NOTE — Progress Notes (Signed)
CARDIO-ONCOLOGY CLINIC NOTE  Referring Physician: Magrinat   HPI:  41 y/o woman with PhD in environmental science and policy with right breast cancer referred by Dr. Jana Hakim for enrollment into the [facility].   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  No PMHx except for constipation for which she has had very thorough work-up and told it was stress. Denies any h/o heart disease.  Found to have R breast mass on routine exam with her PCP.   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  Treatment regimen:  (1) started tamoxifen 04/27/2016, Discontinued at the start of chemotherapy  (5) status post right lumpectomy with sentinel lymph node sampling 06/07/2016 for apT1c pN1, stage IB invasive ductal carcinoma, grade 2, with negative margins   (7) doxorubicin and cyclophosphamide in dose dense fashion 4 to started 07/12/2016 followed by paclitaxel weekly 12  (8) adjuvant radiation  to follow   (9) to resume tamoxifen at the completion of local treatment  Has completed 3 rounds of adriamycin. Doing well. Fatigued on day of chemo but then recovers. Denies any dyspnea, orthopnea or PND. No edema. Has been getting back to gym.   Echo (07/11/16): EF 55-60% Lateral s' unreadable. GLS - 22.6% Echo 5/24/18L EF 60-65% Lateral s 11.7 cm/s GLS - 25.2% Personally reviewed  Past Medical History:  Diagnosis Date  . Breast cancer ([place]) 05/2016   right  . Chronic constipation   . Family history of breast cancer   . Family history of ovarian cancer   . Family history of pancreatic cancer   . Family history of prostate cancer   . IBS (irritable bowel syndrome)     Current Outpatient Prescriptions  Medication Sig Dispense  Refill  . Coenzyme Q10 (COQ10 PO) Take by mouth. LIQUID    . dexamethasone (DECADRON) 4 MG tablet Take 2 tablets by mouth once a day on the day after chemotherapy and then take 2 tablets two times a day for 2 days. Take with food. 30 tablet 1  . loratadine (CLARITIN) 10 MG tablet Take 1 tablet (10 mg total) by mouth daily. 30 tablet 1  . LORazepam (ATIVAN) 0.5 MG tablet Take 1 tablet (0.5 mg total) by mouth at bedtime as needed (Nausea or vomiting). 30 tablet 0  . OVER THE COUNTER MEDICATION Take 7 tablets by mouth 2 (two) times daily. SUPER COLON CLEANSE    . prochlorperazine (COMPAZINE) 10 MG tablet Take 1 tablet (10 mg total) by mouth every 6 (six) hours as needed (Nausea or vomiting). 30 tablet 1  . lidocaine-prilocaine (EMLA) cream Apply 1 application topically as needed. Apply over power port site about 30 to 60 minutes before chemotherapy. 30 g 0  . tamoxifen (NOLVADEX) 20 MG tablet Take 1 tablet (20 mg total) by mouth daily. (Patient not taking: Reported on 08/18/2016) 30 tablet 4   Current Facility-Administered Medications  Medication Dose Route Frequency Provider Last Rate Last Dose  . sodium chloride 0.9 % injection 10 mL  10 mL Intravenous Once Magrinat, Virgie Dad, MD        Allergies  Allergen Reactions  . Amoxicillin Rash      Social History   Social History  . Marital status: Single    Spouse name: N/A  . Number of  children: N/A  . Years of education: N/A   Occupational History  . Not on file.   Social History Main Topics  . Smoking status: Never Smoker  . Smokeless tobacco: Never Used  . Alcohol use 0.6 oz/week    1 Glasses of wine per week     Comment: socially  . Drug use: No  . Sexual activity: Not Currently    Partners: Male    Birth control/ protection: None   Other Topics Concern  . Not on file   Social History Narrative  . No narrative on file      Family History  Problem Relation Age of Onset  . Breast cancer Mother 89  . Pancreatic cancer  Paternal Grandmother   . Prostate cancer Paternal Grandfather   . Ovarian cancer Other        MGMs sister  . Breast cancer Other        mother's maternal first cousin    Vitals:   08/18/16 1144  BP: 114/60  Pulse: 68  SpO2: 100%  Weight: 136 lb 12 oz (62 kg)    PHYSICAL EXAM: General:  Well appearing. No resp difficulty HEENT: normal Neck: supple. no JVD. Left portacath Carotids 2+ bilat; no bruits. No lymphadenopathy or thryomegaly appreciated. Cor: PMI nondisplaced. Regular rate & rhythm. No rubs, gallops or murmurs. Lungs: clear Abdomen: soft, nontender, nondistended. No hepatosplenomegaly. No bruits or masses. Good bowel sounds. Extremities: no cyanosis, clubbing, rash, edema Neuro: alert & orientedx3, cranial nerves grossly intact. moves all 4 extremities w/o difficulty. Affect pleasant  ASSESSMENT & PLAN:  1. Right breast CA   - path; invasive ductal carcinoma, grade 2, estrogen receptor 95% positive, progesterone receptor 100% positive, with strong staining intensity, with an MIB-1 of 20%, and no HER-2 amplification, the signals ratio being 1.71 and the number per cell 3.00.  - now s/p lumpectomy.   - I reviewed echos personally. EF and Doppler parameters stable. No HF on exam. Will see back in 4-6 months with echo to ensure stability. I explained incidence of Adriamycin cardiotoxicity in detail include small possibility of very delayed toxicity.   Glori Bickers, MD  12:13 PM

## 2016-08-18 NOTE — Progress Notes (Signed)
Echocardiogram 2D Echocardiogram has been performed.  Candice [name] 08/18/2016, 11:07 AM

## 2016-08-18 NOTE — Patient Instructions (Signed)
Follow up with Echo in 4 Months.

## 2016-08-23 ENCOUNTER — Other Ambulatory Visit (HOSPITAL_BASED_OUTPATIENT_CLINIC_OR_DEPARTMENT_OTHER): Payer: Medicaid Other

## 2016-08-23 ENCOUNTER — Ambulatory Visit: Payer: Medicaid Other

## 2016-08-23 ENCOUNTER — Ambulatory Visit (HOSPITAL_BASED_OUTPATIENT_CLINIC_OR_DEPARTMENT_OTHER): Payer: Medicaid Other

## 2016-08-23 ENCOUNTER — Encounter: Payer: Self-pay | Admitting: Adult Health

## 2016-08-23 ENCOUNTER — Encounter: Payer: Self-pay | Admitting: *Deleted

## 2016-08-23 ENCOUNTER — Ambulatory Visit (HOSPITAL_BASED_OUTPATIENT_CLINIC_OR_DEPARTMENT_OTHER): Payer: Medicaid Other | Admitting: Adult Health

## 2016-08-23 VITALS — BP 109/62 | HR 64 | Temp 97.8°F | Resp 20 | Ht 64.0 in | Wt 136.9 lb

## 2016-08-23 DIAGNOSIS — C50211 Malignant neoplasm of upper-inner quadrant of right female breast: Secondary | ICD-10-CM | POA: Diagnosis not present

## 2016-08-23 DIAGNOSIS — Z170 Estrogen receptor positive status [ER+]: Secondary | ICD-10-CM | POA: Diagnosis not present

## 2016-08-23 DIAGNOSIS — Z95828 Presence of other vascular implants and grafts: Secondary | ICD-10-CM

## 2016-08-23 DIAGNOSIS — Z5189 Encounter for other specified aftercare: Secondary | ICD-10-CM | POA: Diagnosis not present

## 2016-08-23 DIAGNOSIS — Z5111 Encounter for antineoplastic chemotherapy: Secondary | ICD-10-CM

## 2016-08-23 LAB — CBC WITH DIFFERENTIAL/PLATELET
BASO%: 0.6 % (ref 0.0–2.0)
Basophils Absolute: 0 10*3/uL (ref 0.0–0.1)
EOS%: 0.8 % (ref 0.0–7.0)
Eosinophils Absolute: 0 10*3/uL (ref 0.0–0.5)
HCT: 35.8 % (ref 34.8–46.6)
HGB: 12.2 g/dL (ref 11.6–15.9)
LYMPH%: 16.6 % (ref 14.0–49.7)
MCH: 32.5 pg (ref 25.1–34.0)
MCHC: 34.1 g/dL (ref 31.5–36.0)
MCV: 95.5 fL (ref 79.5–101.0)
MONO#: 0.5 10*3/uL (ref 0.1–0.9)
MONO%: 8.9 % (ref 0.0–14.0)
NEUT#: 4.2 10*3/uL (ref 1.5–6.5)
NEUT%: 73.1 % (ref 38.4–76.8)
Platelets: 245 10*3/uL (ref 145–400)
RBC: 3.75 10*6/uL (ref 3.70–5.45)
RDW: 16.6 % — ABNORMAL HIGH (ref 11.2–14.5)
WBC: 5.8 10*3/uL (ref 3.9–10.3)
lymph#: 1 10*3/uL (ref 0.9–3.3)

## 2016-08-23 LAB — COMPREHENSIVE METABOLIC PANEL
ALT: 25 U/L (ref 0–55)
AST: 27 U/L (ref 5–34)
Albumin: 3.9 g/dL (ref 3.5–5.0)
Alkaline Phosphatase: 65 U/L (ref 40–150)
Anion Gap: 7 mEq/L (ref 3–11)
BUN: 11.6 mg/dL (ref 7.0–26.0)
CO2: 26 mEq/L (ref 22–29)
Calcium: 9.3 mg/dL (ref 8.4–10.4)
Chloride: 106 mEq/L (ref 98–109)
Creatinine: 0.8 mg/dL (ref 0.6–1.1)
EGFR: >90 mL/min/{1.73_m2} (ref >90)
Glucose: 87 mg/dl (ref 70–140)
Potassium: 3.9 mEq/L (ref 3.5–5.1)
Sodium: 139 mEq/L (ref 136–145)
Total Bilirubin: 0.27 mg/dL (ref 0.20–1.20)
Total Protein: 6.4 g/dL (ref 6.4–8.3)

## 2016-08-23 MED ORDER — SODIUM CHLORIDE 0.9% FLUSH
10.0000 mL | INTRAVENOUS | Status: DC | PRN
  Administered 2016-08-23: 10 mL via INTRAVENOUS
  Filled 2016-08-23: qty 10

## 2016-08-23 MED ORDER — SODIUM CHLORIDE 0.9 % IV SOLN
Freq: Once | INTRAVENOUS | Status: AC
  Administered 2016-08-23: 13:00:00 via INTRAVENOUS

## 2016-08-23 MED ORDER — HEPARIN SOD (PORK) LOCK FLUSH 100 UNIT/ML IV SOLN
500.0000 [IU] | Freq: Once | INTRAVENOUS | Status: AC | PRN
  Administered 2016-08-23: 500 [IU]
  Filled 2016-08-23: qty 5

## 2016-08-23 MED ORDER — SODIUM CHLORIDE 0.9% FLUSH
10.0000 mL | INTRAVENOUS | Status: DC | PRN
  Administered 2016-08-23: 10 mL
  Filled 2016-08-23: qty 10

## 2016-08-23 MED ORDER — PALONOSETRON HCL INJECTION 0.25 MG/5ML
0.2500 mg | Freq: Once | INTRAVENOUS | Status: AC
  Administered 2016-08-23: 0.25 mg via INTRAVENOUS

## 2016-08-23 MED ORDER — CYCLOPHOSPHAMIDE CHEMO INJECTION 1 GM
590.0000 mg/m2 | Freq: Once | INTRAMUSCULAR | Status: AC
  Administered 2016-08-23: 1000 mg via INTRAVENOUS
  Filled 2016-08-23: qty 50

## 2016-08-23 MED ORDER — PEGFILGRASTIM 6 MG/0.6ML ~~LOC~~ PSKT
6.0000 mg | PREFILLED_SYRINGE | Freq: Once | SUBCUTANEOUS | Status: AC
  Administered 2016-08-23: 6 mg via SUBCUTANEOUS
  Filled 2016-08-23: qty 0.6

## 2016-08-23 MED ORDER — DOXORUBICIN HCL CHEMO IV INJECTION 2 MG/ML
60.0000 mg/m2 | Freq: Once | INTRAVENOUS | Status: AC
  Administered 2016-08-23: 102 mg via INTRAVENOUS
  Filled 2016-08-23: qty 51

## 2016-08-23 MED ORDER — SODIUM CHLORIDE 0.9 % IV SOLN
Freq: Once | INTRAVENOUS | Status: AC
  Administered 2016-08-23: 13:00:00 via INTRAVENOUS
  Filled 2016-08-23: qty 5

## 2016-08-23 MED ORDER — PALONOSETRON HCL INJECTION 0.25 MG/5ML
INTRAVENOUS | Status: AC
  Filled 2016-08-23: qty 5

## 2016-08-23 NOTE — Patient Instructions (Signed)
[facility] Discharge Instructions for Patients Receiving Chemotherapy  Today you received the following chemotherapy agents Adriamycin/Cytoxan.  To help prevent nausea and vomiting after your treatment, we encourage you to take your nausea medication as prescribed.    If you develop nausea and vomiting that is not controlled by your nausea medication, call the clinic.   BELOW ARE SYMPTOMS THAT SHOULD BE REPORTED IMMEDIATELY:  *FEVER GREATER THAN 100.5 F  *CHILLS WITH OR WITHOUT FEVER  NAUSEA AND VOMITING THAT IS NOT CONTROLLED WITH YOUR NAUSEA MEDICATION  *UNUSUAL SHORTNESS OF BREATH  *UNUSUAL BRUISING OR BLEEDING  TENDERNESS IN MOUTH AND THROAT WITH OR WITHOUT PRESENCE OF ULCERS  *URINARY PROBLEMS  *BOWEL PROBLEMS  UNUSUAL RASH Items with * indicate a potential emergency and should be followed up as soon as possible.  Feel free to call the clinic you have any questions or concerns. The clinic phone number is [phone].  Please show the CHEMO ALERT CARD at check-in to the Emergency Department and triage nurse.   

## 2016-08-23 NOTE — Patient Instructions (Signed)

## 2016-08-23 NOTE — Progress Notes (Signed)
[facility]  [phone] [phone]     ID: SAYDA GRABLE DOB: 1975-08-11  MR#: [id number]  GYK#:[id number]  Patient Care Team: Jonathon Jordan, MD as PCP - General (Family Medicine) Alphonsa Overall, MD as Consulting Physician (General Surgery) Magrinat, Virgie Dad, MD as Consulting Physician (Oncology) Eppie Gibson, MD as Attending Physician (Radiation Oncology) Osborne Oman, MD as Attending Physician (Obstetrics and Gynecology) Scot Dock, NP OTHER MD:  CHIEF COMPLAINT: estrogen receptor positive breast cancer  CURRENT TREATMENT: Adjuvant chemotherapy   BREAST CANCER HISTORY: From the original intake note:  [facility]ne [facility] [place] in December, at which time a mass in the right breast was noted. She was referred for BCCCP and screening mammography was obtained, showing a possible mass in the right breast.on 04/21/2016 she underwent right diagnostic mammography with tomography and ultrasonography at the breast Center, and this confirmedan irregular spiculat in the right breast measuring 1.8 cm, which was not palpable to the mammographer. Ultrasonography confirmed an irregular hypoechoic right breast mass at the 12:30 o'clock position 4 cm from the nipple measuring 2.2 cm. The right axilla was sonographically benign.  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  Her subsequent history is detailed below  INTERVAL HISTORY: Kloi is here today for evaluation prior to receiving her fourth cycle of adjuvant Doxorubicin and Cyclophosphamide.  She is very happy to be at this point of her treatment.  She is doing well.  She recently went to [place] to  visit family and enjoyed herself.  She continues to tolerate treatment very well.  She was evaluated by Dr. Haroldine Laws on 5/24 and her echo remained normal.  She was recommended 4 month f/u.   REVIEW OF SYSTEMS: Jalise does have some hyperpigmentation of the palms of her hands and some darkening on her nail beds, but otherwise denies any challenges.  She denies fevers, chills, nausea, vomiting, constipation (other than her usual), diarrhea, peripheral neuropathy, mucositis, or any further concerns.  A detailed ROS is otherwise non contributory.    PAST MEDICAL HISTORY: Past Medical History:  Diagnosis Date  . Breast cancer ([facility]) 05/2016   right  . Chronic constipation   . Family history of breast cancer   . Family history of ovarian cancer   . Family history of pancreatic cancer   . Family history of prostate cancer   . IBS (irritable bowel syndrome)     PAST SURGICAL HISTORY: Past Surgical History:  Procedure Laterality Date  . BARTHOLIN CYST MARSUPIALIZATION  09/04/2002  . BARTHOLIN GLAND CYST EXCISION Left 07/13/2005  . BREAST LUMPECTOMY WITH RADIOACTIVE SEED AND SENTINEL LYMPH NODE BIOPSY Right 06/07/2016   Procedure: BREAST LUMPECTOMY WITH RADIOACTIVE SEED AND SENTINEL LYMPH NODE BIOPSY;  Surgeon: Alphonsa Overall, MD;  Location: [facility];  Service: General;  Laterality: Right;  . HYSTEROSCOPY  2016  . MYOMECTOMY  07/20/2004  . OVARIAN CYST REMOVAL Right 07/20/2004  . PORTACATH PLACEMENT N/A 07/05/2016   Procedure: INSERTION PORT-A-CATH WITH [place];  Surgeon: Alphonsa Overall, MD;  Location: [facility];  Service: General;  Laterality: N/A;    FAMILY HISTORY Family History  Problem Relation Age of Onset  . Breast cancer Mother 70  . Pancreatic  cancer Paternal Grandmother   . Prostate cancer Paternal Grandfather   . Ovarian cancer Other        MGMs sister  . Breast cancer Other        mother's maternal first cousin  The patient's father, [name], lives in  [place] and works as a Geneticist, molecular. The patient's mother is a Quarry manager. The patient has no brother, one sister, who works in [place] as a Network engineer. The patient's mother had breast cancer, stage 0, diagnosed at age 65. There are 2 maternal relatives (mother's sister and mother's niece) with breast cancer, both postmenopausal.  GYNECOLOGIC HISTORY:  Menarche age 50, the patient is GX P0. She has regular periods, most recently mid-January. She is on oral contraceptives at present. No LMP recorded.   SOCIAL HISTORY:  The patient has a PhD degree and has worked for [employer] here and in [place] but is currently unemployed.  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 She generally lives in [place] by herself although she is currently staying at her parents.    ADVANCED DIRECTIVES: Not in place. The patient tells me she intends to name her sister as her healthcare power of attorney.   HEALTH MAINTENANCE: Social History  Substance Use Topics  . Smoking status: Never Smoker  . Smokeless tobacco: Never Used  . Alcohol use 0.6 oz/week    1 Glasses of wine per week     Comment: socially     Colonoscopy: July 2016, in [place]  PAP:  Bone density: Never   Allergies  Allergen Reactions  . Amoxicillin Rash    Current Outpatient Prescriptions  Medication Sig Dispense Refill  . Coenzyme Q10 (COQ10 PO) Take by mouth. LIQUID    . dexamethasone (DECADRON) 4 MG tablet Take 2 tablets by mouth once a day on the day after chemotherapy and then take 2 tablets two times a day for 2 days. Take with food. 30 tablet 1  . lidocaine-prilocaine (EMLA) cream Apply 1 application topically as needed. Apply over power port site about 30 to 60 minutes before chemotherapy. 30 g 0  . loratadine (CLARITIN) 10 MG tablet Take 1 tablet (10 mg total) by mouth daily. 30 tablet 1  . LORazepam (ATIVAN) 0.5 MG tablet Take 1 tablet (0.5 mg total) by mouth at bedtime as needed  (Nausea or vomiting). 30 tablet 0  . OVER THE COUNTER MEDICATION Take 7 tablets by mouth 2 (two) times daily. SUPER COLON CLEANSE    . prochlorperazine (COMPAZINE) 10 MG tablet Take 1 tablet (10 mg total) by mouth every 6 (six) hours as needed (Nausea or vomiting). 30 tablet 1  . tamoxifen (NOLVADEX) 20 MG tablet Take 1 tablet (20 mg total) by mouth daily. (Patient not taking: Reported on 08/23/2016) 30 tablet 4   Current Facility-Administered Medications  Medication Dose Route Frequency Provider Last Rate Last Dose  . sodium chloride 0.9 % injection 10 mL  10 mL Intravenous Once Magrinat, Virgie Dad, MD       Facility-Administered Medications Ordered in Other Visits  Medication Dose Route Frequency Provider Last Rate Last Dose  . 0.9 %  sodium chloride infusion   Intravenous Once Magrinat, Virgie Dad, MD      . cyclophosphamide (CYTOXAN) 1,020 mg in sodium chloride 0.9 % 250 mL chemo infusion  600 mg/m2 (Treatment Plan Recorded) Intravenous Once Magrinat, Virgie Dad, MD      . DOXOrubicin (ADRIAMYCIN) chemo injection 102 mg  60 mg/m2 (Treatment  Plan Recorded) Intravenous Once Magrinat, Virgie Dad, MD      . fosaprepitant (EMEND) 150 mg, dexamethasone (DECADRON) 12 mg in sodium chloride 0.9 % 145 mL IVPB   Intravenous Once Magrinat, Virgie Dad, MD      . heparin lock flush 100 unit/mL  500 Units Intracatheter Once PRN Magrinat, Virgie Dad, MD      . palonosetron (ALOXI) injection 0.25 mg  0.25 mg Intravenous Once Magrinat, Virgie Dad, MD      . pegfilgrastim (NEULASTA ONPRO KIT) injection 6 mg  6 mg Subcutaneous Once Magrinat, Virgie Dad, MD      . sodium chloride flush (NS) 0.9 % injection 10 mL  10 mL Intracatheter PRN Magrinat, Virgie Dad, MD        OBJECTIVE:  Vitals:   08/23/16 1207  BP: 109/62  Pulse: 64  Resp: 20  Temp: 97.8 F (36.6 C)     Body mass index is 23.5 kg/m.    ECOG FS:0 GENERAL: Patient is a well appearing female in no acute distress HEENT:  Sclerae anicteric.  PERRL.  Oropharynx  clear and moist. No ulcerations or evidence of oropharyngeal candidiasis. Neck is supple.  NODES:  No cervical, supraclavicular, or axillary lymphadenopathy palpated.  BREAST EXAM:  Deferred. LUNGS:  Clear to auscultation bilaterally.  No wheezes or rhonchi. HEART:  Regular rate and rhythm. No murmur appreciated. ABDOMEN:  Soft, nontender.  Positive, normoactive bowel sounds. No organomegaly palpated. MSK:  No focal spinal tenderness to palpation. Full range of motion bilaterally in the upper extremities. EXTREMITIES:  No peripheral edema.   SKIN:  Some hyperpigmented circular spots noted on her hands, and some darkening at her nail beds on her fingernails.  NEURO:  Nonfocal. Well oriented.  Appropriate affect.     LAB RESULTS:  CMP     Component Value Date/Time   NA 139 08/23/2016 1137   K 3.9 08/23/2016 1137   CO2 26 08/23/2016 1137   GLUCOSE 87 08/23/2016 1137   BUN 11.6 08/23/2016 1137   CREATININE 0.8 08/23/2016 1137   CALCIUM 9.3 08/23/2016 1137   PROT 6.4 08/23/2016 1137   ALBUMIN 3.9 08/23/2016 1137   AST 27 08/23/2016 1137   ALT 25 08/23/2016 1137   ALKPHOS 65 08/23/2016 1137   BILITOT 0.27 08/23/2016 1137    INo results found for: SPEP, UPEP  Lab Results  Component Value Date   WBC 5.8 08/23/2016   NEUTROABS 4.2 08/23/2016   HGB 12.2 08/23/2016   HCT 35.8 08/23/2016   MCV 95.5 08/23/2016   PLT 245 08/23/2016      Chemistry      Component Value Date/Time   NA 139 08/23/2016 1137   K 3.9 08/23/2016 1137   CO2 26 08/23/2016 1137   BUN 11.6 08/23/2016 1137   CREATININE 0.8 08/23/2016 1137      Component Value Date/Time   CALCIUM 9.3 08/23/2016 1137   ALKPHOS 65 08/23/2016 1137   AST 27 08/23/2016 1137   ALT 25 08/23/2016 1137   BILITOT 0.27 08/23/2016 1137       No results found for: LABCA2  No components found for: LABCA125  No results for input(s): INR in the last 168 hours.  Urinalysis No results found for: COLORURINE, APPEARANCEUR,  LABSPEC, PHURINE, GLUCOSEU, HGBUR, BILIRUBINUR, KETONESUR, PROTEINUR, UROBILINOGEN, NITRITE, LEUKOCYTESUR   STUDIES: No results found.  ELIGIBLE FOR AVAILABLE RESEARCH PROTOCOL: no  ASSESSMENT: 41 y.o. [place] woman currently residing in [place], status post right breast upper  inner quadrant biopsy 04/21/2016 for a clinical T2 N0, stage IIa invasive ductal carcinoma, grade 2, estrogen and progesterone receptor positive, HER-2 nonamplified, with an MIB-1 of 20%  (a) biopsy of 2 additional suspicious areas in the right breast 05/12/2016 was benign  (1) started tamoxifen 04/27/2016, Discontinued at the start of chemotherapy  (2) Oncotype DX obtained from the original biopsy showed a score of 14, predicting a 10 year risk of recurrence outside the breast of 9% if the patient's only systemic therapy is tamoxifen for 5 years. It also predicts no benefit from chemotherapy.   (3) patient is not interested in fertility preservation  (4) genetics testing 04/27/2016 through the [facility] gene panel offered by [facility] found no deleterious mutations in APC, ATM, BARD1, BMPR1A, BRCA1, BRCA2, BRIP1, CHD1, CDK4, CDKN2A, CHEK2, EPCAM (large rearrangement only), MLH1, MSH2, MSH6, MUTYH, NBN, PALB2, PMS2, PTEN, RAD51C, RAD51D, SMAD4, STK11, and TP53. Sequencing was performed for select regions of POLE and POLD1, and large rearrangement analysis was performed for select regions of GREM1.   (5) status post right lumpectomy with sentinel lymph node sampling 06/07/2016 for a pT1c pN1, stage IB invasive ductal carcinoma, grade 2, with negative margins   (6) Mammaprint sent from the final surgical sample was read as high risk, indicating a need for chemotherapy   (7) doxorubicin and cyclophosphamide in dose dense fashion 4 to started 07/12/2016 followed by paclitaxel weekly 12  (8) adjuvant radiation  to follow   (9) to resume tamoxifen at the completion of local  treatment  PLAN: Devory is doing well today.  Her CBC is normal, and I reviewed this with her in detail.  Her CMP is pending.  She will proceed with her fourth cycle of treatment today.  She will return in 1 week for labs and evaluation by Dr. Jana Hakim. She appears to be tolerating it very well.    She has a good understanding of the overall plan. She agrees with it. She knows to call for any problems that may develop before her next visit here.  A total of (20) minutes of face-to-face time was spent with this patient with greater than 50% of that time in counseling and care-coordination.   Scot Dock, NP   08/23/2016 12:42 PM Medical Oncology and Hematology [facility] [address] [place], [place] [address] Tel. [phone]    Fax. [phone]

## 2016-08-23 NOTE — Progress Notes (Signed)
QI encounter 

## 2016-08-25 ENCOUNTER — Telehealth: Payer: Self-pay | Admitting: *Deleted

## 2016-08-25 NOTE — Telephone Encounter (Signed)
"  I have a sore throat that started this morning.  Trouble with breakfast and lunch so I'm eating soft foods like  mashed potatoes.  I'm using biotene products. What else can I do?  It hurts right at the base of my throat in line with the neck.  Return number [phone] "  Denies fever but also denies having thermometer to check.  No red or white spots in mouth.  Instructed to obtain thermometer as soon as possible and call at any time if fever 100.5 or greater.  Take precautions avoiding people who are sick.    Nursing instructions are to continue biotene, rinse after eating, gargle every four to six hours with warm water mixed with baking soda and salt.  Continue soft foods, use a straw to drink.

## 2016-08-25 NOTE — Telephone Encounter (Signed)
Will follow up with her tomorrow. Thank you.

## 2016-08-26 ENCOUNTER — Telehealth: Payer: Self-pay | Admitting: *Deleted

## 2016-08-26 NOTE — Telephone Encounter (Signed)
Let me know if we need to fit her in today.  I would prefer her to come earlier in the day.

## 2016-08-26 NOTE — Telephone Encounter (Signed)
This RN spoke with pt this AM for follow up post call yesterday with Mirinda stating she feels overall improved today with symptoms.  No additional needs at this time.

## 2016-08-30 ENCOUNTER — Ambulatory Visit (HOSPITAL_BASED_OUTPATIENT_CLINIC_OR_DEPARTMENT_OTHER): Payer: Medicaid Other | Admitting: Oncology

## 2016-08-30 ENCOUNTER — Other Ambulatory Visit (HOSPITAL_BASED_OUTPATIENT_CLINIC_OR_DEPARTMENT_OTHER): Payer: Medicaid Other

## 2016-08-30 VITALS — BP 110/55 | HR 71 | Temp 98.3°F | Resp 18 | Ht 64.0 in | Wt 134.5 lb

## 2016-08-30 DIAGNOSIS — C50211 Malignant neoplasm of upper-inner quadrant of right female breast: Secondary | ICD-10-CM

## 2016-08-30 DIAGNOSIS — Z170 Estrogen receptor positive status [ER+]: Secondary | ICD-10-CM

## 2016-08-30 LAB — COMPREHENSIVE METABOLIC PANEL
ALT: 19 U/L (ref 0–55)
AST: 19 U/L (ref 5–34)
Albumin: 3.6 g/dL (ref 3.5–5.0)
Alkaline Phosphatase: 114 U/L (ref 40–150)
Anion Gap: 7 mEq/L (ref 3–11)
BUN: 9.9 mg/dL (ref 7.0–26.0)
CO2: 27 mEq/L (ref 22–29)
Calcium: 9.1 mg/dL (ref 8.4–10.4)
Chloride: 104 mEq/L (ref 98–109)
Creatinine: 0.8 mg/dL (ref 0.6–1.1)
EGFR: >90 mL/min/{1.73_m2} (ref >90)
Glucose: 104 mg/dl (ref 70–140)
Potassium: 4.2 mEq/L (ref 3.5–5.1)
Sodium: 139 mEq/L (ref 136–145)
Total Bilirubin: 0.26 mg/dL (ref 0.20–1.20)
Total Protein: 6.2 g/dL — ABNORMAL LOW (ref 6.4–8.3)

## 2016-08-30 LAB — CBC WITH DIFFERENTIAL/PLATELET
BASO%: 0.5 % (ref 0.0–2.0)
Basophils Absolute: 0 10*3/uL (ref 0.0–0.1)
EOS%: 0.5 % (ref 0.0–7.0)
Eosinophils Absolute: 0 10*3/uL (ref 0.0–0.5)
HCT: 31.1 % — ABNORMAL LOW (ref 34.8–46.6)
HGB: 10.7 g/dL — ABNORMAL LOW (ref 11.6–15.9)
LYMPH%: 30.6 % (ref 14.0–49.7)
MCH: 32.2 pg (ref 25.1–34.0)
MCHC: 34.4 g/dL (ref 31.5–36.0)
MCV: 93.7 fL (ref 79.5–101.0)
MONO#: 0.4 10*3/uL (ref 0.1–0.9)
MONO%: 10.1 % (ref 0.0–14.0)
NEUT#: 2.3 10*3/uL (ref 1.5–6.5)
NEUT%: 58.3 % (ref 38.4–76.8)
Platelets: 194 10*3/uL (ref 145–400)
RBC: 3.32 10*6/uL — ABNORMAL LOW (ref 3.70–5.45)
RDW: 15.9 % — ABNORMAL HIGH (ref 11.2–14.5)
WBC: 3.9 10*3/uL (ref 3.9–10.3)
lymph#: 1.2 10*3/uL (ref 0.9–3.3)

## 2016-08-30 NOTE — Progress Notes (Signed)
[place]  [phone] Fax:([phone]) [phone]     ID: BRIGHTYN MOZER DOB: 09-03-1975  MR#: [id number]  JOI#:[id number]  Patient Care Team: Jonathon Jordan, MD as PCP - General (Family Medicine) Alphonsa Overall, MD as Consulting Physician (General Surgery) Magrinat, Virgie Dad, MD as Consulting Physician (Oncology) Eppie Gibson, MD as Attending Physician (Radiation Oncology) Osborne Oman, MD as Attending Physician (Obstetrics and Gynecology) Chauncey Cruel, MD OTHER MD:  CHIEF COMPLAINT: estrogen receptor positive breast cancer  CURRENT TREATMENT: Adjuvant chemotherapy   BREAST CANCER HISTORY: From the original intake note:  Svetlana [facility] [place] in December, at which time a mass in the right breast was noted. She was referred for [facility]CP and screening mammography was obtained, showing a possible mass in the right breast.on 04/21/2016 she underwent right diagnostic mammography with tomography and ultrasonography at the breast Center, and this confirmedan irregular spiculat in the right breast measuring 1.8 cm, which was not palpable to the mammographer. Ultrasonography confirmed an irregular hypoechoic right breast mass at the 12:30 o'clock position 4 cm from the nipple measuring 2.2 cm. The right axilla was sonographically benign.  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  Her subsequent history is detailed below  INTERVAL HISTORY: Boone Master returns today for follow-up of her estrogen receptor positive breast cancer accompanied by her sister. Today is day 8 cycle 4 of 4 planned cycles of cyclophosphamide and doxorubicin. She will be starting paclitaxel  weekly week from today  REVIEW OF SYSTEMS: Thaysa did remarkably well with cycle 4. On day 2 she went to the gym. On day 3 she went to the gym again but had a little bit of swallowing difficulty. She did not have a sore throat, mouth sores, thrush, or fever. It was more like a globus sensation. That disappeared after 24 hours and has not recurred. She continues to have problems with constipation, but met with Lenise Arena and was very pleased with this approach. Once she is done with chemotherapy she may go back to him and consider some interventions for the constipation. A detailed review of systems today was otherwise stable  PAST MEDICAL HISTORY: Past Medical History:  Diagnosis Date  . Breast cancer ([facility]) 05/2016   right  . Chronic constipation   . Family history of breast cancer   . Family history of ovarian cancer   . Family history of pancreatic cancer   . Family history of prostate cancer   . IBS (irritable bowel syndrome)     PAST SURGICAL HISTORY: Past Surgical History:  Procedure Laterality Date  . BARTHOLIN CYST MARSUPIALIZATION  09/04/2002  . BARTHOLIN GLAND CYST EXCISION Left 07/13/2005  . BREAST LUMPECTOMY WITH RADIOACTIVE SEED AND SENTINEL LYMPH NODE BIOPSY Right 06/07/2016   Procedure: BREAST LUMPECTOMY WITH RADIOACTIVE SEED AND SENTINEL LYMPH NODE BIOPSY;  Surgeon: Alphonsa Overall, MD;  Location: [facility];  Service: General;  Laterality: Right;  . HYSTEROSCOPY  2016  . MYOMECTOMY  07/20/2004  . OVARIAN CYST REMOVAL Right 07/20/2004  . PORTACATH PLACEMENT N/A 07/05/2016   Procedure: INSERTION PORT-A-CATH WITH [place];  Surgeon: Alphonsa Overall, MD;  Location: [facility];  Service: General;  Laterality: N/A;    FAMILY HISTORY Family History  Problem Relation Age of Onset  . Breast cancer Mother 93  . Pancreatic cancer Paternal Grandmother   . Prostate cancer Paternal Grandfather   . Ovarian cancer Other        MGMs sister  . Breast cancer  Other        mother's maternal first cousin  The patient's father, [name], lives in [place] and works as a Geneticist, molecular. The patient's mother is a Quarry manager. The patient has no brother, one sister, who works in [place] as a Network engineer. The patient's mother had breast cancer, stage 0, diagnosed at age 53. There are 2 maternal relatives (mother's sister and mother's niece) with breast cancer, both postmenopausal.  GYNECOLOGIC HISTORY:  Menarche age 6, the patient is GX P0. She has regular periods, most recently mid-January. She is on oral contraceptives at present. No LMP recorded.   SOCIAL HISTORY:  The patient has a PhD degree and has worked for [employer] here and in [place] but is currently unemployed.  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 She generally lives in [place] by herself although she is currently staying at her parents.    ADVANCED DIRECTIVES: Not in place. The patient tells me she intends to name her sister as her healthcare power of attorney.   HEALTH MAINTENANCE: Social History  Substance Use Topics  . Smoking status: Never Smoker  . Smokeless tobacco: Never Used  . Alcohol use 0.6 oz/week    1 Glasses of wine per week     Comment: socially     Colonoscopy: July 2016, in [place]  PAP:  Bone density: Never   Allergies  Allergen Reactions  . Amoxicillin Rash    Current Outpatient Prescriptions  Medication Sig Dispense Refill  . Coenzyme Q10 (COQ10 PO) Take by mouth. LIQUID    . dexamethasone (DECADRON) 4 MG tablet Take 2 tablets by mouth once a day on the day after chemotherapy and then take 2 tablets two times a day for 2 days. Take with food. 30 tablet 1  . lidocaine-prilocaine (EMLA) cream Apply 1 application topically as needed. Apply over power port site about 30 to 60 minutes before chemotherapy. 30 g 0  . loratadine (CLARITIN) 10 MG tablet Take 1 tablet (10 mg total) by mouth daily. 30 tablet 1  . LORazepam  (ATIVAN) 0.5 MG tablet Take 1 tablet (0.5 mg total) by mouth at bedtime as needed (Nausea or vomiting). 30 tablet 0  . OVER THE COUNTER MEDICATION Take 7 tablets by mouth 2 (two) times daily. SUPER COLON CLEANSE    . prochlorperazine (COMPAZINE) 10 MG tablet Take 1 tablet (10 mg total) by mouth every 6 (six) hours as needed (Nausea or vomiting). 30 tablet 1  . tamoxifen (NOLVADEX) 20 MG tablet Take 1 tablet (20 mg total) by mouth daily. (Patient not taking: Reported on 08/23/2016) 30 tablet 4   Current Facility-Administered Medications  Medication Dose Route Frequency Provider Last Rate Last Dose  . sodium chloride 0.9 % injection 10 mL  10 mL Intravenous Once Magrinat, Virgie Dad, MD        OBJECTIVE:Young African-American woman Who appears well  Vitals:   08/30/16 1256  BP: (!) 110/55  Pulse: 71  Resp: 18  Temp: 98.3 F (36.8 C)     Body mass index is 23.09 kg/m.    ECOG FS:0  Sclerae unicteric, pupils round and equal Oropharynx clear and moist No cervical or supraclavicular adenopathy Lungs no rales or rhonchi  Heart regular rate and rhythm Abd soft, nontender, positive bowel sounds MSK no focal spinal tenderness, no upper extremity lymphedema Neuro: nonfocal, well oriented, appropriate affect Breasts: both axillae are benign  LAB RESULTS:  CMP     Component Value Date/Time   NA 139 08/23/2016 1137   K 3.9 08/23/2016 1137   CO2 26 08/23/2016 1137   GLUCOSE 87 08/23/2016 1137   BUN 11.6 08/23/2016 1137   CREATININE 0.8 08/23/2016 1137   CALCIUM 9.3 08/23/2016 1137   PROT 6.4 08/23/2016 1137   ALBUMIN 3.9 08/23/2016 1137   AST 27 08/23/2016 1137   ALT 25 08/23/2016 1137   ALKPHOS 65 08/23/2016 1137   BILITOT 0.27 08/23/2016 1137    INo results found for: SPEP, UPEP  Lab Results  Component Value Date   WBC 3.9 08/30/2016   NEUTROABS 2.3 08/30/2016   HGB 10.7 (L) 08/30/2016   HCT 31.1 (L) 08/30/2016   MCV 93.7 08/30/2016   PLT 194 08/30/2016      Chemistry        Component Value Date/Time   NA 139 08/23/2016 1137   K 3.9 08/23/2016 1137   CO2 26 08/23/2016 1137   BUN 11.6 08/23/2016 1137   CREATININE 0.8 08/23/2016 1137      Component Value Date/Time   CALCIUM 9.3 08/23/2016 1137   ALKPHOS 65 08/23/2016 1137   AST 27 08/23/2016 1137   ALT 25 08/23/2016 1137   BILITOT 0.27 08/23/2016 1137       No results found for: LABCA2  No components found for: LABCA125  No results for input(s): INR in the last 168 hours.  Urinalysis No results found for: COLORURINE, APPEARANCEUR, LABSPEC, PHURINE, GLUCOSEU, HGBUR, BILIRUBINUR, KETONESUR, PROTEINUR, UROBILINOGEN, NITRITE, LEUKOCYTESUR   STUDIES: No results found.  ELIGIBLE FOR AVAILABLE RESEARCH PROTOCOL: no  ASSESSMENT: 41 y.o. [place] woman currently residing in [place], status post right breast upper inner quadrant biopsy 04/21/2016 for a clinical T2 N0, stage IIa invasive ductal carcinoma, grade 2, estrogen and progesterone receptor positive, HER-2 nonamplified, with an MIB-1 of 20%  (a) biopsy of 2 additional suspicious areas in the right breast 05/12/2016 was benign  (1) started tamoxifen 04/27/2016, Discontinued at the start of chemotherapy  (2) Oncotype DX obtained from the original biopsy showed a score of 14, predicting a 10 year risk of recurrence outside the breast of 9% if the patient's only systemic therapy is tamoxifen for 5 years. It also predicts no benefit from chemotherapy.   (3) patient is not interested in fertility preservation  (4) genetics testing 04/27/2016 through the [facility] gene panel offered by [facility] found no deleterious mutations in APC, ATM, BARD1, BMPR1A, BRCA1, BRCA2, BRIP1, CHD1, CDK4, CDKN2A, CHEK2, EPCAM (large rearrangement only), MLH1, MSH2, MSH6, MUTYH, NBN, PALB2, PMS2, PTEN, RAD51C, RAD51D, SMAD4, STK11, and TP53. Sequencing was performed for select regions of POLE and POLD1, and large rearrangement analysis was  performed for select regions of GREM1.   (5) status post right lumpectomy with sentinel lymph node sampling 06/07/2016 for a pT1c pN1, stage IB invasive ductal carcinoma, grade 2, with negative margins   (6) Mammaprint sent from the final surgical sample was read as high risk, indicating a need for chemotherapy   (7) doxorubicin and cyclophosphamide in dose dense fashion 4 to started 07/12/2016, completed 08/23/2016 followed by paclitaxel weekly 12  (8) adjuvant radiation  to follow   (9) to resume tamoxifen at the completion of local treatment  PLAN: Sharell has completed  the more difficult part of her chemotherapy and has done remarkably well with it. Doubtless her uterus and the fact that she isn't fantastic physical shape largely accounts for that.  She is now ready to start the "easier" part of her treatment, which will be paclitaxel weekly 12. Today we reviewed the possible toxicities, side effects and complications of this agent with special emphasis on concerns regarding peripheral neuropathy. She understands if this develops it can be permanent. She is going to alert less if any numbness or tingling develops in her finger pad or toe pads at any point  We have simplified her medications. The only nausea medicine she will need if she needs that all his Compazine once on the evening of chemotherapy  We will start the Taxol next week. She will see me 2 weeks from now, with her second dose, to troubleshoot problems. If all goes well we will see her on an every other week basis will she completes her 12 weekly treatments  She has a good understanding of this plan. She agrees with it. She will call with any other issues that may develop before her return  Chauncey Cruel, MD   08/30/2016 1:17 PM Medical Oncology and Hematology [facility] [place], [place] [address] Tel. [phone]    Fax. [phone]

## 2016-08-30 NOTE — Progress Notes (Signed)
DISCONTINUE ON PATHWAY REGIMEN - Breast   Doxorubicin + Cyclophosphamide (AC):   A cycle is every 21 days:     Doxorubicin        Dose Mod: None     Cyclophosphamide        Dose Mod: None  **Always confirm dose/schedule in your pharmacy ordering system**    Paclitaxel 80 mg/m2 Weekly:   Administer weekly:     Paclitaxel        Dose Mod: None  **Always confirm dose/schedule in your pharmacy ordering system**    REASON: Other Reason PRIOR TREATMENT: [id number]: AC-T - [Doxorubicin + Cyclophosphamide q21 Days x 4 Cycles, Followed by Paclitaxel Weekly x 12 Weeks]  START ON PATHWAY REGIMEN - Breast   Doxorubicin + Cyclophosphamide (AC):   A cycle is every 21 days:     Doxorubicin      Cyclophosphamide   **Always confirm dose/schedule in your pharmacy ordering system**    Paclitaxel 80 mg/m2 Weekly:   Administer weekly:     Paclitaxel   **Always confirm dose/schedule in your pharmacy ordering system**    Patient Characteristics: Postoperative without Neoadjuvant Therapy (Pathologic Staging), Invasive Disease, Adjuvant Therapy, Node Positive (1-3), HER2 Negative/Unknown/Equivocal, ER Positive, MammaPrint(R) Ordered, High Genomic Risk Therapeutic Status: Postoperative without Neoadjuvant Therapy (Pathologic Staging) AJCC Grade: G2 AJCC N Category: pN1 AJCC M Category: cM0 ER Status: Positive (+) AJCC 8 Stage Grouping: IA HER2 Status: Negative (-) Oncotype Dx Recurrence Score: 14 AJCC T Category: pT1c PR Status: Positive (+) Has this patient completed genomic testing? Yes - MammaPrint(R) MammaPrint(R) Score: High Genomic Risk  Intent of Therapy: Curative Intent, Discussed with Patient

## 2016-08-30 NOTE — Progress Notes (Signed)
ON PATHWAY REGIMEN - Breast  No Change  Continue With Treatment as Ordered.   Doxorubicin + Cyclophosphamide [facility]):   A cycle is every 21 days:     Doxorubicin        Dose Mod: None     Cyclophosphamide        Dose Mod: None  **Always confirm dose/schedule in your pharmacy ordering system**    Paclitaxel 80 mg/m2 Weekly:   Administer weekly:     Paclitaxel        Dose Mod: None  **Always confirm dose/schedule in your pharmacy ordering system**    Patient Characteristics: Adjuvant Therapy, Node Negative, HER2/neu Negative/Unknown/Equivocal, ER Positive, Genomic Testing Not Performed, Chemotherapy Preferred AJCC Stage Grouping: IIA Current Disease Status: No Distant Mets or Local Recurrence AJCC M Stage: 0 ER Status: Positive (+) AJCC N Stage: 0 AJCC T Stage: 2 HER2/neu: Negative (-) PR Status: Positive (+) Node Status: Negative (-) Has this patient completed genomic testing? No - Did Not Order Test  Treatment Preferred: Chemotherapy  Intent of Therapy: Curative Intent, Discussed with Patient

## 2016-09-06 ENCOUNTER — Ambulatory Visit (HOSPITAL_BASED_OUTPATIENT_CLINIC_OR_DEPARTMENT_OTHER): Payer: Medicaid Other

## 2016-09-06 ENCOUNTER — Other Ambulatory Visit (HOSPITAL_BASED_OUTPATIENT_CLINIC_OR_DEPARTMENT_OTHER): Payer: Medicaid Other

## 2016-09-06 ENCOUNTER — Ambulatory Visit: Payer: Medicaid Other

## 2016-09-06 VITALS — BP 113/69 | HR 65 | Temp 98.7°F | Resp 16

## 2016-09-06 DIAGNOSIS — Z170 Estrogen receptor positive status [ER+]: Secondary | ICD-10-CM

## 2016-09-06 DIAGNOSIS — Z95828 Presence of other vascular implants and grafts: Secondary | ICD-10-CM | POA: Insufficient documentation

## 2016-09-06 DIAGNOSIS — C50211 Malignant neoplasm of upper-inner quadrant of right female breast: Secondary | ICD-10-CM

## 2016-09-06 DIAGNOSIS — Z5111 Encounter for antineoplastic chemotherapy: Secondary | ICD-10-CM | POA: Diagnosis not present

## 2016-09-06 LAB — COMPREHENSIVE METABOLIC PANEL
ALT: 33 U/L (ref 0–55)
AST: 33 U/L (ref 5–34)
Albumin: 3.6 g/dL (ref 3.5–5.0)
Alkaline Phosphatase: 69 U/L (ref 40–150)
Anion Gap: 8 mEq/L (ref 3–11)
BUN: 8 mg/dL (ref 7.0–26.0)
CO2: 27 mEq/L (ref 22–29)
Calcium: 9.2 mg/dL (ref 8.4–10.4)
Chloride: 108 mEq/L (ref 98–109)
Creatinine: 0.8 mg/dL (ref 0.6–1.1)
EGFR: >90 mL/min/{1.73_m2} (ref >90)
Glucose: 84 mg/dl (ref 70–140)
Potassium: 4.1 mEq/L (ref 3.5–5.1)
Sodium: 143 mEq/L (ref 136–145)
Total Bilirubin: 0.23 mg/dL (ref 0.20–1.20)
Total Protein: 6.2 g/dL — ABNORMAL LOW (ref 6.4–8.3)

## 2016-09-06 LAB — CBC WITH DIFFERENTIAL/PLATELET
BASO%: 0.5 % (ref 0.0–2.0)
Basophils Absolute: 0 10*3/uL (ref 0.0–0.1)
EOS%: 1.6 % (ref 0.0–7.0)
Eosinophils Absolute: 0.1 10*3/uL (ref 0.0–0.5)
HCT: 33.8 % — ABNORMAL LOW (ref 34.8–46.6)
HGB: 11.7 g/dL (ref 11.6–15.9)
LYMPH%: 19.8 % (ref 14.0–49.7)
MCH: 33.1 pg (ref 25.1–34.0)
MCHC: 34.5 g/dL (ref 31.5–36.0)
MCV: 95.8 fL (ref 79.5–101.0)
MONO#: 0.6 10*3/uL (ref 0.1–0.9)
MONO%: 13.5 % (ref 0.0–14.0)
NEUT#: 2.8 10*3/uL (ref 1.5–6.5)
NEUT%: 64.6 % (ref 38.4–76.8)
Platelets: 260 10*3/uL (ref 145–400)
RBC: 3.53 10*6/uL — ABNORMAL LOW (ref 3.70–5.45)
RDW: 18.2 % — ABNORMAL HIGH (ref 11.2–14.5)
WBC: 4.3 10*3/uL (ref 3.9–10.3)
lymph#: 0.9 10*3/uL (ref 0.9–3.3)

## 2016-09-06 MED ORDER — DIPHENHYDRAMINE HCL 50 MG/ML IJ SOLN
25.0000 mg | Freq: Once | INTRAMUSCULAR | Status: AC
  Administered 2016-09-06: 25 mg via INTRAVENOUS

## 2016-09-06 MED ORDER — DEXTROSE 5 % IV SOLN
80.0000 mg/m2 | Freq: Once | INTRAVENOUS | Status: AC
  Administered 2016-09-06: 132 mg via INTRAVENOUS
  Filled 2016-09-06: qty 22

## 2016-09-06 MED ORDER — SODIUM CHLORIDE 0.9% FLUSH
10.0000 mL | Freq: Once | INTRAVENOUS | Status: AC
  Administered 2016-09-06: 10 mL
  Filled 2016-09-06: qty 10

## 2016-09-06 MED ORDER — SODIUM CHLORIDE 0.9% FLUSH
10.0000 mL | INTRAVENOUS | Status: DC | PRN
  Administered 2016-09-06: 10 mL
  Filled 2016-09-06: qty 10

## 2016-09-06 MED ORDER — SODIUM CHLORIDE 0.9 % IV SOLN
20.0000 mg | Freq: Once | INTRAVENOUS | Status: AC
  Administered 2016-09-06: 20 mg via INTRAVENOUS
  Filled 2016-09-06: qty 2

## 2016-09-06 MED ORDER — FAMOTIDINE IN NACL 20-0.9 MG/50ML-% IV SOLN
20.0000 mg | Freq: Once | INTRAVENOUS | Status: AC
  Administered 2016-09-06: 20 mg via INTRAVENOUS

## 2016-09-06 MED ORDER — DIPHENHYDRAMINE HCL 50 MG/ML IJ SOLN
INTRAMUSCULAR | Status: AC
  Filled 2016-09-06: qty 1

## 2016-09-06 MED ORDER — SODIUM CHLORIDE 0.9 % IV SOLN
Freq: Once | INTRAVENOUS | Status: AC
  Administered 2016-09-06: 11:00:00 via INTRAVENOUS

## 2016-09-06 MED ORDER — FAMOTIDINE IN NACL 20-0.9 MG/50ML-% IV SOLN
INTRAVENOUS | Status: AC
  Filled 2016-09-06: qty 50

## 2016-09-06 MED ORDER — HEPARIN SOD (PORK) LOCK FLUSH 100 UNIT/ML IV SOLN
500.0000 [IU] | Freq: Once | INTRAVENOUS | Status: AC | PRN
  Administered 2016-09-06: 500 [IU]
  Filled 2016-09-06: qty 5

## 2016-09-06 NOTE — Patient Instructions (Signed)
[facility] Discharge Instructions for Patients Receiving Chemotherapy  Today you received the following chemotherapy agents:  Taxol  To help prevent nausea and vomiting after your treatment, we encourage you to take your nausea medication as prescribed.   If you develop nausea and vomiting that is not controlled by your nausea medication, call the clinic.   BELOW ARE SYMPTOMS THAT SHOULD BE REPORTED IMMEDIATELY:  *FEVER GREATER THAN 100.5 F  *CHILLS WITH OR WITHOUT FEVER  NAUSEA AND VOMITING THAT IS NOT CONTROLLED WITH YOUR NAUSEA MEDICATION  *UNUSUAL SHORTNESS OF BREATH  *UNUSUAL BRUISING OR BLEEDING  TENDERNESS IN MOUTH AND THROAT WITH OR WITHOUT PRESENCE OF ULCERS  *URINARY PROBLEMS  *BOWEL PROBLEMS  UNUSUAL RASH Items with * indicate a potential emergency and should be followed up as soon as possible.  Feel free to call the clinic you have any questions or concerns. The clinic phone number is [phone].  Please show the CHEMO ALERT CARD at check-in to the Emergency Department and triage nurse.     Paclitaxel injection What is this medicine? PACLITAXEL (PAK li TAX el) is a chemotherapy drug. It targets fast dividing cells, like cancer cells, and causes these cells to die. This medicine is used to treat ovarian cancer, breast cancer, and other cancers. This medicine may be used for other purposes; ask your health care provider or pharmacist if you have questions. COMMON BRAND NAME(S): Onxol, Taxol What should I tell my health care provider before I take this medicine? They need to know if you have any of these conditions: -blood disorders -irregular heartbeat -infection (especially a virus infection such as chickenpox, cold sores, or herpes) -liver disease -previous or ongoing radiation therapy -an unusual or allergic reaction to paclitaxel, alcohol, polyoxyethylated castor oil, other chemotherapy agents, other medicines, foods, dyes, or  preservatives -pregnant or trying to get pregnant -breast-feeding How should I use this medicine? This drug is given as an infusion into a vein. It is administered in a hospital or clinic by a specially trained health care professional. Talk to your pediatrician regarding the use of this medicine in children. Special care may be needed. Overdosage: If you think you have taken too much of this medicine contact a poison control center or emergency room at once. NOTE: This medicine is only for you. Do not share this medicine with others. What if I miss a dose? It is important not to miss your dose. Call your doctor or health care professional if you are unable to keep an appointment. What may interact with this medicine? Do not take this medicine with any of the following medications: -disulfiram -metronidazole This medicine may also interact with the following medications: -cyclosporine -diazepam -ketoconazole -medicines to increase blood counts like filgrastim, pegfilgrastim, sargramostim -other chemotherapy drugs like cisplatin, doxorubicin, epirubicin, etoposide, teniposide, vincristine -quinidine -testosterone -vaccines -verapamil Talk to your doctor or health care professional before taking any of these medicines: -acetaminophen -aspirin -ibuprofen -ketoprofen -naproxen This list may not describe all possible interactions. Give your health care provider a list of all the medicines, herbs, non-prescription drugs, or dietary supplements you use. Also tell them if you smoke, drink alcohol, or use illegal drugs. Some items may interact with your medicine. What should I watch for while using this medicine? Your condition will be monitored carefully while you are receiving this medicine. You will need important blood work done while you are taking this medicine. This medicine can cause serious allergic reactions. To reduce your risk you will   take other medicine(s) before  treatment with this medicine. If you experience allergic reactions like skin rash, itching or hives, swelling of the face, lips, or tongue, tell your doctor or health care professional right away. In some cases, you may be given additional medicines to help with side effects. Follow all directions for their use. This drug may make you feel generally unwell. This is not uncommon, as chemotherapy can affect healthy cells as well as cancer cells. Report any side effects. Continue your course of treatment even though you feel ill unless your doctor tells you to stop. Call your doctor or health care professional for advice if you get a fever, chills or sore throat, or other symptoms of a cold or flu. Do not treat yourself. This drug decreases your body's ability to fight infections. Try to avoid being around people who are sick. This medicine may increase your risk to bruise or bleed. Call your doctor or health care professional if you notice any unusual bleeding. Be careful brushing and flossing your teeth or using a toothpick because you may get an infection or bleed more easily. If you have any dental work done, tell your dentist you are receiving this medicine. Avoid taking products that contain aspirin, acetaminophen, ibuprofen, naproxen, or ketoprofen unless instructed by your doctor. These medicines may hide a fever. Do not become pregnant while taking this medicine. Women should inform their doctor if they wish to become pregnant or think they might be pregnant. There is a potential for serious side effects to an unborn child. Talk to your health care professional or pharmacist for more information. Do not breast-feed an infant while taking this medicine. Men are advised not to father a child while receiving this medicine. This product may contain alcohol. Ask your pharmacist or healthcare provider if this medicine contains alcohol. Be sure to tell all healthcare providers you are taking this medicine.  Certain medicines, like metronidazole and disulfiram, can cause an unpleasant reaction when taken with alcohol. The reaction includes flushing, headache, nausea, vomiting, sweating, and increased thirst. The reaction can last from 30 minutes to several hours. What side effects may I notice from receiving this medicine? Side effects that you should report to your doctor or health care professional as soon as possible: -allergic reactions like skin rash, itching or hives, swelling of the face, lips, or tongue -low blood counts - This drug may decrease the number of white blood cells, red blood cells and platelets. You may be at increased risk for infections and bleeding. -signs of infection - fever or chills, cough, sore throat, pain or difficulty passing urine -signs of decreased platelets or bleeding - bruising, pinpoint red spots on the skin, black, tarry stools, nosebleeds -signs of decreased red blood cells - unusually weak or tired, fainting spells, lightheadedness -breathing problems -chest pain -high or low blood pressure -mouth sores -nausea and vomiting -pain, swelling, redness or irritation at the injection site -pain, tingling, numbness in the hands or feet -slow or irregular heartbeat -swelling of the ankle, feet, hands Side effects that usually do not require medical attention (report to your doctor or health care professional if they continue or are bothersome): -bone pain -complete hair loss including hair on your head, underarms, pubic hair, eyebrows, and eyelashes -changes in the color of fingernails -diarrhea -loosening of the fingernails -loss of appetite -muscle or joint pain -red flush to skin -sweating This list may not describe all possible side effects. Call your doctor for medical advice about   side effects. You may report side effects to FDA at 1-800-FDA-1088. Where should I keep my medicine? This drug is given in a hospital or clinic and will not be stored at  home. NOTE: This sheet is a summary. It may not cover all possible information. If you have questions about this medicine, talk to your doctor, pharmacist, or health care provider.  2018 Elsevier/Gold Standard (2015-01-13 19:58:00)

## 2016-09-07 ENCOUNTER — Telehealth: Payer: Self-pay

## 2016-09-07 NOTE — Telephone Encounter (Signed)
2nd chemo f/u call placed.  No answer

## 2016-09-07 NOTE — Telephone Encounter (Signed)
Chemo f/u.  LVM for pt

## 2016-09-08 NOTE — Telephone Encounter (Signed)
No nausea, no fatigue, on day of chemo a little tired day of chemo but was not sure if it was d/t chemo or up late with family the night before. Went to gym yesterday. Discussed cleanliness and hand hygeine. Is eating and drinking well.

## 2016-09-13 ENCOUNTER — Ambulatory Visit (HOSPITAL_BASED_OUTPATIENT_CLINIC_OR_DEPARTMENT_OTHER): Payer: Medicaid Other

## 2016-09-13 ENCOUNTER — Encounter: Payer: Self-pay | Admitting: *Deleted

## 2016-09-13 ENCOUNTER — Telehealth: Payer: Self-pay | Admitting: *Deleted

## 2016-09-13 ENCOUNTER — Ambulatory Visit: Payer: Medicaid Other

## 2016-09-13 ENCOUNTER — Ambulatory Visit (HOSPITAL_BASED_OUTPATIENT_CLINIC_OR_DEPARTMENT_OTHER): Payer: Medicaid Other | Admitting: Oncology

## 2016-09-13 ENCOUNTER — Other Ambulatory Visit (HOSPITAL_BASED_OUTPATIENT_CLINIC_OR_DEPARTMENT_OTHER): Payer: Medicaid Other

## 2016-09-13 VITALS — BP 104/39 | HR 74 | Temp 97.7°F | Resp 18 | Ht 64.0 in | Wt 140.0 lb

## 2016-09-13 DIAGNOSIS — Z170 Estrogen receptor positive status [ER+]: Secondary | ICD-10-CM

## 2016-09-13 DIAGNOSIS — C50211 Malignant neoplasm of upper-inner quadrant of right female breast: Secondary | ICD-10-CM

## 2016-09-13 DIAGNOSIS — Z5111 Encounter for antineoplastic chemotherapy: Secondary | ICD-10-CM

## 2016-09-13 DIAGNOSIS — Z95828 Presence of other vascular implants and grafts: Secondary | ICD-10-CM

## 2016-09-13 LAB — CBC WITH DIFFERENTIAL/PLATELET
BASO%: 0.8 % (ref 0.0–2.0)
Basophils Absolute: 0 10*3/uL (ref 0.0–0.1)
EOS%: 2.4 % (ref 0.0–7.0)
Eosinophils Absolute: 0.1 10*3/uL (ref 0.0–0.5)
HCT: 31.5 % — ABNORMAL LOW (ref 34.8–46.6)
HGB: 10.8 g/dL — ABNORMAL LOW (ref 11.6–15.9)
LYMPH%: 33.3 % (ref 14.0–49.7)
MCH: 32.2 pg (ref 25.1–34.0)
MCHC: 34.3 g/dL (ref 31.5–36.0)
MCV: 94 fL (ref 79.5–101.0)
MONO#: 0.3 10*3/uL (ref 0.1–0.9)
MONO%: 10.6 % (ref 0.0–14.0)
NEUT#: 1.3 10*3/uL — ABNORMAL LOW (ref 1.5–6.5)
NEUT%: 52.9 % (ref 38.4–76.8)
Platelets: 340 10*3/uL (ref 145–400)
RBC: 3.35 10*6/uL — ABNORMAL LOW (ref 3.70–5.45)
RDW: 16.9 % — ABNORMAL HIGH (ref 11.2–14.5)
WBC: 2.5 10*3/uL — ABNORMAL LOW (ref 3.9–10.3)
lymph#: 0.8 10*3/uL — ABNORMAL LOW (ref 0.9–3.3)
nRBC: 0 % (ref 0–0)

## 2016-09-13 LAB — COMPREHENSIVE METABOLIC PANEL
ALT: 32 U/L (ref 0–55)
AST: 31 U/L (ref 5–34)
Albumin: 3.7 g/dL (ref 3.5–5.0)
Alkaline Phosphatase: 60 U/L (ref 40–150)
Anion Gap: 7 mEq/L (ref 3–11)
BUN: 7.4 mg/dL (ref 7.0–26.0)
CO2: 25 mEq/L (ref 22–29)
Calcium: 9 mg/dL (ref 8.4–10.4)
Chloride: 111 mEq/L — ABNORMAL HIGH (ref 98–109)
Creatinine: 0.7 mg/dL (ref 0.6–1.1)
EGFR: >90 mL/min/{1.73_m2} (ref >90)
Glucose: 86 mg/dl (ref 70–140)
Potassium: 4.1 mEq/L (ref 3.5–5.1)
Sodium: 143 mEq/L (ref 136–145)
Total Bilirubin: 0.39 mg/dL (ref 0.20–1.20)
Total Protein: 6.2 g/dL — ABNORMAL LOW (ref 6.4–8.3)

## 2016-09-13 MED ORDER — DEXAMETHASONE SODIUM PHOSPHATE 10 MG/ML IJ SOLN
10.0000 mg | Freq: Once | INTRAMUSCULAR | Status: AC
  Administered 2016-09-13: 10 mg via INTRAVENOUS

## 2016-09-13 MED ORDER — SODIUM CHLORIDE 0.9% FLUSH
10.0000 mL | Freq: Once | INTRAVENOUS | Status: AC
  Administered 2016-09-13: 10 mL
  Filled 2016-09-13: qty 10

## 2016-09-13 MED ORDER — ACETAMINOPHEN 325 MG PO TABS
650.0000 mg | ORAL_TABLET | Freq: Once | ORAL | Status: AC
  Administered 2016-09-13: 650 mg via ORAL

## 2016-09-13 MED ORDER — DEXAMETHASONE SODIUM PHOSPHATE 10 MG/ML IJ SOLN
INTRAMUSCULAR | Status: AC
Start: 2016-09-13 — End: 2016-09-13
  Filled 2016-09-13: qty 1

## 2016-09-13 MED ORDER — DIPHENHYDRAMINE HCL 50 MG/ML IJ SOLN
INTRAMUSCULAR | Status: AC
  Filled 2016-09-13: qty 1

## 2016-09-13 MED ORDER — FAMOTIDINE IN NACL 20-0.9 MG/50ML-% IV SOLN
INTRAVENOUS | Status: AC
  Filled 2016-09-13: qty 50

## 2016-09-13 MED ORDER — DIPHENHYDRAMINE HCL 50 MG/ML IJ SOLN
25.0000 mg | Freq: Once | INTRAMUSCULAR | Status: AC
  Administered 2016-09-13: 25 mg via INTRAVENOUS

## 2016-09-13 MED ORDER — SODIUM CHLORIDE 0.9 % IV SOLN: 10.0000 mg | Freq: Once | INTRAVENOUS | Status: DC

## 2016-09-13 MED ORDER — SODIUM CHLORIDE 0.9 % IV SOLN
Freq: Once | INTRAVENOUS | Status: AC
  Administered 2016-09-13: 12:00:00 via INTRAVENOUS

## 2016-09-13 MED ORDER — ACETAMINOPHEN 325 MG PO TABS
ORAL_TABLET | ORAL | Status: AC
  Filled 2016-09-13: qty 2

## 2016-09-13 MED ORDER — PACLITAXEL CHEMO INJECTION 300 MG/50ML
80.0000 mg/m2 | Freq: Once | INTRAVENOUS | Status: AC
  Administered 2016-09-13: 132 mg via INTRAVENOUS
  Filled 2016-09-13: qty 22

## 2016-09-13 MED ORDER — SODIUM CHLORIDE 0.9% FLUSH
10.0000 mL | INTRAVENOUS | Status: DC | PRN
  Administered 2016-09-13: 10 mL
  Filled 2016-09-13: qty 10

## 2016-09-13 MED ORDER — FAMOTIDINE IN NACL 20-0.9 MG/50ML-% IV SOLN
20.0000 mg | Freq: Once | INTRAVENOUS | Status: AC
  Administered 2016-09-13: 20 mg via INTRAVENOUS

## 2016-09-13 MED ORDER — HEPARIN SOD (PORK) LOCK FLUSH 100 UNIT/ML IV SOLN
500.0000 [IU] | Freq: Once | INTRAVENOUS | Status: AC | PRN
  Administered 2016-09-13: 500 [IU]
  Filled 2016-09-13: qty 5

## 2016-09-13 NOTE — Progress Notes (Signed)
Pt complains of period cramping and would like to take some tylenol if she could. Ordered 650mg  of tylenol for pain and notified Dr.Magrinat.

## 2016-09-13 NOTE — Patient Instructions (Signed)
[facility] Discharge Instructions for Patients Receiving Chemotherapy  Today you received the following chemotherapy agents Taxol   To help prevent nausea and vomiting after your treatment, we encourage you to take your nausea medication as directed.   If you develop nausea and vomiting that is not controlled by your nausea medication, call the clinic.   BELOW ARE SYMPTOMS THAT SHOULD BE REPORTED IMMEDIATELY:  *FEVER GREATER THAN 100.5 F  *CHILLS WITH OR WITHOUT FEVER  NAUSEA AND VOMITING THAT IS NOT CONTROLLED WITH YOUR NAUSEA MEDICATION  *UNUSUAL SHORTNESS OF BREATH  *UNUSUAL BRUISING OR BLEEDING  TENDERNESS IN MOUTH AND THROAT WITH OR WITHOUT PRESENCE OF ULCERS  *URINARY PROBLEMS  *BOWEL PROBLEMS  UNUSUAL RASH Items with * indicate a potential emergency and should be followed up as soon as possible.  Feel free to call the clinic you have any questions or concerns. The clinic phone number is [phone].  Please show the CHEMO ALERT CARD at check-in to the Emergency Department and triage nurse.   

## 2016-09-13 NOTE — Patient Instructions (Signed)

## 2016-09-13 NOTE — Progress Notes (Signed)
[place]  [phone] [phone]     ID: [name] DOB: [birth year]  MR#: [id number]  JSE#:[id number]  Patient Care Team: Jonathon Jordan, MD as PCP - General (Family Medicine) Alphonsa Overall, MD as Consulting Physician (General Surgery) Schylar Allard, Virgie Dad, MD as Consulting Physician (Oncology) Eppie Gibson, MD as Attending Physician (Radiation Oncology) Osborne Oman, MD as Attending Physician (Obstetrics and Gynecology) Cheryll Cockayne, MD as Referring Physician (Surgical Oncology) Chauncey Cruel, MD OTHER MD:  CHIEF COMPLAINT: estrogen receptor positive breast cancer  CURRENT TREATMENT: Adjuvant chemotherapy   BREAST CANCER HISTORY: From the original intake note:  Candice [name] [facility] [place] in December, at which time a mass in the right breast was noted. She was referred for BCCCP and screening mammography was obtained, showing a possible mass in the right breast.on 04/21/2016 she underwent right diagnostic mammography with tomography and ultrasonography at the breast Center, and this confirmedan irregular spiculat in the right breast measuring 1.8 cm, which was not palpable to the mammographer. Ultrasonography confirmed an irregular hypoechoic right breast mass at the 12:30 o'clock position 4 cm from the nipple measuring 2.2 cm. The right axilla was sonographically benign.  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  Her subsequent history is detailed below  INTERVAL HISTORY: Candice [name] returns today for follow-up and treatment of her estrogen receptor positive breast cancer accompanied by her sister. Today is day 1 cycle 2 of  paclitaxel which she is receiving weekly for a total of 12 doses.  [name] did well with cycle 1, with no nausea or fatigue. She went back to the gym the second day. She took Compazine once the next morning after treatment. She has had no peripheral neuropathy symptoms.  REVIEW OF SYSTEMS: Overall she is doing "good". A detailed review of systems today was noncontributory  PAST MEDICAL HISTORY: Past Medical History:  Diagnosis Date  . Breast cancer ([place]) 05/2016   right  . Chronic constipation   . Family history of breast cancer   . Family history of ovarian cancer   . Family history of pancreatic cancer   . Family history of prostate cancer   . IBS (irritable bowel syndrome)     PAST SURGICAL HISTORY: Past Surgical History:  Procedure Laterality Date  . BARTHOLIN CYST MARSUPIALIZATION  09/04/2002  . BARTHOLIN GLAND CYST EXCISION Left 07/13/2005  . BREAST LUMPECTOMY WITH RADIOACTIVE SEED AND SENTINEL LYMPH NODE BIOPSY Right 06/07/2016   Procedure: BREAST LUMPECTOMY WITH RADIOACTIVE SEED AND SENTINEL LYMPH NODE BIOPSY;  Surgeon: Alphonsa Overall, MD;  Location: [facility];  Service: General;  Laterality: Right;  . HYSTEROSCOPY  2016  . MYOMECTOMY  07/20/2004  . OVARIAN CYST REMOVAL Right 07/20/2004  . PORTACATH PLACEMENT N/A 07/05/2016   Procedure: INSERTION PORT-A-CATH WITH [place];  Surgeon: Alphonsa Overall, MD;  Location: [facility];  Service: General;  Laterality: N/A;    FAMILY HISTORY Family History  Problem Relation Age of Onset  . Breast cancer Mother 14  . Pancreatic cancer Paternal Grandmother   . Prostate cancer Paternal Grandfather   . Ovarian cancer Other        MGMs sister  . Breast cancer Other  mother's maternal first cousin  The patient's father, [name], lives in [place] and works as a Geneticist, molecular. The patient's mother is a Quarry manager. The patient has no brother, one sister, who works in [place] as a Network engineer. The patient's  mother had breast cancer, stage 0, diagnosed at age 46. There are 2 maternal relatives (mother's sister and mother's niece) with breast cancer, both postmenopausal.  GYNECOLOGIC HISTORY:  Menarche age 68, the patient is GX P0. She has regular periods, most recently mid-January. She is on oral contraceptives at present. No LMP recorded.   SOCIAL HISTORY:  The patient has a PhD degree and has worked for [employer] here and in [place] but is currently unemployed.  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 She generally lives in [place] by herself although she is currently staying at her parents.    ADVANCED DIRECTIVES: Not in place. The patient tells me she intends to name her sister as her healthcare power of attorney.   HEALTH MAINTENANCE: Social History  Substance Use Topics  . Smoking status: Never Smoker  . Smokeless tobacco: Never Used  . Alcohol use 0.6 oz/week    1 Glasses of wine per week     Comment: socially     Colonoscopy: July 2016, in [place]  PAP:  Bone density: Never   Allergies  Allergen Reactions  . Amoxicillin Rash    Current Outpatient Prescriptions  Medication Sig Dispense Refill  . Coenzyme Q10 (COQ10 PO) Take by mouth. LIQUID    . dexamethasone (DECADRON) 4 MG tablet Take 2 tablets by mouth once a day on the day after chemotherapy and then take 2 tablets two times a day for 2 days. Take with food. 30 tablet 1  . lidocaine-prilocaine (EMLA) cream Apply 1 application topically as needed. Apply over power port site about 30 to 60 minutes before chemotherapy. 30 g 0  . loratadine (CLARITIN) 10 MG tablet Take 1 tablet (10 mg total) by mouth daily. 30 tablet 1  . OVER THE COUNTER MEDICATION Take 7 tablets by mouth 2 (two) times daily. SUPER COLON CLEANSE    . prochlorperazine (COMPAZINE) 10 MG tablet Take 1 tablet (10 mg total) by mouth every 6 (six) hours as needed (Nausea or vomiting). 30 tablet 1  . tamoxifen (NOLVADEX) 20 MG  tablet Take 1 tablet (20 mg total) by mouth daily. (Patient not taking: Reported on 08/23/2016) 30 tablet 4   Current Facility-Administered Medications  Medication Dose Route Frequency Provider Last Rate Last Dose  . sodium chloride 0.9 % injection 10 mL  10 mL Intravenous Once Madisyn Mawhinney, Virgie Dad, MD        OBJECTIVE:Young African-American womanIn no acute distress  Vitals:   09/13/16 1038  BP: (!) 104/39  Pulse: 74  Resp: 18  Temp: 97.7 F (36.5 C)     Body mass index is 24.03 kg/m.    ECOG FS:0  Sclerae unicteric, EOMs intact Oropharynx clear and moist No cervical or supraclavicular adenopathy Lungs no rales or rhonchi Heart regular rate and rhythm Abd soft, nontender, positive bowel sounds MSK no focal spinal tenderness, no upper extremity lymphedema Neuro: nonfocal, well oriented, appropriate affect Breasts: Deferred  LAB RESULTS:  CMP     Component Value Date/Time   NA 143 09/13/2016 0941   K 4.1 09/13/2016 0941   CO2 25 09/13/2016 0941   GLUCOSE 86 09/13/2016 0941   BUN 7.4 09/13/2016 0941   CREATININE 0.7 09/13/2016 0941  CALCIUM 9.0 09/13/2016 0941   PROT 6.2 (L) 09/13/2016 0941   ALBUMIN 3.7 09/13/2016 0941   AST 31 09/13/2016 0941   ALT 32 09/13/2016 0941   ALKPHOS 60 09/13/2016 0941   BILITOT 0.39 09/13/2016 0941    INo results found for: SPEP, UPEP  Lab Results  Component Value Date   WBC 2.5 (L) 09/13/2016   NEUTROABS 1.3 (L) 09/13/2016   HGB 10.8 (L) 09/13/2016   HCT 31.5 (L) 09/13/2016   MCV 94.0 09/13/2016   PLT 340 09/13/2016      Chemistry      Component Value Date/Time   NA 143 09/13/2016 0941   K 4.1 09/13/2016 0941   CO2 25 09/13/2016 0941   BUN 7.4 09/13/2016 0941   CREATININE 0.7 09/13/2016 0941      Component Value Date/Time   CALCIUM 9.0 09/13/2016 0941   ALKPHOS 60 09/13/2016 0941   AST 31 09/13/2016 0941   ALT 32 09/13/2016 0941   BILITOT 0.39 09/13/2016 0941       No results found for: LABCA2  No components  found for: [id number]  No results for input(s): INR in the last 168 hours.  Urinalysis No results found for: COLORURINE, APPEARANCEUR, LABSPEC, PHURINE, GLUCOSEU, HGBUR, BILIRUBINUR, KETONESUR, PROTEINUR, UROBILINOGEN, NITRITE, LEUKOCYTESUR   STUDIES: No results found.  ELIGIBLE FOR AVAILABLE RESEARCH PROTOCOL: no  ASSESSMENT: 41 y.o. Candice [name] woman currently residing in [place], status post right breast upper inner quadrant biopsy 04/21/2016 for a clinical T2 N0, stage IIa invasive ductal carcinoma, grade 2, estrogen and progesterone receptor positive, HER-2 nonamplified, with an MIB-1 of 20%  (a) biopsy of 2 additional suspicious areas in the right breast 05/12/2016 was benign  (1) started tamoxifen 04/27/2016, Discontinued at the start of chemotherapy  (2) Oncotype DX obtained from the original biopsy showed a score of 14, predicting a 10 year risk of recurrence outside the breast of 9% if the patient's only systemic therapy is tamoxifen for 5 years. It also predicts no benefit from chemotherapy.   (3) patient is not interested in fertility preservation  (4) genetics testing 04/27/2016 through the [facility] gene panel offered by [facility] found no deleterious mutations in APC, ATM, BARD1, BMPR1A, BRCA1, BRCA2, BRIP1, CHD1, CDK4, CDKN2A, CHEK2, EPCAM (large rearrangement only), MLH1, MSH2, MSH6, MUTYH, NBN, PALB2, PMS2, PTEN, RAD51C, RAD51D, SMAD4, STK11, and TP53. Sequencing was performed for select regions of POLE and POLD1, and large rearrangement analysis was performed for select regions of GREM1.   (5) status post right lumpectomy with sentinel lymph node sampling 06/07/2016 for a pT1c pN1, stage IB invasive ductal carcinoma, grade 2, with negative margins   (6) Mammaprint sent from the final surgical sample was read as high risk, indicating a need for chemotherapy   (7) doxorubicin and cyclophosphamide in dose dense fashion 4 to started 07/12/2016,  completed 08/23/2016 followed by paclitaxel weekly 12 started 09/06/2016  (8) adjuvant radiation  to follow   (9) to resume tamoxifen at the completion of local treatment  PLAN: Dorothee did remarkably well with her first dose of paclitaxel on this promises good tolerance for the remaining doses.  Of course to be concerned with this drug is peripheral neuropathy. I again went over this in detail with her so that at the first time of numbness or tingling on her finger pads or toe pads she will let us know.  I am not scheduling a visit next week but will see her every other week until she  completes her therapy after which she will go to radiation  She knows to call for any problems that may develop before her next visit.  Chauncey Cruel, MD   09/13/2016 8:27 PM Medical Oncology and Hematology [facility] [address] [place],  [address] Tel. [phone]    Fax. [phone]

## 2016-09-13 NOTE — Telephone Encounter (Signed)
Per MD ok to treat despite ANC of 1.3.  Above called to treatment room nurse.

## 2016-09-19 ENCOUNTER — Ambulatory Visit
Admission: RE | Admit: 2016-09-19 | Discharge: 2016-09-19 | Disposition: A | Discharge status: Home / Self Care | Payer: Medicaid Other | Source: Ambulatory Visit | Attending: Surgical Oncology | Admitting: Surgical Oncology

## 2016-09-19 ENCOUNTER — Other Ambulatory Visit: Payer: Self-pay | Admitting: Surgical Oncology

## 2016-09-19 DIAGNOSIS — K5909 Other constipation: Secondary | ICD-10-CM

## 2016-09-19 DIAGNOSIS — C50211 Malignant neoplasm of upper-inner quadrant of right female breast: Secondary | ICD-10-CM | POA: Diagnosis not present

## 2016-09-20 ENCOUNTER — Ambulatory Visit (HOSPITAL_BASED_OUTPATIENT_CLINIC_OR_DEPARTMENT_OTHER): Payer: Medicaid Other

## 2016-09-20 ENCOUNTER — Ambulatory Visit: Payer: Medicaid Other

## 2016-09-20 ENCOUNTER — Other Ambulatory Visit (HOSPITAL_BASED_OUTPATIENT_CLINIC_OR_DEPARTMENT_OTHER): Payer: Medicaid Other

## 2016-09-20 VITALS — BP 104/61 | HR 69 | Temp 98.2°F | Resp 16

## 2016-09-20 DIAGNOSIS — Z95828 Presence of other vascular implants and grafts: Secondary | ICD-10-CM

## 2016-09-20 DIAGNOSIS — Z452 Encounter for adjustment and management of vascular access device: Secondary | ICD-10-CM

## 2016-09-20 DIAGNOSIS — Z170 Estrogen receptor positive status [ER+]: Secondary | ICD-10-CM

## 2016-09-20 DIAGNOSIS — C50211 Malignant neoplasm of upper-inner quadrant of right female breast: Secondary | ICD-10-CM

## 2016-09-20 LAB — COMPREHENSIVE METABOLIC PANEL
ALT: 73 U/L — ABNORMAL HIGH (ref 0–55)
AST: 46 U/L — ABNORMAL HIGH (ref 5–34)
Albumin: 3.8 g/dL (ref 3.5–5.0)
Alkaline Phosphatase: 66 U/L (ref 40–150)
Anion Gap: 7 mEq/L (ref 3–11)
BUN: 7.7 mg/dL (ref 7.0–26.0)
CO2: 26 mEq/L (ref 22–29)
Calcium: 9.8 mg/dL (ref 8.4–10.4)
Chloride: 106 mEq/L (ref 98–109)
Creatinine: 0.8 mg/dL (ref 0.6–1.1)
EGFR: >90 mL/min/{1.73_m2} (ref >90)
Glucose: 111 mg/dl (ref 70–140)
Potassium: 3.7 mEq/L (ref 3.5–5.1)
Sodium: 140 mEq/L (ref 136–145)
Total Bilirubin: 0.39 mg/dL (ref 0.20–1.20)
Total Protein: 6.6 g/dL (ref 6.4–8.3)

## 2016-09-20 LAB — CBC WITH DIFFERENTIAL/PLATELET
BASO%: 1.2 % (ref 0.0–2.0)
Basophils Absolute: 0 10*3/uL (ref 0.0–0.1)
EOS%: 3.4 % (ref 0.0–7.0)
Eosinophils Absolute: 0.1 10*3/uL (ref 0.0–0.5)
HCT: 34.4 % — ABNORMAL LOW (ref 34.8–46.6)
HGB: 11.8 g/dL (ref 11.6–15.9)
LYMPH%: 26.2 % (ref 14.0–49.7)
MCH: 32.9 pg (ref 25.1–34.0)
MCHC: 34.2 g/dL (ref 31.5–36.0)
MCV: 96.2 fL (ref 79.5–101.0)
MONO#: 0.4 10*3/uL (ref 0.1–0.9)
MONO%: 13.2 % (ref 0.0–14.0)
NEUT#: 1.5 10*3/uL (ref 1.5–6.5)
NEUT%: 56 % (ref 38.4–76.8)
Platelets: 343 10*3/uL (ref 145–400)
RBC: 3.58 10*6/uL — ABNORMAL LOW (ref 3.70–5.45)
RDW: 18 % — ABNORMAL HIGH (ref 11.2–14.5)
WBC: 2.7 10*3/uL — ABNORMAL LOW (ref 3.9–10.3)
lymph#: 0.7 10*3/uL — ABNORMAL LOW (ref 0.9–3.3)

## 2016-09-20 MED ORDER — SODIUM CHLORIDE 0.9% FLUSH
10.0000 mL | INTRAVENOUS | Status: DC | PRN
  Administered 2016-09-20: 10 mL via INTRAVENOUS
  Filled 2016-09-20: qty 10

## 2016-09-20 MED ORDER — HEPARIN SOD (PORK) LOCK FLUSH 100 UNIT/ML IV SOLN
500.0000 [IU] | Freq: Once | INTRAVENOUS | Status: AC | PRN
Start: 2016-09-20 — End: 2016-09-20
  Administered 2016-09-20: 500 [IU]
  Filled 2016-09-20: qty 5

## 2016-09-20 MED ORDER — SODIUM CHLORIDE 0.9% FLUSH
10.0000 mL | INTRAVENOUS | Status: DC | PRN
  Administered 2016-09-20: 10 mL
  Filled 2016-09-20: qty 10

## 2016-09-20 NOTE — Progress Notes (Signed)
Pt stated that she is experiencing itching all over her body the @5  days after tx, on Sunday this past week. "it doesn't last long, @30  min for 24 hours the first time, now 2-3 days. " Pt stated she does nothing for it, it resolved on it's own. Notified Doctor, hospital. Per Dr Jana Hakim, no tx today.

## 2016-09-20 NOTE — Patient Instructions (Signed)

## 2016-09-20 NOTE — Patient Instructions (Addendum)
[facility] Discharge Instructions for Patients Receiving Chemotherapy  Today you received the following chemotherapy agents:  Taxol.  To help prevent nausea and vomiting after your treatment, we encourage you to take your nausea medication as directed.   If you develop nausea and vomiting that is not controlled by your nausea medication, call the clinic.   BELOW ARE SYMPTOMS THAT SHOULD BE REPORTED IMMEDIATELY:  *FEVER GREATER THAN 100.5 F  *CHILLS WITH OR WITHOUT FEVER  NAUSEA AND VOMITING THAT IS NOT CONTROLLED WITH YOUR NAUSEA MEDICATION  *UNUSUAL SHORTNESS OF BREATH  *UNUSUAL BRUISING OR BLEEDING  TENDERNESS IN MOUTH AND THROAT WITH OR WITHOUT PRESENCE OF ULCERS  *URINARY PROBLEMS  *BOWEL PROBLEMS  UNUSUAL RASH Items with * indicate a potential emergency and should be followed up as soon as possible.  Feel free to call the clinic you have any questions or concerns. The clinic phone number is [phone].  Please show the [facility] at check-in to the Emergency Department and triage nurse.    Managing Low Blood Counts During Cancer Treatment READ: Low White Blood Cell Count  Cancer treatments such as chemotherapy and radiation can sometimes cause a drop in the supply of blood cells in the body, including red blood cells, white blood cells, and platelets. These blood cells are produced in the body and are released into the blood to perform specific functions:  Red blood cells carry gases such as oxygen and carbon dioxide to and from your lungs.  White blood cells help protect you from infection.  Platelets help your body to form blood clots to prevent and control bleeding.  When cancer treatments cause a drop in blood cell counts, your body may not have enough cells to keep up its normal functions. Symptoms or problems that may result will vary depending on which type of blood cells the treatment is affecting. If your blood counts are low, you  can take steps to help manage any problems. How can low blood counts affect me? Low blood counts have various effects depending on the type of blood cells involved:  If you have a low number of red blood cells, you have a condition called anemia. This can cause symptoms such as: ? Feeling tired and weak. ? Feeling light-headed. ? Being short of breath.  If you have a low number of white blood cells, you may be at higher risk for infections.  If you have a low number of platelets, you may bleed more easily, or your body may have trouble stopping any bleeding. You may also have more bruising.  How to manage symptoms or prevent problems from a low blood count If you have a low blood count, you can take steps to manage symptoms or prevent problems that may develop. The steps to take will depend on which type of blood cell is low. Low red blood cells Take these steps to help manage the symptoms of anemia:  Go for a walk or do some light exercise each day.  Take short naps during the day.  Eat foods that contain a lot of iron and protein. These include leafy vegetables, meat and fish, beans, sweet potatoes, and dried fruit such as prunes, raisins, and apricots.  Ask for help with errands and with work that needs to be done around the house. It is important to save your energy.  Take vitamins or supplements-such as iron, vitamin H47, or folic acid-as told by your health care provider.  Practice relaxation  techniques, such as yoga or meditation.  Low white blood cells Take these steps to help prevent infections:  Wash your hands often with warm, soapy water.  Avoid crowds of people and any person who has the flu or a fever.  Take care when cleaning yourself after using the bathroom. Tell your health care provider if you have any rectal sores or bleeding.  Avoid dental work. Check your mouth each day for sores or signs of infection.  Do not share utensils.  Avoid contact with pet  waste. Wash your hands after handling pets.  If you get a scrape or cut, clean it thoroughly right away.  Avoid fresh plants or dried flowers.  Do not swim or wade in lakes, ponds, rivers, water parks, or hot tubs.  Follow food safety guidelines. Cook meat thoroughly and wash all raw fruits and vegetables.  You may be instructed to wear a mask when around others to protect yourself.  Low platelets Take these steps to help prevent or control bleeding and bruising:  Use an electric razor for shaving instead of a blade.  Use a soft toothbrush and be careful during oral care. Talk with your cancer care team about whether you should avoid flossing. If your mouth is bleeding, rinse it with ice water.  Avoid activities that could cause injury, such as contact sports.  Talk with your health care provider about using laxatives or stool softeners to avoid constipation.  Do not use medicines such as ibuprofen, aspirin, or naproxen unless your health care provider tells you to.  Limit alcohol use.  Monitor any bleeding closely. If you start bleeding, hold pressure on the area for 5 minutes to stop the bleeding. Bleeding that does not stop is considered an emergency.  What treatments can help increase a low blood count? If needed, your health care provider may recommend treatment for a low blood count. Treatment will depend on the type of blood cell that is low and the severity of your condition. Treatment options may include:  Taking medicines to help stimulate the growth of blood cells. This is an option for treating a low red blood cell count. Your health care provider may also recommend that you take iron, folic acid, or vitamin B12 supplements.  Making dietary changes. Including more iron and protein in your diet can help stimulate the growth of red blood cells.  Adjusting your current medicines to help raise blood counts.  Making changes to your treatment plan.  Having a blood  transfusion. This may be done if your blood count is very low.  Contact a health care provider if:  You feel extremely tired and weak.  You have more bruising or bleeding.  You feel ill or you develop a cough.  You have swelling or redness.  You have mouth sores or a sore throat.  You have painful urination or you have blood in your urine or stool.  You are thinking of taking any new supplements or vitamins or making dietary changes. Get help right away if:  You are short of breath, have chest pain, or feel dizzy.  You have a fever or chills.  You have abdominal pain or diarrhea.  You have bleeding that will not stop. Summary  Cancer treatments such as chemotherapy and radiation can sometimes cause a drop in the supply of blood cells in the body, including red blood cells, white blood cells, and platelets.  If you have a low blood count, you can take steps to  manage symptoms or prevent problems that may develop.  Depending on which type of blood cell is low, you may need to take steps to prevent infection, prevent bleeding, or manage symptoms that may develop.  If needed, your health care provider may recommend treatment for a low blood count. This information is not intended to replace advice given to you by your health care provider. Make sure you discuss any questions you have with your health care provider. Document Released: 11/07/2015 Document Revised: 11/07/2015 Document Reviewed: 11/07/2015 Elsevier Interactive Patient Education  [employer].

## 2016-09-23 ENCOUNTER — Ambulatory Visit
Admission: RE | Admit: 2016-09-23 | Discharge: 2016-09-23 | Disposition: A | Discharge status: Home / Self Care | Payer: Medicaid Other | Source: Ambulatory Visit | Attending: Surgical Oncology | Admitting: Surgical Oncology

## 2016-09-23 ENCOUNTER — Other Ambulatory Visit: Payer: Self-pay | Admitting: Surgical Oncology

## 2016-09-23 ENCOUNTER — Other Ambulatory Visit: Payer: Self-pay | Admitting: Oncology

## 2016-09-23 DIAGNOSIS — K5909 Other constipation: Secondary | ICD-10-CM

## 2016-09-23 MED ORDER — DOXYCYCLINE HYCLATE 100 MG PO TABS: 100.0000 mg | ORAL_TABLET | Freq: Two times a day (BID) | ORAL | 0 refills | Status: DC

## 2016-09-23 NOTE — Progress Notes (Signed)
DISCONTINUE OFF PATHWAY REGIMEN - Breast   Doxorubicin + Cyclophosphamide [facility]):   A cycle is every 21 days:     Doxorubicin      Cyclophosphamide   **Always confirm dose/schedule in your pharmacy ordering system**    Paclitaxel 80 mg/m2 Weekly:   Administer weekly:     Paclitaxel   **Always confirm dose/schedule in your pharmacy ordering system**    REASON: Toxicities / Adverse Event PRIOR TREATMENT: [id number]: AC-T - [Doxorubicin + Cyclophosphamide q21 Days x 4 Cycles, Followed by Paclitaxel Weekly x 12 Weeks] TREATMENT RESPONSE: Partial Response (PR)  START OFF PATHWAY REGIMEN - Breast   [id number]:Nab-Paclitaxel (Abraxane(R)) 100 mg/m2 D1,8,15 q28 days:   A cycle is every 28 days (3 weeks on and 1 week off):     Nab-paclitaxel (protein bound)   **Always confirm dose/schedule in your pharmacy ordering system**    Patient Characteristics: Postoperative without Neoadjuvant Therapy (Pathologic Staging), Invasive Disease, Adjuvant Therapy, Node Positive (1-3), HER2 Negative/Unknown/Equivocal, ER Positive, MammaPrint(R) Ordered, High Genomic Risk Therapeutic Status: Postoperative without Neoadjuvant Therapy (Pathologic Staging) AJCC Grade: G2 AJCC N Category: pN1 AJCC M Category: cM0 ER Status: Positive (+) AJCC 8 Stage Grouping: IA HER2 Status: Negative (-) Oncotype Dx Recurrence Score: 14 AJCC T Category: pT1c PR Status: Positive (+) Has this patient completed genomic testing? Yes - MammaPrint(R) MammaPrint(R) Score: High Genomic Risk Intent of Therapy: Curative Intent, Discussed with Patient

## 2016-09-23 NOTE — Progress Notes (Unsigned)
Candice [name] paged me to tell me that she was having some "bumps" around her "bottom", which she thinks may be related to her earlier Taxol treatments. This is likely related to the steroids premeds and I am changing her to Abraxane which requires no steroids. I'm also calling and doxycycline for her. She will be seen on July 3 prior to her first Abraxane treatment.

## 2016-09-23 NOTE — Progress Notes (Signed)
ON PATHWAY REGIMEN - Breast  No Change  Continue With Treatment as Ordered.   Doxorubicin + Cyclophosphamide [facility]):   A cycle is every 21 days:     Doxorubicin      Cyclophosphamide   **Always confirm dose/schedule in your pharmacy ordering system**    Paclitaxel 80 mg/m2 Weekly:   Administer weekly:     Paclitaxel   **Always confirm dose/schedule in your pharmacy ordering system**    Patient Characteristics: Postoperative without Neoadjuvant Therapy (Pathologic Staging), Invasive Disease, Adjuvant Therapy, Node Positive (1-3), HER2 Negative/Unknown/Equivocal, ER Positive, MammaPrint(R) Ordered, High Genomic Risk Therapeutic Status: Postoperative without Neoadjuvant Therapy (Pathologic Staging) AJCC Grade: G2 AJCC N Category: pN1 AJCC M Category: cM0 ER Status: Positive (+) AJCC 8 Stage Grouping: IA HER2 Status: Negative (-) Oncotype Dx Recurrence Score: 14 AJCC T Category: pT1c PR Status: Positive (+) Has this patient completed genomic testing? Yes - MammaPrint(R) MammaPrint(R) Score: High Genomic Risk Intent of Therapy: Curative Intent, Discussed with Patient

## 2016-09-26 NOTE — Progress Notes (Signed)
[facility]  [phone] [phone]     ID: Candice [name] DOB: April 14, 1975  MR#: [id number]  JYN#:[id number]  Patient Care Team: Jonathon Jordan, MD as PCP - General (Family Medicine) Alphonsa Overall, MD as Consulting Physician (General Surgery) Magrinat, Virgie Dad, MD as Consulting Physician (Oncology) Eppie Gibson, MD as Attending Physician (Radiation Oncology) Osborne Oman, MD as Attending Physician (Obstetrics and Gynecology) Cheryll Cockayne, MD as Referring Physician (Surgical Oncology) Scot Dock, NP OTHER MD:  CHIEF COMPLAINT: estrogen receptor positive breast cancer  CURRENT TREATMENT: Adjuvant chemotherapy   BREAST CANCER HISTORY: From the original intake note:  Candice [name] was [facility] the [facility] [place] in December, at which time a mass in the right breast was noted. She was referred for [facility]CP and screening mammography was obtained, showing a possible mass in the right breast.on 04/21/2016 she underwent right diagnostic mammography with tomography and ultrasonography at the breast Center, and this confirmedan irregular spiculat in the right breast measuring 1.8 cm, which was not palpable to the mammographer. Ultrasonography confirmed an irregular hypoechoic right breast mass at the 12:30 o'clock position 4 cm from the nipple measuring 2.2 cm. The right axilla was sonographically benign.  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  Her subsequent history is detailed below  INTERVAL HISTORY: [name] is here today for evlauation prior to receiving her first cycle of Abraxane.  She had a reaction to Paclitaxel and today, she will start Abraxane.  She  did get some gum swelling last week and was started on Clindamycin, then she developed some bumps on her backside and was started on Doxycycline a couple of days later.  Yesterday, she developed itching over her body.    REVIEW OF SYSTEMS: Brinklee is doing well and a detailed ROS was non contributory except what was noted above.    PAST MEDICAL HISTORY: Past Medical History:  Diagnosis Date  . Breast cancer ([facility]) 05/2016   right  . Chronic constipation   . Family history of breast cancer   . Family history of ovarian cancer   . Family history of pancreatic cancer   . Family history of prostate cancer   . IBS (irritable bowel syndrome)     PAST SURGICAL HISTORY: Past Surgical History:  Procedure Laterality Date  . BARTHOLIN CYST MARSUPIALIZATION  09/04/2002  . BARTHOLIN GLAND CYST EXCISION Left 07/13/2005  . BREAST LUMPECTOMY WITH RADIOACTIVE SEED AND SENTINEL LYMPH NODE BIOPSY Right 06/07/2016   Procedure: BREAST LUMPECTOMY WITH RADIOACTIVE SEED AND SENTINEL LYMPH NODE BIOPSY;  Surgeon: Alphonsa Overall, MD;  Location: [facility];  Service: General;  Laterality: Right;  . HYSTEROSCOPY  2016  . MYOMECTOMY  07/20/2004  . OVARIAN CYST REMOVAL Right 07/20/2004  . PORTACATH PLACEMENT N/A 07/05/2016   Procedure: INSERTION PORT-A-CATH WITH [place];  Surgeon: Alphonsa Overall, MD;  Location: [facility];  Service: General;  Laterality: N/A;    FAMILY HISTORY Family History  Problem Relation Age of Onset  . Breast cancer Mother 78  . Pancreatic cancer Paternal Grandmother   . Prostate cancer Paternal Grandfather   . Ovarian cancer Other        MGMs sister  . Breast cancer Other  mother's maternal first cousin  The patient's father, Candice [name], lives in [place] and works as a Geneticist, molecular. The patient's mother is a Quarry manager. The patient has no brother, one sister, who works in [place] as a Network engineer. The patient's mother had breast cancer, stage 0, diagnosed  at age 22. There are 2 maternal relatives (mother's sister and mother's niece) with breast cancer, both postmenopausal.  GYNECOLOGIC HISTORY:  Menarche age 12, the patient is GX P0. She has regular periods, most recently mid-January. She is on oral contraceptives at present. No LMP recorded.   SOCIAL HISTORY:  The patient has a PhD degree and has worked for [employer] here and in [place] but is currently unemployed.  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 She generally lives in [place] by herself although she is currently staying at her parents.    ADVANCED DIRECTIVES: Not in place. The patient tells me she intends to name her sister as her healthcare power of attorney.   HEALTH MAINTENANCE: Social History  Substance Use Topics  . Smoking status: Never Smoker  . Smokeless tobacco: Never Used  . Alcohol use 0.6 oz/week    1 Glasses of wine per week     Comment: socially     Colonoscopy: July 2016, in [place]  PAP:  Bone density: Never   Allergies  Allergen Reactions  . Amoxicillin Rash    Current Outpatient Prescriptions  Medication Sig Dispense Refill  . Coenzyme Q10 (COQ10 PO) Take by mouth. LIQUID    . dexamethasone (DECADRON) 4 MG tablet Take 2 tablets by mouth once a day on the day after chemotherapy and then take 2 tablets two times a day for 2 days. Take with food. 30 tablet 1  . doxycycline (VIBRA-TABS) 100 MG tablet Take 1 tablet (100 mg total) by mouth 2 (two) times daily. 14 tablet 0  . lidocaine-prilocaine (EMLA) cream Apply 1 application topically as needed. Apply over power port site about 30 to 60 minutes before chemotherapy. 30 g 0  . loratadine (CLARITIN) 10 MG tablet Take 1 tablet (10 mg total) by mouth daily. 30 tablet 1  . OVER THE COUNTER MEDICATION Take 7 tablets by mouth 2 (two) times daily. SUPER COLON CLEANSE    . prochlorperazine (COMPAZINE) 10 MG tablet Take 1 tablet (10 mg total) by mouth every 6 (six) hours as  needed (Nausea or vomiting). 30 tablet 1  . tamoxifen (NOLVADEX) 20 MG tablet Take 1 tablet (20 mg total) by mouth daily. (Patient not taking: Reported on 08/23/2016) 30 tablet 4   Current Facility-Administered Medications  Medication Dose Route Frequency Provider Last Rate Last Dose  . sodium chloride 0.9 % injection 10 mL  10 mL Intravenous Once Magrinat, Virgie Dad, MD        OBJECTIVE:  Vitals:   09/27/16 1248  BP: 111/67  Pulse: 66  Resp: 18  Temp: 97.7 F (36.5 C)     Body mass index is 24.03 kg/m.    ECOG FS:0 GENERAL: Patient is a well appearing female in no acute distress HEENT:  Sclerae anicteric.  Oropharynx clear and moist. No ulcerations or evidence of oropharyngeal candidiasis. Neck is supple.  NODES:  No cervical, supraclavicular, or axillary lymphadenopathy palpated.  BREAST EXAM:  Deferred. LUNGS:  Clear to auscultation bilaterally.  No wheezes or rhonchi. HEART:  Regular rate and rhythm. No murmur appreciated. ABDOMEN:  Soft, nontender.  Positive, normoactive bowel sounds. No organomegaly palpated. MSK:  No focal  spinal tenderness to palpation. Full range of motion bilaterally in the upper extremities. EXTREMITIES:  No peripheral edema.   SKIN:  Clear with no obvious rashes or skin changes. No nail dyscrasia. NEURO:  Nonfocal. Well oriented.  Appropriate affect.   LAB RESULTS:  CMP     Component Value Date/Time   NA 142 09/27/2016 1212   K 4.0 09/27/2016 1212   CO2 26 09/27/2016 1212   GLUCOSE 89 09/27/2016 1212   BUN 9.8 09/27/2016 1212   CREATININE 0.8 09/27/2016 1212   CALCIUM 9.3 09/27/2016 1212   PROT 6.4 09/27/2016 1212   ALBUMIN 3.7 09/27/2016 1212   AST 32 09/27/2016 1212   ALT 37 09/27/2016 1212   ALKPHOS 73 09/27/2016 1212   BILITOT 0.38 09/27/2016 1212    INo results found for: SPEP, UPEP  Lab Results  Component Value Date   WBC 3.4 (L) 09/27/2016   NEUTROABS 1.6 09/27/2016   HGB 11.8 09/27/2016   HCT 34.8 09/27/2016   MCV 96.6  09/27/2016   PLT 303 09/27/2016      Chemistry      Component Value Date/Time   NA 142 09/27/2016 1212   K 4.0 09/27/2016 1212   CO2 26 09/27/2016 1212   BUN 9.8 09/27/2016 1212   CREATININE 0.8 09/27/2016 1212      Component Value Date/Time   CALCIUM 9.3 09/27/2016 1212   ALKPHOS 73 09/27/2016 1212   AST 32 09/27/2016 1212   ALT 37 09/27/2016 1212   BILITOT 0.38 09/27/2016 1212       No results found for: LABCA2  No components found for: LABCA125  No results for input(s): INR in the last 168 hours.  Urinalysis No results found for: COLORURINE, APPEARANCEUR, LABSPEC, PHURINE, GLUCOSEU, HGBUR, BILIRUBINUR, KETONESUR, PROTEINUR, UROBILINOGEN, NITRITE, LEUKOCYTESUR   STUDIES: Dg Abd 1 View  Result Date: 09/23/2016 CLINICAL DATA:  Follow-up Sitz marker study EXAM: ABDOMEN - 1 VIEW COMPARISON:  09/19/2016 FINDINGS: Scattered large and small bowel gas is noted. Fecal material is noted throughout the colon. The stomach is distended with food stuffs. The previously seen Sitz markers have all passed. No bony abnormality is noted. IMPRESSION: Stable fecal load suspicious for constipation. The previously seen Sitz markers have passed in the interval from the prior exam. Electronically Signed   By: Inez Catalina M.D.   On: 09/23/2016 12:09   Dg Abd 1 View  Result Date: 09/19/2016 CLINICAL DATA:  Sitz markers taken yesterday / concern for motility / current hx of breast Ca and chemo / jdh 315 EXAM: ABDOMEN - 1 VIEW COMPARISON:  None. FINDINGS: Sitz markers scattered throughout the colon. Moderate amount of stool within the nondistended colon. No dilated large or small bowel loops. No evidence of soft tissue mass or abnormal fluid collection. No evidence of free intraperitoneal air. Osseous structures are unremarkable. IMPRESSION: 1. Scattered Sitz markers throughout the colon. 2. Moderate amount of stool throughout the colon (constipation? ). 3. Nonobstructive bowel gas pattern.  Electronically Signed   By: Franki Cabot M.D.   On: 09/19/2016 09:43    ELIGIBLE FOR AVAILABLE RESEARCH PROTOCOL: no  ASSESSMENT: 41 y.o. [place] woman currently residing in [place], status post right breast upper inner quadrant biopsy 04/21/2016 for a clinical T2 N0, stage IIa invasive ductal carcinoma, grade 2, estrogen and progesterone receptor positive, HER-2 nonamplified, with an MIB-1 of 20%  (a) biopsy of 2 additional suspicious areas in the right breast 05/12/2016 was benign  (1) started tamoxifen 04/27/2016, Discontinued  at the start of chemotherapy  (2) Oncotype DX obtained from the original biopsy showed a score of 14, predicting a 10 year risk of recurrence outside the breast of 9% if the patient's only systemic therapy is tamoxifen for 5 years. It also predicts no benefit from chemotherapy.   (3) patient is not interested in fertility preservation  (4) genetics testing 04/27/2016 through the [facility] gene panel offered by [facility] found no deleterious mutations in APC, ATM, BARD1, BMPR1A, BRCA1, BRCA2, BRIP1, CHD1, CDK4, CDKN2A, CHEK2, EPCAM (large rearrangement only), MLH1, MSH2, MSH6, MUTYH, NBN, PALB2, PMS2, PTEN, RAD51C, RAD51D, SMAD4, STK11, and TP53. Sequencing was performed for select regions of POLE and POLD1, and large rearrangement analysis was performed for select regions of GREM1.   (5) status post right lumpectomy with sentinel lymph node sampling 06/07/2016 for a pT1c pN1, stage IB invasive ductal carcinoma, grade 2, with negative margins   (6) Mammaprint sent from the final surgical sample was read as high risk, indicating a need for chemotherapy   (7) doxorubicin and cyclophosphamide in dose dense fashion 4 to started 07/12/2016, completed 08/23/2016 followed by paclitaxel weekly 12 started 09/06/2016 (received 2 cycles), changed to Abraxane on 09/27/2016 due to reaction to Paclitaxel  (8) adjuvant radiation  to follow   (9) to  resume tamoxifen at the completion of local treatment  PLAN: Phebe is doing well today.  I reviewed her labs.  She will proceed with chemotherapy.  Her gum issue and what sounds like folliculitis issue has resolved.  Since she developed itching after starting both of these antibiotics, we don't really know which one caused it.  Her issues have resolved, so she will stop the antibiotics for now and we will see how she does.    Myria will return in one week for her next cycle of Abraxane.  She knows to call for any problems that may develop before her next visit.  A total of (30) minutes of face-to-face time was spent with this patient with greater than 50% of that time in counseling and care-coordination.   Scot Dock, NP   09/28/2016 9:53 AM Medical Oncology and Hematology [facility] [address] [place], [place] [address] Tel. [phone]    Fax. [phone]

## 2016-09-27 ENCOUNTER — Ambulatory Visit (HOSPITAL_BASED_OUTPATIENT_CLINIC_OR_DEPARTMENT_OTHER): Payer: Medicaid Other | Admitting: Adult Health

## 2016-09-27 ENCOUNTER — Other Ambulatory Visit (HOSPITAL_BASED_OUTPATIENT_CLINIC_OR_DEPARTMENT_OTHER): Payer: Medicaid Other

## 2016-09-27 ENCOUNTER — Ambulatory Visit: Payer: Medicaid Other

## 2016-09-27 ENCOUNTER — Ambulatory Visit (HOSPITAL_BASED_OUTPATIENT_CLINIC_OR_DEPARTMENT_OTHER): Payer: Medicaid Other

## 2016-09-27 ENCOUNTER — Encounter: Payer: Self-pay | Admitting: *Deleted

## 2016-09-27 VITALS — BP 111/67 | HR 66 | Temp 97.7°F | Resp 18 | Ht 64.0 in | Wt 140.0 lb

## 2016-09-27 DIAGNOSIS — Z170 Estrogen receptor positive status [ER+]: Secondary | ICD-10-CM

## 2016-09-27 DIAGNOSIS — Z5111 Encounter for antineoplastic chemotherapy: Secondary | ICD-10-CM

## 2016-09-27 DIAGNOSIS — Z95828 Presence of other vascular implants and grafts: Secondary | ICD-10-CM

## 2016-09-27 DIAGNOSIS — C50211 Malignant neoplasm of upper-inner quadrant of right female breast: Secondary | ICD-10-CM

## 2016-09-27 LAB — CBC WITH DIFFERENTIAL/PLATELET
BASO%: 0.8 % (ref 0.0–2.0)
Basophils Absolute: 0 10*3/uL (ref 0.0–0.1)
EOS%: 2.9 % (ref 0.0–7.0)
Eosinophils Absolute: 0.1 10*3/uL (ref 0.0–0.5)
HCT: 34.8 % (ref 34.8–46.6)
HGB: 11.8 g/dL (ref 11.6–15.9)
LYMPH%: 34.2 % (ref 14.0–49.7)
MCH: 32.8 pg (ref 25.1–34.0)
MCHC: 34 g/dL (ref 31.5–36.0)
MCV: 96.6 fL (ref 79.5–101.0)
MONO#: 0.6 10*3/uL (ref 0.1–0.9)
MONO%: 16.3 % — ABNORMAL HIGH (ref 0.0–14.0)
NEUT#: 1.6 10*3/uL (ref 1.5–6.5)
NEUT%: 45.8 % (ref 38.4–76.8)
Platelets: 303 10*3/uL (ref 145–400)
RBC: 3.6 10*6/uL — ABNORMAL LOW (ref 3.70–5.45)
RDW: 17.3 % — ABNORMAL HIGH (ref 11.2–14.5)
WBC: 3.4 10*3/uL — ABNORMAL LOW (ref 3.9–10.3)
lymph#: 1.2 10*3/uL (ref 0.9–3.3)

## 2016-09-27 LAB — COMPREHENSIVE METABOLIC PANEL
ALT: 37 U/L (ref 0–55)
AST: 32 U/L (ref 5–34)
Albumin: 3.7 g/dL (ref 3.5–5.0)
Alkaline Phosphatase: 73 U/L (ref 40–150)
Anion Gap: 9 mEq/L (ref 3–11)
BUN: 9.8 mg/dL (ref 7.0–26.0)
CO2: 26 mEq/L (ref 22–29)
Calcium: 9.3 mg/dL (ref 8.4–10.4)
Chloride: 108 mEq/L (ref 98–109)
Creatinine: 0.8 mg/dL (ref 0.6–1.1)
EGFR: >90 mL/min/{1.73_m2} (ref >90)
Glucose: 89 mg/dl (ref 70–140)
Potassium: 4 mEq/L (ref 3.5–5.1)
Sodium: 142 mEq/L (ref 136–145)
Total Bilirubin: 0.38 mg/dL (ref 0.20–1.20)
Total Protein: 6.4 g/dL (ref 6.4–8.3)

## 2016-09-27 MED ORDER — HEPARIN SOD (PORK) LOCK FLUSH 100 UNIT/ML IV SOLN
500.0000 [IU] | Freq: Once | INTRAVENOUS | Status: AC | PRN
  Administered 2016-09-27: 500 [IU]
  Filled 2016-09-27: qty 5

## 2016-09-27 MED ORDER — PROCHLORPERAZINE MALEATE 10 MG PO TABS
10.0000 mg | ORAL_TABLET | Freq: Once | ORAL | Status: AC
  Administered 2016-09-27: 10 mg via ORAL

## 2016-09-27 MED ORDER — SODIUM CHLORIDE 0.9% FLUSH
10.0000 mL | Freq: Once | INTRAVENOUS | Status: AC
  Administered 2016-09-27: 10 mL
  Filled 2016-09-27: qty 10

## 2016-09-27 MED ORDER — PACLITAXEL PROTEIN-BOUND CHEMO INJECTION 100 MG
100.0000 mg/m2 | Freq: Once | INTRAVENOUS | Status: AC
  Administered 2016-09-27: 175 mg via INTRAVENOUS
  Filled 2016-09-27: qty 35

## 2016-09-27 MED ORDER — SODIUM CHLORIDE 0.9% FLUSH
10.0000 mL | INTRAVENOUS | Status: DC | PRN
  Administered 2016-09-27: 10 mL
  Filled 2016-09-27: qty 10

## 2016-09-27 MED ORDER — SODIUM CHLORIDE 0.9 % IV SOLN
Freq: Once | INTRAVENOUS | Status: AC
  Administered 2016-09-27: 15:00:00 via INTRAVENOUS

## 2016-09-27 MED ORDER — PROCHLORPERAZINE MALEATE 10 MG PO TABS
ORAL_TABLET | ORAL | Status: AC
  Filled 2016-09-27: qty 1

## 2016-09-27 NOTE — Patient Instructions (Signed)

## 2016-09-27 NOTE — Patient Instructions (Signed)
[facility] Discharge Instructions for Patients Receiving Chemotherapy  Today you received the following chemotherapy agents Abraxane  To help prevent nausea and vomiting after your treatment, we encourage you to take your nausea medication    If you develop nausea and vomiting that is not controlled by your nausea medication, call the clinic.   BELOW ARE SYMPTOMS THAT SHOULD BE REPORTED IMMEDIATELY:  *FEVER GREATER THAN 100.5 F  *CHILLS WITH OR WITHOUT FEVER  NAUSEA AND VOMITING THAT IS NOT CONTROLLED WITH YOUR NAUSEA MEDICATION  *UNUSUAL SHORTNESS OF BREATH  *UNUSUAL BRUISING OR BLEEDING  TENDERNESS IN MOUTH AND THROAT WITH OR WITHOUT PRESENCE OF ULCERS  *URINARY PROBLEMS  *BOWEL PROBLEMS  UNUSUAL RASH Items with * indicate a potential emergency and should be followed up as soon as possible.  Feel free to call the clinic you have any questions or concerns. The clinic phone number is [phone].  Please show the CHEMO ALERT CARD at check-in to the Emergency Department and triage nurse.   

## 2016-09-28 ENCOUNTER — Encounter: Payer: Self-pay | Admitting: Adult Health

## 2016-09-29 ENCOUNTER — Other Ambulatory Visit: Payer: Self-pay | Admitting: Oncology

## 2016-09-29 DIAGNOSIS — K5909 Other constipation: Secondary | ICD-10-CM | POA: Insufficient documentation

## 2016-10-04 ENCOUNTER — Ambulatory Visit (HOSPITAL_BASED_OUTPATIENT_CLINIC_OR_DEPARTMENT_OTHER): Payer: Medicaid Other | Admitting: Oncology

## 2016-10-04 ENCOUNTER — Ambulatory Visit (HOSPITAL_BASED_OUTPATIENT_CLINIC_OR_DEPARTMENT_OTHER): Payer: Medicaid Other

## 2016-10-04 ENCOUNTER — Other Ambulatory Visit (HOSPITAL_BASED_OUTPATIENT_CLINIC_OR_DEPARTMENT_OTHER): Payer: Medicaid Other

## 2016-10-04 ENCOUNTER — Ambulatory Visit: Payer: Medicaid Other

## 2016-10-04 VITALS — BP 128/63 | HR 75 | Temp 98.3°F | Resp 18

## 2016-10-04 DIAGNOSIS — Z170 Estrogen receptor positive status [ER+]: Secondary | ICD-10-CM

## 2016-10-04 DIAGNOSIS — Z452 Encounter for adjustment and management of vascular access device: Secondary | ICD-10-CM | POA: Diagnosis present

## 2016-10-04 DIAGNOSIS — C50211 Malignant neoplasm of upper-inner quadrant of right female breast: Secondary | ICD-10-CM | POA: Diagnosis present

## 2016-10-04 DIAGNOSIS — Z95828 Presence of other vascular implants and grafts: Secondary | ICD-10-CM

## 2016-10-04 LAB — COMPREHENSIVE METABOLIC PANEL
ALT: 39 U/L (ref 0–55)
AST: 33 U/L (ref 5–34)
Albumin: 3.8 g/dL (ref 3.5–5.0)
Alkaline Phosphatase: 76 U/L (ref 40–150)
Anion Gap: 9 mEq/L (ref 3–11)
BUN: 6.9 mg/dL — ABNORMAL LOW (ref 7.0–26.0)
CO2: 27 mEq/L (ref 22–29)
Calcium: 9.3 mg/dL (ref 8.4–10.4)
Chloride: 109 mEq/L (ref 98–109)
Creatinine: 0.9 mg/dL (ref 0.6–1.1)
EGFR: >90 mL/min/{1.73_m2} (ref >90)
Glucose: 82 mg/dl (ref 70–140)
Potassium: 4.3 mEq/L (ref 3.5–5.1)
Sodium: 145 mEq/L (ref 136–145)
Total Bilirubin: 0.45 mg/dL (ref 0.20–1.20)
Total Protein: 6.4 g/dL (ref 6.4–8.3)

## 2016-10-04 LAB — CBC WITH DIFFERENTIAL/PLATELET
BASO%: 1.2 % (ref 0.0–2.0)
Basophils Absolute: 0 10*3/uL (ref 0.0–0.1)
EOS%: 5.8 % (ref 0.0–7.0)
Eosinophils Absolute: 0.2 10*3/uL (ref 0.0–0.5)
HCT: 33.7 % — ABNORMAL LOW (ref 34.8–46.6)
HGB: 11.6 g/dL (ref 11.6–15.9)
LYMPH%: 38.3 % (ref 14.0–49.7)
MCH: 33.1 pg (ref 25.1–34.0)
MCHC: 34.3 g/dL (ref 31.5–36.0)
MCV: 96.4 fL (ref 79.5–101.0)
MONO#: 0.2 10*3/uL (ref 0.1–0.9)
MONO%: 6.4 % (ref 0.0–14.0)
NEUT#: 1.2 10*3/uL — ABNORMAL LOW (ref 1.5–6.5)
NEUT%: 48.3 % (ref 38.4–76.8)
Platelets: 273 10*3/uL (ref 145–400)
RBC: 3.5 10*6/uL — ABNORMAL LOW (ref 3.70–5.45)
RDW: 15.9 % — ABNORMAL HIGH (ref 11.2–14.5)
WBC: 2.6 10*3/uL — ABNORMAL LOW (ref 3.9–10.3)
lymph#: 1 10*3/uL (ref 0.9–3.3)

## 2016-10-04 MED ORDER — HEPARIN SOD (PORK) LOCK FLUSH 100 UNIT/ML IV SOLN
500.0000 [IU] | Freq: Once | INTRAVENOUS | Status: AC
  Administered 2016-10-04: 500 [IU]
  Filled 2016-10-04: qty 5

## 2016-10-04 MED ORDER — SODIUM CHLORIDE 0.9% FLUSH
10.0000 mL | Freq: Once | INTRAVENOUS | Status: AC
  Administered 2016-10-04: 10 mL
  Filled 2016-10-04: qty 10

## 2016-10-04 MED ORDER — SODIUM CHLORIDE 0.9% FLUSH
10.0000 mL | INTRAVENOUS | Status: DC | PRN
  Administered 2016-10-04: 10 mL via INTRAVENOUS
  Filled 2016-10-04: qty 10

## 2016-10-04 NOTE — Patient Instructions (Signed)

## 2016-10-04 NOTE — Progress Notes (Signed)
Patient complains of generalized itching on 10/02/2016 and today. Patient states the itching is the same as when she was receiving Taxol. Patient's first dose of Abraxane was on 09/27/2016. Patient denies any fever, bumps or rashes. Dr. Jana Hakim notified.   Dr. Jana Hakim chairside.  No chemotherapy today, deaccess patient and discharge home per Dr. Jana Hakim. Patient verbalized understanding.

## 2016-10-04 NOTE — Progress Notes (Signed)
[place]  [phone] [phone]     ID: TASHARRA NODINE DOB: February 09, 1976  MR#: [id number]  WIO#:[id number]  Patient Care Team: Jonathon Jordan, MD as PCP - General (Family Medicine) Alphonsa Overall, MD as Consulting Physician (General Surgery) Magrinat, Virgie Dad, MD as Consulting Physician (Oncology) Eppie Gibson, MD as Attending Physician (Radiation Oncology) Osborne Oman, MD as Attending Physician (Obstetrics and Gynecology) Cheryll Cockayne, MD as Referring Physician (Surgical Oncology) Chauncey Cruel, MD OTHER MD:  CHIEF COMPLAINT: estrogen receptor positive breast cancer  CURRENT TREATMENT: Adjuvant radiation pending   BREAST CANCER HISTORY: From the original intake note:  Jakeya was evaluated at the [facility] in [place] in December, at which time a mass in the right breast was noted. She was referred for BCCCP and screening mammography was obtained, showing a possible mass in the right breast.on 04/21/2016 she underwent right diagnostic mammography with tomography and ultrasonography at the breast Center, and this confirmedan irregular spiculat in the right breast measuring 1.8 cm, which was not palpable to the mammographer. Ultrasonography confirmed an irregular hypoechoic right breast mass at the 12:30 o'clock position 4 cm from the nipple measuring 2.2 cm. The right axilla was sonographically benign.  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  Her subsequent history is detailed below  INTERVAL HISTORY: Johnathan returns today for follow-up and treatment of her estrogen receptor positive breast cancer accompanied by her sister. The patient was scheduled to  receive her second cycle of Abraxane today. We have changed her to Abraxane from Taxol because she was having a reaction to the Taxol.  Unfortunately over the last few days she developed the same reaction to the Abraxane. We are not going to be able to continue taxing treatment. This was discussed at length today.  REVIEW OF SYSTEMS: Emogene had a diffuse body rash which was extremely itchy. She denies stridor, cough, shortness of breath, or other significant symptoms. She never developed any peripheral neuropathy. A detailed review of systems today was stable   PAST MEDICAL HISTORY: Past Medical History:  Diagnosis Date  . Breast cancer ([place]) 05/2016   right  . Chronic constipation   . Family history of breast cancer   . Family history of ovarian cancer   . Family history of pancreatic cancer   . Family history of prostate cancer   . IBS (irritable bowel syndrome)     PAST SURGICAL HISTORY: Past Surgical History:  Procedure Laterality Date  . BARTHOLIN CYST MARSUPIALIZATION  09/04/2002  . BARTHOLIN GLAND CYST EXCISION Left 07/13/2005  . BREAST LUMPECTOMY WITH RADIOACTIVE SEED AND SENTINEL LYMPH NODE BIOPSY Right 06/07/2016   Procedure: BREAST LUMPECTOMY WITH RADIOACTIVE SEED AND SENTINEL LYMPH NODE BIOPSY;  Surgeon: Alphonsa Overall, MD;  Location: [facility];  Service: General;  Laterality: Right;  . HYSTEROSCOPY  2016  . MYOMECTOMY  07/20/2004  . OVARIAN CYST REMOVAL Right 07/20/2004  . PORTACATH PLACEMENT N/A 07/05/2016   Procedure: INSERTION PORT-A-CATH WITH [place];  Surgeon: Alphonsa Overall, MD;  Location: [facility];  Service: General;  Laterality: N/A;    FAMILY HISTORY Family History  Problem Relation Age of Onset  . Breast cancer Mother 51  . Pancreatic cancer Paternal Grandmother   . Prostate  cancer Paternal Grandfather   . Ovarian cancer Other        MGMs sister  . Breast cancer Other        mother's maternal first cousin  The patient's father,  [name], lives in [place] and works as a Geneticist, molecular. The patient's mother is a Quarry manager. The patient has no brother, one sister, who works in [place] as a Network engineer. The patient's mother had breast cancer, stage 0, diagnosed at age 40. There are 2 maternal relatives (mother's sister and mother's niece) with breast cancer, both postmenopausal.  GYNECOLOGIC HISTORY:  Menarche age 41, the patient is GX P0. She has regular periods, most recently mid-January. She is on oral contraceptives at present. No LMP recorded.   SOCIAL HISTORY:  The patient has a PhD degree and has worked for [employer] here and in [place] but is currently unemployed.  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 She generally lives in [place] by herself although she is currently staying at her parents.    ADVANCED DIRECTIVES: Not in place. The patient tells me she intends to name her sister as her healthcare power of attorney.   HEALTH MAINTENANCE: Social History  Substance Use Topics  . Smoking status: Never Smoker  . Smokeless tobacco: Never Used  . Alcohol use 0.6 oz/week    1 Glasses of wine per week     Comment: socially     Colonoscopy: July 2016, in [place]  PAP:  Bone density: Never   Allergies  Allergen Reactions  . Amoxicillin Rash    Current Outpatient Prescriptions  Medication Sig Dispense Refill  . Coenzyme Q10 (COQ10 PO) Take by mouth. LIQUID    . dexamethasone (DECADRON) 4 MG tablet Take 2 tablets by mouth once a day on the day after chemotherapy and then take 2 tablets two times a day for 2 days. Take with food. 30 tablet 1  . doxycycline (VIBRA-TABS) 100 MG tablet Take 1 tablet (100 mg total) by mouth 2 (two) times daily. 14 tablet 0  . lidocaine-prilocaine (EMLA) cream Apply 1 application topically as needed. Apply over power port site about 30 to 60 minutes before chemotherapy. 30 g 0  . loratadine (CLARITIN) 10 MG tablet Take 1 tablet (10 mg  total) by mouth daily. 30 tablet 1  . OVER THE COUNTER MEDICATION Take 7 tablets by mouth 2 (two) times daily. SUPER COLON CLEANSE    . prochlorperazine (COMPAZINE) 10 MG tablet Take 1 tablet (10 mg total) by mouth every 6 (six) hours as needed (Nausea or vomiting). 30 tablet 1  . tamoxifen (NOLVADEX) 20 MG tablet Take 1 tablet (20 mg total) by mouth daily. (Patient not taking: Reported on 08/23/2016) 30 tablet 4   Current Facility-Administered Medications  Medication Dose Route Frequency Provider Last Rate Last Dose  . sodium chloride 0.9 % injection 10 mL  10 mL Intravenous Once Magrinat, Virgie Dad, MD        OBJECTIVE: A young African-American woman in no acute distress  For today's vital see the note in the treatment records There were no vitals filed for this visit.   There is no height or weight on file to calculate BMI.    ECOG FS:1 .Sclerae unicteric, pupils round and equal Oropharynx clear and moist No cervical or supraclavicular adenopathy Lungs no rales or rhonchi Heart regular rate and rhythm Abd soft, nontender, positive bowel sounds MSK no focal spinal tenderness, no upper extremity lymphedema Neuro: nonfocal,  well oriented, appropriate affect Breasts: Deferred   LAB RESULTS:  CMP     Component Value Date/Time   NA 145 10/04/2016 1047   K 4.3 10/04/2016 1047   CO2 27 10/04/2016 1047   GLUCOSE 82 10/04/2016 1047   BUN 6.9 (L) 10/04/2016 1047   CREATININE 0.9 10/04/2016 1047   CALCIUM 9.3 10/04/2016 1047   PROT 6.4 10/04/2016 1047   ALBUMIN 3.8 10/04/2016 1047   AST 33 10/04/2016 1047   ALT 39 10/04/2016 1047   ALKPHOS 76 10/04/2016 1047   BILITOT 0.45 10/04/2016 1047    INo results found for: SPEP, UPEP  Lab Results  Component Value Date   WBC 2.6 (L) 10/04/2016   NEUTROABS 1.2 (L) 10/04/2016   HGB 11.6 10/04/2016   HCT 33.7 (L) 10/04/2016   MCV 96.4 10/04/2016   PLT 273 10/04/2016      Chemistry      Component Value Date/Time   NA 145 10/04/2016  1047   K 4.3 10/04/2016 1047   CO2 27 10/04/2016 1047   BUN 6.9 (L) 10/04/2016 1047   CREATININE 0.9 10/04/2016 1047      Component Value Date/Time   CALCIUM 9.3 10/04/2016 1047   ALKPHOS 76 10/04/2016 1047   AST 33 10/04/2016 1047   ALT 39 10/04/2016 1047   BILITOT 0.45 10/04/2016 1047       No results found for: LABCA2  No components found for: LABCA125  No results for input(s): INR in the last 168 hours.  Urinalysis No results found for: COLORURINE, APPEARANCEUR, LABSPEC, PHURINE, GLUCOSEU, HGBUR, BILIRUBINUR, KETONESUR, PROTEINUR, UROBILINOGEN, NITRITE, LEUKOCYTESUR   STUDIES: Dg Abd 1 View  Result Date: 09/23/2016 CLINICAL DATA:  Follow-up Sitz marker study EXAM: ABDOMEN - 1 VIEW COMPARISON:  09/19/2016 FINDINGS: Scattered large and small bowel gas is noted. Fecal material is noted throughout the colon. The stomach is distended with food stuffs. The previously seen Sitz markers have all passed. No bony abnormality is noted. IMPRESSION: Stable fecal load suspicious for constipation. The previously seen Sitz markers have passed in the interval from the prior exam. Electronically Signed   By: Inez Catalina M.D.   On: 09/23/2016 12:09   Dg Abd 1 View  Result Date: 09/19/2016 CLINICAL DATA:  Sitz markers taken yesterday / concern for motility / current hx of breast Ca and chemo / jdh 315 EXAM: ABDOMEN - 1 VIEW COMPARISON:  None. FINDINGS: Sitz markers scattered throughout the colon. Moderate amount of stool within the nondistended colon. No dilated large or small bowel loops. No evidence of soft tissue mass or abnormal fluid collection. No evidence of free intraperitoneal air. Osseous structures are unremarkable. IMPRESSION: 1. Scattered Sitz markers throughout the colon. 2. Moderate amount of stool throughout the colon (constipation? ). 3. Nonobstructive bowel gas pattern. Electronically Signed   By: Franki Cabot M.D.   On: 09/19/2016 09:43    ELIGIBLE FOR AVAILABLE RESEARCH  PROTOCOL: no  ASSESSMENT: 41 y.o. [place] woman currently residing in [place], status post right breast upper inner quadrant biopsy 04/21/2016 for a clinical T2 N0, stage IIa invasive ductal carcinoma, grade 2, estrogen and progesterone receptor positive, HER-2 nonamplified, with an MIB-1 of 20%  (a) biopsy of 2 additional suspicious areas in the right breast 05/12/2016 was benign  (1) started tamoxifen 04/27/2016, Discontinued at the start of chemotherapy  (2) Oncotype DX obtained from the original biopsy showed a score of 14, predicting a 10 year risk of recurrence outside the breast of 9%  if the patient's only systemic therapy is tamoxifen for 5 years. It also predicts no benefit from chemotherapy.   (3) patient is not interested in fertility preservation  (4) genetics testing 04/27/2016 through the [facility] gene panel offered by [facility] found no deleterious mutations in APC, ATM, BARD1, BMPR1A, BRCA1, BRCA2, BRIP1, CHD1, CDK4, CDKN2A, CHEK2, EPCAM (large rearrangement only), MLH1, MSH2, MSH6, MUTYH, NBN, PALB2, PMS2, PTEN, RAD51C, RAD51D, SMAD4, STK11, and TP53. Sequencing was performed for select regions of POLE and POLD1, and large rearrangement analysis was performed for select regions of GREM1.   (5) status post right lumpectomy with sentinel lymph node sampling 06/07/2016 for a pT1c pN1, stage IB invasive ductal carcinoma, grade 2, with negative margins   (6) Mammaprint sent from the final surgical sample was read as high risk, indicating a need for chemotherapy   (7) doxorubicin and cyclophosphamide in dose dense fashion 4 to started 07/12/2016, completed 08/23/2016 followed by paclitaxel weekly 12 started 09/06/2016 (received 2 cycles), changed to Abraxane on 09/27/2016 due to reaction to Paclitaxel: Abraxane discontinued after 1 cycle because of the same reaction.  (8) adjuvant radiation  to follow   (9) to resume tamoxifen at the completion of  local treatment  PLAN: Ileane is not going to be able to tolerate taxanes and so we are stopping her adjuvant chemotherapy at this point. She understands she has received the major portion of her chemotherapy treatment and I would expect that to have a significant effect on risk reduction. What it will really more significantly reduce her risk of course will be a course of anti-estrogens which we'll follow the radiation.  She was very concerned about not completing the chemotherapy. This is understandable. I explained to her that she will be followed here closely but that there is no indication for doing scans, which her sister was interested in doing, since they do not answer the questions they would like to have answered, namely whether she has occult disease elsewhere.  At this point she is ready to proceed to radiation. She may also have her port removed at any time that that is convenient.   Chauncey Cruel, MD   10/04/2016 10:00 PM Medical Oncology and Hematology [facility] [address] [place], [place] [address] Tel. [phone]    Fax. [phone]

## 2016-10-07 ENCOUNTER — Encounter: Payer: Self-pay | Admitting: Radiation Oncology

## 2016-10-10 ENCOUNTER — Encounter: Payer: Self-pay | Admitting: Obstetrics

## 2016-10-11 ENCOUNTER — Other Ambulatory Visit: Payer: Medicaid Other

## 2016-10-11 ENCOUNTER — Ambulatory Visit: Payer: Medicaid Other

## 2016-10-11 NOTE — Progress Notes (Signed)
Location of Breast Cancer: Right Breast  Histology per Pathology Report:  04/21/16 Diagnosis Breast, right, needle core biopsy, 12:30 o'clock - INVASIVE DUCTAL CARCINOMA. - DUCTAL CARCINOMA IN SITU. - ASSOCIATED CALCIFICATIONS.  Receptor Status: ER(95%), PR (100%), Her2-neu (NEG), Ki-(20%)  05/12/16 Diagnosis 1. Breast, right, needle core biopsy, LOQ, inferior lesion - ADENOSIS WITH CALCIFICATIONS. 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 - NO MALIGNANCY IDENTIFIED. 2. Breast, right, needle core biopsy, LOQ superior lesion - FOCAL DENSE FIBROSIS. - NO MALIGNANCY IDENTIFIED.  06/07/16 Diagnosis 1. Breast, lumpectomy, Right w/seed INVASIVE DUCTAL CARCINOMA, GRADE 2, SPANNING 1.8 CM DUCTAL CARCINOMA IN SITU LYMPHOVASCULAR INVASION IS IDENTIFIED ALL MARGINS OF RESECTION ARE NEGATIVE FOR CARCINOMA THE INVASIVE CARCINOMA IS 2 MM FROM THE POSTERIOR RESECTION MARGIN DUTAL CARCINOMA IN SITU IS FOCALLY LESS THAN 2 MM FROM THE ANTERIOR RESECTION MARGIN 2. Lymph node, sentinel, biopsy, Right Axillary #1 ONE BENIGN LYMPH NODE (0/1) 3. Lymph node, sentinel, biopsy, Right METASTATIC DUCTAL CARCINOMA IN ONE LYMPH NODE (1/1) 4. Lymph node, sentinel, biopsy, Right MICROMETASTIC DUCTAL CARCINOMA (1/1)  Did patient present with symptoms or was this found on screening mammography?: She was evaluated at the [facility] in [place] in December 2017, at which time a mass in the right breast was noted.   Past/Anticipated interventions by surgeon, if any: 06/07/16 PROCEDURE: Procedure(s): Right BREAST LUMPECTOMY WITH RADIOACTIVE SEED AND right axillary SENTINEL LYMPH NODE BIOPSY, deep sentinel lymph node biopsy SURGEON: Alphonsa Overall, M.D.  Past/Anticipated interventions by medical oncology, if any:  Dr. Jana Hakim 10/04/16 --Doxorubicin and cyclophosphamide in dose dense fashion 4 to started 07/12/2016, completed 08/23/2016 followed by paclitaxel weekly 12  started 09/06/2016 (received 2 cycles), changed to Abraxane on 09/27/2016 due to reaction to Paclitaxel: Abraxane discontinued after 1 cycle because of the same reaction.  --Adjuvant radiation  to follow   --To resume tamoxifen at the completion of local treatment   Lymphedema issues, if any:  She denies. She is able to lift her arm above her head.   Pain issues, if any:  She denies.   SAFETY ISSUES:  Prior radiation? No  Pacemaker/ICD? No  Possible current pregnancy? She denies. She has not had periods since tamoxifen and chemotherapy. She denies sexual activity at this time.   Is the patient on methotrexate? No  Current Complaints / other details:  BP 106/70   Pulse 70   Temp 98.4 F (36.9 C)   Ht _0  (1.626 m)   Wt 141 lb (64 kg)   SpO2 100% Comment: room air  BMI 24.20 kg/m    Wt Readings from Last 3 Encounters:  10/19/16 141 lb (64 kg)  09/27/16 140 lb (63.5 kg)  09/13/16 140 lb (63.5 kg)

## 2016-10-13 ENCOUNTER — Other Ambulatory Visit: Payer: Self-pay | Admitting: Oncology

## 2016-10-17 ENCOUNTER — Telehealth: Payer: Self-pay | Admitting: Oncology

## 2016-10-17 NOTE — Telephone Encounter (Signed)
lvm to inform pt of 9/6 appt at 1115 per sch msg

## 2016-10-18 ENCOUNTER — Other Ambulatory Visit: Payer: Medicaid Other

## 2016-10-18 ENCOUNTER — Ambulatory Visit: Payer: Medicaid Other

## 2016-10-19 ENCOUNTER — Ambulatory Visit
Admission: RE | Admit: 2016-10-19 | Discharge: 2016-10-19 | Disposition: A | Discharge status: Home / Self Care | Payer: Medicaid Other | Source: Ambulatory Visit | Attending: Radiation Oncology | Admitting: Radiation Oncology

## 2016-10-19 ENCOUNTER — Encounter: Payer: Self-pay | Admitting: Radiation Oncology

## 2016-10-19 DIAGNOSIS — Z170 Estrogen receptor positive status [ER+]: Secondary | ICD-10-CM

## 2016-10-19 DIAGNOSIS — C50211 Malignant neoplasm of upper-inner quadrant of right female breast: Secondary | ICD-10-CM | POA: Diagnosis not present

## 2016-10-19 DIAGNOSIS — Z79899 Other long term (current) drug therapy: Secondary | ICD-10-CM | POA: Diagnosis not present

## 2016-10-19 DIAGNOSIS — Z880 Allergy status to penicillin: Secondary | ICD-10-CM | POA: Insufficient documentation

## 2016-10-19 NOTE — Progress Notes (Signed)
Radiation Oncology         [phone] ________________________________  Name: Candice [name] MRN: [id number]  Date: 10/19/2016  DOB: 05/17/1975  followup Note  Outpatient  CC: Jonathon Jordan, MD  Magrinat, Virgie Dad, MD  Diagnosis:      ICD-10-CM   1. Malignant neoplasm of upper-inner quadrant of right breast in female, estrogen receptor positive ([place]) C50.211    Z17.0     Cancer Staging Malignant neoplasm of upper-inner quadrant of right breast in female, estrogen receptor positive ([place]) Staging form: Breast, AJCC 8th Edition - Clinical: cT2, cN0, cM0, ER: Positive, PR: Positive, HER2: Negative - Unsigned  cT2N0M0 Right Breast UIQ Invasive Ductal Carcinoma, ER/PR Positive, Her2 Negative, Grade 2. Pathologic Stage T1cN1a.  CHIEF COMPLAINT: Here to discuss management of right breast cancer.  Narrative:  The patient returns today for follow-up. She underwent chemotherapy after mammaprint testing warranted doing so.  This was stopped early due to intolerance of taxol and Abraxane.  She has been referred back by Dr Jana Hakim for radiotherapy. She denies chance of pregnancy.    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  She denies pain or lymphedema.               ALLERGIES:  is allergic to paclitaxel and amoxicillin.  Meds: Current Outpatient Prescriptions  Medication Sig Dispense Refill  . lidocaine-prilocaine (EMLA) cream Apply 1 application topically as needed. Apply over power port site about 30 to 60 minutes before chemotherapy. 30 g 0  . OVER THE COUNTER MEDICATION Take 7 tablets by mouth 2 (two) times daily. SUPER COLON CLEANSE    . loratadine (CLARITIN) 10 MG tablet Take 1 tablet (10 mg total) by mouth daily. 30 tablet 1  . tamoxifen (NOLVADEX) 20 MG tablet Take 1 tablet (20 mg total) by mouth daily. (Patient not taking: Reported on 08/23/2016) 30 tablet 4   Current Facility-Administered Medications  Medication Dose Route Frequency Provider Last  Rate Last Dose  . sodium chloride 0.9 % injection 10 mL  10 mL Intravenous Once Magrinat, Virgie Dad, MD        Physical Findings:  height is '5\' 4"'  (1.626 m) and weight is 141 lb (64 kg). Her temperature is 98.4 F (36.9 C). Her blood pressure is 106/70 and her pulse is 70. Her oxygen saturation is 100%.  General: Alert and oriented, in no acute distress. HEENT: Oropharynx and oral cavity are clear. Neck: Neck is supple, no palpable cervical or supraclavicular lymphadenopathy. A palpable lymph node in the level 3/4 region of the left neck which is mobile and flat, approximately 8 mm in greatest dimension. This feels benign and stable. Heart: Regular in rate and rhythm with no murmurs. Chest: Clear to auscultation bilaterally. Patient has a portacath in the left upper chest.   Extremities: No edema in arms or ankles. Lymphatics: see Neck Exam Musculoskeletal: Good range of motion in right arm. Breast exam reveals a central lumpectomy scar in the right breast which is healing well, as well as right axillary scar which is healing well. No palpable mass or tenderness in her left breast .  Lab Findings: Lab Results  Component Value Date   WBC 2.6 (L) 10/04/2016   HGB 11.6 10/04/2016   HCT 33.7 (L) 10/04/2016   MCV 96.4 10/04/2016   PLT 273 10/04/2016     Radiographic Findings: Dg Abd 1 View  Result Date: 09/23/2016 CLINICAL DATA:  Follow-up Sitz marker study EXAM: ABDOMEN - 1  VIEW COMPARISON:  09/19/2016 FINDINGS: Scattered large and small bowel gas is noted. Fecal material is noted throughout the colon. The stomach is distended with food stuffs. The previously seen Sitz markers have all passed. No bony abnormality is noted. IMPRESSION: Stable fecal load suspicious for constipation. The previously seen Sitz markers have passed in the interval from the prior exam. Electronically Signed   By: Inez Catalina M.D.   On: 09/23/2016 12:09    Impression/Plan: We discussed adjuvant radiotherapy  today.  I recommend 6 weeks of radiation therapy in order to reduce the risk of locoregional recurrence by 2/3.  The risks, benefits and side effects of this treatment were discussed in detail.  She understands that radiotherapy is associated with skin irritation and fatigue in the acute setting. Late effects can include cosmetic changes and rare injury to internal organs. She is enthusiastic about proceeding with treatment. A consent form has been signed and placed in her chart.  A total of 5 medically necessary complex treatment devices will be fabricated and supervised by me: 4 fields with MLCs for custom blocks to protect heart, and lungs;  and, a Vac-lok. MORE COMPLEX DEVICES MAY BE MADE IN DOSIMETRY FOR FIELD IN FIELD BEAMS FOR DOSE HOMOGENEITY.  I have requested : 3D Simulation which is medically necessary to give adequate dose to at risk tissues while sparing lungs and heart.  I have requested a DVH of the following structures: lungs, heart, right lumpectomy cavity.    The patient will receive 50 Gy in 25 fractions to the right breast and regional nodes with 4 fields.  This will be followed by a boost.  The left lower neck node feels benign, and I do not recommend further workup It is small but palpable; she is thin.  We will plan to simulate patient in the next week and start radiation treatment in early to mid August. We will work around patient's wisdom tooth appointment. I will order a urine pregnancy test out of caution.  I spent 30 minutes face to face with the patient and more than 50% of that time was spent in counseling and/or coordination of care. ______________________   Eppie Gibson, MD  This document serves as a record of services personally performed by Eppie Gibson, MD. It was created on her behalf by Valeta Harms, a trained medical scribe. The creation of this record is based on the scribe's personal observations and the provider's statements to them. This document has been  checked and approved by the attending provider.

## 2016-10-22 ENCOUNTER — Other Ambulatory Visit: Payer: Self-pay | Admitting: Radiation Oncology

## 2016-10-22 DIAGNOSIS — C50211 Malignant neoplasm of upper-inner quadrant of right female breast: Secondary | ICD-10-CM

## 2016-10-22 DIAGNOSIS — Z170 Estrogen receptor positive status [ER+]: Secondary | ICD-10-CM

## 2016-10-24 ENCOUNTER — Other Ambulatory Visit (HOSPITAL_COMMUNITY)
Admission: RE | Admit: 2016-10-24 | Discharge: 2016-10-24 | Disposition: A | Discharge status: Home / Self Care | Payer: Medicaid Other | Source: Ambulatory Visit | Attending: Radiation Oncology | Admitting: Radiation Oncology

## 2016-10-24 ENCOUNTER — Ambulatory Visit
Admission: RE | Admit: 2016-10-24 | Discharge: 2016-10-24 | Disposition: A | Discharge status: Home / Self Care | Payer: Medicaid Other | Source: Ambulatory Visit | Attending: Radiation Oncology | Admitting: Radiation Oncology

## 2016-10-24 DIAGNOSIS — C50211 Malignant neoplasm of upper-inner quadrant of right female breast: Secondary | ICD-10-CM | POA: Diagnosis not present

## 2016-10-24 DIAGNOSIS — Z170 Estrogen receptor positive status [ER+]: Secondary | ICD-10-CM

## 2016-10-24 LAB — PREGNANCY, URINE: Preg Test, Ur: NEGATIVE

## 2016-10-25 ENCOUNTER — Other Ambulatory Visit: Payer: Medicaid Other

## 2016-10-25 ENCOUNTER — Ambulatory Visit: Payer: Medicaid Other

## 2016-10-25 NOTE — Progress Notes (Signed)
  Radiation Oncology         [phone] ________________________________  Name: Candice [name] MRN: [id number]  Date: 10/24/2016  DOB: [birth year]  SIMULATION AND TREATMENT PLANNING NOTE    Outpatient  DIAGNOSIS:     ICD-10-CM   1. Malignant neoplasm of upper-inner quadrant of right breast in female, estrogen receptor positive ([place]) C50.211    Z17.0     NARRATIVE:  The patient was brought to the [facility].  Identity was confirmed.  All relevant records and images related to the planned course of therapy were reviewed.  The patient freely provided informed written consent to proceed with treatment after reviewing the details related to the planned course of therapy. The consent form was witnessed and verified by the simulation staff.    Then, the patient was set-up in a stable reproducible supine position for radiation therapy with her ipsilateral arm over her head, and her upper body secured in a custom-made Vac-lok device.  CT images were obtained.  Surface markings were placed.  The CT images were loaded into the planning software.    TREATMENT PLANNING NOTE: Treatment planning then occurred.  The radiation prescription was entered and confirmed.     A total of 5 medically necessary complex treatment devices were fabricated and supervised by me: 4 fields with MLCs for custom blocks to protect heart, and lungs;  and, a Vac-lok. MORE COMPLEX DEVICES MAY BE MADE IN DOSIMETRY FOR FIELD IN FIELD BEAMS FOR DOSE HOMOGENEITY.  I have requested : 3D Simulation which is medically necessary to give adequate dose to at risk tissues while sparing lungs and heart.  I have requested a DVH of the following structures: lungs, heart, lumpectomy cavity.    The patient will receive 50 Gy in 25 fractions to the right breast and majority of the internal mammary nodes with 2 tangential fields.  SCV and posterior axillary region on right will be treated to 50Gy with two additional fields.  This will be followed by a boost to the breast.  Optical Surface Tracking Plan:  Since intensity modulated radiotherapy (IMRT) and 3D conformal radiation treatment methods are predicated on accurate and precise positioning for treatment, intrafraction motion monitoring is medically necessary to ensure accurate and safe treatment delivery. The ability to quantify intrafraction motion without excessive ionizing radiation dose can only be performed with optical surface tracking. Accordingly, surface imaging offers the opportunity to obtain 3D measurements of patient position throughout IMRT and 3D treatments without excessive radiation exposure. I am ordering optical surface tracking for this patient's upcoming course of radiotherapy.  ________________________________   Reference:  Ursula Alert, J, et al. Surface imaging-based analysis of intrafraction motion for breast radiotherapy patients.Journal of [place], n. 6, nov. 2014. ISSN [id number].  Available at: <http://www.jacmp.org/index.php/jacmp/article/view/4957>.    -----------------------------------  Eppie Gibson, MD

## 2016-10-26 DIAGNOSIS — C50211 Malignant neoplasm of upper-inner quadrant of right female breast: Secondary | ICD-10-CM | POA: Diagnosis not present

## 2016-10-31 ENCOUNTER — Ambulatory Visit
Admission: RE | Admit: 2016-10-31 | Discharge: 2016-10-31 | Disposition: A | Discharge status: Home / Self Care | Payer: Medicaid Other | Source: Ambulatory Visit | Attending: Radiation Oncology | Admitting: Radiation Oncology

## 2016-10-31 DIAGNOSIS — C50211 Malignant neoplasm of upper-inner quadrant of right female breast: Secondary | ICD-10-CM | POA: Diagnosis not present

## 2016-11-01 ENCOUNTER — Ambulatory Visit
Admission: RE | Admit: 2016-11-01 | Discharge: 2016-11-01 | Disposition: A | Discharge status: Home / Self Care | Payer: Medicaid Other | Source: Ambulatory Visit | Attending: Radiation Oncology | Admitting: Radiation Oncology

## 2016-11-01 DIAGNOSIS — C50211 Malignant neoplasm of upper-inner quadrant of right female breast: Secondary | ICD-10-CM | POA: Diagnosis not present

## 2016-11-02 ENCOUNTER — Ambulatory Visit
Admission: RE | Admit: 2016-11-02 | Discharge: 2016-11-02 | Disposition: A | Discharge status: Home / Self Care | Payer: Medicaid Other | Source: Ambulatory Visit | Attending: Radiation Oncology | Admitting: Radiation Oncology

## 2016-11-02 DIAGNOSIS — C50211 Malignant neoplasm of upper-inner quadrant of right female breast: Secondary | ICD-10-CM | POA: Diagnosis not present

## 2016-11-03 ENCOUNTER — Ambulatory Visit: Payer: Medicaid Other

## 2016-11-03 ENCOUNTER — Ambulatory Visit: Admission: RE | Admit: 2016-11-03 | Payer: Medicaid Other | Source: Ambulatory Visit

## 2016-11-04 ENCOUNTER — Ambulatory Visit
Admission: RE | Admit: 2016-11-04 | Discharge: 2016-11-04 | Disposition: A | Discharge status: Home / Self Care | Payer: Medicaid Other | Source: Ambulatory Visit | Attending: Radiation Oncology | Admitting: Radiation Oncology

## 2016-11-04 DIAGNOSIS — C50211 Malignant neoplasm of upper-inner quadrant of right female breast: Secondary | ICD-10-CM | POA: Diagnosis not present

## 2016-11-07 ENCOUNTER — Ambulatory Visit
Admission: RE | Admit: 2016-11-07 | Discharge: 2016-11-07 | Disposition: A | Discharge status: Home / Self Care | Payer: Medicaid Other | Source: Ambulatory Visit | Attending: Radiation Oncology | Admitting: Radiation Oncology

## 2016-11-07 DIAGNOSIS — C50211 Malignant neoplasm of upper-inner quadrant of right female breast: Secondary | ICD-10-CM | POA: Diagnosis not present

## 2016-11-07 DIAGNOSIS — Z170 Estrogen receptor positive status [ER+]: Secondary | ICD-10-CM

## 2016-11-07 MED ORDER — RADIAPLEXRX EX GEL
Freq: Once | CUTANEOUS | Status: AC
  Administered 2016-11-07: 12:00:00 via TOPICAL

## 2016-11-07 MED ORDER — ALRA NON-METALLIC DEODORANT (RAD-ONC)
1.0000 "application " | Freq: Once | TOPICAL | Status: AC
  Administered 2016-11-07: 1 via TOPICAL

## 2016-11-07 NOTE — Progress Notes (Signed)

## 2016-11-08 ENCOUNTER — Ambulatory Visit
Admission: RE | Admit: 2016-11-08 | Discharge: 2016-11-08 | Disposition: A | Discharge status: Home / Self Care | Payer: Medicaid Other | Source: Ambulatory Visit | Attending: Radiation Oncology | Admitting: Radiation Oncology

## 2016-11-08 DIAGNOSIS — C50211 Malignant neoplasm of upper-inner quadrant of right female breast: Secondary | ICD-10-CM | POA: Diagnosis not present

## 2016-11-09 ENCOUNTER — Ambulatory Visit
Admission: RE | Admit: 2016-11-09 | Discharge: 2016-11-09 | Disposition: A | Discharge status: Home / Self Care | Payer: Medicaid Other | Source: Ambulatory Visit | Attending: Radiation Oncology | Admitting: Radiation Oncology

## 2016-11-09 DIAGNOSIS — C50211 Malignant neoplasm of upper-inner quadrant of right female breast: Secondary | ICD-10-CM | POA: Diagnosis not present

## 2016-11-10 ENCOUNTER — Ambulatory Visit
Admission: RE | Admit: 2016-11-10 | Discharge: 2016-11-10 | Disposition: A | Discharge status: Home / Self Care | Payer: Medicaid Other | Source: Ambulatory Visit | Attending: Radiation Oncology | Admitting: Radiation Oncology

## 2016-11-10 DIAGNOSIS — C50211 Malignant neoplasm of upper-inner quadrant of right female breast: Secondary | ICD-10-CM | POA: Diagnosis not present

## 2016-11-11 ENCOUNTER — Ambulatory Visit
Admission: RE | Admit: 2016-11-11 | Discharge: 2016-11-11 | Disposition: A | Discharge status: Home / Self Care | Payer: Medicaid Other | Source: Ambulatory Visit | Attending: Radiation Oncology | Admitting: Radiation Oncology

## 2016-11-11 DIAGNOSIS — C50211 Malignant neoplasm of upper-inner quadrant of right female breast: Secondary | ICD-10-CM | POA: Diagnosis not present

## 2016-11-14 ENCOUNTER — Ambulatory Visit
Admission: RE | Admit: 2016-11-14 | Discharge: 2016-11-14 | Disposition: A | Discharge status: Home / Self Care | Payer: Medicaid Other | Source: Ambulatory Visit | Attending: Radiation Oncology | Admitting: Radiation Oncology

## 2016-11-14 DIAGNOSIS — C50211 Malignant neoplasm of upper-inner quadrant of right female breast: Secondary | ICD-10-CM | POA: Diagnosis not present

## 2016-11-15 ENCOUNTER — Ambulatory Visit
Admission: RE | Admit: 2016-11-15 | Discharge: 2016-11-15 | Disposition: A | Discharge status: Home / Self Care | Payer: Medicaid Other | Source: Ambulatory Visit | Attending: Radiation Oncology | Admitting: Radiation Oncology

## 2016-11-15 DIAGNOSIS — C50211 Malignant neoplasm of upper-inner quadrant of right female breast: Secondary | ICD-10-CM | POA: Diagnosis not present

## 2016-11-16 ENCOUNTER — Ambulatory Visit
Admission: RE | Admit: 2016-11-16 | Discharge: 2016-11-16 | Disposition: A | Discharge status: Home / Self Care | Payer: Medicaid Other | Source: Ambulatory Visit | Attending: Radiation Oncology | Admitting: Radiation Oncology

## 2016-11-16 DIAGNOSIS — C50211 Malignant neoplasm of upper-inner quadrant of right female breast: Secondary | ICD-10-CM | POA: Diagnosis not present

## 2016-11-17 ENCOUNTER — Ambulatory Visit: Payer: Medicaid Other

## 2016-11-18 ENCOUNTER — Ambulatory Visit: Payer: Medicaid Other

## 2016-11-21 ENCOUNTER — Ambulatory Visit
Admission: RE | Admit: 2016-11-21 | Discharge: 2016-11-21 | Disposition: A | Discharge status: Home / Self Care | Payer: Medicaid Other | Source: Ambulatory Visit | Attending: Radiation Oncology | Admitting: Radiation Oncology

## 2016-11-21 DIAGNOSIS — C50211 Malignant neoplasm of upper-inner quadrant of right female breast: Secondary | ICD-10-CM

## 2016-11-21 DIAGNOSIS — Z170 Estrogen receptor positive status [ER+]: Secondary | ICD-10-CM

## 2016-11-21 MED ORDER — RADIAPLEXRX EX GEL
Freq: Once | CUTANEOUS | Status: AC
  Administered 2016-11-21: 17:00:00 via TOPICAL

## 2016-11-22 ENCOUNTER — Ambulatory Visit
Admission: RE | Admit: 2016-11-22 | Discharge: 2016-11-22 | Disposition: A | Discharge status: Home / Self Care | Payer: Medicaid Other | Source: Ambulatory Visit | Attending: Radiation Oncology | Admitting: Radiation Oncology

## 2016-11-22 DIAGNOSIS — C50211 Malignant neoplasm of upper-inner quadrant of right female breast: Secondary | ICD-10-CM | POA: Diagnosis not present

## 2016-11-23 ENCOUNTER — Ambulatory Visit
Admission: RE | Admit: 2016-11-23 | Discharge: 2016-11-23 | Disposition: A | Discharge status: Home / Self Care | Payer: Medicaid Other | Source: Ambulatory Visit | Attending: Radiation Oncology | Admitting: Radiation Oncology

## 2016-11-23 DIAGNOSIS — C50211 Malignant neoplasm of upper-inner quadrant of right female breast: Secondary | ICD-10-CM | POA: Diagnosis not present

## 2016-11-24 ENCOUNTER — Ambulatory Visit
Admission: RE | Admit: 2016-11-24 | Discharge: 2016-11-24 | Disposition: A | Discharge status: Home / Self Care | Payer: Medicaid Other | Source: Ambulatory Visit | Attending: Radiation Oncology | Admitting: Radiation Oncology

## 2016-11-24 DIAGNOSIS — C50211 Malignant neoplasm of upper-inner quadrant of right female breast: Secondary | ICD-10-CM | POA: Diagnosis not present

## 2016-11-25 ENCOUNTER — Ambulatory Visit: Payer: Medicaid Other

## 2016-11-29 ENCOUNTER — Ambulatory Visit
Admission: RE | Admit: 2016-11-29 | Discharge: 2016-11-29 | Disposition: A | Discharge status: Home / Self Care | Payer: Medicaid Other | Source: Ambulatory Visit | Attending: Radiation Oncology | Admitting: Radiation Oncology

## 2016-11-29 DIAGNOSIS — C50211 Malignant neoplasm of upper-inner quadrant of right female breast: Secondary | ICD-10-CM | POA: Diagnosis not present

## 2016-11-30 ENCOUNTER — Ambulatory Visit
Admission: RE | Admit: 2016-11-30 | Discharge: 2016-11-30 | Disposition: A | Discharge status: Home / Self Care | Payer: Medicaid Other | Source: Ambulatory Visit | Attending: Radiation Oncology | Admitting: Radiation Oncology

## 2016-11-30 DIAGNOSIS — C50211 Malignant neoplasm of upper-inner quadrant of right female breast: Secondary | ICD-10-CM | POA: Diagnosis not present

## 2016-12-01 ENCOUNTER — Ambulatory Visit
Admission: RE | Admit: 2016-12-01 | Discharge: 2016-12-01 | Disposition: A | Discharge status: Home / Self Care | Payer: Medicaid Other | Source: Ambulatory Visit | Attending: Radiation Oncology | Admitting: Radiation Oncology

## 2016-12-01 ENCOUNTER — Other Ambulatory Visit (HOSPITAL_BASED_OUTPATIENT_CLINIC_OR_DEPARTMENT_OTHER): Payer: Medicaid Other

## 2016-12-01 ENCOUNTER — Ambulatory Visit (HOSPITAL_BASED_OUTPATIENT_CLINIC_OR_DEPARTMENT_OTHER): Payer: Medicaid Other | Admitting: Oncology

## 2016-12-01 VITALS — BP 150/84 | HR 88 | Temp 97.5°F | Resp 17 | Ht 64.0 in | Wt 144.4 lb

## 2016-12-01 DIAGNOSIS — D72819 Decreased white blood cell count, unspecified: Secondary | ICD-10-CM | POA: Diagnosis not present

## 2016-12-01 DIAGNOSIS — R635 Abnormal weight gain: Secondary | ICD-10-CM | POA: Diagnosis not present

## 2016-12-01 DIAGNOSIS — Z170 Estrogen receptor positive status [ER+]: Secondary | ICD-10-CM

## 2016-12-01 DIAGNOSIS — C50211 Malignant neoplasm of upper-inner quadrant of right female breast: Secondary | ICD-10-CM

## 2016-12-01 LAB — CBC WITH DIFFERENTIAL/PLATELET
BASO%: 0.4 % (ref 0.0–2.0)
Basophils Absolute: 0 10*3/uL (ref 0.0–0.1)
EOS%: 9 % — ABNORMAL HIGH (ref 0.0–7.0)
Eosinophils Absolute: 0.2 10*3/uL (ref 0.0–0.5)
HCT: 38.6 % (ref 34.8–46.6)
HGB: 13.4 g/dL (ref 11.6–15.9)
LYMPH%: 35.7 % (ref 14.0–49.7)
MCH: 31.5 pg (ref 25.1–34.0)
MCHC: 34.7 g/dL (ref 31.5–36.0)
MCV: 90.8 fL (ref 79.5–101.0)
MONO#: 0.2 10*3/uL (ref 0.1–0.9)
MONO%: 9 % (ref 0.0–14.0)
NEUT#: 1.2 10*3/uL — ABNORMAL LOW (ref 1.5–6.5)
NEUT%: 45.9 % (ref 38.4–76.8)
Platelets: 226 10*3/uL (ref 145–400)
RBC: 4.25 10*6/uL (ref 3.70–5.45)
RDW: 12.2 % (ref 11.2–14.5)
WBC: 2.6 10*3/uL — ABNORMAL LOW (ref 3.9–10.3)
lymph#: 0.9 10*3/uL (ref 0.9–3.3)

## 2016-12-01 LAB — COMPREHENSIVE METABOLIC PANEL
ALT: 15 U/L (ref 0–55)
AST: 27 U/L (ref 5–34)
Albumin: 3.8 g/dL (ref 3.5–5.0)
Alkaline Phosphatase: 85 U/L (ref 40–150)
Anion Gap: 9 mEq/L (ref 3–11)
BUN: 12.7 mg/dL (ref 7.0–26.0)
CO2: 28 mEq/L (ref 22–29)
Calcium: 10.1 mg/dL (ref 8.4–10.4)
Chloride: 105 mEq/L (ref 98–109)
Creatinine: 0.9 mg/dL (ref 0.6–1.1)
EGFR: >90 mL/min/{1.73_m2} (ref >90)
Glucose: 75 mg/dl (ref 70–140)
Potassium: 4.1 mEq/L (ref 3.5–5.1)
Sodium: 141 mEq/L (ref 136–145)
Total Bilirubin: 0.26 mg/dL (ref 0.20–1.20)
Total Protein: 7.5 g/dL (ref 6.4–8.3)

## 2016-12-01 MED ORDER — TAMOXIFEN CITRATE 20 MG PO TABS: 20.0000 mg | ORAL_TABLET | Freq: Every day | ORAL | 4 refills | Status: DC

## 2016-12-01 NOTE — Progress Notes (Signed)
[name]  [phone] [phone]     ID: Candice [name] DOB: 26-Mar-1976  MR#: [id number]  DEY#:[id number]  Patient Care Team: Jonathon Jordan, MD as PCP - General (Family Medicine) Alphonsa Overall, MD as Consulting Physician (General Surgery) Magrinat, Virgie Dad, MD as Consulting Physician (Oncology) Eppie Gibson, MD as Attending Physician (Radiation Oncology) Osborne Oman, MD as Attending Physician (Obstetrics and Gynecology) Cheryll Cockayne, MD as Referring Physician (Surgical Oncology) Chauncey Cruel, MD OTHER MD:  CHIEF COMPLAINT: estrogen receptor positive breast cancer  CURRENT TREATMENT: Adjuvant radiation ongoing   BREAST CANCER HISTORY: From the original intake note:  Candice [name] [place] [facility] December, at which time a mass in the right breast was noted. She was referred for BCCCP and screening mammography was obtained, showing a possible mass in the right breast.on 04/21/2016 she underwent right diagnostic mammography with tomography and ultrasonography at the breast Center, and this confirmedan irregular spiculat in the right breast measuring 1.8 cm, which was not palpable to the mammographer. Ultrasonography confirmed an irregular hypoechoic right breast mass at the 12:30 o'clock position 4 cm from the nipple measuring 2.2 cm. The right axilla was sonographically benign.  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  Her subsequent history is detailed below  INTERVAL HISTORY: Candice [name] returns today for follow-up and treatment of her estrogen receptor positive breast cancer. She is currently receiving radiation. She is  tolerating that well. She has had no skin breakdown and no significant fatigue. In fact she exercises regularly and vigorously.  She is very concerned about weight gain. She has never weighed this much she says. Her baseline is 125 pounds and she is about 20 pounds above that. She wonders how much of that is the cancer how much of that is the steroids she received, how much of that is the fact that for some time during the chemotherapy she was not able to exercise quite as vigorously as she normally does  REVIEW OF SYSTEMS: Candice [name] continues to have grade 1 peripheral neuropathy symptoms. These have not improved significantly but also have not worsened. A detailed review of systems today was otherwise benign  PAST MEDICAL HISTORY: Past Medical History:  Diagnosis Date  . Breast cancer ([place]) 05/2016   right  . Chronic constipation   . Family history of breast cancer   . Family history of ovarian cancer   . Family history of pancreatic cancer   . Family history of prostate cancer   . IBS (irritable bowel syndrome)     PAST SURGICAL HISTORY: Past Surgical History:  Procedure Laterality Date  . BARTHOLIN CYST MARSUPIALIZATION  09/04/2002  . BARTHOLIN GLAND CYST EXCISION Left 07/13/2005  . BREAST LUMPECTOMY WITH RADIOACTIVE SEED AND SENTINEL LYMPH NODE BIOPSY Right 06/07/2016   Procedure: BREAST LUMPECTOMY WITH RADIOACTIVE SEED AND SENTINEL LYMPH NODE BIOPSY;  Surgeon: Alphonsa Overall, MD;  Location: [facility];  Service: General;  Laterality: Right;  . HYSTEROSCOPY  2016  . MYOMECTOMY  07/20/2004  . OVARIAN CYST REMOVAL Right 07/20/2004  . PORTACATH PLACEMENT N/A 07/05/2016   Procedure: INSERTION PORT-A-CATH WITH [place];  Surgeon: Alphonsa Overall, MD;  Location: [facility];  Service: General;  Laterality: N/A;  FAMILY HISTORY Family History  Problem Relation Age of Onset  . Breast cancer Mother 28  . Pancreatic cancer Paternal Grandmother   . Prostate cancer  Paternal Grandfather   . Ovarian cancer Other        MGMs sister  . Breast cancer Other        mother's maternal first cousin  The patient's father, [name], lives in [place] and works as a Geneticist, molecular. The patient's mother is a Quarry manager. The patient has no brother, one sister, who works in [employer] as a Network engineer. The patient's mother had breast cancer, stage 0, diagnosed at age 44. There are 2 maternal relatives (mother's sister and mother's niece) with breast cancer, both postmenopausal.  GYNECOLOGIC HISTORY:  Menarche age 29, the patient is GX P0. She has regular periods, most recently mid-January. She is on oral contraceptives at present. No LMP recorded.   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 She generally lives in [place] by herself although she is currently staying at her parents.    ADVANCED DIRECTIVES: Not in place. The patient tells me she intends to name her sister as her healthcare power of attorney.   HEALTH MAINTENANCE: Social History  Substance Use Topics  . Smoking status: Never Smoker  . Smokeless tobacco: Never Used  . Alcohol use 0.6 oz/week    1 Glasses of wine per week     Comment: socially     Colonoscopy: July 2016, in [place]  PAP:  Bone density: Never   Allergies  Allergen Reactions  . Paclitaxel Itching  . Amoxicillin Rash    Current Outpatient Prescriptions  Medication Sig Dispense Refill  . lidocaine-prilocaine (EMLA) cream Apply 1 application topically as needed. Apply over power port site about 30 to 60 minutes before chemotherapy. (Patient not taking: Reported on 12/01/2016) 30 g 0  . tamoxifen (NOLVADEX) 20 MG tablet Take 1 tablet (20 mg total) by mouth daily. (Patient not taking: Reported on 12/01/2016) 30 tablet 4   Current Facility-Administered  Medications  Medication Dose Route Frequency Provider Last Rate Last Dose  . sodium chloride 0.9 % injection 10 mL  10 mL Intravenous Once Magrinat, Virgie Dad, MD        OBJECTIVE: A young African-American womanWho appears well  Vitals:   12/01/16 1154  BP: (!) 150/84  Pulse: 88  Resp: 17  Temp: (!) 97.5 F (36.4 C)  SpO2: 100%     Body mass index is 24.79 kg/m.    ECOG FS:0  Sclerae unicteric, EOMs intact Oropharynx clear and moist No cervical or supraclavicular adenopathy Lungs no rales or rhonchi Heart regular rate and rhythm Abd soft, nontender, positive bowel sounds MSK no focal spinal tenderness, no upper extremity lymphedema Neuro: nonfocal, well oriented, appropriate affect Breasts: The right breast is status post lumpectomy and is currently receiving radiation. The cosmetic result is excellent. There is mild hyperpigmentation. The left breast is benign. Both axillae are benign.   LAB RESULTS:  CMP     Component Value Date/Time   NA 145 10/04/2016 1047   K 4.3 10/04/2016 1047   CO2 27 10/04/2016 1047   GLUCOSE 82 10/04/2016 1047   BUN 6.9 (L) 10/04/2016 1047   CREATININE 0.9 10/04/2016 1047   CALCIUM 9.3 10/04/2016 1047   PROT 6.4 10/04/2016 1047  ALBUMIN 3.8 10/04/2016 1047   AST 33 10/04/2016 1047   ALT 39 10/04/2016 1047   ALKPHOS 76 10/04/2016 1047   BILITOT 0.45 10/04/2016 1047    INo results found for: SPEP, UPEP  Lab Results  Component Value Date   WBC 2.6 (L) 12/01/2016   NEUTROABS 1.2 (L) 12/01/2016   HGB 13.4 12/01/2016   HCT 38.6 12/01/2016   MCV 90.8 12/01/2016   PLT 226 12/01/2016      Chemistry      Component Value Date/Time   NA 145 10/04/2016 1047   K 4.3 10/04/2016 1047   CO2 27 10/04/2016 1047   BUN 6.9 (L) 10/04/2016 1047   CREATININE 0.9 10/04/2016 1047      Component Value Date/Time   CALCIUM 9.3 10/04/2016 1047   ALKPHOS 76 10/04/2016 1047   AST 33 10/04/2016 1047   ALT 39 10/04/2016 1047   BILITOT 0.45  10/04/2016 1047       No results found for: LABCA2  No components found for: LABCA125  No results for input(s): INR in the last 168 hours.  Urinalysis No results found for: COLORURINE, APPEARANCEUR, LABSPEC, PHURINE, GLUCOSEU, HGBUR, BILIRUBINUR, KETONESUR, PROTEINUR, UROBILINOGEN, NITRITE, LEUKOCYTESUR   STUDIES: No results found.  ELIGIBLE FOR AVAILABLE RESEARCH PROTOCOL: no  ASSESSMENT: 41 y.o. [place] woman currently residing in [place], status post right breast upper inner quadrant biopsy 04/21/2016 for a clinical T2 N0, stage IIa invasive ductal carcinoma, grade 2, estrogen and progesterone receptor positive, HER-2 nonamplified, with an MIB-1 of 20%  (a) biopsy of 2 additional suspicious areas in the right breast 05/12/2016 was benign  (1) started tamoxifen 04/27/2016, Discontinued at the start of chemotherapy  (2) Oncotype DX obtained from the original biopsy showed a score of 14, predicting a 10 year risk of recurrence outside the breast of 9% if the patient's only systemic therapy is tamoxifen for 5 years. It also predicts no benefit from chemotherapy.   (3) patient is not interested in fertility preservation  (4) genetics testing 04/27/2016 through the [facility] gene panel offered by [facility] found no deleterious mutations in APC, ATM, BARD1, BMPR1A, BRCA1, BRCA2, BRIP1, CHD1, CDK4, CDKN2A, CHEK2, EPCAM (large rearrangement only), MLH1, MSH2, MSH6, MUTYH, NBN, PALB2, PMS2, PTEN, RAD51C, RAD51D, SMAD4, STK11, and TP53. Sequencing was performed for select regions of POLE and POLD1, and large rearrangement analysis was performed for select regions of GREM1.   (5) status post right lumpectomy with sentinel lymph node sampling 06/07/2016 for a pT1c pN1, stage IB invasive ductal carcinoma, grade 2, with negative margins   (6) Mammaprint sent from the final surgical sample was read as high risk, indicating a need for chemotherapy   (7)  doxorubicin and cyclophosphamide in dose dense fashion 4 to started 07/12/2016, completed 08/23/2016 followed by paclitaxel weekly 12 started 09/06/2016 (received 2 cycles), changed to Abraxane on 09/27/2016 due to reaction to Paclitaxel: Abraxane discontinued after 1 cycle because of the same reaction.  (8) adjuvant radiation to be completed 12/19/2016  (9) to resume tamoxifen at the completion of local treatment  PLAN: Candice [name] is now 6 months out from definitive surgery for her breast cancer. She is tolerating her radiation remarkably well.  I have alerted her surgeon to the time when she will finish radiation treatments so they can plan port removal sometime in October  The patient is delighted to be back to work even if only part-time. 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  She is very concerned about her weight. She is about 20 pounds up from her baseline. I have counseled her to be very persistent but also very patient because she is now menopausal and it will be much harder for her to lose the weight if she wishes to lose it. She does have an excellent diet and an excellent exercise program.  We discussed her white cell count which is on the low side and which is likely to remain low sometimes indefinitely. I reassured her that as she had a bacterial infection or count would rise as appropriate.  She will resume tamoxifen the middle of October. She will return to see me the second week in November. At that point we will broaden her follow-up interval.  She knows to call for any other problems that may develop before that visit.    Chauncey Cruel, MD   12/01/2016 12:10 PM Medical Oncology and Hematology [facility] [address] [place], [place] [address] Tel. [phone]    Fax. [phone]

## 2016-12-02 ENCOUNTER — Ambulatory Visit
Admission: RE | Admit: 2016-12-02 | Discharge: 2016-12-02 | Disposition: A | Discharge status: Home / Self Care | Payer: Medicaid Other | Source: Ambulatory Visit | Attending: Radiation Oncology | Admitting: Radiation Oncology

## 2016-12-05 ENCOUNTER — Ambulatory Visit
Admission: RE | Admit: 2016-12-05 | Discharge: 2016-12-05 | Disposition: A | Discharge status: Home / Self Care | Payer: Medicaid Other | Source: Ambulatory Visit | Attending: Radiation Oncology | Admitting: Radiation Oncology

## 2016-12-05 DIAGNOSIS — C50211 Malignant neoplasm of upper-inner quadrant of right female breast: Secondary | ICD-10-CM | POA: Diagnosis not present

## 2016-12-05 DIAGNOSIS — Z170 Estrogen receptor positive status [ER+]: Secondary | ICD-10-CM

## 2016-12-05 MED ORDER — RADIAPLEXRX EX GEL
Freq: Once | CUTANEOUS | Status: AC
  Administered 2016-12-05: 17:00:00 via TOPICAL

## 2016-12-06 ENCOUNTER — Ambulatory Visit
Admission: RE | Admit: 2016-12-06 | Discharge: 2016-12-06 | Disposition: A | Discharge status: Home / Self Care | Payer: Medicaid Other | Source: Ambulatory Visit | Attending: Radiation Oncology | Admitting: Radiation Oncology

## 2016-12-06 DIAGNOSIS — C50211 Malignant neoplasm of upper-inner quadrant of right female breast: Secondary | ICD-10-CM | POA: Diagnosis not present

## 2016-12-07 ENCOUNTER — Ambulatory Visit
Admission: RE | Admit: 2016-12-07 | Discharge: 2016-12-07 | Disposition: A | Discharge status: Home / Self Care | Payer: Medicaid Other | Source: Ambulatory Visit | Attending: Radiation Oncology | Admitting: Radiation Oncology

## 2016-12-07 ENCOUNTER — Ambulatory Visit: Payer: Medicaid Other

## 2016-12-07 DIAGNOSIS — C50211 Malignant neoplasm of upper-inner quadrant of right female breast: Secondary | ICD-10-CM | POA: Diagnosis not present

## 2016-12-08 ENCOUNTER — Ambulatory Visit: Payer: Medicaid Other

## 2016-12-08 ENCOUNTER — Ambulatory Visit
Admission: RE | Admit: 2016-12-08 | Discharge: 2016-12-08 | Disposition: A | Discharge status: Home / Self Care | Payer: Medicaid Other | Source: Ambulatory Visit | Attending: Radiation Oncology | Admitting: Radiation Oncology

## 2016-12-08 DIAGNOSIS — C50211 Malignant neoplasm of upper-inner quadrant of right female breast: Secondary | ICD-10-CM | POA: Diagnosis not present

## 2016-12-09 ENCOUNTER — Ambulatory Visit: Payer: Medicaid Other

## 2016-12-09 ENCOUNTER — Ambulatory Visit
Admission: RE | Admit: 2016-12-09 | Discharge: 2016-12-09 | Disposition: A | Discharge status: Home / Self Care | Payer: Medicaid Other | Source: Ambulatory Visit | Attending: Radiation Oncology | Admitting: Radiation Oncology

## 2016-12-09 DIAGNOSIS — C50211 Malignant neoplasm of upper-inner quadrant of right female breast: Secondary | ICD-10-CM | POA: Diagnosis not present

## 2016-12-12 ENCOUNTER — Ambulatory Visit
Admission: RE | Admit: 2016-12-12 | Discharge: 2016-12-12 | Disposition: A | Discharge status: Home / Self Care | Payer: Medicaid Other | Source: Ambulatory Visit | Attending: Radiation Oncology | Admitting: Radiation Oncology

## 2016-12-12 ENCOUNTER — Ambulatory Visit: Payer: Medicaid Other

## 2016-12-12 DIAGNOSIS — C50211 Malignant neoplasm of upper-inner quadrant of right female breast: Secondary | ICD-10-CM | POA: Diagnosis not present

## 2016-12-13 ENCOUNTER — Ambulatory Visit: Payer: Medicaid Other

## 2016-12-13 ENCOUNTER — Ambulatory Visit
Admission: RE | Admit: 2016-12-13 | Discharge: 2016-12-13 | Disposition: A | Discharge status: Home / Self Care | Payer: Medicaid Other | Source: Ambulatory Visit | Attending: Radiation Oncology | Admitting: Radiation Oncology

## 2016-12-13 DIAGNOSIS — C50211 Malignant neoplasm of upper-inner quadrant of right female breast: Secondary | ICD-10-CM | POA: Diagnosis not present

## 2016-12-14 ENCOUNTER — Ambulatory Visit: Payer: Medicaid Other

## 2016-12-14 ENCOUNTER — Ambulatory Visit
Admission: RE | Admit: 2016-12-14 | Discharge: 2016-12-14 | Disposition: A | Discharge status: Home / Self Care | Payer: Medicaid Other | Source: Ambulatory Visit | Attending: Radiation Oncology | Admitting: Radiation Oncology

## 2016-12-14 DIAGNOSIS — C50211 Malignant neoplasm of upper-inner quadrant of right female breast: Secondary | ICD-10-CM | POA: Diagnosis not present

## 2016-12-15 ENCOUNTER — Telehealth: Payer: Self-pay | Admitting: Adult Health

## 2016-12-15 ENCOUNTER — Ambulatory Visit
Admission: RE | Admit: 2016-12-15 | Discharge: 2016-12-15 | Disposition: A | Discharge status: Home / Self Care | Payer: Medicaid Other | Source: Ambulatory Visit | Attending: Radiation Oncology | Admitting: Radiation Oncology

## 2016-12-15 ENCOUNTER — Ambulatory Visit: Payer: Medicaid Other

## 2016-12-15 DIAGNOSIS — C50211 Malignant neoplasm of upper-inner quadrant of right female breast: Secondary | ICD-10-CM | POA: Diagnosis not present

## 2016-12-15 NOTE — Telephone Encounter (Signed)
Scheduled appt per 9/18 sch message - sent reminder letter to patient .

## 2016-12-16 ENCOUNTER — Ambulatory Visit
Admission: RE | Admit: 2016-12-16 | Discharge: 2016-12-16 | Disposition: A | Discharge status: Home / Self Care | Payer: Medicaid Other | Source: Ambulatory Visit | Attending: Radiation Oncology | Admitting: Radiation Oncology

## 2016-12-16 ENCOUNTER — Ambulatory Visit: Payer: Medicaid Other

## 2016-12-16 DIAGNOSIS — C50211 Malignant neoplasm of upper-inner quadrant of right female breast: Secondary | ICD-10-CM | POA: Diagnosis not present

## 2016-12-19 ENCOUNTER — Ambulatory Visit
Admission: RE | Admit: 2016-12-19 | Discharge: 2016-12-19 | Disposition: A | Discharge status: Home / Self Care | Payer: Medicaid Other | Source: Ambulatory Visit | Attending: Radiation Oncology | Admitting: Radiation Oncology

## 2016-12-19 DIAGNOSIS — C50211 Malignant neoplasm of upper-inner quadrant of right female breast: Secondary | ICD-10-CM | POA: Diagnosis not present

## 2016-12-19 DIAGNOSIS — K589 Irritable bowel syndrome without diarrhea: Secondary | ICD-10-CM | POA: Diagnosis not present

## 2016-12-19 DIAGNOSIS — Z79899 Other long term (current) drug therapy: Secondary | ICD-10-CM | POA: Diagnosis not present

## 2016-12-19 DIAGNOSIS — Z510 Encounter for antineoplastic radiation therapy: Secondary | ICD-10-CM | POA: Diagnosis present

## 2016-12-19 DIAGNOSIS — Z170 Estrogen receptor positive status [ER+]: Secondary | ICD-10-CM | POA: Insufficient documentation

## 2016-12-19 DIAGNOSIS — Z888 Allergy status to other drugs, medicaments and biological substances status: Secondary | ICD-10-CM | POA: Insufficient documentation

## 2016-12-19 DIAGNOSIS — Z880 Allergy status to penicillin: Secondary | ICD-10-CM | POA: Insufficient documentation

## 2016-12-21 ENCOUNTER — Encounter: Payer: Self-pay | Admitting: Radiation Oncology

## 2016-12-21 NOTE — Progress Notes (Signed)
  Radiation Oncology         [phone] ________________________________  Name: Candice [name] MRN: [id number]  Date: 12/21/2016  DOB: 1975/04/19  End of Treatment Note  Diagnosis:  cT2N0M0 Right Breast UIQ Invasive Ductal Carcinoma, ER/PR Positive, Her2 Negative, Grade 2. Pathologic Stage T1cN1a.   Indication for treatment:  Curative      Radiation treatment dates:   11/01/16-12/19/16  Site/dose:   1. Right Breast, axilla, SCV: 50 Gy in 25 fractions    2. Right Breast Boost: 10 Gy in 5 fractions       Beams/energy:  1. 3D, 6X, 10X    2. Electron, 9E, 12E     Narrative: The patient tolerated radiation treatment relatively well. Initially she tolerated her treatments without complaint; however, she eventually developed mild to moderate fatigue which was manageable and did not impede her daily activities. She also developed some radiation-related skin changes along the right breast, axillae, clavicular, and right upper back regions which was controlled well with Radiaplex.   Plan: The patient has completed radiation treatment. The patient will return to radiation oncology clinic for routine followup in one month. I advised them to call or return sooner if they have any questions or concerns related to their recovery or treatment.  -----------------------------------  Eppie Gibson, MD  This document serves as a record of services personally performed by Eppie Gibson, MD. It was created on his behalf by Reola Mosher, a trained medical scribe. The creation of this record is based on the scribe's personal observations and the provider's statements to them. This document has been checked and approved by the attending provider.

## 2017-01-17 ENCOUNTER — Ambulatory Visit
Admission: RE | Admit: 2017-01-17 | Discharge: 2017-01-17 | Disposition: A | Discharge status: Home / Self Care | Payer: Medicaid Other | Source: Ambulatory Visit | Attending: Radiation Oncology | Admitting: Radiation Oncology

## 2017-01-17 ENCOUNTER — Encounter: Payer: Self-pay | Admitting: Radiation Oncology

## 2017-01-17 DIAGNOSIS — Z510 Encounter for antineoplastic radiation therapy: Secondary | ICD-10-CM | POA: Diagnosis not present

## 2017-01-17 DIAGNOSIS — Z170 Estrogen receptor positive status [ER+]: Secondary | ICD-10-CM

## 2017-01-17 DIAGNOSIS — C50211 Malignant neoplasm of upper-inner quadrant of right female breast: Secondary | ICD-10-CM

## 2017-01-17 NOTE — Progress Notes (Signed)
  Radiation Oncology         [phone] ________________________________  Name: Candice [name] MRN: [id number]  Date: 01/17/2017  DOB: 1975/05/31  Follow-Up Visit Note  Outpatient  CC: Jonathon Jordan, MD  Magrinat, Virgie Dad, MD  Diagnosis and Prior Radiotherapy:    ICD-10-CM   1. Malignant neoplasm of upper-inner quadrant of right breast in female, estrogen receptor positive ([place]) C50.211    Z17.0     CHIEF COMPLAINT: Here for follow-up and surveillance of right breast cancer  Narrative:  The patient returns today for routine follow-up.  Patient reports of weight gain. She endorses Tamoxifen and believes this contributes to weight gain. She also endorses IBS and reports that she uses medication to encourage bowel movements.                        ALLERGIES:  is allergic to paclitaxel and amoxicillin.  Meds: Current Outpatient Prescriptions  Medication Sig Dispense Refill  . tamoxifen (NOLVADEX) 20 MG tablet Take 1 tablet (20 mg total) by mouth daily. 30 tablet 4   No current facility-administered medications for this encounter.     Physical Findings: The patient is in no acute distress. Patient is alert and oriented. Mild hyperpigmentation remaining along upper back, right breast, and supraclavicular region.  height is 5\' 4"  (1.626 m) and weight is 148 lb 14.4 oz (67.5 kg). Her temperature is 98 F (36.7 C). Her blood pressure is 106/70 and her pulse is 67. Her oxygen saturation is 100%. .      Lab Findings: Lab Results  Component Value Date   WBC 2.6 (L) 12/01/2016   HGB 13.4 12/01/2016   HCT 38.6 12/01/2016   MCV 90.8 12/01/2016   PLT 226 12/01/2016    Radiographic Findings: No results found.  Impression/Plan: healing well.  I encouraged her to continue with yearly mammography and followup with medical oncology. I will see her back on an as-needed basis. I have encouraged her to call if she has any issues or concerns in the future. I wished her the very  best. We talked about resuming exercise for quality of life and improved prognosis. Encouraged her to use lotions with Vitamin E in the treatment fields to heal her skin..    _____________________________________   Eppie Gibson, MD  This document serves as a record of services personally performed by Eppie Gibson, MD. It was created on his behalf by Linward Natal, a trained medical scribe. The creation of this record is based on the scribe's personal observations and the provider's statements to them. This document has been checked and approved by the attending provider.

## 2017-01-17 NOTE — Progress Notes (Signed)
Ms. Couvillon presents for follow up of radiation completed 12/19/16 to her Right Breast. She denies pain. She has fatigue which she relates to Tamoxifen. She is exercising at the [facility]. She has an appointment with Dr. Jana Hakim on 02/15/17. She has a survivorship appointment on 05/18/17. Her Right Breast has hyperpigmentation present. She is completing the Radiaplex she was given, and she will begin using a Vitamin E cream when completed.   BP 106/70   Pulse 67   Temp 98 F (36.7 C)   Ht 5\' 4"  (1.626 m)   Wt 148 lb 14.4 oz (67.5 kg)   SpO2 100% Comment: room air  BMI 25.56 kg/m    Wt Readings from Last 3 Encounters:  01/17/17 148 lb 14.4 oz (67.5 kg)  12/01/16 144 lb 6.4 oz (65.5 kg)  10/19/16 141 lb (64 kg)

## 2017-01-31 ENCOUNTER — Ambulatory Visit (HOSPITAL_COMMUNITY)
Admission: RE | Admit: 2017-01-31 | Discharge: 2017-01-31 | Disposition: A | Discharge status: Home / Self Care | Payer: Medicaid Other | Source: Ambulatory Visit | Attending: Internal Medicine | Admitting: Internal Medicine

## 2017-01-31 ENCOUNTER — Encounter (HOSPITAL_COMMUNITY): Payer: Self-pay | Admitting: Internal Medicine

## 2017-01-31 ENCOUNTER — Ambulatory Visit (HOSPITAL_BASED_OUTPATIENT_CLINIC_OR_DEPARTMENT_OTHER)
Admission: RE | Admit: 2017-01-31 | Discharge: 2017-01-31 | Disposition: A | Discharge status: Home / Self Care | Payer: Medicaid Other | Source: Ambulatory Visit | Attending: Internal Medicine | Admitting: Internal Medicine

## 2017-01-31 VITALS — BP 118/78 | HR 70 | Wt 149.0 lb

## 2017-01-31 DIAGNOSIS — Z79899 Other long term (current) drug therapy: Secondary | ICD-10-CM | POA: Diagnosis not present

## 2017-01-31 DIAGNOSIS — Z923 Personal history of irradiation: Secondary | ICD-10-CM | POA: Diagnosis not present

## 2017-01-31 DIAGNOSIS — Z170 Estrogen receptor positive status [ER+]: Secondary | ICD-10-CM

## 2017-01-31 DIAGNOSIS — Z808 Family history of malignant neoplasm of other organs or systems: Secondary | ICD-10-CM | POA: Insufficient documentation

## 2017-01-31 DIAGNOSIS — Z880 Allergy status to penicillin: Secondary | ICD-10-CM | POA: Insufficient documentation

## 2017-01-31 DIAGNOSIS — C50211 Malignant neoplasm of upper-inner quadrant of right female breast: Secondary | ICD-10-CM

## 2017-01-31 DIAGNOSIS — K5909 Other constipation: Secondary | ICD-10-CM | POA: Insufficient documentation

## 2017-01-31 DIAGNOSIS — Z803 Family history of malignant neoplasm of breast: Secondary | ICD-10-CM | POA: Diagnosis not present

## 2017-01-31 DIAGNOSIS — Z8042 Family history of malignant neoplasm of prostate: Secondary | ICD-10-CM | POA: Insufficient documentation

## 2017-01-31 DIAGNOSIS — Z8041 Family history of malignant neoplasm of ovary: Secondary | ICD-10-CM | POA: Diagnosis not present

## 2017-01-31 LAB — ECHOCARDIOGRAM COMPLETE
E decel time: 225 msec
E/e' ratio: 4.66
FS: 33 % (ref 28–44)
IVS/LV PW RATIO, ED: 0.96
LA ID, A-P, ES: 32 mm
LA diam end sys: 32 mm
LA diam index: 1.85 cm/m2
LA vol A4C: 38.6 ml
LA vol index: 25.8 mL/m2
LA vol: 44.7 mL
LV E/e' medial: 4.66
LV E/e'average: 4.66
LV PW d: 6.83 mm — AB (ref 0.6–1.1)
LV e' LATERAL: 18.1 cm/s
LVOT SV: 61 mL
LVOT VTI: 21.6 cm
LVOT area: 2.84 cm2
LVOT diameter: 19 mm
LVOT peak grad rest: 3 mmHg
LVOT peak vel: 91.4 cm/s
Lateral S' vel: 12.7 cm/s
MV Dec: 225
MV Peak grad: 3 mmHg
MV pk A vel: 58.7 m/s
MV pk E vel: 84.4 m/s
RV sys press: 27 mmHg
Reg peak vel: 244 cm/s
TAPSE: 22.9 mm
TDI e' lateral: 18.1
TDI e' medial: 8.92
TR max vel: 244 cm/s

## 2017-01-31 NOTE — Patient Instructions (Signed)
Follow up as needed

## 2017-01-31 NOTE — Progress Notes (Signed)
  Echocardiogram 2D Echocardiogram has been performed.  Candice [name] 01/31/2017, 1:55 PM

## 2017-01-31 NOTE — Progress Notes (Signed)
CARDIO-ONCOLOGY CLINIC NOTE  Referring Physician: Magrinat   HPI:  41 y/o woman with PhD in environmental science and policy with right breast cancer referred by Dr. Jana Hakim for enrollment into the [facility].   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  No PMHx except for constipation for which she has had very thorough work-up and told it was stress. Denies any h/o heart disease.  Found to have R breast mass on routine exam with her PCP.   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  Treatment regimen:  (1) started tamoxifen 04/27/2016, Discontinued at the start of chemotherapy  (5) status post right lumpectomy with sentinel lymph node sampling 06/07/2016 for apT1c pN1, stage IB invasive ductal carcinoma, grade 2, with negative margins   (7) doxorubicin and cyclophosphamide in dose dense fashion 4 to started 07/12/2016 followed by paclitaxel weekly 12  (8) finished XRT in 918  (9) resumed tamoxifen   Has finished all chemo and XRT. Feels great. Back at gym without any problem.    Echo 01/31/17 EF 55-60% GLS -20.9%  Echo (07/11/16): EF 55-60% Lateral s' unreadable. GLS - 22.6% Echo 5/24/18L EF 60-65% Lateral s 11.7 cm/s GLS - 25.2% Personally reviewed  Past Medical History:  Diagnosis Date  . Breast cancer ([place]) 05/2016   right  . Chronic constipation   . Family history of breast cancer   . Family history of ovarian cancer   . Family history of pancreatic cancer   . Family history of prostate cancer   . IBS (irritable bowel syndrome)     Current Outpatient Medications  Medication Sig Dispense Refill  . tamoxifen (NOLVADEX) 20 MG tablet Take 1 tablet (20 mg total) by mouth daily. 30 tablet 4   No  current facility-administered medications for this encounter.     Allergies  Allergen Reactions  . Paclitaxel Itching  . Amoxicillin Rash      Social History   Socioeconomic History  . Marital status: Single    Spouse name: Not on file  . Number of children: Not on file  . Years of education: Not on file  . Highest education level: Not on file  Social Needs  . Financial resource strain: Not on file  . Food insecurity - worry: Not on file  . Food insecurity - inability: Not on file  . Transportation needs - medical: Not on file  . Transportation needs - non-medical: Not on file  Occupational History  . Not on file  Tobacco Use  . Smoking status: Never Smoker  . Smokeless tobacco: Never Used  Substance and Sexual Activity  . Alcohol use: Yes    Alcohol/week: 0.6 oz    Types: 1 Glasses of wine per week    Comment: socially  . Drug use: No  . Sexual activity: Not Currently    Partners: Male    Birth control/protection: None  Other Topics Concern  . Not on file  Social History Narrative  . Not on file      Family History  Problem Relation Age of Onset  . Breast cancer Mother 6  . Pancreatic cancer Paternal Grandmother   . Prostate cancer Paternal Grandfather   . Ovarian cancer Other        MGMs sister  .  Breast cancer Other        mother's maternal first cousin    Vitals:   01/31/17 1405  BP: 118/78  Pulse: 70  SpO2: 98%  Weight: 149 lb (67.6 kg)    PHYSICAL EXAM: General:  Well appearing. No resp difficulty HEENT: normal Neck: supple. no JVD. Carotids 2+ bilat; no bruits. No lymphadenopathy or thryomegaly appreciated. Cor: PMI nondisplaced. Regular rate & rhythm. No rubs, gallops or murmurs. Port site ok  Lungs: clear Abdomen: soft, nontender, nondistended. No hepatosplenomegaly. No bruits or masses. Good bowel sounds. Extremities: no cyanosis, clubbing, rash, edema Neuro: alert & orientedx3, cranial nerves grossly intact. moves all 4 extremities  w/o difficulty. Affect pleasant   ASSESSMENT & PLAN:  1. Right breast CA   - path; invasive ductal carcinoma, grade 2, estrogen receptor 95% positive, progesterone receptor 100% positive, with strong staining intensity, with an MIB-1 of 20%, and no HER-2 amplification, the signals ratio being 1.71 and the number per cell 3.00.  - now s/p lumpectomy and XRT -  I reviewed echo personally. EF and Doppler parameters stable. No evidence of adriamycin toxicity. I explained incidence of Adriamycin cardiotoxicity in detail include small possibility of very delayed toxicity.   - Can f/u PRN      Glori Bickers, MD  2:54 PM

## 2017-02-09 NOTE — Progress Notes (Signed)
[name]  [phone] Fax:([phone]) [phone]     ID: Candice [name] DOB: December 28, 1975  MR#: [id number]  SHF#:[id number]  Patient Care Team: Candice Jordan, MD as PCP - General (Family Medicine) Candice Overall, MD as Consulting Physician (General Surgery) Magrinat, Virgie Dad, MD as Consulting Physician (Oncology) Candice Gibson, MD as Attending Physician (Radiation Oncology) Candice Oman, MD as Attending Physician (Obstetrics and Gynecology) Candice Cockayne, MD as Referring Physician (Surgical Oncology) OTHER MD:  CHIEF COMPLAINT: estrogen receptor positive breast cancer  CURRENT TREATMENT: Tamoxifen   BREAST CANCER HISTORY: From the original intake note:  Candice [name] [facility] [place] in December, at which time a mass in the right breast was noted. She was referred for BCCCP and screening mammography was obtained, showing a possible mass in the right breast.on 04/21/2016 she underwent right diagnostic mammography with tomography and ultrasonography at the breast Center, and this confirmedan irregular spiculat in the right breast measuring 1.8 cm, which was not palpable to the mammographer. Ultrasonography confirmed an irregular hypoechoic right breast mass at the 12:30 o'clock position 4 cm from the nipple measuring 2.2 cm. The right axilla was sonographically benign.  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  Her subsequent history is detailed below  INTERVAL HISTORY: Candice [name] returns today for follow-up and treatment of her estrogen receptor positive breast cancer. She is accompanied by her sister. Since her last visit here she completed her radiation  treatments. She reports tolerating treatment well. She used Radiaplex as prescribed and did not notice any skin peeling, but she did notice skin darkening.  She then resumed tamoxifen, on 12/26/2016. She reports that she does experience occasional hot flashes, but notes they are not as bed as when she first started back in January. She thinks she might be having a yeast infection and bought OTC treatment. She denies vaginal dryness. She has not had her menstrual cycle in months.    REVIEW OF SYSTEMS: Candice [name] reports that she had her port-a-cath removed about 2 weeks ago. She has not seen her Gynecologist in a while. 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 She denies unusual headaches, visual changes, nausea, vomiting, or dizziness. There has been no unusual cough, phlegm production, or pleurisy. This been no change in bowel or bladder habits. She denies unexplained fatigue or unexplained weight loss, bleeding, rash, or fever. A detailed review of systems was otherwise entirely stable.    PAST MEDICAL HISTORY: Past Medical History:  Diagnosis Date  . Breast cancer ([place]) 05/2016   right  . Chronic constipation   . Family history of breast cancer   . Family history of ovarian cancer   . Family history of pancreatic cancer   . Family history of prostate cancer   . IBS (irritable bowel syndrome)     PAST SURGICAL HISTORY: Past Surgical History:  Procedure Laterality Date  . BARTHOLIN CYST MARSUPIALIZATION  09/04/2002  . BARTHOLIN GLAND CYST EXCISION Left 07/13/2005  . BREAST LUMPECTOMY WITH RADIOACTIVE SEED AND SENTINEL LYMPH NODE BIOPSY Right 06/07/2016   Procedure: BREAST LUMPECTOMY WITH RADIOACTIVE SEED AND SENTINEL LYMPH NODE BIOPSY;  Surgeon: Candice Overall, MD;  Location: [facility];  Service: General;  Laterality: Right;  . HYSTEROSCOPY  2016  . MYOMECTOMY  07/20/2004  . OVARIAN CYST REMOVAL Right 07/20/2004    . PORTACATH PLACEMENT N/A 07/05/2016   Procedure: INSERTION PORT-A-CATH WITH [place];  Surgeon: Candice Overall, MD;  Location: [facility];  Service: General;  Laterality: N/A;    FAMILY HISTORY Family History  Problem Relation Age of Onset  . Breast cancer Mother 30  . Pancreatic cancer Paternal Grandmother   . Prostate cancer Paternal Grandfather   . Ovarian cancer Other        MGMs sister  . Breast cancer Other        mother's maternal first cousin  The patient's father, Candice [name], lives in [place] and works as a Geneticist, molecular. The patient's mother is a Quarry manager. The patient has no brother, one sister, who works in [place] as a Network engineer. The patient's mother had breast cancer, stage 0, diagnosed at age 91. There are 2 maternal relatives (mother's sister and mother's niece) with breast cancer, both postmenopausal.  GYNECOLOGIC HISTORY:  Menarche age 47, the patient is GX P0. She has regular periods, most recently mid-January. She is on oral contraceptives at present. No LMP recorded.   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 She generally lives in [place] by herself although she is currently staying at her parents.    ADVANCED DIRECTIVES: Not in place. The patient tells me she intends to name her sister as her healthcare power of attorney.   HEALTH MAINTENANCE: Social History   Tobacco Use  . Smoking status: Never Smoker  . Smokeless tobacco: Never Used  Substance Use Topics  . Alcohol use: Yes    Alcohol/week: 0.6 oz    Types: 1 Glasses of wine per week    Comment: socially  . Drug use: No     Colonoscopy: July 2016, in [place]  PAP:  Bone density: Never   Allergies  Allergen Reactions  . Paclitaxel Itching  . Amoxicillin Rash    Current Outpatient Medications   Medication Sig Dispense Refill  . tamoxifen (NOLVADEX) 20 MG tablet Take 1 tablet (20 mg total) by mouth daily. 30 tablet 4   No current facility-administered medications for this visit.     OBJECTIVE: A young African-American woman in no acute distress  Vitals:   02/15/17 1121  BP: 110/89  Pulse: 93  Resp: 20  Temp: 97.8 F (36.6 C)  SpO2: 100%     Body mass index is 25.66 kg/m.    ECOG FS:0  Sclerae unicteric, pupils round and equal Oropharynx clear and moist No cervical or supraclavicular adenopathy Lungs no rales or rhonchi Heart regular rate and rhythm Abd soft, nontender, positive bowel sounds MSK no focal spinal tenderness, no right upper extremity lymphedema Neuro: nonfocal, well oriented, appropriate affect Breasts: The right breast is undergone lumpectomy and radiation.  There is minimal hyperpigmentation residual.  The left breast is benign.  Both axillae are benign. Skin: The incision where port has recently been removed is healing nicely, without erythema or swelling   LAB RESULTS:  CMP     Component Value Date/Time   NA 141 12/01/2016 1139   K 4.1 12/01/2016 1139   CO2 28 12/01/2016 1139   GLUCOSE 75 12/01/2016 1139   BUN 12.7 12/01/2016 1139  CREATININE 0.9 12/01/2016 1139   CALCIUM 10.1 12/01/2016 1139   PROT 7.5 12/01/2016 1139   ALBUMIN 3.8 12/01/2016 1139   AST 27 12/01/2016 1139   ALT 15 12/01/2016 1139   ALKPHOS 85 12/01/2016 1139   BILITOT 0.26 12/01/2016 1139    INo results found for: SPEP, UPEP  Lab Results  Component Value Date   WBC 2.9 (L) 02/15/2017   NEUTROABS 1.5 02/15/2017   HGB 13.7 02/15/2017   HCT 39.7 02/15/2017   MCV 91.5 02/15/2017   PLT 209 02/15/2017      Chemistry      Component Value Date/Time   NA 141 12/01/2016 1139   K 4.1 12/01/2016 1139   CO2 28 12/01/2016 1139   BUN 12.7 12/01/2016 1139   CREATININE 0.9 12/01/2016 1139      Component Value Date/Time   CALCIUM 10.1 12/01/2016 1139   ALKPHOS 85  12/01/2016 1139   AST 27 12/01/2016 1139   ALT 15 12/01/2016 1139   BILITOT 0.26 12/01/2016 1139       No results found for: LABCA2  No components found for: LABCA125  No results for input(s): INR in the last 168 hours.  Urinalysis No results found for: COLORURINE, APPEARANCEUR, LABSPEC, PHURINE, GLUCOSEU, HGBUR, BILIRUBINUR, KETONESUR, PROTEINUR, UROBILINOGEN, NITRITE, LEUKOCYTESUR   STUDIES: Repeat mammography is due January 2019 ELIGIBLE FOR AVAILABLE RESEARCH PROTOCOL: no  ASSESSMENT: 41 y.o. Candice [name] woman currently residing in [place], status post right breast upper inner quadrant biopsy 04/21/2016 for a clinical T2 N0, stage IIa invasive ductal carcinoma, grade 2, estrogen and progesterone receptor positive, HER-2 nonamplified, with an MIB-1 of 20%  (a) biopsy of 2 additional suspicious areas in the right breast 05/12/2016 was benign  (1) started tamoxifen 04/27/2016, Discontinued at the start of chemotherapy  (2) Oncotype DX obtained from the original biopsy showed a score of 14, predicting a 10 year risk of recurrence outside the breast of 9% if the patient's only systemic therapy is tamoxifen for 5 years. It also predicts no benefit from chemotherapy.   (3) patient is not interested in fertility preservation  (4) genetics testing 04/27/2016 through the [facility] gene panel offered by [facility] found no deleterious mutations in APC, ATM, BARD1, BMPR1A, BRCA1, BRCA2, BRIP1, CHD1, CDK4, CDKN2A, CHEK2, EPCAM (large rearrangement only), MLH1, MSH2, MSH6, MUTYH, NBN, PALB2, PMS2, PTEN, RAD51C, RAD51D, SMAD4, STK11, and TP53. Sequencing was performed for select regions of POLE and POLD1, and large rearrangement analysis was performed for select regions of GREM1.   (5) status post right lumpectomy with sentinel lymph node sampling 06/07/2016 for a pT1c pN1, stage IB invasive ductal carcinoma, grade 2, with negative margins   (6) Mammaprint sent  from the final surgical sample was read as high risk, indicating a need for chemotherapy   (7) doxorubicin and cyclophosphamide in dose dense fashion 4 to started 07/12/2016, completed 08/23/2016 followed by paclitaxel weekly 12 started 09/06/2016 (received 2 cycles), changed to Abraxane on 09/27/2016 due to reaction to Paclitaxel: Abraxane discontinued after 1 cycle because of the same reaction.  (8) adjuvant radiation 11/01/16-12/19/16 Site/dose:   1. Right Breast: 50 Gy in 25 fractions                          2. Right Breast Boost: 10 Gy in 5 fractions  3. Right Breast SCV: 50 Gy in 25 fractions    (9) resumed tamoxifen 12/26/2016, to be continued 5/10 years  (10) mammography with tomography due January 2019  (11) next follow-up visit February 2019, to be repeated every 3 months for the next year  PLAN: Candice [name] is now 8 months out from definitive surgery for her breast cancer.  She has completed her chemotherapy and radiation and has gone back on tamoxifen, with moderately good tolerance.  The plan is to continue tamoxifen for a minimum of 5 years, maximum of 10.  Her periods have not recurred.  Hopefully she is permanently menopausal and that would be her preference.  However tamoxifen can "hide" periods.  I am going to check an [facility] and estradiol before her return visit here to document that.  If her periods resume she will need goserelin for at least 2 years  She understood sister wanted to know if we should do a PET scan.  We discussed issues regarding radiation exposure and false positives.  They understand that the standard of care is not to do scans in patients like her in the absence of specific symptoms to evaluate.  They are agreeable to this plan  Today I wrote her a prescription for compression sleeves and a letter she can use to see if her Medicaid can be extended  She will be due for mammography in January I have entered that order.  She will see me  again in February.  She knows to call for any problems that may develop before that visit.   Magrinat, Virgie Dad, MD  02/15/17 11:36 AM Medical Oncology and Hematology [facility] [address] [place], [place] [address] Tel. [phone]    Fax. [phone]   This document serves as a record of services personally performed by Chauncey Cruel, MD. It was created on his behalf by Margit Banda, a trained medical scribe. The creation of this record is based on the scribe's personal observations and the provider's statements to them.   I have reviewed the above documentation for accuracy and completeness, and I agree with the above.

## 2017-02-15 ENCOUNTER — Ambulatory Visit (HOSPITAL_BASED_OUTPATIENT_CLINIC_OR_DEPARTMENT_OTHER): Payer: Medicaid Other | Admitting: Oncology

## 2017-02-15 ENCOUNTER — Other Ambulatory Visit (HOSPITAL_BASED_OUTPATIENT_CLINIC_OR_DEPARTMENT_OTHER): Payer: Medicaid Other

## 2017-02-15 ENCOUNTER — Encounter: Payer: Self-pay | Admitting: Oncology

## 2017-02-15 VITALS — BP 110/89 | HR 93 | Temp 97.8°F | Resp 20 | Ht 64.0 in | Wt 149.5 lb

## 2017-02-15 DIAGNOSIS — Z79810 Long term (current) use of selective estrogen receptor modulators (SERMs): Secondary | ICD-10-CM | POA: Diagnosis not present

## 2017-02-15 DIAGNOSIS — C50211 Malignant neoplasm of upper-inner quadrant of right female breast: Secondary | ICD-10-CM

## 2017-02-15 DIAGNOSIS — Z170 Estrogen receptor positive status [ER+]: Secondary | ICD-10-CM | POA: Diagnosis not present

## 2017-02-15 LAB — COMPREHENSIVE METABOLIC PANEL
ALT: 15 U/L (ref 0–55)
AST: 25 U/L (ref 5–34)
Albumin: 3.8 g/dL (ref 3.5–5.0)
Alkaline Phosphatase: 62 U/L (ref 40–150)
Anion Gap: 9 mEq/L (ref 3–11)
BUN: 12.3 mg/dL (ref 7.0–26.0)
CO2: 25 mEq/L (ref 22–29)
Calcium: 9.6 mg/dL (ref 8.4–10.4)
Chloride: 109 mEq/L (ref 98–109)
Creatinine: 0.9 mg/dL (ref 0.6–1.1)
EGFR: >60 mL/min/{1.73_m2} (ref >60)
Glucose: 71 mg/dl (ref 70–140)
Potassium: 4.1 mEq/L (ref 3.5–5.1)
Sodium: 142 mEq/L (ref 136–145)
Total Bilirubin: 0.6 mg/dL (ref 0.20–1.20)
Total Protein: 7 g/dL (ref 6.4–8.3)

## 2017-02-15 LAB — CBC WITH DIFFERENTIAL/PLATELET
BASO%: 0.3 % (ref 0.0–2.0)
Basophils Absolute: 0 10*3/uL (ref 0.0–0.1)
EOS%: 2.1 % (ref 0.0–7.0)
Eosinophils Absolute: 0.1 10*3/uL (ref 0.0–0.5)
HCT: 39.7 % (ref 34.8–46.6)
HGB: 13.7 g/dL (ref 11.6–15.9)
LYMPH%: 38.6 % (ref 14.0–49.7)
MCH: 31.6 pg (ref 25.1–34.0)
MCHC: 34.5 g/dL (ref 31.5–36.0)
MCV: 91.5 fL (ref 79.5–101.0)
MONO#: 0.2 10*3/uL (ref 0.1–0.9)
MONO%: 5.9 % (ref 0.0–14.0)
NEUT#: 1.5 10*3/uL (ref 1.5–6.5)
NEUT%: 53.1 % (ref 38.4–76.8)
Platelets: 209 10*3/uL (ref 145–400)
RBC: 4.34 10*6/uL (ref 3.70–5.45)
RDW: 13.4 % (ref 11.2–14.5)
WBC: 2.9 10*3/uL — ABNORMAL LOW (ref 3.9–10.3)
lymph#: 1.1 10*3/uL (ref 0.9–3.3)

## 2017-02-20 ENCOUNTER — Other Ambulatory Visit: Payer: Self-pay | Admitting: Oncology

## 2017-02-20 MED ORDER — FLUCONAZOLE 100 MG PO TABS: 100.0000 mg | ORAL_TABLET | Freq: Every day | ORAL | 0 refills | Status: DC

## 2017-02-20 NOTE — Progress Notes (Unsigned)
Di

## 2017-03-01 ENCOUNTER — Encounter: Payer: Self-pay | Admitting: Obstetrics

## 2017-03-01 ENCOUNTER — Telehealth: Payer: Self-pay | Admitting: Oncology

## 2017-03-01 ENCOUNTER — Encounter (HOSPITAL_COMMUNITY): Payer: Self-pay | Admitting: *Deleted

## 2017-03-01 NOTE — Telephone Encounter (Signed)
Spoke to patient re 03/2017 appt. Schedule mailed

## 2017-03-13 ENCOUNTER — Telehealth: Payer: Self-pay

## 2017-03-13 NOTE — Telephone Encounter (Signed)
Pt called 12/14 inquiring about 1/28 lab appt, 2/6 magrinat appt and 2/21 appt with LC.  Pt wants to know if she needs to see [place] if she is going to see Dr Jana Hakim.  Per Dr Hortencia Pilar in basket msg - keep appt with [facility] and reschedule appt to see him in March.  In basket msg sent to schedulers.    This RN spoke with pt by phone to let her know 1/28 and 2/6 appt's to be rescheduled and to anticipate a call from schedulers.

## 2017-03-13 NOTE — Telephone Encounter (Signed)
Spoke with patient per 03/13/17 inbasket and her appt has been r/s  [employer]

## 2017-03-29 ENCOUNTER — Ambulatory Visit
Admission: RE | Admit: 2017-03-29 | Discharge: 2017-03-29 | Disposition: A | Discharge status: Home / Self Care | Payer: Medicaid Other | Source: Ambulatory Visit | Attending: Oncology | Admitting: Oncology

## 2017-03-29 DIAGNOSIS — Z170 Estrogen receptor positive status [ER+]: Secondary | ICD-10-CM

## 2017-03-29 DIAGNOSIS — C50211 Malignant neoplasm of upper-inner quadrant of right female breast: Secondary | ICD-10-CM

## 2017-03-29 HISTORY — DX: Personal history of irradiation: Z92.3

## 2017-03-29 HISTORY — DX: Personal history of antineoplastic chemotherapy: Z92.21

## 2017-04-24 ENCOUNTER — Other Ambulatory Visit: Payer: Medicaid Other

## 2017-04-27 ENCOUNTER — Inpatient Hospital Stay: Payer: Medicaid Other | Attending: Adult Health | Admitting: Adult Health

## 2017-04-27 ENCOUNTER — Telehealth: Payer: Self-pay | Admitting: Adult Health

## 2017-04-27 ENCOUNTER — Encounter: Payer: Self-pay | Admitting: Adult Health

## 2017-04-27 VITALS — BP 95/77 | HR 73 | Temp 98.1°F | Resp 18 | Ht 64.0 in | Wt 153.9 lb

## 2017-04-27 DIAGNOSIS — Z79810 Long term (current) use of selective estrogen receptor modulators (SERMs): Secondary | ICD-10-CM

## 2017-04-27 DIAGNOSIS — Z170 Estrogen receptor positive status [ER+]: Secondary | ICD-10-CM

## 2017-04-27 DIAGNOSIS — K589 Irritable bowel syndrome without diarrhea: Secondary | ICD-10-CM | POA: Insufficient documentation

## 2017-04-27 DIAGNOSIS — Z9221 Personal history of antineoplastic chemotherapy: Secondary | ICD-10-CM | POA: Diagnosis not present

## 2017-04-27 DIAGNOSIS — C50211 Malignant neoplasm of upper-inner quadrant of right female breast: Secondary | ICD-10-CM | POA: Diagnosis not present

## 2017-04-27 DIAGNOSIS — Z923 Personal history of irradiation: Secondary | ICD-10-CM | POA: Diagnosis not present

## 2017-04-27 DIAGNOSIS — K5909 Other constipation: Secondary | ICD-10-CM | POA: Insufficient documentation

## 2017-04-27 DIAGNOSIS — I890 Lymphedema, not elsewhere classified: Secondary | ICD-10-CM

## 2017-04-27 DIAGNOSIS — M7989 Other specified soft tissue disorders: Secondary | ICD-10-CM

## 2017-04-27 NOTE — Progress Notes (Addendum)
[place]  Telephone:(336) [phone] Fax:(336) [phone]     ID: Candice [name] DOB: 05/12/1975  MR#: [id number]  SUP#:[id number]  Patient Care Team: Jonathon Jordan, MD as PCP - General (Family Medicine) Alphonsa Overall, MD as Consulting Physician (General Surgery) Magrinat, Virgie Dad, MD as Consulting Physician (Oncology) Eppie Gibson, MD as Attending Physician (Radiation Oncology) Osborne Oman, MD as Attending Physician (Obstetrics and Gynecology) Cheryll Cockayne, MD as Referring Physician (Surgical Oncology) OTHER MD:  CHIEF COMPLAINT: estrogen receptor positive breast cancer  CURRENT TREATMENT: Tamoxifen   BREAST CANCER HISTORY: From the original intake note:  Candice [name] was evaluated at the [facility] in [place] in December, at which time a mass in the right breast was noted. She was referred for [facility] and screening mammography was obtained, showing a possible mass in the right breast.on 04/21/2016 she underwent right diagnostic mammography with tomography and ultrasonography at the breast Center, and this confirmedan irregular spiculat in the right breast measuring 1.8 cm, which was not palpable to the mammographer. Ultrasonography confirmed an irregular hypoechoic right breast mass at the 12:30 o'clock position 4 cm from the nipple measuring 2.2 cm. The right axilla was sonographically benign.  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  Her subsequent history is detailed below  INTERVAL HISTORY: Candice [name] is here today for evaluation of her right arm.  She is concerned that she may be getting lymphedema in her arm.  She notes some discomfort and swelling under her right axilla as well.   This has been present for a couple of weeks, and isn't improving.  She is accompanied by her sister today.     REVIEW OF SYSTEMS: Candice [name] continues to take Tamoxifen daily and is tolerating it well.  She notes that she started her period twice in January.  She continues to exercise with a sleeve.  She has IBS issues, and has stopped using the super colon cleanse.  Her sister has started cooking for her plenty of vegetables, and the patient's bowels are much more regular now.  She had her mammogram earlier this month, on January 2, and it was normal.  Otherwise she is doing well today and a detailed ROS is non contributory.    PAST MEDICAL HISTORY: Past Medical History:  Diagnosis Date  . Breast cancer ([place]) 05/2016   right  . Chronic constipation   . Family history of breast cancer   . Family history of ovarian cancer   . Family history of pancreatic cancer   . Family history of prostate cancer   . IBS (irritable bowel syndrome)   . Personal history of chemotherapy   . Personal history of radiation therapy     PAST SURGICAL HISTORY: Past Surgical History:  Procedure Laterality Date  . BARTHOLIN CYST MARSUPIALIZATION  09/04/2002  . BARTHOLIN GLAND CYST EXCISION Left 07/13/2005  . BREAST LUMPECTOMY Right   . BREAST LUMPECTOMY WITH RADIOACTIVE SEED AND SENTINEL LYMPH NODE BIOPSY Right 06/07/2016   Procedure: BREAST LUMPECTOMY WITH RADIOACTIVE SEED AND SENTINEL LYMPH NODE BIOPSY;  Surgeon: Alphonsa Overall, MD;  Location: [facility];  Service: General;  Laterality: Right;  . HYSTEROSCOPY  2016  . MYOMECTOMY  07/20/2004  . OVARIAN CYST REMOVAL Right 07/20/2004  . PORTACATH PLACEMENT  N/A 07/05/2016   Procedure: INSERTION PORT-A-CATH WITH [place];  Surgeon: Alphonsa Overall, MD;  Location: [facility];  Service: General;  Laterality: N/A;    FAMILY HISTORY Family History  Problem Relation Age of Onset  . Breast cancer Mother 33  . Pancreatic cancer Paternal Grandmother     . Prostate cancer Paternal Grandfather   . Ovarian cancer Other        MGMs sister  . Breast cancer Other        mother's maternal first cousin  The patient's father, [name], lives in [place] and works as a Geneticist, molecular. The patient's mother is a Quarry manager. The patient has no brother, one sister, who works in [place] as a Network engineer. The patient's mother had breast cancer, stage 0, diagnosed at age 38. There are 2 maternal relatives (mother's sister and mother's niece) with breast cancer, both postmenopausal.  GYNECOLOGIC HISTORY:  Menarche age 35, the patient is GX P0. She has regular periods, most recently mid-January. She is on oral contraceptives at present. No LMP recorded. Patient is not currently having periods (Reason: Chemotherapy).   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 She generally lives in [place] by herself although she is currently staying at her parents.    ADVANCED DIRECTIVES: Not in place. The patient tells me she intends to name her sister as her healthcare power of attorney.   HEALTH MAINTENANCE: Social History   Tobacco Use  . Smoking status: Never Smoker  . Smokeless tobacco: Never Used  Substance Use Topics  . Alcohol use: Yes    Alcohol/week: 0.6 oz    Types: 1 Glasses of wine per week    Comment: socially  . Drug use: No     Colonoscopy: July 2016, in [place]  PAP:  Bone density: Never   Allergies  Allergen Reactions  . Paclitaxel Itching  . Amoxicillin Rash    Current Outpatient Medications  Medication Sig Dispense Refill  . tamoxifen (NOLVADEX) 20 MG tablet Take 1 tablet (20 mg total) by mouth daily. 30 tablet 4   No current facility-administered medications for this visit.     OBJECTIVE:  Vitals:   04/27/17 1305  BP: 95/77  Pulse: 73  Resp: 18   Temp: 98.1 F (36.7 C)  SpO2: 100%     Body mass index is 26.42 kg/m.    ECOG FS:0 GENERAL: Patient is a well appearing female in no acute distress HEENT:  Sclerae anicteric.  Oropharynx clear and moist. No ulcerations or evidence of oropharyngeal candidiasis. Neck is supple.  NODES:  No cervical, supraclavicular, or axillary lymphadenopathy palpated.  BREAST EXAM:  Deferred. LUNGS:  Clear to auscultation bilaterally.  No wheezes or rhonchi. HEART:  Regular rate and rhythm. No murmur appreciated. ABDOMEN:  Soft, nontender.  Positive, normoactive bowel sounds. No organomegaly palpated. MSK:  No focal spinal tenderness to palpation. Full range of motion bilaterally in the upper extremities. EXTREMITIES:  Very mild swelling in right forearm, none noticed in right fingers, or upper arm.  Some puffiness noted underneath right axilla (no nodularity noted).  SKIN:  Clear with no obvious rashes or skin changes. No nail dyscrasia. NEURO:  Nonfocal. Well oriented.  Appropriate affect.     LAB RESULTS:  CMP     Component Value Date/Time   NA 142  02/15/2017 1054   K 4.1 02/15/2017 1054   CO2 25 02/15/2017 1054   GLUCOSE 71 02/15/2017 1054   BUN 12.3 02/15/2017 1054   CREATININE 0.9 02/15/2017 1054   CALCIUM 9.6 02/15/2017 1054   PROT 7.0 02/15/2017 1054   ALBUMIN 3.8 02/15/2017 1054   AST 25 02/15/2017 1054   ALT 15 02/15/2017 1054   ALKPHOS 62 02/15/2017 1054   BILITOT 0.60 02/15/2017 1054    INo results found for: SPEP, UPEP  Lab Results  Component Value Date   WBC 2.9 (L) 02/15/2017   NEUTROABS 1.5 02/15/2017   HGB 13.7 02/15/2017   HCT 39.7 02/15/2017   MCV 91.5 02/15/2017   PLT 209 02/15/2017      Chemistry      Component Value Date/Time   NA 142 02/15/2017 1054   K 4.1 02/15/2017 1054   CO2 25 02/15/2017 1054   BUN 12.3 02/15/2017 1054   CREATININE 0.9 02/15/2017 1054      Component Value Date/Time   CALCIUM 9.6 02/15/2017 1054   ALKPHOS 62 02/15/2017 1054    AST 25 02/15/2017 1054   ALT 15 02/15/2017 1054   BILITOT 0.60 02/15/2017 1054       No results found for: LABCA2  No components found for: LABCA125  No results for input(s): INR in the last 168 hours.  Urinalysis No results found for: COLORURINE, APPEARANCEUR, LABSPEC, PHURINE, GLUCOSEU, HGBUR, BILIRUBINUR, KETONESUR, PROTEINUR, UROBILINOGEN, NITRITE, LEUKOCYTESUR   STUDIES: Repeat mammography is due January 2019 ELIGIBLE FOR AVAILABLE RESEARCH PROTOCOL: no  ASSESSMENT: 42 y.o. Candice [name] woman currently residing in [place], status post right breast upper inner quadrant biopsy 04/21/2016 for a clinical T2 N0, stage IIa invasive ductal carcinoma, grade 2, estrogen and progesterone receptor positive, HER-2 nonamplified, with an MIB-1 of 20%  (a) biopsy of 2 additional suspicious areas in the right breast 05/12/2016 was benign  (1) started tamoxifen 04/27/2016, Discontinued at the start of chemotherapy  (2) Oncotype DX obtained from the original biopsy showed a score of 14, predicting a 10 year risk of recurrence outside the breast of 9% if the patient's only systemic therapy is tamoxifen for 5 years. It also predicts no benefit from chemotherapy.   (3) patient is not interested in fertility preservation  (4) genetics testing 04/27/2016 through the [facility] gene panel offered by [facility] found no deleterious mutations in APC, ATM, BARD1, BMPR1A, BRCA1, BRCA2, BRIP1, CHD1, CDK4, CDKN2A, CHEK2, EPCAM (large rearrangement only), MLH1, MSH2, MSH6, MUTYH, NBN, PALB2, PMS2, PTEN, RAD51C, RAD51D, SMAD4, STK11, and TP53. Sequencing was performed for select regions of POLE and POLD1, and large rearrangement analysis was performed for select regions of GREM1.   (5) status post right lumpectomy with sentinel lymph node sampling 06/07/2016 for a pT1c pN1, stage IB invasive ductal carcinoma, grade 2, with negative margins   (6) Mammaprint sent from the final  surgical sample was read as high risk, indicating a need for chemotherapy   (7) doxorubicin and cyclophosphamide in dose dense fashion 4 to started 07/12/2016, completed 08/23/2016 followed by paclitaxel weekly 12 started 09/06/2016 (received 2 cycles), changed to Abraxane on 09/27/2016 due to reaction to Paclitaxel: Abraxane discontinued after 1 cycle because of the same reaction.  (8) adjuvant radiation 11/01/16-12/19/16 Site/dose:   1. Right Breast: 50 Gy in 25 fractions                          2. Right Breast Boost: 10  Gy in 5 fractions                          3. Right Breast SCV: 50 Gy in 25 fractions    (9) resumed tamoxifen 12/26/2016, to be continued 5/10 years, Goserelin to start on 05/01/2017  (10) mammography with tomography January 2019--normal  (11) to follow up every 3 months for 2019    PLAN: Candice [name] is doing well today.  I will refer her to PT for her right arm concern for them to evaluate.  Since she started her menstrual cycle again, she met with Dr. Jana Hakim today to review Goserelin injections.  Her reviewed with her the benefit of surgical versus pharmacologic menopause.  She will proceed with the injections on 05/01/2017.  She has a f/u appt with me on 05/29/17 to review her Survivorship Care Plan.  I will also refer her to Dr. Denman George to discuss bilateral salpingo oophorectomies.    Candice [name] knows to call between now and her next appointment for any questions or concerns that may arise.    Wilber Bihari, NP  04/27/17 2:39 PM Medical Oncology and Hematology [facility] [address] [place], [place] [address] Tel. [phone]    Fax. [phone]   ADDENDUM: Candice [name]'s menses are resuming.  While that is adequate as far as tamoxifen is concerned, I think she will do better if we either remove her ovaries, which would be her preference, or go on go [place] and switch to anastrozole.  He is very adamant that she does not want children.  However the real  issue is how comfortable she will be with menopausal symptoms and how well she will tolerate anastrozole.  If we go on Zoladex for 6 months with anastrozole and she hates both of them then I think she would be very unhappy having had her ovaries.  Accordingly the plan will be to start with Zoladex.  She will have her first dose on 05/01/2017 and then every 28 days.  She understands she will have a period after the first dose but no more periods after the second dose.  She will not need to use contraceptives after the second dose.  She will see me sometime after the second or third dose to discuss her menopausal side effects and to start her on anastrozole.  She has a good understanding of this plan.  She is in agreement with it.  She will call with any other issues that may develop before the next visit.   I personally saw this patient and performed a substantive portion of this encounter with the listed APP documented above.   Bobetta Lime, MD Medical Oncology and Hematology [facility] [address] [place], [place] [address] Tel. [phone]    Fax. [phone]

## 2017-04-27 NOTE — Telephone Encounter (Signed)
Gave patient AVS and calendar of upcoming February through April appointments.  °

## 2017-05-01 ENCOUNTER — Inpatient Hospital Stay: Payer: Medicaid Other | Attending: Adult Health

## 2017-05-01 ENCOUNTER — Ambulatory Visit: Payer: Medicaid Other | Admitting: Oncology

## 2017-05-01 ENCOUNTER — Other Ambulatory Visit: Payer: Self-pay

## 2017-05-01 VITALS — BP 115/75 | HR 80 | Temp 98.0°F | Resp 18

## 2017-05-01 DIAGNOSIS — Z79810 Long term (current) use of selective estrogen receptor modulators (SERMs): Secondary | ICD-10-CM | POA: Diagnosis not present

## 2017-05-01 DIAGNOSIS — C50211 Malignant neoplasm of upper-inner quadrant of right female breast: Secondary | ICD-10-CM | POA: Diagnosis present

## 2017-05-01 DIAGNOSIS — Z170 Estrogen receptor positive status [ER+]: Secondary | ICD-10-CM

## 2017-05-01 DIAGNOSIS — Z9221 Personal history of antineoplastic chemotherapy: Secondary | ICD-10-CM | POA: Diagnosis not present

## 2017-05-01 DIAGNOSIS — Z923 Personal history of irradiation: Secondary | ICD-10-CM | POA: Insufficient documentation

## 2017-05-01 DIAGNOSIS — K589 Irritable bowel syndrome without diarrhea: Secondary | ICD-10-CM | POA: Diagnosis not present

## 2017-05-01 DIAGNOSIS — K5909 Other constipation: Secondary | ICD-10-CM | POA: Diagnosis not present

## 2017-05-01 DIAGNOSIS — Z95828 Presence of other vascular implants and grafts: Secondary | ICD-10-CM

## 2017-05-01 MED ORDER — LIDOCAINE-PRILOCAINE 2.5-2.5 % EX CREA: 1.0000 "application " | TOPICAL_CREAM | CUTANEOUS | 0 refills | Status: DC | PRN

## 2017-05-01 MED ORDER — GOSERELIN ACETATE 3.6 MG ~~LOC~~ IMPL
3.6000 mg | DRUG_IMPLANT | SUBCUTANEOUS | Status: DC
  Administered 2017-05-01: 3.6 mg via SUBCUTANEOUS

## 2017-05-03 ENCOUNTER — Ambulatory Visit: Payer: Medicaid Other | Attending: Adult Health | Admitting: Physical Therapy

## 2017-05-03 ENCOUNTER — Ambulatory Visit: Payer: Medicaid Other | Admitting: Oncology

## 2017-05-03 ENCOUNTER — Other Ambulatory Visit: Payer: Self-pay

## 2017-05-03 DIAGNOSIS — Z9189 Other specified personal risk factors, not elsewhere classified: Secondary | ICD-10-CM | POA: Diagnosis present

## 2017-05-03 DIAGNOSIS — Z483 Aftercare following surgery for neoplasm: Secondary | ICD-10-CM | POA: Diagnosis present

## 2017-05-03 NOTE — Therapy (Signed)
[place], [place], [address] Phone: [phone]   Fax:  [phone]  Physical Therapy Evaluation  Patient Details  Name: Candice [name] MRN: [id number] Date of Birth: 19-Jun-1975 Referring Provider: Thedore Mins, NP   Encounter Date: 05/03/2017  PT End of Session - 05/03/17 2027    Visit Number  1    Number of Visits  1    PT Start Time  1532    PT Stop Time  1612    PT Time Calculation (min)  40 min    Activity Tolerance  Patient tolerated treatment well    Behavior During Therapy  [facility] for tasks assessed/performed       Past Medical History:  Diagnosis Date  . Breast cancer ([facility]) 05/2016   right  . Chronic constipation   . Family history of breast cancer   . Family history of ovarian cancer   . Family history of pancreatic cancer   . Family history of prostate cancer   . IBS (irritable bowel syndrome)   . Personal history of chemotherapy   . Personal history of radiation therapy     Past Surgical History:  Procedure Laterality Date  . BARTHOLIN CYST MARSUPIALIZATION  09/04/2002  . BARTHOLIN GLAND CYST EXCISION Left 07/13/2005  . BREAST LUMPECTOMY Right   . BREAST LUMPECTOMY WITH RADIOACTIVE SEED AND SENTINEL LYMPH NODE BIOPSY Right 06/07/2016   Procedure: BREAST LUMPECTOMY WITH RADIOACTIVE SEED AND SENTINEL LYMPH NODE BIOPSY;  Surgeon: Alphonsa Overall, MD;  Location: [facility];  Service: General;  Laterality: Right;  . HYSTEROSCOPY  2016  . MYOMECTOMY  07/20/2004  . OVARIAN CYST REMOVAL Right 07/20/2004  . PORTACATH PLACEMENT N/A 07/05/2016   Procedure: INSERTION PORT-A-CATH WITH [place];  Surgeon: Alphonsa Overall, MD;  Location: [facility];  Service: General;  Laterality: N/A;    There were no vitals filed for this visit.   Subjective Assessment - 05/03/17 1535    Subjective  I'm feeling some swelling under my arm.  I don't know if it's natural when they take  out lymphnodes or if it's lymphedema.    Pertinent History  Diagnosed with right breast cancer 04/21/16.  Had lumpectomy with sentinel lymph node biopsy in March; nodes were positive.  Had adjuvant chemo May-July2018, then had radiation August-September 2018.  Is on tamoxifen and getting injections to stop her periods, once every six months. Noticed some swelling right after the surgery and recently has been feeling it; reports tenderness too, at right axilla and inferior to that. Otherwise healthy except for IBS x 2 years.    Limitations  Lifting    Patient Stated Goals  To find out if it's lymphedema.    Currently in Pain?  No/denies         [facility] PT Assessment - 05/03/17 0001      Assessment   Medical Diagnosis  right breast cancer s/p lumpectomy, sentinel node biospy, chemo and radiation    Referring Provider  Thedore Mins, NP    Hand Dominance  Right    Prior Therapy  none recently; has been seen here once, [facility] once      Precautions   Precautions  Other (comment)    Precaution Comments  cancer precautions      Restrictions   Weight Bearing Restrictions  No      Balance Screen   Has the patient fallen in the past 6 months  No    Has the  patient had a decrease in activity level because of a fear of falling?   No    Is the patient reluctant to leave their home because of a fear of falling?   No      Home Film/video editor residence    Living Arrangements  Parent    Type of [place]  Two level      Prior Function   Level of [place]  Part time employment    Vocation Requirements  professor in Estate agent    Leisure  5 days a week doing cardio in the gym      Cognition   Overall Cognitive Status  Within Functional Limits for tasks assessed      Observation/Other Assessments   Observations  She does have darkening of her skin at right upper back where radiation included that area, as well as  anterior thoracic areas    Quick DASH   7      ROM / Strength   AROM / PROM / Strength  AROM      AROM   Overall AROM Comments  both shoulders WFL, though right slightly less than left        LYMPHEDEMA/ONCOLOGY QUESTIONNAIRE - 05/03/17 1547      Lymphedema Assessments   Lymphedema Assessments  Upper extremities      Right Upper Extremity Lymphedema   10 cm Proximal to Olecranon Process  30.4 cm    Olecranon Process  23.7 cm    10 cm Proximal to Ulnar Styloid Process  20.7 cm    Just Proximal to Ulnar Styloid Process  14.9 cm    Across Hand at [employer]  18.6 cm    At [place] of 2nd Digit  6.1 cm    Other  Has gained 30 lbs. since on tamoxifen and medical menopause      Left Upper Extremity Lymphedema   10 cm Proximal to Olecranon Process  31.5 cm    Olecranon Process  23.5 cm    10 cm Proximal to Ulnar Styloid Process  20.9 cm    Just Proximal to Ulnar Styloid Process  15.1 cm    Across Hand at [employer]  17.9 cm    At [place] of 2nd Digit  6 cm        Quick Dash - 05/03/17 0001    Open a tight or new jar  No difficulty    Do heavy household chores (wash walls, wash floors)  No difficulty    Carry a shopping bag or briefcase  No difficulty    Wash your back  Mild difficulty    Use a knife to cut food  No difficulty    Recreational activities in which you take some force or impact through your arm, shoulder, or hand (golf, hammering, tennis)  No difficulty    During the past week, to what extent has your arm, shoulder or hand problem interfered with your normal social activities with family, friends, neighbors, or groups?  Not at all    During the past week, to what extent has your arm, shoulder or hand problem limited your work or other regular daily activities  Not at all    Arm, shoulder, or hand pain.  Mild    Tingling (pins and needles) in your arm, shoulder, or hand  Mild    Difficulty Sleeping  No  difficulty    DASH Score  6.82 %       Objective  measurements completed on examination: See above findings.                   PT Long Term Goals - 05/03/17 2038      PT LONG TERM GOAL #1   Title  Pt. will be comfortable with plans for self-care and exercise (related to lymphedema risk)    Status  Achieved             Plan - 05/03/17 2029    Clinical Impression Statement  This is a pleasant young woman s/p right breast cancer diagnosis and treated with chemotherapy, radiation, and tamoxifen. She has noticed a feeling of fullness or puffiness at right axilla/right upper flank and came in today wanting to know if it's lymphedema, which she learned about with education around her treatment.  Arm circumference measurements show reasonable differences between right and left UE, with right larger in places and left larger at other levels. She has gained 30 lbs. since diagnosis, and with that, her measurements are generally larger than they were at initial breast multi-disciplinary clinic measurements and when she was seen in this clinic in April 2018.  Any axilla or flank swelling is not perceived by this therapist with observation. Pt. was told that her measurements do not indicate arm lymphedema at this time and that the fullness she feels may still be a result of surgery and radiation to the area, that this culd wax and wane for some time.  No further treatment is planned for right now.  Pt. was encouraged to continue her exercise program and to consider wearing her compression sleeve for all of that; she was reassured that we are available to her in the future if she feels any need for reassessment.    History and Personal Factors relevant to plan of care:  right handed with right breast cancer treatment    Clinical Presentation  Evolving    Clinical Presentation due to:  recent perception of swelling at right flank/axilla    Clinical Decision Making  Moderate    Rehab Potential  Good    PT Frequency  One time visit    PT  Treatment/Interventions  Patient/family education    PT Next Visit Plan  No follow-up planned unless patient perceives changes.    Consulted and Agree with Plan of Care  Patient       Patient will benefit from skilled therapeutic intervention in order to improve the following deficits and impairments:  Decreased knowledge of precautions, Decreased knowledge of use of DME  Visit Diagnosis: Aftercare following surgery for neoplasm - Plan: PT plan of care cert/re-cert  At risk for lymphedema - Plan: PT plan of care cert/re-cert     Problem List Patient Active Problem List   Diagnosis Date Noted  . Port catheter in place 09/06/2016  . Genetic testing 05/09/2016  . Malignant neoplasm of upper-inner quadrant of right breast in female, estrogen receptor positive ([place]) 04/27/2016  . Family history of breast cancer   . Family history of ovarian cancer   . Family history of prostate cancer   . Family history of pancreatic cancer     Keneisha Heckart 05/03/2017, 8:41 PM  [place], [place], [address] Phone: [phone]   Fax:  [phone]  Name: Candice [name] MRN: [id number] Date of Birth: 11-15-1975  Serafina Royals, PT 05/03/17  8:41 PM

## 2017-05-09 ENCOUNTER — Ambulatory Visit: Payer: Medicaid Other | Admitting: Physical Therapy

## 2017-05-18 ENCOUNTER — Encounter: Payer: Medicaid Other | Admitting: Adult Health

## 2017-05-23 ENCOUNTER — Telehealth: Payer: Self-pay

## 2017-05-23 NOTE — Telephone Encounter (Signed)
LVM for pt reminding of [facility] appt on 05/29/17 at 2 pm with LC.  Left center number for call back if any questions or concerns.

## 2017-05-29 ENCOUNTER — Inpatient Hospital Stay: Payer: Medicaid Other

## 2017-05-29 ENCOUNTER — Encounter: Payer: Self-pay | Admitting: Adult Health

## 2017-05-29 ENCOUNTER — Inpatient Hospital Stay: Payer: Medicaid Other | Attending: Adult Health | Admitting: Adult Health

## 2017-05-29 VITALS — BP 105/75 | HR 72 | Temp 98.4°F | Resp 18 | Ht 64.0 in | Wt 156.2 lb

## 2017-05-29 DIAGNOSIS — Z79810 Long term (current) use of selective estrogen receptor modulators (SERMs): Secondary | ICD-10-CM | POA: Diagnosis not present

## 2017-05-29 DIAGNOSIS — C50211 Malignant neoplasm of upper-inner quadrant of right female breast: Secondary | ICD-10-CM | POA: Insufficient documentation

## 2017-05-29 DIAGNOSIS — Z170 Estrogen receptor positive status [ER+]: Secondary | ICD-10-CM

## 2017-05-29 DIAGNOSIS — E2839 Other primary ovarian failure: Secondary | ICD-10-CM

## 2017-05-29 DIAGNOSIS — Z923 Personal history of irradiation: Secondary | ICD-10-CM

## 2017-05-29 DIAGNOSIS — Z79818 Long term (current) use of other agents affecting estrogen receptors and estrogen levels: Secondary | ICD-10-CM | POA: Diagnosis not present

## 2017-05-29 DIAGNOSIS — Z9221 Personal history of antineoplastic chemotherapy: Secondary | ICD-10-CM | POA: Diagnosis not present

## 2017-05-29 DIAGNOSIS — Z95828 Presence of other vascular implants and grafts: Secondary | ICD-10-CM

## 2017-05-29 MED ORDER — GOSERELIN ACETATE 3.6 MG ~~LOC~~ IMPL
DRUG_IMPLANT | SUBCUTANEOUS | Status: AC
Start: 2017-05-29 — End: 2017-05-29
  Filled 2017-05-29: qty 3.6

## 2017-05-29 MED ORDER — GOSERELIN ACETATE 3.6 MG ~~LOC~~ IMPL
3.6000 mg | DRUG_IMPLANT | SUBCUTANEOUS | Status: DC
  Administered 2017-05-29: 3.6 mg via SUBCUTANEOUS

## 2017-05-29 MED ORDER — ANASTROZOLE 1 MG PO TABS: 1.0000 mg | ORAL_TABLET | Freq: Every day | ORAL | 2 refills | Status: DC

## 2017-05-29 NOTE — Progress Notes (Signed)
CLINIC:  Survivorship   REASON FOR VISIT:  Routine follow-up post-treatment for a recent history of breast cancer.  BRIEF ONCOLOGIC HISTORY:    Malignant neoplasm of upper-inner quadrant of right breast in female, estrogen receptor positive ([place])   04/21/2016 Initial Biopsy    Right breast upper inner quadrant biopsy: IDC, grade 2, ER/PR positive, Her-2 negative, Ki-67 20%.      04/27/2016 Initial Diagnosis    Malignant neoplasm of upper-inner quadrant of right breast in female, estrogen receptor positive ([place])      04/27/2016 -  Anti-estrogen oral therapy    Tamoxifen started, then stopped at start of chemotherapy and resumed in 12/26/2016, Goserelin started on 05/01/2017, to change to Anastrozole after 2-3 doses of Goserelin      05/09/2016 Genetic Testing    Patient has genetic testing done for personal and family history of breast cancer. Negative genetic testing on the [facility] panel.  The [facility] gene panel offered by [employer] includes sequencing and deletion/duplication testing of the following 28 genes: APC, ATM, BARD1, BMPR1A, BRCA1, BRCA2, BRIP1, CHD1, CDK4, CDKN2A, CHEK2, EPCAM (large rearrangement only), MLH1, MSH2, MSH6, MUTYH, NBN, PALB2, PMS2, PTEN, RAD51C, RAD51D, SMAD4, STK11, and TP53. Sequencing was performed for select regions of POLE and POLD1, and large rearrangement analysis was performed for select regions of GREM1.       06/07/2016 Surgery    Right lumpectomy and SLNB: IDC, grade 2, 1.8 cm, margins neg, 3 SLN biopsied, 1 with metastatic disease, 1 with micrometastatic disease      06/07/2016 Miscellaneous    Mammaprint high risk, indicated need for chemo      07/12/2016 - 09/27/2016 Adjuvant Chemotherapy    Doxorubicin and cyclophosphamide x 4 followed by 2 of weekly Paclitaxel stopped due to reaction, followed by 1 of Abraxane stopped for the same reason.      11/01/2016 - 12/19/2016 Radiation Therapy     1. Right Breast, axilla, SCV: 50  Gy in 25 fractions                          2. Right Breast Boost: 10 Gy in 5 fractions          INTERVAL HISTORY:  Candice [name] presents to the [facility] today for our initial meeting to review her survivorship care plan detailing her treatment course for breast cancer, as well as monitoring long-term side effects of that treatment, education regarding health maintenance, screening, and overall wellness and health promotion.     Overall, Candice [name] reports feeling quite well.  She denies any issues today.  She started Goserelin injections last month.  She had one heavy period.  She has continued on Tamoxifen with good tolerance.  She was evaluated with physical therapy for possible lymphedema.  They noted there was a small difference, but negligible.     REVIEW OF SYSTEMS:  Review of Systems  Constitutional: Negative for appetite change, chills, fatigue, fever and unexpected weight change.  HENT:   Negative for hearing loss, lump/mass and trouble swallowing.   Eyes: Negative for eye problems and icterus.  Respiratory: Negative for chest tightness, cough and shortness of breath.   Cardiovascular: Negative for chest pain, leg swelling and palpitations.  Gastrointestinal: Negative for abdominal distention, abdominal pain, constipation, diarrhea, nausea and vomiting.  Endocrine: Negative for hot flashes.  Musculoskeletal: Negative for arthralgias.  Skin: Negative for itching and rash.  Neurological: Negative for dizziness, extremity weakness, headaches  and numbness.  Hematological: Negative for adenopathy. Does not bruise/bleed easily.  Psychiatric/Behavioral: Negative for depression. The patient is not nervous/anxious.   Breast: Denies any new nodularity, masses, tenderness, nipple changes, or nipple discharge.      ONCOLOGY TREATMENT TEAM:  1. Surgeon:  Dr. Lucia Gaskins at [facility] 2. Medical Oncologist: Dr. Jana Hakim  3. Radiation Oncologist: Dr. Isidore Moos     PAST MEDICAL/SURGICAL HISTORY:  Past Medical History:  Diagnosis Date  . Breast cancer ([place]) 05/2016   right  . Chronic constipation   . Family history of breast cancer   . Family history of ovarian cancer   . Family history of pancreatic cancer   . Family history of prostate cancer   . IBS (irritable bowel syndrome)   . Personal history of chemotherapy   . Personal history of radiation therapy    Past Surgical History:  Procedure Laterality Date  . BARTHOLIN CYST MARSUPIALIZATION  09/04/2002  . BARTHOLIN GLAND CYST EXCISION Left 07/13/2005  . BREAST LUMPECTOMY Right   . BREAST LUMPECTOMY WITH RADIOACTIVE SEED AND SENTINEL LYMPH NODE BIOPSY Right 06/07/2016   Procedure: BREAST LUMPECTOMY WITH RADIOACTIVE SEED AND SENTINEL LYMPH NODE BIOPSY;  Surgeon: Alphonsa Overall, MD;  Location: [place];  Service: General;  Laterality: Right;  . HYSTEROSCOPY  2016  . MYOMECTOMY  07/20/2004  . OVARIAN CYST REMOVAL Right 07/20/2004  . PORTACATH PLACEMENT N/A 07/05/2016   Procedure: INSERTION PORT-A-CATH WITH [place];  Surgeon: Alphonsa Overall, MD;  Location: [place];  Service: General;  Laterality: N/A;     ALLERGIES:  Allergies  Allergen Reactions  . Paclitaxel Itching  . Amoxicillin Rash     CURRENT MEDICATIONS:  Outpatient Encounter Medications as of 05/29/2017  Medication Sig  . lidocaine-prilocaine (EMLA) cream Apply 1 application topically as needed.  . tamoxifen (NOLVADEX) 20 MG tablet Take 1 tablet (20 mg total) by mouth daily.   No facility-administered encounter medications on file as of 05/29/2017.      ONCOLOGIC FAMILY HISTORY:  Family History  Problem Relation Age of Onset  . Breast cancer Mother 50  . Pancreatic cancer Paternal Grandmother   . Prostate cancer Paternal Grandfather   . Ovarian cancer Other        MGMs sister  . Breast cancer Other        mother's maternal first cousin     GENETIC COUNSELING/TESTING: Negative  SOCIAL  HISTORY:  Candice [name] is single and lives with her parents in [place], [place].   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  She denies any current or history of tobacco, alcohol, or illicit drug use.     PHYSICAL EXAMINATION:  Vital Signs:   Vitals:   05/29/17 1417  BP: 105/75  Pulse: 72  Resp: 18  Temp: 98.4 F (36.9 C)  SpO2: 100%   Filed Weights   05/29/17 1417  Weight: 156 lb 3.2 oz (70.9 kg)   General: Well-nourished, well-appearing female in no acute distress.  She is accompanied in clinic by her sister today.   HEENT: Head is normocephalic.  Pupils equal and reactive to light. Conjunctivae clear without exudate.  Sclerae anicteric. Oral mucosa is pink, moist.  Oropharynx is pink without lesions or erythema.  Lymph: No cervical, supraclavicular, or infraclavicular lymphadenopathy noted on palpation.  Cardiovascular: Regular rate and rhythm.Marland Kitchen Respiratory: Clear to auscultation bilaterally. Chest expansion symmetric; breathing non-labored.  GI: Abdomen soft and round;  non-tender, non-distended. Bowel sounds normoactive.  GU: Deferred.  Neuro: No focal deficits. Steady gait.  Psych: Mood and affect normal and appropriate for situation.  Extremities: No edema. MSK: No focal spinal tenderness to palpation.  Full range of motion in bilateral upper extremities Skin: Warm and dry.  LABORATORY DATA:  None for this visit.  DIAGNOSTIC IMAGING:       ASSESSMENT AND PLAN:  Ms.. [name] is a pleasant 42 y.o. female with Stage IA right breast invasive ductal carcinoma, ER+/PR+/HER2-, diagnosed in 03/2016, treated with lumpectomy, adjuvant chemotherapy, adjuvant radiation therapy, and anti-estrogen therapy with Tamoxifen transitioning to Goserelin and Anastrozole.  She presents to the Survivorship Clinic for our initial meeting and routine follow-up post-completion of treatment for breast cancer.    1. Stage  IA right breast cancer:  Candice [name] is continuing to recover from definitive treatment for breast cancer. She will follow-up with her medical oncologist, Dr. Jana Hakim in one month with history and physical exam per surveillance protocol.  She started on Goserelin last month and tolerated it well.  Per Dr. Jana Hakim, she will stop Tamoxifen after today, take a one month break and then start Anastrozole 16m daily.  This was reviewed with her in detail. Today, a comprehensive survivorship care plan and treatment summary was reviewed with the patient today detailing her breast cancer diagnosis, treatment course, potential late/long-term effects of treatment, appropriate follow-up care with recommendations for the future, and patient education resources.  A copy of this summary, along with a letter will be sent to the patient's primary care provider via mail/fax/In Basket message after today's visit.    2. Bone health:  Given Candice [name]'s age/history of breast cancer and her upcoming treatment regimen including anti-estrogen therapy with Anastrozole, she is at risk for bone demineralization. She has not yet had a bone density test, and I ordered for this to be done at the breast center.  In the meantime, she was encouraged to increase her consumption of foods rich in calcium, as well as increase her weight-bearing activities.  She was given education on specific activities to promote bone health.  3. Cancer screening:  Due to Candice [name]'s history and her age, she should receive screening for skin cancers, colon cancer, and gynecologic cancers.  The information and recommendations are listed on the patient's comprehensive care plan/treatment summary and were reviewed in detail with the patient.    4. Health maintenance and wellness promotion: Candice [name] encouraged to consume 5-7 servings of fruits and vegetables per day. We reviewed the "Nutrition Rainbow" handout, as well as the handout "Take Control of  Your Health and Reduce Your Cancer Risk" from the [employer]  She was also encouraged to engage in moderate to vigorous exercise for 30 minutes per day most days of the week. We discussed the [employer] fitness program, which is designed for cancer survivors to help them become more physically fit after cancer treatments.  She was instructed to limit her alcohol consumption and continue to abstain from tobacco use.     5. Support services/counseling: It is not uncommon for this period of the patient's cancer care trajectory to be one of many emotions and stressors.  We discussed an opportunity for her to participate in the next session of [facility]Finding Your New Normal") support group series designed for patients after they have completed treatment.   Ms. CBenedictwas encouraged to take advantage of our many other support services programs, support groups, and/or counseling in  coping with her new life as a cancer survivor after completing anti-cancer treatment.  She was offered support today through active listening and expressive supportive counseling.  She was given information regarding our available services and encouraged to contact me with any questions or for help enrolling in any of our support group/programs.    Dispo:   -Return to cancer center in one month for f/u with Dr. Jana Hakim  -Mammogram due in 03/2018 -She is welcome to return back to the Survivorship Clinic at any time; no additional follow-up needed at this time.  -Consider referral back to survivorship as a long-term survivor for continued surveillance  A total of (30) minutes of face-to-face time was spent with this patient with greater than 50% of that time in counseling and care-coordination.   Candice Phlegm, NP [facility] [place] [phone]   Note: PRIMARY CARE PROVIDER Jonathon Jordan, [place] [phone]

## 2017-05-29 NOTE — Patient Instructions (Signed)
Bone Health Bones protect organs, store calcium, and anchor muscles. Good health habits, such as eating nutritious foods and exercising regularly, are important for maintaining healthy bones. They can also help to prevent a condition that causes bones to lose density and become weak and brittle (osteoporosis). Why is bone mass important? Bone mass refers to the amount of bone tissue that you have. The higher your bone mass, the stronger your bones. An important step toward having healthy bones throughout life is to have strong and dense bones during childhood. A young adult who has a high bone mass is more likely to have a high bone mass later in life. Bone mass at its greatest it is called peak bone mass. A large decline in bone mass occurs in older adults. In women, it occurs about the time of menopause. During this time, it is important to practice good health habits, because if more bone is lost than what is replaced, the bones will become less healthy and more likely to break (fracture). If you find that you have a low bone mass, you may be able to prevent osteoporosis or further bone loss by changing your diet and lifestyle. How can I find out if my bone mass is low? Bone mass can be measured with an X-ray test that is called a bone mineral density (BMD) test. This test is recommended for all women who are age 65 or older. It may also be recommended for men who are age 70 or older, or for people who are more likely to develop osteoporosis due to:  Having bones that break easily.  Having a long-term disease that weakens bones, such as kidney disease or rheumatoid arthritis.  Having menopause earlier than normal.  Taking medicine that weakens bones, such as steroids, thyroid hormones, or hormone treatment for breast cancer or prostate cancer.  Smoking.  Drinking three or more alcoholic drinks each day.  What are the nutritional recommendations for healthy bones? To have healthy bones, you  need to get enough of the right minerals and vitamins. Most nutrition experts recommend getting these nutrients from the foods that you eat. Nutritional recommendations vary from person to person. Ask your health care provider what is healthy for you. Here are some general guidelines. Calcium Recommendations Calcium is the most important (essential) mineral for bone health. Most people can get enough calcium from their diet, but supplements may be recommended for people who are at risk for osteoporosis. Good sources of calcium include:  Dairy products, such as low-fat or nonfat milk, cheese, and yogurt.  Dark green leafy vegetables, such as bok choy and broccoli.  Calcium-fortified foods, such as orange juice, cereal, bread, soy beverages, and tofu products.  Nuts, such as almonds.  Follow these recommended amounts for daily calcium intake:  Children, age 1?3: 700 mg.  Children, age 4?8: 1,000 mg.  Children, age 9?13: 1,300 mg.  Teens, age 14?18: 1,300 mg.  Adults, age 19?50: 1,000 mg.  Adults, age 51?70: ? Men: 1,000 mg. ? Women: 1,200 mg.  Adults, age 71 or older: 1,200 mg.  Pregnant and breastfeeding females: ? Teens: 1,300 mg. ? Adults: 1,000 mg.  Vitamin D Recommendations Vitamin D is the most essential vitamin for bone health. It helps the body to absorb calcium. Sunlight stimulates the skin to make vitamin D, so be sure to get enough sunlight. If you live in a cold climate or you do not get outside often, your health care provider may recommend that you take vitamin   D supplements. Good sources of vitamin D in your diet include:  Egg yolks.  Saltwater fish.  Milk and cereal fortified with vitamin D.  Follow these recommended amounts for daily vitamin D intake:  Children and teens, age 1?18: 600 international units.  Adults, age 50 or younger: 400-800 international units.  Adults, age 51 or older: 800-1,000 international units.  Other Nutrients Other nutrients  for bone health include:  Phosphorus. This mineral is found in meat, poultry, dairy foods, nuts, and legumes. The recommended daily intake for adult men and adult women is 700 mg.  Magnesium. This mineral is found in seeds, nuts, dark green vegetables, and legumes. The recommended daily intake for adult men is 400?420 mg. For adult women, it is 310?320 mg.  Vitamin K. This vitamin is found in green leafy vegetables. The recommended daily intake is 120 mg for adult men and 90 mg for adult women.  What type of physical activity is best for building and maintaining healthy bones? Weight-bearing and strength-building activities are important for building and maintaining peak bone mass. Weight-bearing activities cause muscles and bones to work against gravity. Strength-building activities increases muscle strength that supports bones. Weight-bearing and muscle-building activities include:  Walking and hiking.  Jogging and running.  Dancing.  Gym exercises.  Lifting weights.  Tennis and racquetball.  Climbing stairs.  Aerobics.  Adults should get at least 30 minutes of moderate physical activity on most days. Children should get at least 60 minutes of moderate physical activity on most days. Ask your health care provide what type of exercise is best for you. Where can I find more information? For more information, check out the following websites:  National Osteoporosis Foundation: http://nof.org/learn/basics  National Institutes of Health: http://www.niams.nih.gov/Health_Info/Bone/Bone_Health/bone_health_for_life.asp  This information is not intended to replace advice given to you by your health care provider. Make sure you discuss any questions you have with your health care provider. Document Released: 06/04/2003 Document Revised: 10/02/2015 Document Reviewed: 03/19/2014 Elsevier Interactive Patient Education  2018 Elsevier Inc.  

## 2017-05-29 NOTE — Patient Instructions (Signed)
Goserelin injection What is this medicine? GOSERELIN (GOE se rel in) is similar to a hormone found in the body. It lowers the amount of sex hormones that the body makes. Men will have lower testosterone levels and women will have lower estrogen levels while taking this medicine. In men, this medicine is used to treat prostate cancer; the injection is either given once per month or once every 12 weeks. A once per month injection (only) is used to treat women with endometriosis, dysfunctional uterine bleeding, or advanced breast cancer. This medicine may be used for other purposes; ask your health care provider or pharmacist if you have questions. COMMON BRAND NAME(S): Zoladex What should I tell my health care provider before I take this medicine? They need to know if you have any of these conditions (some only apply to women): -diabetes -heart disease or previous heart attack -high blood pressure -high cholesterol -kidney disease -osteoporosis or low bone density -problems passing urine -spinal cord injury -stroke -tobacco smoker -an unusual or allergic reaction to goserelin, hormone therapy, other medicines, foods, dyes, or preservatives -pregnant or trying to get pregnant -breast-feeding How should I use this medicine? This medicine is for injection under the skin. It is given by a health care professional in a hospital or clinic setting. Men receive this injection once every 4 weeks or once every 12 weeks. Women will only receive the once every 4 weeks injection. Talk to your pediatrician regarding the use of this medicine in children. Special care may be needed. Overdosage: If you think you have taken too much of this medicine contact a poison control center or emergency room at once. NOTE: This medicine is only for you. Do not share this medicine with others. What if I miss a dose? It is important not to miss your dose. Call your doctor or health care professional if you are unable to  keep an appointment. What may interact with this medicine? -female hormones like estrogen -herbal or dietary supplements like black cohosh, chasteberry, or DHEA -female hormones like testosterone -prasterone This list may not describe all possible interactions. Give your health care provider a list of all the medicines, herbs, non-prescription drugs, or dietary supplements you use. Also tell them if you smoke, drink alcohol, or use illegal drugs. Some items may interact with your medicine. What should I watch for while using this medicine? Visit your doctor or health care professional for regular checks on your progress. Your symptoms may appear to get worse during the first weeks of this therapy. Tell your doctor or healthcare professional if your symptoms do not start to get better or if they get worse after this time. Your bones may get weaker if you take this medicine for a long time. If you smoke or frequently drink alcohol you may increase your risk of bone loss. A family history of osteoporosis, chronic use of drugs for seizures (convulsions), or corticosteroids can also increase your risk of bone loss. Talk to your doctor about how to keep your bones strong. This medicine should stop regular monthly menstration in women. Tell your doctor if you continue to menstrate. Women should not become pregnant while taking this medicine or for 12 weeks after stopping this medicine. Women should inform their doctor if they wish to become pregnant or think they might be pregnant. There is a potential for serious side effects to an unborn child. Talk to your health care professional or pharmacist for more information. Do not breast-feed an infant while taking   this medicine. Men should inform their doctors if they wish to father a child. This medicine may lower sperm counts. Talk to your health care professional or pharmacist for more information. What side effects may I notice from receiving this  medicine? Side effects that you should report to your doctor or health care professional as soon as possible: -allergic reactions like skin rash, itching or hives, swelling of the face, lips, or tongue -bone pain -breathing problems -changes in vision -chest pain -feeling faint or lightheaded, falls -fever, chills -pain, swelling, warmth in the leg -pain, tingling, numbness in the hands or feet -signs and symptoms of low blood pressure like dizziness; feeling faint or lightheaded, falls; unusually weak or tired -stomach pain -swelling of the ankles, feet, hands -trouble passing urine or change in the amount of urine -unusually high or low blood pressure -unusually weak or tired Side effects that usually do not require medical attention (report to your doctor or health care professional if they continue or are bothersome): -change in sex drive or performance -changes in breast size in both males and females -changes in emotions or moods -headache -hot flashes -irritation at site where injected -loss of appetite -skin problems like acne, dry skin -vaginal dryness This list may not describe all possible side effects. Call your doctor for medical advice about side effects. You may report side effects to FDA at 1-800-FDA-1088. Where should I keep my medicine? This drug is given in a hospital or clinic and will not be stored at home. NOTE: This sheet is a summary. It may not cover all possible information. If you have questions about this medicine, talk to your doctor, pharmacist, or health care provider.  2018 Elsevier/Gold Standard (2013-05-21 11:10:35)  

## 2017-05-30 ENCOUNTER — Telehealth: Payer: Self-pay | Admitting: Adult Health

## 2017-05-30 NOTE — Telephone Encounter (Signed)
Per 3/4 no los °

## 2017-06-17 NOTE — Progress Notes (Signed)
[facility]  Telephone:([phone]) [phone] Fax:(336) [phone]     ID: Candice [name] DOB: July 08, 1975  MR#: [id number]  UUV#:[id number]  Patient Care Team: Jonathon Jordan, MD as PCP - General (Family Medicine) Alphonsa Overall, MD as Consulting Physician (General Surgery) Magrinat, Virgie Dad, MD as Consulting Physician (Oncology) Eppie Gibson, MD as Attending Physician (Radiation Oncology) Osborne Oman, MD as Attending Physician (Obstetrics and Gynecology) Cheryll Cockayne, MD as Referring Physician (Surgical Oncology) Delice Bison, Charlestine Massed, NP as Nurse Practitioner (Hematology and Oncology) OTHER MD:  CHIEF COMPLAINT: estrogen receptor positive breast cancer  CURRENT TREATMENT: goserelin, and anastrozole   BREAST CANCER HISTORY: From the original intake note:  Candice [name] [facility] [place] in December, at which time a mass in the right breast was noted. She was referred for [facility] and screening mammography was obtained, showing a possible mass in the right breast.on 04/21/2016 she underwent right diagnostic mammography with tomography and ultrasonography at the breast Center, and this confirmedan irregular spiculat in the right breast measuring 1.8 cm, which was not palpable to the mammographer. Ultrasonography confirmed an irregular hypoechoic right breast mass at the 12:30 o'clock position 4 cm from the nipple measuring 2.2 cm. The right axilla was sonographically benign.  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  Her subsequent history is detailed below  INTERVAL HISTORY: Candice [name] returns today for follow-up and treatment of her estrogen receptor positive breast cancer.  She is accompanied by her sister. She was taken off tamoxifen, on her last visit and reports that she is having a lot of hot flashes following this. She has occasional hot flashes up to two times a day and they last minutes at a time. She notes that 1-2 times, she did sweat. She has hot flashes and night sweats and she sleeps with the fan on and the window open.  She also receives goserelin monthly, first dose 05/01/2017. She has had good tolerance with this medication. She reported that her menstrual cycle was extremely heavy after the first dose and she had to wear a tampon and pad.  She has had no further menstrual periods.  Since her last visit to the office, she underwent a diagnostic mammogram on 03/29/2017 at [facility] with results showing: Breast density category C. No mammographic evidence for malignancy.   REVIEW OF SYSTEMS: Candice [name] reports that for exercise, that she is going to the gym and she has been weight lifting. She recently obtained a sleeve for the right arm. She reports recent weight gain and notes that she is eating veggies, fruits, and yogurt due to IBS. 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 She denies issues with sleep. She denies unusual headaches, visual changes, nausea, vomiting, or dizziness. There has been no unusual cough, phlegm production, or pleurisy. This been no change in bowel or bladder habits. She denies unexplained fatigue or unexplained weight loss, bleeding, rash, or fever. A detailed review of systems was otherwise stable.    PAST MEDICAL HISTORY: Past Medical History:  Diagnosis Date  . Breast cancer ([place]) 05/2016   right  . Chronic constipation   . Family history of  breast cancer   . Family history of ovarian cancer   . Family history of pancreatic cancer   . Family history of prostate cancer   . IBS (irritable bowel syndrome)   . Personal history of  chemotherapy   . Personal history of radiation therapy     PAST SURGICAL HISTORY: Past Surgical History:  Procedure Laterality Date  . BARTHOLIN CYST MARSUPIALIZATION  09/04/2002  . BARTHOLIN GLAND CYST EXCISION Left 07/13/2005  . BREAST LUMPECTOMY Right   . BREAST LUMPECTOMY WITH RADIOACTIVE SEED AND SENTINEL LYMPH NODE BIOPSY Right 06/07/2016   Procedure: BREAST LUMPECTOMY WITH RADIOACTIVE SEED AND SENTINEL LYMPH NODE BIOPSY;  Surgeon: Alphonsa Overall, MD;  Location: [facility];  Service: General;  Laterality: Right;  . HYSTEROSCOPY  2016  . MYOMECTOMY  07/20/2004  . OVARIAN CYST REMOVAL Right 07/20/2004  . PORTACATH PLACEMENT N/A 07/05/2016   Procedure: INSERTION PORT-A-CATH WITH [place];  Surgeon: Alphonsa Overall, MD;  Location: [facility];  Service: General;  Laterality: N/A;    FAMILY HISTORY Family History  Problem Relation Age of Onset  . Breast cancer Mother 78  . Pancreatic cancer Paternal Grandmother   . Prostate cancer Paternal Grandfather   . Ovarian cancer Other        MGMs sister  . Breast cancer Other        mother's maternal first cousin  The patient's father, [name], lives in [place] and works as a Geneticist, molecular. The patient's mother is a Quarry manager. The patient has no brother, one sister, who works in [place] as a Network engineer. The patient's mother had breast cancer, stage 0, diagnosed at age 63. There are 2 maternal relatives (mother's sister and mother's niece) with breast cancer, both postmenopausal.  GYNECOLOGIC HISTORY:  Menarche age 63, the patient is GX P0. She has regular periods, most recently mid-January. She is on oral contraceptives at present. No LMP recorded. (Menstrual status: Chemotherapy).   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 She generally  lives in [place] by herself although she is currently staying at her parents.    ADVANCED DIRECTIVES: Not in place. The patient tells me she intends to name her sister as her healthcare power of attorney.   HEALTH MAINTENANCE: Social History   Tobacco Use  . Smoking status: Never Smoker  . Smokeless tobacco: Never Used  Substance Use Topics  . Alcohol use: Yes    Alcohol/week: 0.6 oz    Types: 1 Glasses of wine per week    Comment: socially  . Drug use: No     Colonoscopy: July 2016, in [place]  PAP:  Bone density: Never   Allergies  Allergen Reactions  . Paclitaxel Itching  . Amoxicillin Rash    Current Outpatient Medications  Medication Sig Dispense Refill  . anastrozole (ARIMIDEX) 1 MG tablet Take 1 tablet (1 mg total) by mouth daily. 30 tablet 2  . lidocaine-prilocaine (EMLA) cream Apply 1 application topically as needed. 30 g 0   No current facility-administered medications for this visit.     OBJECTIVE: A young African-American woman who appears well  Vitals:   06/19/17 1518  BP: 120/77  Pulse: 78  Resp: 18  Temp: 98.7 F (37.1 C)  SpO2: 100%     Body mass index is 26.78 kg/m.  ECOG FS:0  Sclerae unicteric, EOMs intact Oropharynx clear and moist No cervical or supraclavicular adenopathy Lungs no rales or rhonchi Heart regular rate and rhythm Abd soft, nontender, positive bowel sounds MSK no focal spinal tenderness, no upper extremity lymphedema Neuro: nonfocal, well oriented, appropriate affect Breasts: On the right the breast is status post lumpectomy and radiation.  There is still minimal hyperpigmentation.  Otherwise the cosmetic result is good and there is no evidence of local recurrence.  The left breast is benign.  LAB RESULTS:  CMP     Component Value Date/Time   NA 142 02/15/2017 1054   K 4.1 02/15/2017 1054   CO2 25 02/15/2017 1054   GLUCOSE 71 02/15/2017 1054   BUN 12.3 02/15/2017 1054   CREATININE 0.9 02/15/2017 1054   CALCIUM  9.6 02/15/2017 1054   PROT 7.0 02/15/2017 1054   ALBUMIN 3.8 02/15/2017 1054   AST 25 02/15/2017 1054   ALT 15 02/15/2017 1054   ALKPHOS 62 02/15/2017 1054   BILITOT 0.60 02/15/2017 1054    INo results found for: SPEP, UPEP  Lab Results  Component Value Date   WBC 4.0 06/19/2017   NEUTROABS 1.8 06/19/2017   HGB 13.8 06/19/2017   HCT 41.2 06/19/2017   MCV 94.2 06/19/2017   PLT 206 06/19/2017      Chemistry      Component Value Date/Time   NA 142 02/15/2017 1054   K 4.1 02/15/2017 1054   CO2 25 02/15/2017 1054   BUN 12.3 02/15/2017 1054   CREATININE 0.9 02/15/2017 1054      Component Value Date/Time   CALCIUM 9.6 02/15/2017 1054   ALKPHOS 62 02/15/2017 1054   AST 25 02/15/2017 1054   ALT 15 02/15/2017 1054   BILITOT 0.60 02/15/2017 1054       No results found for: LABCA2  No components found for: LABCA125  No results for input(s): INR in the last 168 hours.  Urinalysis No results found for: COLORURINE, APPEARANCEUR, LABSPEC, PHURINE, GLUCOSEU, HGBUR, BILIRUBINUR, KETONESUR, PROTEINUR, UROBILINOGEN, NITRITE, LEUKOCYTESUR   STUDIES: Since her last visit to the office, she underwent a diagnostic mammogram on 03/29/2017 at [facility] with results showing: Breast density category C. No mammographic evidence for malignancy.  ELIGIBLE FOR AVAILABLE RESEARCH PROTOCOL: no  ASSESSMENT: 42 y.o. Candice [name] woman currently residing in [place], status post right breast upper inner quadrant biopsy 04/21/2016 for a clinical T2 N0, stage IIa invasive ductal carcinoma, grade 2, estrogen and progesterone receptor positive, HER-2 nonamplified, with an MIB-1 of 20%  (a) biopsy of 2 additional suspicious areas in the right breast 05/12/2016 was benign  (1) started tamoxifen 04/27/2016, Discontinued at the start of chemotherapy  (2) Oncotype DX obtained from the original biopsy showed a score of 14, predicting a 10 year risk of recurrence outside the breast  of 9% if the patient's only systemic therapy is tamoxifen for 5 years. It also predicts no benefit from chemotherapy.   (3) patient is not interested in fertility preservation  (4) genetics testing 04/27/2016 through the [facility] gene panel offered by [facility] found no deleterious mutations in APC, ATM, BARD1, BMPR1A, BRCA1, BRCA2, BRIP1, CHD1, CDK4, CDKN2A, CHEK2, EPCAM (large rearrangement only), MLH1, MSH2, MSH6, MUTYH, NBN, PALB2, PMS2, PTEN, RAD51C, RAD51D, SMAD4, STK11, and TP53. Sequencing was performed for select regions of POLE and POLD1, and large rearrangement analysis was performed for select regions of GREM1.   (5) status post right lumpectomy with sentinel lymph node sampling 06/07/2016  for a pT1c pN1, stage IB invasive ductal carcinoma, grade 2, with negative margins   (6) Mammaprint sent from the final surgical sample was read as high risk, indicating a need for chemotherapy   (7) doxorubicin and cyclophosphamide in dose dense fashion 4 to started 07/12/2016, completed 08/23/2016 followed by paclitaxel weekly 12 started 09/06/2016 (received 2 cycles), changed to Abraxane on 09/27/2016 due to reaction to Paclitaxel: Abraxane discontinued after 1 cycle because of the same reaction.  (8) adjuvant radiation 11/01/16-12/19/16 Site/dose:   1. Right Breast: 50 Gy in 25 fractions                          2. Right Breast Boost: 10 Gy in 5 fractions                          3. Right Breast SCV: 50 Gy in 25 fractions    (9) resumed tamoxifen 12/26/2016, discontinued January 2019 in preparation for anastrozole  (10) goserelin started 05/01/2017  This is 11) anastrozole started 06/19/2017  PLAN: Dvora is now a year out from definitive surgery with no evidence of disease recurrence.  This is very favorable.  She is tolerating the goserelin well.  She is now menopausal and as a result has gained weight and is having more hot flashes.  Vaginal dryness does not yet seem  to be a major issue.  She is sleeping moderately well except for night sweats.  She is now ready to start anastrozole.  The reason to switch of course is the improved results with this combination as opposed to tamoxifen alone.  She understands that weight gain is part of menopause and if she really wishes to lose 15 pounds she will have to cut the calories and also increase her exercise.  Otherwise the plan is to continue both goserelin and anastrozole for a total of 4 more years after which she can go back to tamoxifen if she wishes and continue that for 5 additional years  I suggest that she try gabapentin at bedtime for the nighttime hot flashes.  I mentioned  venlafaxine for daytime hot flashes and if she would like to try that during the day she will give me a call  She will see me again in June.  She knows to call for any other issues that may develop before then.  Magrinat, Virgie Dad, MD  06/19/17 3:36 PM Medical Oncology and Hematology [facility] [address] [place], [place] [address] Tel. [phone]    Fax. [phone]    This document serves as a record of services personally performed by Lurline Del, MD. It was created on his behalf by Steva Colder, a trained medical scribe. The creation of this record is based on the scribe's personal observations and the provider's statements to them.   I have reviewed the above documentation for accuracy and completeness, and I agree with the above.

## 2017-06-19 ENCOUNTER — Ambulatory Visit: Payer: Medicaid Other

## 2017-06-19 ENCOUNTER — Telehealth: Payer: Self-pay | Admitting: Oncology

## 2017-06-19 ENCOUNTER — Inpatient Hospital Stay (HOSPITAL_BASED_OUTPATIENT_CLINIC_OR_DEPARTMENT_OTHER): Payer: Medicaid Other | Admitting: Oncology

## 2017-06-19 ENCOUNTER — Inpatient Hospital Stay: Payer: Medicaid Other

## 2017-06-19 VITALS — BP 120/77 | HR 78 | Temp 98.7°F | Resp 18 | Ht 64.0 in | Wt 156.0 lb

## 2017-06-19 DIAGNOSIS — C50211 Malignant neoplasm of upper-inner quadrant of right female breast: Secondary | ICD-10-CM

## 2017-06-19 DIAGNOSIS — Z79818 Long term (current) use of other agents affecting estrogen receptors and estrogen levels: Secondary | ICD-10-CM | POA: Diagnosis not present

## 2017-06-19 DIAGNOSIS — Z9221 Personal history of antineoplastic chemotherapy: Secondary | ICD-10-CM

## 2017-06-19 DIAGNOSIS — Z170 Estrogen receptor positive status [ER+]: Secondary | ICD-10-CM | POA: Diagnosis not present

## 2017-06-19 DIAGNOSIS — Z923 Personal history of irradiation: Secondary | ICD-10-CM | POA: Diagnosis not present

## 2017-06-19 LAB — CBC WITH DIFFERENTIAL/PLATELET
Basophils Absolute: 0 10*3/uL (ref 0.0–0.1)
Basophils Relative: 1 %
Eosinophils Absolute: 0.1 10*3/uL (ref 0.0–0.5)
Eosinophils Relative: 3 %
HCT: 41.2 % (ref 34.8–46.6)
Hemoglobin: 13.8 g/dL (ref 11.6–15.9)
Lymphocytes Relative: 43 %
Lymphs Abs: 1.7 10*3/uL (ref 0.9–3.3)
MCH: 31.5 pg (ref 25.1–34.0)
MCHC: 33.5 g/dL (ref 31.5–36.0)
MCV: 94.2 fL (ref 79.5–101.0)
Monocytes Absolute: 0.3 10*3/uL (ref 0.1–0.9)
Monocytes Relative: 8 %
Neutro Abs: 1.8 10*3/uL (ref 1.5–6.5)
Neutrophils Relative %: 45 %
Platelets: 206 10*3/uL (ref 145–400)
RBC: 4.37 MIL/uL (ref 3.70–5.45)
RDW: 13 % (ref 11.2–14.5)
WBC: 4 10*3/uL (ref 3.9–10.3)

## 2017-06-19 LAB — COMPREHENSIVE METABOLIC PANEL
ALT: 15 U/L (ref 0–55)
AST: 26 U/L (ref 5–34)
Albumin: 4.1 g/dL (ref 3.5–5.0)
Alkaline Phosphatase: 68 U/L (ref 40–150)
Anion gap: 7 (ref 3–11)
BUN: 14 mg/dL (ref 7–26)
CO2: 28 mmol/L (ref 22–29)
Calcium: 9.8 mg/dL (ref 8.4–10.4)
Chloride: 105 mmol/L (ref 98–109)
Creatinine, Ser: 0.81 mg/dL (ref 0.60–1.10)
GFR calc Af Amer: >60 mL/min (ref >60)
GFR calc non Af Amer: >60 mL/min (ref >60)
Glucose, Bld: 90 mg/dL (ref 70–140)
Potassium: 3.6 mmol/L (ref 3.5–5.1)
Sodium: 140 mmol/L (ref 136–145)
Total Bilirubin: 0.3 mg/dL (ref 0.2–1.2)
Total Protein: 7.3 g/dL (ref 6.4–8.3)

## 2017-06-19 MED ORDER — GABAPENTIN 300 MG PO CAPS: 300.0000 mg | ORAL_CAPSULE | Freq: Every day | ORAL | 4 refills | Status: DC

## 2017-06-19 NOTE — Telephone Encounter (Signed)
Gave avs and calendar ° °

## 2017-06-20 LAB — FOLLICLE STIMULATING HORMONE: FSH: 7.5 m[IU]/mL

## 2017-06-22 LAB — ESTRADIOL, ULTRA SENS: Estradiol, Sensitive: 59.6 pg/mL

## 2017-06-26 ENCOUNTER — Ambulatory Visit: Payer: Medicaid Other

## 2017-06-28 ENCOUNTER — Inpatient Hospital Stay: Payer: Medicaid Other | Attending: Adult Health

## 2017-06-28 VITALS — BP 110/75 | HR 80 | Temp 98.1°F | Resp 18

## 2017-06-28 DIAGNOSIS — D259 Leiomyoma of uterus, unspecified: Secondary | ICD-10-CM | POA: Insufficient documentation

## 2017-06-28 DIAGNOSIS — Z170 Estrogen receptor positive status [ER+]: Secondary | ICD-10-CM | POA: Insufficient documentation

## 2017-06-28 DIAGNOSIS — B373 Candidiasis of vulva and vagina: Secondary | ICD-10-CM | POA: Diagnosis not present

## 2017-06-28 DIAGNOSIS — C50211 Malignant neoplasm of upper-inner quadrant of right female breast: Secondary | ICD-10-CM | POA: Diagnosis not present

## 2017-06-28 DIAGNOSIS — Z79811 Long term (current) use of aromatase inhibitors: Secondary | ICD-10-CM | POA: Diagnosis not present

## 2017-06-28 DIAGNOSIS — Z95828 Presence of other vascular implants and grafts: Secondary | ICD-10-CM

## 2017-06-28 MED ORDER — GOSERELIN ACETATE 3.6 MG ~~LOC~~ IMPL
DRUG_IMPLANT | SUBCUTANEOUS | Status: AC
  Filled 2017-06-28: qty 3.6

## 2017-06-28 MED ORDER — GOSERELIN ACETATE 3.6 MG ~~LOC~~ IMPL
3.6000 mg | DRUG_IMPLANT | SUBCUTANEOUS | Status: DC
  Administered 2017-06-28: 3.6 mg via SUBCUTANEOUS

## 2017-07-05 ENCOUNTER — Ambulatory Visit
Admission: RE | Admit: 2017-07-05 | Discharge: 2017-07-05 | Disposition: A | Discharge status: Home / Self Care | Payer: Medicaid Other | Source: Ambulatory Visit | Attending: Family Medicine | Admitting: Family Medicine

## 2017-07-05 ENCOUNTER — Other Ambulatory Visit: Payer: Self-pay | Admitting: Family Medicine

## 2017-07-05 DIAGNOSIS — M545 Low back pain: Secondary | ICD-10-CM

## 2017-07-07 ENCOUNTER — Telehealth: Payer: Self-pay | Admitting: *Deleted

## 2017-07-07 NOTE — Telephone Encounter (Signed)
Called and moved the patient's appt up to Monday April 15th

## 2017-07-10 ENCOUNTER — Inpatient Hospital Stay (HOSPITAL_BASED_OUTPATIENT_CLINIC_OR_DEPARTMENT_OTHER): Payer: Medicaid Other | Admitting: Obstetrics

## 2017-07-10 ENCOUNTER — Encounter: Payer: Self-pay | Admitting: Obstetrics

## 2017-07-10 ENCOUNTER — Telehealth: Payer: Self-pay | Admitting: *Deleted

## 2017-07-10 VITALS — BP 135/70 | HR 60 | Temp 97.2°F | Resp 18 | Wt 159.0 lb

## 2017-07-10 DIAGNOSIS — B373 Candidiasis of vulva and vagina: Secondary | ICD-10-CM

## 2017-07-10 DIAGNOSIS — Z170 Estrogen receptor positive status [ER+]: Secondary | ICD-10-CM

## 2017-07-10 DIAGNOSIS — C50211 Malignant neoplasm of upper-inner quadrant of right female breast: Secondary | ICD-10-CM | POA: Diagnosis not present

## 2017-07-10 DIAGNOSIS — D259 Leiomyoma of uterus, unspecified: Secondary | ICD-10-CM

## 2017-07-10 DIAGNOSIS — Z79811 Long term (current) use of aromatase inhibitors: Secondary | ICD-10-CM | POA: Diagnosis not present

## 2017-07-10 MED ORDER — FLUCONAZOLE 150 MG PO TABS: 150.0000 mg | ORAL_TABLET | Freq: Every day | ORAL | 1 refills | Status: AC

## 2017-07-10 NOTE — Telephone Encounter (Signed)
Fax medical release to [facility].

## 2017-07-10 NOTE — Patient Instructions (Signed)
1. Diflucan will be Rx'd to your pharmacy with one refill. Avoid alcohol while using. 2. Transvaginal ultrasound to evaluate the possibility of fibroids 3. Return in one month to review above. 4. In the meantime, return to [facility] to discuss risks/benefits of continuing Goserelin versus BSO with regard to breast cancer risks.

## 2017-07-10 NOTE — Progress Notes (Signed)
Consult Note: Gyn-Onc  Consult was requested by Dr. Jana Hakim for the evaluation of Candice [name] 42 y.o. female  CC:  Chief Complaint  Patient presents with  . Estrogen receptor positive    ER Positive Breast Cancer    HPI: [name]  is a very nice 42 y.o.  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  This was 95% estrogen receptor positive; 100% progesterone receptor positive.  Treatment for her breast cancer including surgery chemotherapy and radiation.  She states she completed all treatment September 2018.  She was on tamoxifen for some time which was then switched to anastrozole she states after she had a menstrual cycle in January 2019.  Her ovaries are suppressed currently by monthly goserelin.  She presents for discussion of the possibility of BSO for permanent surgical suppression.  She has some questions today regarding breast cancer recurrence risks when comparing BSO to her current regimen.  She does have some complaints today of vaginal dryness and possible yeast infection.  She admits to a past history of a fibroid uterus and myomectomy.  She has had [facility] genetics panel 04/27/16 and that found no deleterious mutations.  Measurement of disease:  No results for input(s): CA125, CAN125, CEA, CA199, ESTRADIOL, INHBB in the last 8760 hours.  Invalid input(s): INHIBINA  . Defer to Medical Oncology   Radiology (relevant to GYN visit)  04/2004  ULTRASOUND OF THE PELVIS:  Transabdominal and transvaginal ultrasound of the pelvis were performed. The uterus is prominent measuring 12.2 cm sagittally with a depth of 10.5 cm and width of 8.6 cm. The uterus is retroflexed. The endometrium measures 8.8 mm in thickness. However there is a large fibroid emanating from the posterior aspect of the upper midbody of the uterus measuring 8.2 x 8.5 x 9.1 cm. Complex cyst is noted emanating from the right ovary measuring 4.2 x  2.9 x 3.4 cm. With the diffuse internal echogenicity of this cyst this may represent hemorrhagic cyst or possibly endometrioma in the proper clinical setting. Followup ultrasound in two months is recommended to assess this area further. The right ovary measures 5.1 x 3.8 x 4.2 cm. The left ovary measures 3.9 x 1.8 x 2.9 cm with small subcentimeter follicular cysts present. Only a small amount of free fluid is noted.   IMPRESSION:  1. Large fibroid emanates from the posterior upper body of the uterus measuring 8.2 x 9.5 x 9.1 cm.   2. Smaller fibroid near the fundus of 2.4 x 1.8 x 3.1 cm.   3. Complex cyst in the right ovary of 4.2 x 2.9 x 3.4 cm. See above.    Oncologic History:      Malignant neoplasm of upper-inner quadrant of right breast in female, estrogen receptor positive ([place])   04/21/2016 Initial Biopsy    Right breast upper inner quadrant biopsy: IDC, grade 2, ER/PR positive, Her-2 negative, Ki-67 20%.      04/27/2016 Initial Diagnosis    Malignant neoplasm of upper-inner quadrant of right breast in female, estrogen receptor positive ([place])      04/27/2016 -  Anti-estrogen oral therapy    Tamoxifen started, then stopped at start of chemotherapy and resumed in 12/26/2016, Goserelin started on 05/01/2017, to change to Anastrozole after 2-3 doses of Goserelin      05/09/2016 Genetic Testing    Patient has genetic testing done for personal and family history of breast cancer. Negative genetic testing  on the [facility] panel.  The [facility] gene panel offered by [employer] includes sequencing and deletion/duplication testing of the following 28 genes: APC, ATM, BARD1, BMPR1A, BRCA1, BRCA2, BRIP1, CHD1, CDK4, CDKN2A, CHEK2, EPCAM (large rearrangement only), MLH1, MSH2, MSH6, MUTYH, NBN, PALB2, PMS2, PTEN, RAD51C, RAD51D, SMAD4, STK11, and TP53. Sequencing was performed for select regions of POLE and POLD1, and large rearrangement analysis was performed for select regions of  GREM1.       06/07/2016 Surgery    Right lumpectomy and SLNB: IDC, grade 2, 1.8 cm, margins neg, 3 SLN biopsied, 1 with metastatic disease, 1 with micrometastatic disease      06/07/2016 Miscellaneous    Mammaprint high risk, indicated need for chemo      07/12/2016 - 09/27/2016 Adjuvant Chemotherapy    Doxorubicin and cyclophosphamide x 4 followed by 2 of weekly Paclitaxel stopped due to reaction, followed by 1 of Abraxane stopped for the same reason.      11/01/2016 - 12/19/2016 Radiation Therapy     1. Right Breast, axilla, SCV: 50 Gy in 25 fractions                          2. Right Breast Boost: 10 Gy in 5 fractions          Current Meds:  Outpatient Encounter Medications as of 07/10/2017  Medication Sig  . anastrozole (ARIMIDEX) 1 MG tablet Take 1 tablet (1 mg total) by mouth daily.  Marland Kitchen gabapentin (NEURONTIN) 300 MG capsule Take 1 capsule (300 mg total) by mouth at bedtime.  . lidocaine-prilocaine (EMLA) cream Apply 1 application topically as needed.   No facility-administered encounter medications on file as of 07/10/2017.     Allergy:  Allergies  Allergen Reactions  . Paclitaxel Itching  . Amoxicillin Rash    Social Hx:   Social History   Socioeconomic History  . Marital status: Single    Spouse name: Not on file  . Number of children: Not on file  . Years of education: Not on file  . Highest education level: Not on file  Occupational History  . Not on file  Social Needs  . Financial resource strain: Not on file  . Food insecurity:    Worry: Not on file    Inability: Not on file  . Transportation needs:    Medical: Not on file    Non-medical: Not on file  Tobacco Use  . Smoking status: Never Smoker  . Smokeless tobacco: Never Used  Substance and Sexual Activity  . Alcohol use: Yes    Alcohol/week: 0.6 oz    Types: 1 Glasses of wine per week    Comment: socially  . Drug use: No  . Sexual activity: Not Currently    Partners: Male    Birth  control/protection: None  Lifestyle  . Physical activity:    Days per week: Not on file    Minutes per session: Not on file  . Stress: Not on file  Relationships  . Social connections:    Talks on phone: Not on file    Gets together: Not on file    Attends religious service: Not on file    Active member of club or organization: Not on file    Attends meetings of clubs or organizations: Not on file    Relationship status: Not on file  . Intimate partner violence:    Fear of current or ex partner: Not on  file    Emotionally abused: Not on file    Physically abused: Not on file    Forced sexual activity: Not on file  Other Topics Concern  . Not on file  Social History Narrative  . Not on file    Past Surgical Hx:  Past Surgical History:  Procedure Laterality Date  . BARTHOLIN CYST MARSUPIALIZATION  09/04/2002  . BARTHOLIN GLAND CYST EXCISION Left 07/13/2005  . BREAST LUMPECTOMY Right   . BREAST LUMPECTOMY WITH RADIOACTIVE SEED AND SENTINEL LYMPH NODE BIOPSY Right 06/07/2016   Procedure: BREAST LUMPECTOMY WITH RADIOACTIVE SEED AND SENTINEL LYMPH NODE BIOPSY;  Surgeon: Alphonsa Overall, MD;  Location: [facility];  Service: General;  Laterality: Right;  . HYSTEROSCOPY  2016  . MYOMECTOMY  07/20/2004  . OVARIAN CYST REMOVAL Right 07/20/2004  . PORTACATH PLACEMENT N/A 07/05/2016   Procedure: INSERTION PORT-A-CATH WITH [place];  Surgeon: Alphonsa Overall, MD;  Location: [place];  Service: General;  Laterality: N/A;    Past Medical Hx:  Past Medical History:  Diagnosis Date  . Breast cancer ([place]) 05/2016   right  . Chronic constipation   . Family history of breast cancer   . Family history of ovarian cancer   . Family history of pancreatic cancer   . Family history of prostate cancer   . IBS (irritable bowel syndrome)   . Personal history of chemotherapy   . Personal history of radiation therapy     Past Gynecological History:   GYNECOLOGIC HISTORY:   No LMP recorded. (Menstrual status: Chemotherapy). Menarche: 43 years old P 0 LMP January 2019 Contraceptive oral contraceptives greater than 10 years not currently used HRT relative only to her breast cancer treatment  Last Pap per patient obtained in the health department 2017 or 2018  Family Hx:  Family History  Problem Relation Age of Onset  . Breast cancer Mother 25  . Pancreatic cancer Paternal Grandmother   . Prostate cancer Paternal Grandfather   . Ovarian cancer Other        MGMs sister  . Breast cancer Other        mother's maternal first cousin    Review of Systems:  Review of Systems  HENT: Negative.   Eyes: Negative.   Respiratory: Negative.   Cardiovascular: Negative.   Genitourinary: Negative.   Musculoskeletal: Positive for back pain.  Skin: Negative.   Neurological: Negative.   Psychiatric/Behavioral: Negative.    General - weight gain and night sweats GI - bloating Gyn - vaginal itching/dryness Endocrine - hot flashes  Vitals:  Blood pressure 135/70, pulse 60, temperature (!) 97.2 F (36.2 C), temperature source Oral, resp. rate 18, weight 159 lb (72.1 kg), SpO2 100 %. Body mass index is 27.29 kg/m.   Physical Exam: ECOG PERFORMANCE STATUS: 0 - Asymptomatic   General :  Well developed, 42 y.o., female in no apparent distress HEENT:  Normocephalic/atraumatic, symmetric, EOMI, eyelids normal Neck:   Supple, no masses.  Lymphatics:  No cervical/ submandibular/ supraclavicular/ infraclavicular/ inguinal adenopathy Respiratory:  Respirations unlabored, no use of accessory muscles CV:   Deferred Breast:  Deferred Musculoskeletal: No CVA tenderness, normal muscle strength. Abdomen:  Soft, non-tender and nondistended. No evidence of hernia. No masses. Extremities:  No lymphedema, no erythema, non-tender. Skin:   Normal inspection Neuro/Psych:  No focal motor deficit, no abnormal mental status. Normal gait. Normal affect. Alert and oriented to  person, place, and time  Genito Urinary: Vulva: Normal external female genitalia. Slight defect ~  3:00 introitus due to Bartholins surgery. Bladder/urethra: Urethral meatus normal in size and location. No lesions or   masses, well supported bladder Speculum exam: Vagina: +thick white discharge No lesion, no bleeding. Cervix: Normal appearing, no lesions. Bimanual exam:  Uterus: difficult to determine given probable fibroids, mobile.  Adnexa: +mass in culdesac, posterior to uterus and low.  Rectovaginal:  Good tone, + impingement from mass that feels to be attached to uterus. No cul de sac nodularity, no parametrial involvement or nodularity.   Oncologic Summary: 1. Right breast cancer, hormone receptor positive a. S/p surgery, chemo, radiation b. Currently on ovarian suppression/anastrozole  Assessment/Plan: 1. She appears to have a yeast infection o Diflucan prescription was prescribed 2. On exam she has a palpable mass I suspect this is due to her fibroid seen on prior imaging o Recommend we get a transvaginal ultrasound and after the patient left to review to 2006 ultrasound that found a cyst in her adnexa.  Presumably this was followed up by the provider that ordered at that time but we will go ahead and recheck the adnexa with the repeat ultrasound 3. Recurrence risk for breast cancer o She had some questions about remaining on the current regimen versus BSO that is outside of my scope of practice o I encouraged her to follow back with medical oncology to answer these questions 4. Return to clinic in 1 month o At that time we can determine whether she wishes to proceed with BSO and will discuss surgical risk at that time o Obtain Pap from [facility] 5. Procedure discussion o We did review that the procedure could be performed laparoscopically but I would still recommend 4-6 weeks off work.  These are her main question specific to the procedure at this time o I did discuss that  she would be in permanent menopause after surgery and that we would not prescribe hormonal therapy following this    Isabel Caprice, MD  07/10/2017, 12:37 PM  Cc: Raiford Simmonds, MD Lurline Del, MD

## 2017-07-11 ENCOUNTER — Encounter: Payer: Self-pay | Admitting: Obstetrics

## 2017-07-13 ENCOUNTER — Ambulatory Visit (HOSPITAL_COMMUNITY): Payer: Medicaid Other

## 2017-07-13 ENCOUNTER — Other Ambulatory Visit: Payer: Self-pay | Admitting: Family Medicine

## 2017-07-13 DIAGNOSIS — M5136 Other intervertebral disc degeneration, lumbar region: Secondary | ICD-10-CM

## 2017-07-14 ENCOUNTER — Ambulatory Visit (HOSPITAL_COMMUNITY): Payer: Medicaid Other

## 2017-07-14 ENCOUNTER — Ambulatory Visit (HOSPITAL_COMMUNITY)
Admission: RE | Admit: 2017-07-14 | Discharge: 2017-07-14 | Disposition: A | Discharge status: Home / Self Care | Payer: Medicaid Other | Source: Ambulatory Visit | Attending: Gynecologic Oncology | Admitting: Gynecologic Oncology

## 2017-07-14 DIAGNOSIS — N83291 Other ovarian cyst, right side: Secondary | ICD-10-CM | POA: Diagnosis not present

## 2017-07-14 DIAGNOSIS — C50911 Malignant neoplasm of unspecified site of right female breast: Secondary | ICD-10-CM | POA: Diagnosis not present

## 2017-07-14 DIAGNOSIS — D250 Submucous leiomyoma of uterus: Secondary | ICD-10-CM | POA: Insufficient documentation

## 2017-07-14 DIAGNOSIS — Z170 Estrogen receptor positive status [ER+]: Secondary | ICD-10-CM

## 2017-07-14 DIAGNOSIS — C50211 Malignant neoplasm of upper-inner quadrant of right female breast: Secondary | ICD-10-CM

## 2017-07-17 ENCOUNTER — Ambulatory Visit (HOSPITAL_COMMUNITY): Payer: Medicaid Other

## 2017-07-18 ENCOUNTER — Telehealth: Payer: Self-pay

## 2017-07-18 ENCOUNTER — Ambulatory Visit
Admission: RE | Admit: 2017-07-18 | Discharge: 2017-07-18 | Disposition: A | Discharge status: Home / Self Care | Payer: Medicaid Other | Source: Ambulatory Visit | Attending: Family Medicine | Admitting: Family Medicine

## 2017-07-18 DIAGNOSIS — M5136 Other intervertebral disc degeneration, lumbar region: Secondary | ICD-10-CM

## 2017-07-18 NOTE — Telephone Encounter (Signed)
LM for Ms Greenfield to call back to the office for results of [place].

## 2017-07-19 ENCOUNTER — Ambulatory Visit: Payer: Medicaid Other | Admitting: Obstetrics

## 2017-07-19 ENCOUNTER — Telehealth: Payer: Self-pay

## 2017-07-19 NOTE — Telephone Encounter (Signed)
Patient returned our call to receive her recent u/s results per Joylene John NP, "let her know fibroids and small cyst on right ovary, nothing overly concerning"  Pt with questions regarding sizes of fibroids and how many?  Per u/s impression: multiple uterine leiomyomata largest 5.6 cm diameter.  Pt voiced understanding. Explained the report would be on mychart and reminded her of her appt on 08-01-2017 with Dr Gerarda Fraction to go over in detail.  Offered for NP to call her back and discuss if needed.  Pt said she is fine and will discuss at the upcoming appt. Also reminded her of upcoming appt with Dr Jana Hakim on 5/17. No other needs per pt at this time.

## 2017-07-24 ENCOUNTER — Telehealth: Payer: Self-pay | Admitting: Oncology

## 2017-07-24 NOTE — Telephone Encounter (Signed)
Called pt re moving appt - spoke w/ pt re appts.

## 2017-07-25 ENCOUNTER — Ambulatory Visit: Payer: Medicaid Other | Attending: Adult Health | Admitting: Physical Therapy

## 2017-07-25 ENCOUNTER — Ambulatory Visit: Payer: Medicaid Other | Admitting: Oncology

## 2017-07-25 ENCOUNTER — Other Ambulatory Visit: Payer: Self-pay

## 2017-07-25 ENCOUNTER — Encounter: Payer: Self-pay | Admitting: Physical Therapy

## 2017-07-25 DIAGNOSIS — M6281 Muscle weakness (generalized): Secondary | ICD-10-CM | POA: Diagnosis present

## 2017-07-25 DIAGNOSIS — G8929 Other chronic pain: Secondary | ICD-10-CM | POA: Insufficient documentation

## 2017-07-25 DIAGNOSIS — M545 Low back pain, unspecified: Secondary | ICD-10-CM

## 2017-07-25 NOTE — Patient Instructions (Addendum)
   Abdominal brace      Try a pillow under hips for sleeping on your stomach.   Pillow between knees in sidelying.  Pillow behind knees when lying on your back.     Isometric Hold (Quadruped)   On hands and knees, slowly inhale, and then exhale. Pull navel toward spine and Hold for __5_ seconds. Continue to breathe in and out during hold. Rest for __5_ seconds. Repeat ___ times. Do 1___ times a day.   Copyright  VHI. All rights reserved.  Bracing With Arm Raise (Quadruped)   On hands and knees find neutral spine. Tighten pelvic floor and abdominals and hold. Alternately lift arm to shoulder level. Repeat _5__ times. Do __1_ times a day.   Copyright  VHI. All rights reserved.  Bracing With Leg Raise (Quadruped)   On hands and knees find neutral spine. Tighten pelvic floor and abdominals and hold. Alternating legs, straighten and lift to hip level. Repeat __5_ times. Do _1__ times a day. Copyright  VHI. All rights reserved.  Bracing With Arm / Leg Raise (Quadruped)   On hands and knees find neutral spine. Tighten pelvic floor and abdominals and hold. Alternating, lift arm to shoulder level and opposite leg to hip level. Repeat __5_ times. Do 1___ times a day.   Copyright  VHI. All rights reserved.  Ruben Im PT [facility] [address] [place], [place] [address] Phone # [phone] Fax [phone]

## 2017-07-25 NOTE — Therapy (Signed)
[facility] [address] [place], [place], [address] Phone: [phone]   Fax:  [phone]  Physical Therapy Evaluation  Patient Details  Name: Candice [name] MRN: [id number] Date of Birth: 02/21/1976 Referring Provider: Dr. Jonathon Jordan   Encounter Date: 07/25/2017  PT End of Session - 07/25/17 1937    Visit Number  1    Date for PT Re-Evaluation  09/19/17    Authorization Type  submit for Medicaid authorization    PT Start Time  7902    PT Stop Time  1530    PT Time Calculation (min)  45 min    Activity Tolerance  Patient tolerated treatment well       Past Medical History:  Diagnosis Date  . Breast cancer ([place]) 05/2016   right  . Chronic constipation   . Family history of cancer   . IBS (irritable bowel syndrome)    IBS - C (constipation)  . Personal history of chemotherapy   . Personal history of radiation therapy     Past Surgical History:  Procedure Laterality Date  . BARTHOLIN CYST MARSUPIALIZATION  09/04/2002  . BARTHOLIN GLAND CYST EXCISION Left 07/13/2005  . BREAST LUMPECTOMY WITH RADIOACTIVE SEED AND SENTINEL LYMPH NODE BIOPSY Right 06/07/2016   Procedure: BREAST LUMPECTOMY WITH RADIOACTIVE SEED AND SENTINEL LYMPH NODE BIOPSY;  Surgeon: Alphonsa Overall, MD;  Location: [facility];  Service: General;  Laterality: Right;  . HYSTEROSCOPY  2016  . MYOMECTOMY  07/20/2004   Laparotomy  . OVARIAN CYST REMOVAL Right 07/20/2004  . PORTACATH PLACEMENT N/A 07/05/2016   REMOVED Fall 2018 ; Procedure: INSERTION PORT-A-CATH WITH [place];  Surgeon: Alphonsa Overall, MD;  Location: [facility];  Service: General;  Laterality: N/A;    There were no vitals filed for this visit.   Subjective Assessment - 07/25/17 1449    Subjective  2 years ago, prior to breast cancer diagnosis, had a pop when getting out of bed but pain is occurring now more frequent.  Throbbing pain across back.  Unable  to lie prone.      Pertinent History  Diagnosed with right breast cancer 04/21/16.  Had lumpectomy with sentinel lymph node biopsy in March; nodes were positive.  Had adjuvant chemo May-July2018, then had radiation August-September 2018.  Is on tamoxifen and getting injections to stop her periods, once every six months. Noticed some swelling right after the surgery and recently has been feeling it; reports tenderness too, at right axilla and inferior to that. Otherwise healthy except for IBS x 2 years.    Limitations  House hold activities;Other (comment) sleeping    How long can you sit comfortably?  no problem     How long can you walk comfortably?  no difficulty     Diagnostic tests  mild disc bulge withou nerve impingement     Patient Stated Goals  to manage this back issue so I can sleep on my stomach    Currently in Pain?  No/denies    Pain Score  0-No pain    Pain Location  Back    Pain Orientation  Lower;Right;Left    Pain Type  Chronic pain    Pain Onset  More than a month ago    Pain Frequency  Intermittent    Aggravating Factors   prone lying    Pain Relieving Factors  sidelying          [facility] PT Assessment - 07/25/17 0001  Assessment   Medical Diagnosis  mild lumbar disc bulge    Referring Provider  Dr. Jonathon Jordan    Onset Date/Surgical Date  -- 2 years    Hand Dominance  Right    Next MD Visit  will see soon for knee pain      Precautions   Precaution Comments  cancer precautions      Restrictions   Weight Bearing Restrictions  No      Balance Screen   Has the patient fallen in the past 6 months  No    Has the patient had a decrease in activity level because of a fear of falling?   No    Is the patient reluctant to leave their home because of a fear of falling?   No      Home Environment   Living Environment  Private residence    Type of [place]  Two level      Prior Function   Level of [place]  Part  time employment    Vocation Requirements  professor in Estate agent    Leisure  discontinued gym b/c of LE injury ; sports       Posture/Postural Control   Posture/Postural Control  Postural limitations    Postural Limitations  Increased lumbar lordosis      AROM   Overall AROM Comments  full hip ROM    Lumbar Flexion  70 able to touch toes    Lumbar Extension  20 painful    Lumbar - Right Side Bend  40    Lumbar - Left Side Bend  40      Strength   Right Hip Flexion  5/5    Right Hip ABduction  5/5    Left Hip Flexion  5/5    Left Hip ABduction  5/5    Lumbar Flexion  4/5 difficulty activating transverse abdominus holdsbreath    Lumbar Extension  4/5 hyperextends lumbar spine in quadruped      Flexibility   Soft Tissue Assessment /Muscle Length  yes    Hamstrings  90 degrees bil      Palpation   Palpation comment  no tenderness      Slump test   Findings  Negative      Straight Leg Raise   Findings  Negative                Objective measurements completed on examination: See above findings.              PT Education - 07/25/17 1936    Education provided  Yes    Education Details  abdominal brace; bird dogs;  lying prone over 1 pillow;  discussed sleep positioning     Person(s) Educated  Patient    Methods  Explanation;Demonstration;Handout    Comprehension  Verbalized understanding;Returned demonstration       PT Short Term Goals - 07/25/17 1951      PT SHORT TERM GOAL #1   Title  STGs=LTGs        PT Long Term Goals - 07/25/17 1951      PT LONG TERM GOAL #1   Title  The patient will be independent in an appropriate HEP to address deficits related to LBP    Baseline  Patient is currently not doing any ex and lacks knowledge of appropriate HEP    Time  8  Period  Weeks    Status  New    Target Date  09/19/17      PT LONG TERM GOAL #2   Title  The patient will report a 50% improvement in back pain at night for better  sleep    Time  8    Period  Weeks    Status  New      PT LONG TERM GOAL #3   Title  Lumbar/core strength at least 4+/5 needed for improved function with less pain    Time  8    Period  Weeks    Status  New      PT LONG TERM GOAL #4   Title  The patient will report a good understanding of postural correction to avoid excessive lumbar lordotic posture     Time  8    Period  Weeks    Status  New             Plan - 07/25/17 1937    Clinical Impression Statement  The patient reports a 2 year history of low back pain which has progressively worsened after being diagnosed with breast cancer in 2018 and undergoing treatment.  She reports she is having difficulty sleeping because she is unable to lie prone (her favorite position).  Hyperlordosis in lumbar spine noted.  Full lumbar ROM but painful with lumbar extension.  Full hip mobility and LE muscle lengths with general hypermobility noted.  She has difficulty activating her lower abdominals without holding her breath.  She also has difficulty maintaining neutral spine in standing and quadruped.  She has not returned to any kind of exercise program since her cancer diagnosis and complains of a 30# weight gain.   She would benefit from PT to address these deficits.      History and Personal Factors relevant to plan of care:  recent breast CA    Clinical Presentation  Stable    Clinical Decision Making  Low    Rehab Potential  Good    Clinical Impairments Affecting Rehab Potential  no U/S     PT Frequency  1x / week    PT Duration  8 weeks    PT Treatment/Interventions  ADLs/Self Care Home Management;Cryotherapy;Electrical Stimulation;Therapeutic activities;Therapeutic exercise;Neuromuscular re-education;Patient/family education;Dry needling;Taping    PT Next Visit Plan  lumbo/pelvic core strengthening;  follow up on sleeping positioning    Consulted and Agree with Plan of Care  Patient       Patient will benefit from skilled  therapeutic intervention in order to improve the following deficits and impairments:  Pain, Decreased strength, Postural dysfunction, Hypermobility  Visit Diagnosis: Chronic bilateral low back pain without sciatica - Plan: PT plan of care cert/re-cert  Muscle weakness (generalized) - Plan: PT plan of care cert/re-cert     Problem List Patient Active Problem List   Diagnosis Date Noted  . Port catheter in place 09/06/2016  . Genetic testing 05/09/2016  . Malignant neoplasm of upper-inner quadrant of right breast in female, estrogen receptor positive ([place]) 04/27/2016  . Family history of breast cancer   . Family history of ovarian cancer   . Family history of prostate cancer   . Family history of pancreatic cancer    Ruben Im, PT 07/25/17 8:02 PM Phone: [phone] Fax: [phone]  Alvera Singh 07/25/2017, 8:00 PM  [facility] [address], [place], [place], [address] Phone: [phone]   Fax:  [phone]  Name:  Candice [name] MRN: [id number] Date of Birth: 23-Apr-1975

## 2017-07-26 ENCOUNTER — Inpatient Hospital Stay: Payer: Medicaid Other | Attending: Adult Health

## 2017-07-26 ENCOUNTER — Ambulatory Visit: Payer: Medicaid Other

## 2017-07-26 VITALS — BP 130/75 | HR 75 | Temp 97.9°F | Resp 18

## 2017-07-26 DIAGNOSIS — C50211 Malignant neoplasm of upper-inner quadrant of right female breast: Secondary | ICD-10-CM | POA: Insufficient documentation

## 2017-07-26 DIAGNOSIS — N951 Menopausal and female climacteric states: Secondary | ICD-10-CM | POA: Insufficient documentation

## 2017-07-26 DIAGNOSIS — Z95828 Presence of other vascular implants and grafts: Secondary | ICD-10-CM

## 2017-07-26 DIAGNOSIS — D259 Leiomyoma of uterus, unspecified: Secondary | ICD-10-CM | POA: Insufficient documentation

## 2017-07-26 DIAGNOSIS — G4700 Insomnia, unspecified: Secondary | ICD-10-CM | POA: Diagnosis not present

## 2017-07-26 DIAGNOSIS — R635 Abnormal weight gain: Secondary | ICD-10-CM | POA: Insufficient documentation

## 2017-07-26 DIAGNOSIS — D250 Submucous leiomyoma of uterus: Secondary | ICD-10-CM | POA: Diagnosis not present

## 2017-07-26 DIAGNOSIS — N898 Other specified noninflammatory disorders of vagina: Secondary | ICD-10-CM | POA: Insufficient documentation

## 2017-07-26 DIAGNOSIS — Z5111 Encounter for antineoplastic chemotherapy: Secondary | ICD-10-CM | POA: Insufficient documentation

## 2017-07-26 DIAGNOSIS — Z170 Estrogen receptor positive status [ER+]: Secondary | ICD-10-CM

## 2017-07-26 MED ORDER — GOSERELIN ACETATE 3.6 MG ~~LOC~~ IMPL
3.6000 mg | DRUG_IMPLANT | SUBCUTANEOUS | Status: DC
  Administered 2017-07-26: 3.6 mg via SUBCUTANEOUS

## 2017-07-26 MED ORDER — GOSERELIN ACETATE 3.6 MG ~~LOC~~ IMPL
DRUG_IMPLANT | SUBCUTANEOUS | Status: AC
Start: 2017-07-26 — End: 2017-07-26
  Filled 2017-07-26: qty 3.6

## 2017-08-01 ENCOUNTER — Ambulatory Visit: Payer: Medicaid Other | Admitting: Physical Therapy

## 2017-08-01 ENCOUNTER — Telehealth: Payer: Self-pay | Admitting: *Deleted

## 2017-08-01 ENCOUNTER — Inpatient Hospital Stay: Payer: Medicaid Other | Admitting: Obstetrics

## 2017-08-01 NOTE — Telephone Encounter (Signed)
Patient called and moved her appt from today to May 23rd.

## 2017-08-03 ENCOUNTER — Ambulatory Visit: Payer: Medicaid Other | Attending: Adult Health | Admitting: Physical Therapy

## 2017-08-03 DIAGNOSIS — M545 Low back pain, unspecified: Secondary | ICD-10-CM

## 2017-08-03 DIAGNOSIS — G8929 Other chronic pain: Secondary | ICD-10-CM | POA: Insufficient documentation

## 2017-08-03 DIAGNOSIS — M6281 Muscle weakness (generalized): Secondary | ICD-10-CM | POA: Diagnosis present

## 2017-08-03 NOTE — Patient Instructions (Signed)
     prone multifidus activation over 2 pillows  Prone pelvic press  10x right and left each Prone pelvic press with knee flex  Right and left 10 x each and then bilaterally x 10 Prone pelvic press with hip extension right and left 10 x each Prone pelvic press with knee flex and hip ext Right and left 10 times each       Ruben Im PT [facility] [address] [place], [place] [address] Phone # [phone] Fax [phone]

## 2017-08-03 NOTE — Therapy (Signed)
[facility] [address] [place], [place], [address] Phone: [phone]   Fax:  [phone]  Physical Therapy Treatment  Patient Details  Name: Candice [name] MRN: [id number] Date of Birth: [birth year] Referring Provider: Dr. Jonathon Jordan   Encounter Date: 08/03/2017  PT End of Session - 08/03/17 1753    Visit Number  2    Number of Visits  4    Date for PT Re-Evaluation  09/19/17    Authorization Type  Medicaid authorized 3 visits(not included eval) 5/1-5/28/19    PT Start Time  5188    PT Stop Time  1530    PT Time Calculation (min)  45 min    Activity Tolerance  Patient tolerated treatment well       Past Medical History:  Diagnosis Date  . Breast cancer ([place]) 05/2016   right  . Chronic constipation   . Family history of cancer   . IBS (irritable bowel syndrome)    IBS - C (constipation)  . Personal history of chemotherapy   . Personal history of radiation therapy     Past Surgical History:  Procedure Laterality Date  . BARTHOLIN CYST MARSUPIALIZATION  09/04/2002  . BARTHOLIN GLAND CYST EXCISION Left 07/13/2005  . BREAST LUMPECTOMY WITH RADIOACTIVE SEED AND SENTINEL LYMPH NODE BIOPSY Right 06/07/2016   Procedure: BREAST LUMPECTOMY WITH RADIOACTIVE SEED AND SENTINEL LYMPH NODE BIOPSY;  Surgeon: Alphonsa Overall, MD;  Location: [facility];  Service: General;  Laterality: Right;  . HYSTEROSCOPY  2016  . MYOMECTOMY  07/20/2004   Laparotomy  . OVARIAN CYST REMOVAL Right 07/20/2004  . PORTACATH PLACEMENT N/A 07/05/2016   REMOVED Fall 2018 ; Procedure: INSERTION PORT-A-CATH WITH [place];  Surgeon: Alphonsa Overall, MD;  Location: [facility];  Service: General;  Laterality: N/A;    There were no vitals filed for this visit.  Subjective Assessment - 08/03/17 1443    Subjective  Feeling good.  The pain has not flared up.  If I sit wrong at work.  Mostly when I sleep on my stomach is when  it hurts.  Mostly going cardio at the gym.      Pertinent History  Diagnosed with right breast cancer 04/21/16.  Had lumpectomy with sentinel lymph node biopsy in March; nodes were positive.  Had adjuvant chemo May-July2018, then had radiation August-September 2018.  Is on tamoxifen and getting injections to stop her periods, once every six months. Noticed some swelling right after the surgery and recently has been feeling it; reports tenderness too, at right axilla and inferior to that. Otherwise healthy except for IBS x 2 years.    Currently in Pain?  No/denies    Pain Score  0-No pain    Pain Location  Back    Pain Type  Chronic pain    Aggravating Factors   sleeping on my stomach                        [facility] Adult PT Treatment/Exercise - 08/03/17 0001      Lumbar Exercises: Stretches   Other Lumbar Stretch Exercise  childs pose stretch 4x with and without bias       Lumbar Exercises: Aerobic   Nustep  L1 5 min       Lumbar Exercises: Standing   Row  Strengthening;Both;15 reps;Theraband    Shoulder Extension  Strengthening;Left;15 reps;Theraband    Theraband Level (Shoulder Extension)  Level 3 (  Green)    Other Standing Lumbar Exercises  SLS with green band UE diagonals 20x each      Lumbar Exercises: Supine   Ab Set  5 reps    Bent Knee Raise  10 reps    Isometric Hip Flexion  10 reps      Lumbar Exercises: Sidelying   Clam  Right;Both;15 reps    Clam Limitations  green band      Lumbar Exercises: Prone   Other Prone Lumbar Exercises  lumbar multifidi series:   2 pillows pelvic press, HS curl, hip extension, bent knee hip extension 5x each      Lumbar Exercises: Quadruped   Single Arm Raise  Right;Left;5 reps    Straight Leg Raise  5 reps    Opposite Arm/Leg Raise  Right arm/Left leg;Left arm/Right leg;5 reps             PT Education - 08/03/17 1753    Education provided  Yes    Education Details  prone multifidi series    Person(s) Educated   Patient    Methods  Explanation;Demonstration;Handout    Comprehension  Returned demonstration;Verbalized understanding       PT Short Term Goals - 07/25/17 1951      PT SHORT TERM GOAL #1   Title  STGs=LTGs        PT Long Term Goals - 07/25/17 1951      PT LONG TERM GOAL #1   Title  The patient will be independent in an appropriate HEP to address deficits related to LBP    Baseline  Patient is currently not doing any ex and lacks knowledge of appropriate HEP    Time  8    Period  Weeks    Status  New    Target Date  09/19/17      PT LONG TERM GOAL #2   Title  The patient will report a 50% improvement in back pain at night for better sleep    Time  8    Period  Weeks    Status  New      PT LONG TERM GOAL #3   Title  Lumbar/core strength at least 4+/5 needed for improved function with less pain    Time  8    Period  Weeks    Status  New      PT LONG TERM GOAL #4   Title  The patient will report a good understanding of postural correction to avoid excessive lumbar lordotic posture     Time  8    Period  Weeks    Status  New            Plan - 08/03/17 1754    Clinical Impression Statement  The patient continues to need moderate verbal cues to coordinate breathing with abdominal bracing exercises, tending to hold her breath.  Able to activate lumbar multifidi with minimal compensation of gluteals.  Tactile cues to engage abdominals in quadruped.  Good Single leg stand stability.      Rehab Potential  Good    Clinical Impairments Affecting Rehab Potential  no U/S     PT Frequency  1x / week    PT Treatment/Interventions  ADLs/Self Care Home Management;Cryotherapy;Electrical Stimulation;Therapeutic activities;Therapeutic exercise;Neuromuscular re-education;Patient/family education;Dry needling;Taping    PT Next Visit Plan  lumbo/pelvic core strengthening       Patient will benefit from skilled therapeutic intervention in order to improve the following deficits and  impairments:  Pain, Decreased strength, Postural dysfunction, Hypermobility  Visit Diagnosis: Chronic bilateral low back pain without sciatica  Muscle weakness (generalized)     Problem List Patient Active Problem List   Diagnosis Date Noted  . Port catheter in place 09/06/2016  . Genetic testing 05/09/2016  . Malignant neoplasm of upper-inner quadrant of right breast in female, estrogen receptor positive ([place]) 04/27/2016  . Family history of breast cancer   . Family history of ovarian cancer   . Family history of prostate cancer   . Family history of pancreatic cancer    Ruben Im, PT 08/03/17 5:56 PM Phone: [phone] Fax: [phone]  Alvera Singh 08/03/2017, 5:56 PM  [facility] [address] [place], [place], [address] Phone: [phone]   Fax:  [phone]  Name: SHAKEA ISIP MRN: [id number] Date of Birth: 05/15/1975

## 2017-08-09 NOTE — Progress Notes (Signed)
[place]  Telephone:([phone]) [phone] Fax:(336) [phone]     ID: DATRA CLARY DOB: 03-03-1976  MR#: [id number]  FXT#:[id number]  Patient Care Team: Jonathon Jordan, MD as PCP - General (Family Medicine) Alphonsa Overall, MD as Consulting Physician (General Surgery) Magrinat, Virgie Dad, MD as Consulting Physician (Oncology) Eppie Gibson, MD as Attending Physician (Radiation Oncology) Osborne Oman, MD as Attending Physician (Obstetrics and Gynecology) Cheryll Cockayne, MD as Referring Physician (Surgical Oncology) Delice Bison, Charlestine Massed, NP as Nurse Practitioner (Hematology and Oncology) OTHER MD:  CHIEF COMPLAINT: estrogen receptor positive breast cancer  CURRENT TREATMENT: goserelin, and anastrozole   BREAST CANCER HISTORY: From the original intake note:  Candice [name] [facility] [place] in December, at which time a mass in the right breast was noted. She was referred for BCCCP and screening mammography was obtained, showing a possible mass in the right breast.on 04/21/2016 she underwent right diagnostic mammography with tomography and ultrasonography at the breast Center, and this confirmedan irregular spiculat in the right breast measuring 1.8 cm, which was not palpable to the mammographer. Ultrasonography confirmed an irregular hypoechoic right breast mass at the 12:30 o'clock position 4 cm from the nipple measuring 2.2 cm. The right axilla was sonographically benign.  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  Her subsequent history is detailed below  INTERVAL HISTORY: Candice [name] returns today for follow-up and treatment of her estrogen receptor positive breast cancer  accompanied by by her sister. She continues on anastrozole, with good tolerance. She has some occasional hot flashes. She still has some insomnia, but it isn't too bad. She also has some vaginal dryness. She is interested in the pelvis health programs.   She also receives goserelin monthly. She tolerates this well.  She has mild menopausal symptoms but her main concern is weight gain.   Since her last visit, she completed a lumbar spine MRI on 07/18/2017 which showed: Mild annular disc bulge with facet hypertrophy at L4-5 without stenosis or neural impingement. Otherwise unremarkable MRI of the lumbar spine  She also had an ultrasound of the pelvis on 07/14/2017 showing: Multiple uterine leiomyomata largest 5.6 cm diameter. Small simple cyst RIGHT ovary 2.8 cm diameter. She will follow up with her gynecologist in about 2 weeks.    REVIEW OF SYSTEMS: Candice [name] reports that her goal is to lose about 20 lbs. She has started going to the gym and lifting weights and doing cardio exercises. She is careful about what she eats due to constipation and IBS. Her diet consists of brown rice, fruits and vegetables. She eats small snacks in between meals. She also drinks about 3-4 bottles of water per day. She hurt her foot a few weeks ago and she followed up with her PCP.  She is in the process of getting a full-time position at [employer]. She wants to build up sick-time before she gets surgery. She denies unusual headaches, visual changes, nausea, vomiting, or dizziness. There has been no unusual cough, phlegm production, or pleurisy. This been no change in bowel or bladder habits. She denies unexplained fatigue or unexplained weight loss, bleeding, rash, or fever. A detailed review of systems was otherwise stable.     PAST MEDICAL HISTORY: Past Medical History:  Diagnosis Date  . Breast cancer ([place]) 05/2016   right  . Chronic constipation   . Family history of cancer   . IBS (irritable bowel syndrome)     IBS - C (constipation)  . Personal history of chemotherapy   . Personal history of radiation therapy     PAST SURGICAL HISTORY: Past Surgical History:  Procedure Laterality Date  . BARTHOLIN CYST MARSUPIALIZATION  09/04/2002  . BARTHOLIN GLAND CYST EXCISION Left 07/13/2005  . BREAST LUMPECTOMY WITH RADIOACTIVE SEED AND SENTINEL LYMPH NODE BIOPSY Right 06/07/2016   Procedure: BREAST LUMPECTOMY WITH RADIOACTIVE SEED AND SENTINEL LYMPH NODE BIOPSY;  Surgeon: Alphonsa Overall, MD;  Location: [facility];  Service: General;  Laterality: Right;  . HYSTEROSCOPY  2016  . MYOMECTOMY  07/20/2004   Laparotomy  . OVARIAN CYST REMOVAL Right 07/20/2004  . PORTACATH PLACEMENT N/A 07/05/2016   REMOVED Fall 2018 ; Procedure: INSERTION PORT-A-CATH WITH [place];  Surgeon: Alphonsa Overall, MD;  Location: [place];  Service: General;  Laterality: N/A;    FAMILY HISTORY Family History  Problem Relation Age of Onset  . Breast cancer Mother 21  . Pancreatic cancer Paternal Grandmother   . Prostate cancer Paternal Grandfather   . Ovarian cancer Other        MGMs sister  . Breast cancer Other        mother's maternal first cousin  The patient's father, [name], lives in [place] and works as a Geneticist, molecular. The patient's mother is a Quarry manager. The patient has no brothers and 1 sister, who works in [place] as a Network engineer. The patient's mother had breast cancer, stage 0, diagnosed at age 85. There are 2 maternal relatives (mother's sister and mother's niece) with breast cancer, both postmenopausal.  GYNECOLOGIC HISTORY:  Menarche age 51, the patient is GX P0. She has regular periods, most recently mid-January. She is on oral contraceptives at present. No LMP recorded. (Menstrual status: Chemotherapy).   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  She is  applying for a full-time position.  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 She generally lives in [place] by herself although she is currently staying at her parents.    ADVANCED DIRECTIVES: Not in place. The patient tells me she intends to name her sister as her healthcare power of attorney.   HEALTH MAINTENANCE: Social History   Tobacco Use  . Smoking status: Never Smoker  . Smokeless tobacco: Never Used  Substance Use Topics  . Alcohol use: Yes    Alcohol/week: 0.6 oz    Types: 1 Glasses of wine per week    Comment: once per month  . Drug use: No     Colonoscopy: July 2016, in [place]  PAP:  Bone density: Never   Allergies  Allergen Reactions  . Paclitaxel Itching  . Amoxicillin Rash    Current Outpatient Medications  Medication Sig Dispense Refill  . anastrozole (ARIMIDEX) 1 MG tablet Take 1 tablet (1 mg total) by mouth daily. 30 tablet 2  . gabapentin (NEURONTIN) 300 MG capsule Take 1 capsule (300 mg total) by mouth at bedtime. 90 capsule 4  . lidocaine-prilocaine (EMLA) cream Apply 1 application topically as needed. 30 g 0   No current facility-administered medications for this visit.     OBJECTIVE: young African-American woman no acute distress  Vitals:  08/11/17 1549  BP: 131/88  Pulse: 67  Resp: 18  Temp: 98.4 F (36.9 C)  SpO2: 100%     Body mass index is 27.94 kg/m.    ECOG FS: 0 - Asymptomatic Filed Weights   08/11/17 1549  Weight: 162 lb 12.8 oz (73.8 kg)    Sclerae unicteric, pupils round and equal Oropharynx clear and moist No cervical or supraclavicular adenopathy Lungs no rales or rhonchi Heart regular rate and rhythm Abd soft, nontender, positive bowel sounds MSK no focal spinal tenderness, no upper extremity lymphedema Neuro: nonfocal, well oriented, appropriate affect Breasts: The right breast is status post lumpectomy followed by radiation.  There is still some hyperpigmentation but no evidence of disease recurrence.   The left breast is benign.  Both axillae are benign.  LAB RESULTS:  CMP     Component Value Date/Time   NA 140 08/11/2017 1517   NA 142 02/15/2017 1054   K 4.0 08/11/2017 1517   K 4.1 02/15/2017 1054   CL 105 08/11/2017 1517   CO2 28 08/11/2017 1517   CO2 25 02/15/2017 1054   GLUCOSE 84 08/11/2017 1517   GLUCOSE 71 02/15/2017 1054   BUN 10 08/11/2017 1517   BUN 12.3 02/15/2017 1054   CREATININE 0.83 08/11/2017 1517   CREATININE 0.9 02/15/2017 1054   CALCIUM 9.6 08/11/2017 1517   CALCIUM 9.6 02/15/2017 1054   PROT 7.2 08/11/2017 1517   PROT 7.0 02/15/2017 1054   ALBUMIN 4.4 08/11/2017 1517   ALBUMIN 3.8 02/15/2017 1054   AST 27 08/11/2017 1517   AST 25 02/15/2017 1054   ALT 18 08/11/2017 1517   ALT 15 02/15/2017 1054   ALKPHOS 82 08/11/2017 1517   ALKPHOS 62 02/15/2017 1054   BILITOT 0.4 08/11/2017 1517   BILITOT 0.60 02/15/2017 1054   GFRNONAA >60 08/11/2017 1517   GFRAA >60 08/11/2017 1517    INo results found for: SPEP, UPEP  Lab Results  Component Value Date   WBC 3.7 (L) 08/11/2017   NEUTROABS 1.5 08/11/2017   HGB 13.7 08/11/2017   HCT 40.6 08/11/2017   MCV 94.4 08/11/2017   PLT 193 08/11/2017      Chemistry      Component Value Date/Time   NA 140 08/11/2017 1517   NA 142 02/15/2017 1054   K 4.0 08/11/2017 1517   K 4.1 02/15/2017 1054   CL 105 08/11/2017 1517   CO2 28 08/11/2017 1517   CO2 25 02/15/2017 1054   BUN 10 08/11/2017 1517   BUN 12.3 02/15/2017 1054   CREATININE 0.83 08/11/2017 1517   CREATININE 0.9 02/15/2017 1054      Component Value Date/Time   CALCIUM 9.6 08/11/2017 1517   CALCIUM 9.6 02/15/2017 1054   ALKPHOS 82 08/11/2017 1517   ALKPHOS 62 02/15/2017 1054   AST 27 08/11/2017 1517   AST 25 02/15/2017 1054   ALT 18 08/11/2017 1517   ALT 15 02/15/2017 1054   BILITOT 0.4 08/11/2017 1517   BILITOT 0.60 02/15/2017 1054       No results found for: LABCA2  No components found for: LABCA125  No results for input(s): INR in  the last 168 hours.  Urinalysis No results found for: COLORURINE, APPEARANCEUR, LABSPEC, PHURINE, GLUCOSEU, HGBUR, BILIRUBINUR, KETONESUR, PROTEINUR, UROBILINOGEN, NITRITE, LEUKOCYTESUR   STUDIES: Mr Lumbar Spine Wo Contrast  Result Date: 07/18/2017 CLINICAL DATA:  Initial evaluation for intermittent low back pain for 2 years. History of breast cancer. EXAM: MRI LUMBAR SPINE WITHOUT CONTRAST TECHNIQUE:  Multiplanar, multisequence MR imaging of the lumbar spine was performed. No intravenous contrast was administered. COMPARISON:  Prior radiograph from 07/05/2017. FINDINGS: Segmentation: Normal segmentation. Lowest well-formed disc labeled the L5-S1 level. Alignment: Vertebral bodies normally aligned with preservation of the normal lumbar lordosis. No listhesis. Vertebrae: Vertebral body heights well maintained without evidence for acute or chronic fracture. Bone marrow signal intensity within normal limits. No discrete or worrisome osseous lesions. No abnormal marrow edema. Conus medullaris and cauda equina: Conus extends to the L1 level. Conus and cauda equina appear normal. Paraspinal and other soft tissues: Paraspinous soft tissues within normal limits. Visualized visceral structures normal. Disc levels: No significant findings are seen through the L3-4 level. L4-5: Mild annular disc bulge. Mild bilateral facet hypertrophy. No significant canal or foraminal stenosis. L5-S1: Unremarkable. IMPRESSION: 1. Mild annular disc bulge with facet hypertrophy at L4-5 without stenosis or neural impingement. 2. Otherwise unremarkable MRI of the lumbar spine. Electronically Signed   By: Jeannine Boga M.D.   On: 07/18/2017 23:44   US Pelvis Transvanginal Non-ob (tv Only)  Result Date: 07/14/2017 CLINICAL DATA:  RIGHT breast cancer, question uterine leiomyomata, prior myomectomy and ovarian cyst removal EXAM: TRANSABDOMINAL AND TRANSVAGINAL ULTRASOUND OF PELVIS TECHNIQUE: Both transabdominal and transvaginal  ultrasound examinations of the pelvis were performed. Transabdominal technique was performed for global imaging of the pelvis including uterus, ovaries, adnexal regions, and pelvic cul-de-sac. It was necessary to proceed with endovaginal exam following the transabdominal exam to visualize the endometrium. COMPARISON:  05/05/2004 FINDINGS: Uterus Measurements: 7.5 x 7.8 x 8.9 cm. Diffusely heterogeneous myometrium. Multiple uterine masses identified likely representing uterine leiomyomata. Exophytic lesion LEFT fundus laterally 5.4 x 3.3 x 6.9 cm. Mid RIGHT leiomyoma 3.0 x 3.1 x 2.8 cm, submucosal. LEFT fundal leiomyoma 5.6 x 2.8 x 4.9 cm. Endometrium Thickness: 7 mm.  No endometrial fluid.  Distorted by leiomyomata Right ovary Measurements: 5.3 x 2.6 x 1.8 cm. Simple appearing cyst 2.8 x 2.7 x 2.2 cm. Otherwise normal morphology. Left ovary Measurements: 2.9 x 1.9 x 1.4 cm.  Normal morphology without mass Other findings Trace free pelvic fluid.  No adnexal masses otherwise seen. IMPRESSION: Multiple uterine leiomyomata largest 5.6 cm diameter. Small simple cyst RIGHT ovary 2.8 cm diameter. Electronically Signed   By: Lavonia Dana M.D.   On: 07/14/2017 12:58   US Pelvis (transabdominal Only)  Result Date: 07/14/2017 CLINICAL DATA:  RIGHT breast cancer, question uterine leiomyomata, prior myomectomy and ovarian cyst removal EXAM: TRANSABDOMINAL AND TRANSVAGINAL ULTRASOUND OF PELVIS TECHNIQUE: Both transabdominal and transvaginal ultrasound examinations of the pelvis were performed. Transabdominal technique was performed for global imaging of the pelvis including uterus, ovaries, adnexal regions, and pelvic cul-de-sac. It was necessary to proceed with endovaginal exam following the transabdominal exam to visualize the endometrium. COMPARISON:  05/05/2004 FINDINGS: Uterus Measurements: 7.5 x 7.8 x 8.9 cm. Diffusely heterogeneous myometrium. Multiple uterine masses identified likely representing uterine leiomyomata.  Exophytic lesion LEFT fundus laterally 5.4 x 3.3 x 6.9 cm. Mid RIGHT leiomyoma 3.0 x 3.1 x 2.8 cm, submucosal. LEFT fundal leiomyoma 5.6 x 2.8 x 4.9 cm. Endometrium Thickness: 7 mm.  No endometrial fluid.  Distorted by leiomyomata Right ovary Measurements: 5.3 x 2.6 x 1.8 cm. Simple appearing cyst 2.8 x 2.7 x 2.2 cm. Otherwise normal morphology. Left ovary Measurements: 2.9 x 1.9 x 1.4 cm.  Normal morphology without mass Other findings Trace free pelvic fluid.  No adnexal masses otherwise seen. IMPRESSION: Multiple uterine leiomyomata largest 5.6 cm diameter. Small simple cyst RIGHT  ovary 2.8 cm diameter. Electronically Signed   By: Lavonia Dana M.D.   On: 07/14/2017 12:58     ELIGIBLE FOR AVAILABLE RESEARCH PROTOCOL: no  ASSESSMENT: 42 y.o. Candice [name] woman currently residing in [place], status post right breast upper inner quadrant biopsy 04/21/2016 for a clinical T2 N0, stage IIa invasive ductal carcinoma, grade 2, estrogen and progesterone receptor positive, HER-2 nonamplified, with an MIB-1 of 20%  (a) biopsy of 2 additional suspicious areas in the right breast 05/12/2016 was benign  (1) started tamoxifen 04/27/2016, Discontinued at the start of chemotherapy  (2) Oncotype DX obtained from the original biopsy showed a score of 14, predicting a 10 year risk of recurrence outside the breast of 9% if the patient's only systemic therapy is tamoxifen for 5 years. It also predicts no benefit from chemotherapy.   (3) patient is not interested in fertility preservation  (4) genetics testing 04/27/2016 through the [facility] gene panel offered by [facility] found no deleterious mutations in APC, ATM, BARD1, BMPR1A, BRCA1, BRCA2, BRIP1, CHD1, CDK4, CDKN2A, CHEK2, EPCAM (large rearrangement only), MLH1, MSH2, MSH6, MUTYH, NBN, PALB2, PMS2, PTEN, RAD51C, RAD51D, SMAD4, STK11, and TP53. Sequencing was performed for select regions of POLE and POLD1, and large rearrangement  analysis was performed for select regions of GREM1.   (5) status post right lumpectomy with sentinel lymph node sampling 06/07/2016 for a pT1c pN1, stage IB invasive ductal carcinoma, grade 2, with negative margins   (6) Mammaprint sent from the final surgical sample was read as high risk, indicating a need for chemotherapy   (7) doxorubicin and cyclophosphamide in dose dense fashion 4  started 07/12/2016, completed 08/23/2016 followed by paclitaxel weekly 12 started 09/06/2016 (received 2 cycles), changed to Abraxane on 09/27/2016 due to reaction to Paclitaxel: Abraxane discontinued after 1 cycle because of the same reaction.  (8) adjuvant radiation 11/01/16-12/19/16 Site/dose:   1. Right Breast: 50 Gy in 25 fractions                          2. Right Breast Boost: 10 Gy in 5 fractions                          3. Right Breast SCV: 50 Gy in 25 fractions    (9) resumed tamoxifen 12/26/2016, discontinued January 2019 in preparation for anastrozole  (10) goserelin started 05/01/2017  (11) anastrozole started 06/19/2017  PLAN: Saragrace is now a little over a year out from definitive surgery for breast cancer with no evidence of disease recurrence.  This is very favorable.  She is tolerating the anastrozole and goserelin remarkably well and the plan is to continue those for 5 years.  Of course if she has bilateral salpingo-oophorectomy at some point she will be able to get off the goserelin but she will need to be out of work for several weeks and she does not have that much vacation or sick time built up yet.  Likely it will be a year or so before she has definitive treatment.  As far as her weight is concerned since she has a very good diet and is very active and exercise the only thing for her to do is to cut back on portions  We went over her MRI of the spine and her ultrasound of the uterus and she will be discussing that further with Dr. Gerarda Fraction.  Mckenley knows to call for any other  issues  that may develop before the next visit.   Magrinat, Virgie Dad, MD  08/11/17 4:17 PM Medical Oncology and Hematology [facility] [address] [place], [place] [address] Tel. [phone]    Fax. [phone]  This document serves as a record of services personally performed by Lurline Del, MD. It was created on his behalf by Sheron Nightingale, a trained medical scribe. The creation of this record is based on the scribe's personal observations and the provider's statements to them.   I have reviewed the above documentation for accuracy and completeness, and I agree with the above.

## 2017-08-11 ENCOUNTER — Inpatient Hospital Stay (HOSPITAL_BASED_OUTPATIENT_CLINIC_OR_DEPARTMENT_OTHER): Payer: Medicaid Other | Admitting: Oncology

## 2017-08-11 ENCOUNTER — Telehealth: Payer: Self-pay | Admitting: Oncology

## 2017-08-11 ENCOUNTER — Inpatient Hospital Stay: Payer: Medicaid Other

## 2017-08-11 VITALS — BP 131/88 | HR 67 | Temp 98.4°F | Resp 18 | Ht 64.0 in | Wt 162.8 lb

## 2017-08-11 DIAGNOSIS — N951 Menopausal and female climacteric states: Secondary | ICD-10-CM | POA: Diagnosis not present

## 2017-08-11 DIAGNOSIS — Z170 Estrogen receptor positive status [ER+]: Secondary | ICD-10-CM

## 2017-08-11 DIAGNOSIS — R635 Abnormal weight gain: Secondary | ICD-10-CM

## 2017-08-11 DIAGNOSIS — Z5111 Encounter for antineoplastic chemotherapy: Secondary | ICD-10-CM | POA: Diagnosis not present

## 2017-08-11 DIAGNOSIS — G4700 Insomnia, unspecified: Secondary | ICD-10-CM

## 2017-08-11 DIAGNOSIS — C50211 Malignant neoplasm of upper-inner quadrant of right female breast: Secondary | ICD-10-CM

## 2017-08-11 DIAGNOSIS — N898 Other specified noninflammatory disorders of vagina: Secondary | ICD-10-CM

## 2017-08-11 LAB — COMPREHENSIVE METABOLIC PANEL
ALT: 18 U/L (ref 0–55)
AST: 27 U/L (ref 5–34)
Albumin: 4.4 g/dL (ref 3.5–5.0)
Alkaline Phosphatase: 82 U/L (ref 40–150)
Anion gap: 7 (ref 3–11)
BUN: 10 mg/dL (ref 7–26)
CO2: 28 mmol/L (ref 22–29)
Calcium: 9.6 mg/dL (ref 8.4–10.4)
Chloride: 105 mmol/L (ref 98–109)
Creatinine, Ser: 0.83 mg/dL (ref 0.60–1.10)
GFR calc Af Amer: >60 mL/min (ref >60)
GFR calc non Af Amer: >60 mL/min (ref >60)
Glucose, Bld: 84 mg/dL (ref 70–140)
Potassium: 4 mmol/L (ref 3.5–5.1)
Sodium: 140 mmol/L (ref 136–145)
Total Bilirubin: 0.4 mg/dL (ref 0.2–1.2)
Total Protein: 7.2 g/dL (ref 6.4–8.3)

## 2017-08-11 LAB — CBC WITH DIFFERENTIAL/PLATELET
Basophils Absolute: 0 10*3/uL (ref 0.0–0.1)
Basophils Relative: 1 %
Eosinophils Absolute: 0.1 10*3/uL (ref 0.0–0.5)
Eosinophils Relative: 3 %
HCT: 40.6 % (ref 34.8–46.6)
Hemoglobin: 13.7 g/dL (ref 11.6–15.9)
Lymphocytes Relative: 47 %
Lymphs Abs: 1.8 10*3/uL (ref 0.9–3.3)
MCH: 31.9 pg (ref 25.1–34.0)
MCHC: 33.8 g/dL (ref 31.5–36.0)
MCV: 94.4 fL (ref 79.5–101.0)
Monocytes Absolute: 0.3 10*3/uL (ref 0.1–0.9)
Monocytes Relative: 8 %
Neutro Abs: 1.5 10*3/uL (ref 1.5–6.5)
Neutrophils Relative %: 41 %
Platelets: 193 10*3/uL (ref 145–400)
RBC: 4.3 MIL/uL (ref 3.70–5.45)
RDW: 13.2 % (ref 11.2–14.5)
WBC: 3.7 10*3/uL — ABNORMAL LOW (ref 3.9–10.3)

## 2017-08-11 NOTE — Telephone Encounter (Signed)
Referral to PT sent via [facility]. No other requests/orders per 5/17 los.

## 2017-08-15 ENCOUNTER — Ambulatory Visit: Payer: Medicaid Other | Admitting: Physical Therapy

## 2017-08-15 DIAGNOSIS — M545 Low back pain, unspecified: Secondary | ICD-10-CM

## 2017-08-15 DIAGNOSIS — M6281 Muscle weakness (generalized): Secondary | ICD-10-CM

## 2017-08-15 DIAGNOSIS — G8929 Other chronic pain: Secondary | ICD-10-CM

## 2017-08-15 NOTE — Therapy (Signed)
[facility] [address] [place], [place], [address] Phone: [phone]   Fax:  [phone]  Physical Therapy Treatment  Patient Details  Name: Candice [name] MRN: [id number] Date of Birth: 1975/11/15 Referring Provider: Dr. Jonathon Jordan   Encounter Date: 08/15/2017  PT End of Session - 08/15/17 1006    Visit Number  3    Number of Visits  4    Date for PT Re-Evaluation  09/19/17    Authorization Type  Medicaid authorized 3 visits(not included eval) 5/1-5/28/19    PT Start Time  0930    PT Stop Time  1010    PT Time Calculation (min)  40 min    Activity Tolerance  Patient tolerated treatment well       Past Medical History:  Diagnosis Date  . Breast cancer ([place]) 05/2016   right  . Chronic constipation   . Family history of cancer   . IBS (irritable bowel syndrome)    IBS - C (constipation)  . Personal history of chemotherapy   . Personal history of radiation therapy     Past Surgical History:  Procedure Laterality Date  . BARTHOLIN CYST MARSUPIALIZATION  09/04/2002  . BARTHOLIN GLAND CYST EXCISION Left 07/13/2005  . BREAST LUMPECTOMY WITH RADIOACTIVE SEED AND SENTINEL LYMPH NODE BIOPSY Right 06/07/2016   Procedure: BREAST LUMPECTOMY WITH RADIOACTIVE SEED AND SENTINEL LYMPH NODE BIOPSY;  Surgeon: Alphonsa Overall, MD;  Location: [facility];  Service: General;  Laterality: Right;  . HYSTEROSCOPY  2016  . MYOMECTOMY  07/20/2004   Laparotomy  . OVARIAN CYST REMOVAL Right 07/20/2004  . PORTACATH PLACEMENT N/A 07/05/2016   REMOVED Fall 2018 ; Procedure: INSERTION PORT-A-CATH WITH [place];  Surgeon: Alphonsa Overall, MD;  Location: [facility];  Service: General;  Laterality: N/A;    There were no vitals filed for this visit.  Subjective Assessment - 08/15/17 0934    Subjective  I'm good.  Hectic at work and home so I didn't do the exercises much.  She requests to review them again.  She  reports her back is mostly just tired at the end of the day.      Currently in Pain?  No/denies    Pain Score  0-No pain    Pain Location  Back    Pain Type  Chronic pain    Aggravating Factors   end of the day                       [facility] Adult PT Treatment/Exercise - 08/15/17 0001      Lumbar Exercises: Stretches   Double Knee to Chest Stretch  3 reps    Other Lumbar Stretch Exercise  childs pose stretch 4x with and without bias       Lumbar Exercises: Aerobic   Nustep  L2 7 min       Lumbar Exercises: Standing   Row  Strengthening;Both;15 reps;Theraband    Theraband Level (Row)  Level 3 (Green)    Shoulder Extension  Strengthening;Left;15 reps;Theraband    Theraband Level (Shoulder Extension)  Level 3 (Green)    Other Standing Lumbar Exercises  SLS with green band UE diagonals 20x each      Lumbar Exercises: Supine   Dead Bug  15 reps    Isometric Hip Flexion  10 reps      Lumbar Exercises: Prone   Other Prone Lumbar Exercises  lumbar multifidi series:  2 pillows pelvic press, HS curl, hip extension, bent knee hip extension 5x each plus UE lifts per HEP      Lumbar Exercises: Quadruped   Other Quadruped Lumbar Exercises  1/2 kneel chops 10x    Other Quadruped Lumbar Exercises  Tall kneel green band horizontal abduction 10x             PT Education - 08/15/17 1004    Education provided  Yes    Education Details  prone multifidi plus;  rows and extensions green band;  1/2 kneeling     Person(s) Educated  Patient    Methods  Explanation;Demonstration;Handout    Comprehension  Returned demonstration;Verbalized understanding       PT Short Term Goals - 07/25/17 1951      PT SHORT TERM GOAL #1   Title  STGs=LTGs        PT Long Term Goals - 07/25/17 1951      PT LONG TERM GOAL #1   Title  The patient will be independent in an appropriate HEP to address deficits related to LBP    Baseline  Patient is currently not doing any ex and lacks  knowledge of appropriate HEP    Time  8    Period  Weeks    Status  New    Target Date  09/19/17      PT LONG TERM GOAL #2   Title  The patient will report a 50% improvement in back pain at night for better sleep    Time  8    Period  Weeks    Status  New      PT LONG TERM GOAL #3   Title  Lumbar/core strength at least 4+/5 needed for improved function with less pain    Time  8    Period  Weeks    Status  New      PT LONG TERM GOAL #4   Title  The patient will report a good understanding of postural correction to avoid excessive lumbar lordotic posture     Time  8    Period  Weeks    Status  New            Plan - 08/15/17 1006    Clinical Impression Statement  The patient needs minimal verbal cues to coordinate breathing with standing core strengthening exercises.  She needs tactile cues with quadruped core exercises to avoid excessive lumbar hyperlordosis.  Mild muscular discomfort produced with abdominal and lumbar multifidi exs but relieved with childs pose stretching.  She should meet remaining goals next visit for LBP.      Rehab Potential  Good    Clinical Impairments Affecting Rehab Potential  no U/S     PT Frequency  1x / week    PT Duration  8 weeks    PT Treatment/Interventions  ADLs/Self Care Home Management;Cryotherapy;Electrical Stimulation;Therapeutic activities;Therapeutic exercise;Neuromuscular re-education;Patient/family education;Dry needling;Taping    PT Next Visit Plan  lumbo/pelvic core strengthening;  review HEP for possible discharge next visit.     Recommended Other Services  has a new referral from different doctor for pelvic floor PT       Patient will benefit from skilled therapeutic intervention in order to improve the following deficits and impairments:  Pain, Decreased strength, Postural dysfunction, Hypermobility  Visit Diagnosis: Chronic bilateral low back pain without sciatica  Muscle weakness (generalized)     Problem  List Patient Active Problem List   Diagnosis Date Noted  .  Port catheter in place 09/06/2016  . Genetic testing 05/09/2016  . Malignant neoplasm of upper-inner quadrant of right breast in female, estrogen receptor positive ([place]) 04/27/2016  . Family history of breast cancer   . Family history of ovarian cancer   . Family history of prostate cancer   . Family history of pancreatic cancer    Ruben Im, PT 08/15/17 10:24 AM Phone: [phone] Fax: [phone]  Alvera Singh 08/15/2017, 10:24 AM  [facility] [address] [place], [place], [address] Phone: [phone]   Fax:  [phone]  Name: Candice [name] MRN: [id number] Date of Birth: 18-Feb-1976

## 2017-08-15 NOTE — Patient Instructions (Addendum)
Candice [name] PT [facility] [address], [address] [place], [place] [address] Phone # [phone] Fax [phone]    

## 2017-08-17 ENCOUNTER — Encounter: Payer: Self-pay | Admitting: Obstetrics

## 2017-08-17 ENCOUNTER — Inpatient Hospital Stay (HOSPITAL_BASED_OUTPATIENT_CLINIC_OR_DEPARTMENT_OTHER): Payer: Medicaid Other | Admitting: Obstetrics

## 2017-08-17 VITALS — BP 117/74 | HR 64 | Temp 98.5°F | Resp 20 | Ht 64.0 in | Wt 162.2 lb

## 2017-08-17 DIAGNOSIS — D250 Submucous leiomyoma of uterus: Secondary | ICD-10-CM | POA: Diagnosis not present

## 2017-08-17 DIAGNOSIS — Z170 Estrogen receptor positive status [ER+]: Secondary | ICD-10-CM

## 2017-08-17 DIAGNOSIS — D251 Intramural leiomyoma of uterus: Secondary | ICD-10-CM

## 2017-08-17 DIAGNOSIS — Z5111 Encounter for antineoplastic chemotherapy: Secondary | ICD-10-CM | POA: Diagnosis not present

## 2017-08-17 NOTE — Patient Instructions (Signed)
Plan to have an MRI on August 31, 2017 at [facility].  Arrive at 12:30pm for a 1pm appointment.  We will plan to see you in the office in early July after you meet with Dr. Jana Hakim at the end of June.  Please call for any questions or concerns in between that time.

## 2017-08-21 ENCOUNTER — Encounter: Payer: Self-pay | Admitting: Obstetrics

## 2017-08-21 DIAGNOSIS — D259 Leiomyoma of uterus, unspecified: Secondary | ICD-10-CM | POA: Insufficient documentation

## 2017-08-21 NOTE — Progress Notes (Signed)
Consult Note: Gyn-Onc  Consult was requested by Dr. Jana Hakim for the evaluation of Candice [name] 42 y.o. female  CC:  Chief Complaint  Patient presents with  . Estrogen receptor positive    HPI: Candice [name]  is a very nice 42 y.o.  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  This was 95% estrogen receptor positive; 100% progesterone receptor positive.  Treatment for her breast cancer including surgery chemotherapy and radiation.  She states she completed all treatment September 2018.  She was on tamoxifen for some time which was then switched to anastrozole she states after she had a menstrual cycle in January 2019.  Her ovaries are suppressed currently by monthly goserelin.  She presented for discussion of the possibility of BSO for permanent surgical suppression.  She had some questions on her initial visit regarding breast cancer recurrence risks when comparing BSO to her current regimen.  She admits to a past history of a fibroid uterus and myomectomy.  She has had [facility] My Risk genetics panel 04/27/16 and that found no deleterious mutations.  Interval history:  Given her history of fibroids and based on clinical exam I ordered a transvaginal ultrasound.  She returns to review that today.  In summary the uterus was seen to be heterogeneous with multiple leiomyoma.  There is a simple appearing 2.8 cm right ovarian cyst left adnexa was normal  Measurement of disease:  No results for input(s): CA125, CAN125, CEA, CA199, ESTRADIOL, INHBB in the last 8760 hours.  Invalid input(s): INHIBINA  . Defer to Medical Oncology   Radiology (relevant to GYN visit)  04/2004  ULTRASOUND OF THE PELVIS:  Transabdominal and transvaginal ultrasound of the pelvis were performed. The uterus is prominent measuring 12.2 cm sagittally with a depth of 10.5 cm and width of 8.6 cm. The uterus is retroflexed. The endometrium measures 8.8 mm in  thickness. However there is a large fibroid emanating from the posterior aspect of the upper midbody of the uterus measuring 8.2 x 8.5 x 9.1 cm. Complex cyst is noted emanating from the right ovary measuring 4.2 x 2.9 x 3.4 cm. With the diffuse internal echogenicity of this cyst this may represent hemorrhagic cyst or possibly endometrioma in the proper clinical setting. Followup ultrasound in two months is recommended to assess this area further. The right ovary measures 5.1 x 3.8 x 4.2 cm. The left ovary measures 3.9 x 1.8 x 2.9 cm with small subcentimeter follicular cysts present. Only a small amount of free fluid is noted.   IMPRESSION:  1. Large fibroid emanates from the posterior upper body of the uterus measuring 8.2 x 9.5 x 9.1 cm.   2. Smaller fibroid near the fundus of 2.4 x 1.8 x 3.1 cm.   3. Complex cyst in the right ovary of 4.2 x 2.9 x 3.4 cm. See above.  07/14/2017-transabdominal transvaginal ultrasound the pelvis-compared to 2006-impression is multiple uterine leiomyoma largest 5.6 cm.  Uterus measures 7.5 x 7.8 x 8.9 cm there is an exophytic left fundal lesion 5.4 x 3.3 x 6.9 cm.  A mid right leiomyoma 3 x 3.1 x 2.8 cm.  A left fundal leiomyoma 5.6 x 2.8 x 4.9 cm.  Also noted is a right ovary 5.3 x 2.6 x 1.8 with a simple appearing cyst 2.8 x 2.7 x 2.2 cm.    Oncologic History:      Malignant neoplasm of upper-inner quadrant of right breast in female,  estrogen receptor positive ([place])   04/21/2016 Initial Biopsy    Right breast upper inner quadrant biopsy: IDC, grade 2, ER/PR positive, Her-2 negative, Ki-67 20%.      04/27/2016 Initial Diagnosis    Malignant neoplasm of upper-inner quadrant of right breast in female, estrogen receptor positive ([place])      04/27/2016 -  Anti-estrogen oral therapy    Tamoxifen started, then stopped at start of chemotherapy and resumed in 12/26/2016, Goserelin started on 05/01/2017, to change to Anastrozole after 2-3 doses of Goserelin      05/09/2016  Genetic Testing    Patient has genetic testing done for personal and family history of breast cancer. Negative genetic testing on the [facility] panel.  The [facility] gene panel offered by [employer] includes sequencing and deletion/duplication testing of the following 28 genes: APC, ATM, BARD1, BMPR1A, BRCA1, BRCA2, BRIP1, CHD1, CDK4, CDKN2A, CHEK2, EPCAM (large rearrangement only), MLH1, MSH2, MSH6, MUTYH, NBN, PALB2, PMS2, PTEN, RAD51C, RAD51D, SMAD4, STK11, and TP53. Sequencing was performed for select regions of POLE and POLD1, and large rearrangement analysis was performed for select regions of GREM1.       06/07/2016 Surgery    Right lumpectomy and SLNB: IDC, grade 2, 1.8 cm, margins neg, 3 SLN biopsied, 1 with metastatic disease, 1 with micrometastatic disease      06/07/2016 Miscellaneous    Mammaprint high risk, indicated need for chemo      07/12/2016 - 09/27/2016 Adjuvant Chemotherapy    Doxorubicin and cyclophosphamide x 4 followed by 2 of weekly Paclitaxel stopped due to reaction, followed by 1 of Abraxane stopped for the same reason.      11/01/2016 - 12/19/2016 Radiation Therapy     1. Right Breast, axilla, SCV: 50 Gy in 25 fractions                          2. Right Breast Boost: 10 Gy in 5 fractions          Current Meds:  Outpatient Encounter Medications as of 08/17/2017  Medication Sig  . anastrozole (ARIMIDEX) 1 MG tablet Take 1 tablet (1 mg total) by mouth daily.  Marland Kitchen gabapentin (NEURONTIN) 300 MG capsule Take 1 capsule (300 mg total) by mouth at bedtime.  . [DISCONTINUED] lidocaine-prilocaine (EMLA) cream Apply 1 application topically as needed. (Patient not taking: Reported on 08/17/2017)   No facility-administered encounter medications on file as of 08/17/2017.     Allergy:  Allergies  Allergen Reactions  . Paclitaxel Itching  . Amoxicillin Rash    Social Hx:   Social History   Socioeconomic History  . Marital status: Single    Spouse  name: Not on file  . Number of children: Not on file  . Years of education: Not on file  . Highest education level: Not on file  Occupational History  . Not on file  Social Needs  . Financial resource strain: Not on file  . Food insecurity:    Worry: Not on file    Inability: Not on file  . Transportation needs:    Medical: Not on file    Non-medical: Not on file  Tobacco Use  . Smoking status: Never Smoker  . Smokeless tobacco: Never Used  Substance and Sexual Activity  . Alcohol use: Yes    Alcohol/week: 0.6 oz    Types: 1 Glasses of wine per week    Comment: once per month  . Drug use: No  .  Sexual activity: Not Currently    Partners: Male    Birth control/protection: None  Lifestyle  . Physical activity:    Days per week: Not on file    Minutes per session: Not on file  . Stress: Not on file  Relationships  . Social connections:    Talks on phone: Not on file    Gets together: Not on file    Attends religious service: Not on file    Active member of club or organization: Not on file    Attends meetings of clubs or organizations: Not on file    Relationship status: Not on file  . Intimate partner violence:    Fear of current or ex partner: Not on file    Emotionally abused: Not on file    Physically abused: Not on file    Forced sexual activity: Not on file  Other Topics Concern  . Not on file  Social History Narrative  . Not on file    Past Surgical Hx:  Past Surgical History:  Procedure Laterality Date  . BARTHOLIN CYST MARSUPIALIZATION  09/04/2002  . BARTHOLIN GLAND CYST EXCISION Left 07/13/2005  . BREAST LUMPECTOMY WITH RADIOACTIVE SEED AND SENTINEL LYMPH NODE BIOPSY Right 06/07/2016   Procedure: BREAST LUMPECTOMY WITH RADIOACTIVE SEED AND SENTINEL LYMPH NODE BIOPSY;  Surgeon: Alphonsa Overall, MD;  Location: [facility];  Service: General;  Laterality: Right;  . HYSTEROSCOPY  2016  . MYOMECTOMY  07/20/2004   Laparotomy  . OVARIAN CYST  REMOVAL Right 07/20/2004  . PORTACATH PLACEMENT N/A 07/05/2016   REMOVED Fall 2018 ; Procedure: INSERTION PORT-A-CATH WITH [place];  Surgeon: Alphonsa Overall, MD;  Location: [name];  Service: General;  Laterality: N/A;    Past Medical Hx:  Past Medical History:  Diagnosis Date  . Breast cancer ([place]) 05/2016   right  . Chronic constipation   . Family history of cancer   . IBS (irritable bowel syndrome)    IBS - C (constipation)  . Personal history of chemotherapy   . Personal history of radiation therapy     Past Gynecological History:   GYNECOLOGIC HISTORY:  No LMP recorded. (Menstrual status: Chemotherapy). Menarche: 42 years old P 0 LMP January 2019 Contraceptive oral contraceptives greater than 10 years not currently used HRT relative only to her breast cancer treatment  Last Pap now on record from December 2017- for intraepithelial lesion no HPV testing was performed Family Hx:  Family History  Problem Relation Age of Onset  . Breast cancer Mother 58  . Pancreatic cancer Paternal Grandmother   . Prostate cancer Paternal Grandfather   . Ovarian cancer Other        MGMs sister  . Breast cancer Other        mother's maternal first cousin    Review of Systems: General - weight gain  GI - bloating change in bowel habits and constipation Gyn - vaginal itching/dryness Endocrine - hot flashes Review of Systems  Musculoskeletal: Positive for back pain.  Skin: Positive for itching.  All other systems reviewed and are negative.  Vitals:  Blood pressure 117/74, pulse 64, temperature 98.5 F (36.9 C), temperature source Oral, resp. rate 20, height '5\' 4"'  (1.626 m), weight 162 lb 3.2 oz (73.6 kg), SpO2 100 %. Body mass index is 27.84 kg/m.   Physical Exam:  ECOG PERFORMANCE STATUS: 1 - Symptomatic but completely ambulatory   General :  Well developed, 42 y.o., female in no apparent distress HEENT:  Normocephalic/atraumatic, symmetric, EOMI, eyelids  normal Neck:   No visible masses.  Respiratory:  Respirations unlabored, no use of accessory muscles CV:   Deferred Breast:  Deferred Musculoskeletal: Normal muscle strength. Abdomen:  No visible masses or protrusion Extremities:  No visible edema or deformities Skin:   Normal inspection Neuro/Psych:  No focal motor deficit, no abnormal mental status. Normal gait. Normal affect. Alert and oriented to person, place, and time   Genito Urinary: From 07/10/2017 visit Vulva: Normal external female genitalia. Slight defect ~3:00 introitus due to Bartholins surgery. Bladder/urethra: Urethral meatus normal in size and location. No lesions or   masses, well supported bladder Speculum exam: Vagina: +thick white discharge No lesion, no bleeding. Cervix: Normal appearing, no lesions. Bimanual exam:  Uterus: difficult to determine given probable fibroids, mobile.  Adnexa: +mass in culdesac, posterior to uterus and low.  Rectovaginal:  Good tone, + impingement from mass that feels to be attached to uterus. No cul de sac nodularity, no parametrial involvement or nodularity.   Oncologic Summary: 1. Right breast cancer, hormone receptor positive a. S/p surgery, chemo, radiation b. Currently on ovarian suppression/anastrozole  Assessment/Plan: 1. Fibroid uterus o We reviewed her ultrasound images and report. o I went into significant detail discussing the anatomical findings and helping her to understand the details of the report. o We reviewed the very low possibility of sarcomas however the patient is not having symptoms that make me concerned at this point o I suggested we go ahead and get an MRI to delineate the fibroids a little further and also to help [place] with the surgical planning should she desire hysterectomy down the road. o I suspect given the size of the fibroids this may be contributing to her intermittent constipation and feeling of "bloating" 2. Recurrence risk for breast cancer o She  has yet to address her remaining questions about how BSO will benefit her breast cancer risk in the future.   o I encouraged her to follow back with medical oncology to answer these questions 3. Return to clinic in July after her MRI  o At that time we can determine whether she wishes to proceed with BSO and will discuss surgical risk at that time o Hopefully she will have made a decision about the BSO and have discussed with Dr. Jana Hakim the benefits 4. Procedure discussion o If we did proceed to BSO alone we would likely be able to perform this laparoscopically  i. I did previously discuss that she would be in permanent menopause after surgery and that we would not prescribe hormonal therapy following this o If she wanted hysterectomy I would approach this abdominally and would defer to another provider if they would attempt any other modality.    Isabel Caprice, MD  08/21/2017, 3:20 PM  Cc: Raiford Simmonds, MD Lurline Del, MD

## 2017-08-22 ENCOUNTER — Ambulatory Visit: Payer: Medicaid Other

## 2017-08-22 ENCOUNTER — Inpatient Hospital Stay: Payer: Medicaid Other

## 2017-08-22 ENCOUNTER — Encounter: Payer: Self-pay | Admitting: Physical Therapy

## 2017-08-22 ENCOUNTER — Ambulatory Visit: Payer: Medicaid Other | Admitting: Physical Therapy

## 2017-08-22 DIAGNOSIS — M545 Low back pain, unspecified: Secondary | ICD-10-CM

## 2017-08-22 DIAGNOSIS — Z170 Estrogen receptor positive status [ER+]: Secondary | ICD-10-CM

## 2017-08-22 DIAGNOSIS — M6281 Muscle weakness (generalized): Secondary | ICD-10-CM

## 2017-08-22 DIAGNOSIS — Z5111 Encounter for antineoplastic chemotherapy: Secondary | ICD-10-CM | POA: Diagnosis not present

## 2017-08-22 DIAGNOSIS — C50211 Malignant neoplasm of upper-inner quadrant of right female breast: Secondary | ICD-10-CM

## 2017-08-22 DIAGNOSIS — G8929 Other chronic pain: Secondary | ICD-10-CM

## 2017-08-22 DIAGNOSIS — Z95828 Presence of other vascular implants and grafts: Secondary | ICD-10-CM

## 2017-08-22 MED ORDER — GOSERELIN ACETATE 3.6 MG ~~LOC~~ IMPL
3.6000 mg | DRUG_IMPLANT | SUBCUTANEOUS | Status: DC
  Administered 2017-08-22: 3.6 mg via SUBCUTANEOUS

## 2017-08-22 NOTE — Therapy (Signed)
[facility] [address], [place], [place], [address] Phone: [phone]   Fax:  [phone]  Physical Therapy Treatment/Discharge Summary  Patient Details  Name: Candice [name] MRN: [id number] Date of Birth: 1976-02-25 Referring Provider: Dr. Jonathon Jordan   Encounter Date: 08/22/2017  PT End of Session - 08/22/17 1348    Visit Number  4    Number of Visits  4    Date for PT Re-Evaluation  09/19/17    Authorization Type  Medicaid authorized 3 visits(not included eval) 5/1-5/28/19    PT Start Time  0930    PT Stop Time  1010    PT Time Calculation (min)  40 min    Activity Tolerance  Patient tolerated treatment well       Past Medical History:  Diagnosis Date  . Breast cancer ([place]) 05/2016   right  . Chronic constipation   . Family history of cancer   . IBS (irritable bowel syndrome)    IBS - C (constipation)  . Personal history of chemotherapy   . Personal history of radiation therapy     Past Surgical History:  Procedure Laterality Date  . BARTHOLIN CYST MARSUPIALIZATION  09/04/2002  . BARTHOLIN GLAND CYST EXCISION Left 07/13/2005  . BREAST LUMPECTOMY WITH RADIOACTIVE SEED AND SENTINEL LYMPH NODE BIOPSY Right 06/07/2016   Procedure: BREAST LUMPECTOMY WITH RADIOACTIVE SEED AND SENTINEL LYMPH NODE BIOPSY;  Surgeon: Alphonsa Overall, MD;  Location: [place];  Service: General;  Laterality: Right;  . HYSTEROSCOPY  2016  . MYOMECTOMY  07/20/2004   Laparotomy  . OVARIAN CYST REMOVAL Right 07/20/2004  . PORTACATH PLACEMENT N/A 07/05/2016   REMOVED Fall 2018 ; Procedure: INSERTION PORT-A-CATH WITH [place];  Surgeon: Alphonsa Overall, MD;  Location: [facility];  Service: General;  Laterality: N/A;    There were no vitals filed for this visit.  Subjective Assessment - 08/22/17 0930    Subjective  Feeling good.  I did the exercises several times.  The exercises do help.  The pain comes and  goes.  One of my cancer medicines causes insomnia, so it's that and not my back that keeps me awake.      Pertinent History  Diagnosed with right breast cancer 04/21/16.  Had lumpectomy with sentinel lymph node biopsy in March; nodes were positive.  Had adjuvant chemo May-July2018, then had radiation August-September 2018.  Is on tamoxifen and getting injections to stop her periods, once every six months. Noticed some swelling right after the surgery and recently has been feeling it; reports tenderness too, at right axilla and inferior to that. Otherwise healthy except for IBS x 2 years.    Currently in Pain?  No/denies    Pain Score  0-No pain    Pain Location  Back    Pain Type  Chronic pain         [facility] PT Assessment - 08/22/17 0001      AROM   Overall AROM Comments  full hip ROM    Lumbar Flexion  70 able to touch toes    Lumbar Extension  40    Lumbar - Right Side Bend  40    Lumbar - Left Side Bend  40      Strength   Lumbar Flexion  4+/5    Lumbar Extension  4+/5                   [facility] Adult PT Treatment/Exercise -  08/22/17 0001      Self-Care   Self-Care  Other Self-Care Comments    Other Self-Care Comments   sleeping positioning with pillow and/or towel roll      Therapeutic Activites    Therapeutic Activities  Lifting    Lifting  squat lift method and per patient request: dead lifts      Lumbar Exercises: Aerobic   Nustep  L2 10 min       Lumbar Exercises: Supine   Other Supine Lumbar Exercises  single leg lower 1/3 of the way with abdominal brace 5x each leg      Lumbar Exercises: Prone   Other Prone Lumbar Exercises  lumbar multifidi series:   2 pillows pelvic press, HS curl, hip extension, bent knee hip extension 5x each plus UE lifts per HEP      Lumbar Exercises: Quadruped   Opposite Arm/Leg Raise  Right arm/Left leg;Left arm/Right leg;5 reps               PT Short Term Goals - 07/25/17 1951      PT SHORT TERM GOAL #1   Title   STGs=LTGs        PT Long Term Goals - 08/22/17 0950      PT LONG TERM GOAL #1   Title  The patient will be independent in an appropriate HEP to address deficits related to LBP    Status  Achieved      PT LONG TERM GOAL #2   Title  The patient will report a 50% improvement in back pain at night for better sleep    Status  Achieved      PT LONG TERM GOAL #3   Title  Lumbar/core strength at least 4+/5 needed for improved function with less pain    Status  Achieved      PT LONG TERM GOAL #4   Title  The patient will report a good understanding of postural correction to avoid excessive lumbar lordotic posture     Status  Achieved            Plan - 08/22/17 1348    Clinical Impression Statement  The patient reports a good understanding of core strengthening, positioning to maintain neutral spine and basic body mechanics with lifting.   Good improvement in transverse abdominus muscle activation.  She has met all rehab goals and will be discharged from PT at this time to an independent HEP.         Patient will benefit from skilled therapeutic intervention in order to improve the following deficits and impairments:     Visit Diagnosis: Chronic bilateral low back pain without sciatica  Muscle weakness (generalized)  PHYSICAL THERAPY DISCHARGE SUMMARY  Visits from Start of Care: 4  Current functional level related to goals / functional outcomes: See clinical impression statement above   Remaining deficits: As above   Education / Equipment: HEP Plan: Patient agrees to discharge.  Patient goals were met. Patient is being discharged due to meeting the stated rehab goals.  ?????            Problem List Patient Active Problem List   Diagnosis Date Noted  . Intramural and submucous leiomyoma of uterus 08/21/2017  . Port catheter in place 09/06/2016  . Genetic testing 05/09/2016  . Malignant neoplasm of upper-inner quadrant of right breast in female, estrogen  receptor positive ([place]) 04/27/2016  . Family history of breast cancer   . Family history of ovarian  cancer   . Family history of prostate cancer   . Family history of pancreatic cancer    Ruben Im, PT 08/22/17 1:53 PM Phone: [phone] Fax: [phone]  Alvera Singh 08/22/2017, 1:52 PM  [facility] [address], [place], [place], [address] Phone: [phone]   Fax:  [phone]  Name: Candice [name] MRN: [id number] Date of Birth: 06-11-1975

## 2017-08-22 NOTE — Patient Instructions (Signed)
Goserelin injection What is this medicine? GOSERELIN (GOE se rel in) is similar to a hormone found in the body. It lowers the amount of sex hormones that the body makes. Men will have lower testosterone levels and women will have lower estrogen levels while taking this medicine. In men, this medicine is used to treat prostate cancer; the injection is either given once per month or once every 12 weeks. A once per month injection (only) is used to treat women with endometriosis, dysfunctional uterine bleeding, or advanced breast cancer. This medicine may be used for other purposes; ask your health care provider or pharmacist if you have questions. COMMON BRAND NAME(S): Zoladex What should I tell my health care provider before I take this medicine? They need to know if you have any of these conditions (some only apply to women): -diabetes -heart disease or previous heart attack -high blood pressure -high cholesterol -kidney disease -osteoporosis or low bone density -problems passing urine -spinal cord injury -stroke -tobacco smoker -an unusual or allergic reaction to goserelin, hormone therapy, other medicines, foods, dyes, or preservatives -pregnant or trying to get pregnant -breast-feeding How should I use this medicine? This medicine is for injection under the skin. It is given by a health care professional in a hospital or clinic setting. Men receive this injection once every 4 weeks or once every 12 weeks. Women will only receive the once every 4 weeks injection. Talk to your pediatrician regarding the use of this medicine in children. Special care may be needed. Overdosage: If you think you have taken too much of this medicine contact a poison control center or emergency room at once. NOTE: This medicine is only for you. Do not share this medicine with others. What if I miss a dose? It is important not to miss your dose. Call your doctor or health care professional if you are unable to  keep an appointment. What may interact with this medicine? -female hormones like estrogen -herbal or dietary supplements like black cohosh, chasteberry, or DHEA -female hormones like testosterone -prasterone This list may not describe all possible interactions. Give your health care provider a list of all the medicines, herbs, non-prescription drugs, or dietary supplements you use. Also tell them if you smoke, drink alcohol, or use illegal drugs. Some items may interact with your medicine. What should I watch for while using this medicine? Visit your doctor or health care professional for regular checks on your progress. Your symptoms may appear to get worse during the first weeks of this therapy. Tell your doctor or healthcare professional if your symptoms do not start to get better or if they get worse after this time. Your bones may get weaker if you take this medicine for a long time. If you smoke or frequently drink alcohol you may increase your risk of bone loss. A family history of osteoporosis, chronic use of drugs for seizures (convulsions), or corticosteroids can also increase your risk of bone loss. Talk to your doctor about how to keep your bones strong. This medicine should stop regular monthly menstration in women. Tell your doctor if you continue to menstrate. Women should not become pregnant while taking this medicine or for 12 weeks after stopping this medicine. Women should inform their doctor if they wish to become pregnant or think they might be pregnant. There is a potential for serious side effects to an unborn child. Talk to your health care professional or pharmacist for more information. Do not breast-feed an infant while taking   this medicine. Men should inform their doctors if they wish to father a child. This medicine may lower sperm counts. Talk to your health care professional or pharmacist for more information. What side effects may I notice from receiving this  medicine? Side effects that you should report to your doctor or health care professional as soon as possible: -allergic reactions like skin rash, itching or hives, swelling of the face, lips, or tongue -bone pain -breathing problems -changes in vision -chest pain -feeling faint or lightheaded, falls -fever, chills -pain, swelling, warmth in the leg -pain, tingling, numbness in the hands or feet -signs and symptoms of low blood pressure like dizziness; feeling faint or lightheaded, falls; unusually weak or tired -stomach pain -swelling of the ankles, feet, hands -trouble passing urine or change in the amount of urine -unusually high or low blood pressure -unusually weak or tired Side effects that usually do not require medical attention (report to your doctor or health care professional if they continue or are bothersome): -change in sex drive or performance -changes in breast size in both males and females -changes in emotions or moods -headache -hot flashes -irritation at site where injected -loss of appetite -skin problems like acne, dry skin -vaginal dryness This list may not describe all possible side effects. Call your doctor for medical advice about side effects. You may report side effects to FDA at 1-800-FDA-1088. Where should I keep my medicine? This drug is given in a hospital or clinic and will not be stored at home. NOTE: This sheet is a summary. It may not cover all possible information. If you have questions about this medicine, talk to your doctor, pharmacist, or health care provider.  2018 Elsevier/Gold Standard (2013-05-21 11:10:35)  

## 2017-08-23 ENCOUNTER — Ambulatory Visit: Payer: Medicaid Other

## 2017-08-31 ENCOUNTER — Ambulatory Visit (HOSPITAL_COMMUNITY): Payer: Medicaid Other

## 2017-09-07 ENCOUNTER — Ambulatory Visit (HOSPITAL_COMMUNITY)
Admission: RE | Admit: 2017-09-07 | Discharge: 2017-09-07 | Disposition: A | Discharge status: Home / Self Care | Payer: Medicaid Other | Source: Ambulatory Visit | Attending: Obstetrics | Admitting: Obstetrics

## 2017-09-07 ENCOUNTER — Ambulatory Visit: Payer: Medicaid Other | Admitting: Physical Therapy

## 2017-09-07 ENCOUNTER — Other Ambulatory Visit (HOSPITAL_COMMUNITY): Payer: Medicaid Other

## 2017-09-07 DIAGNOSIS — D259 Leiomyoma of uterus, unspecified: Secondary | ICD-10-CM | POA: Diagnosis not present

## 2017-09-07 DIAGNOSIS — D250 Submucous leiomyoma of uterus: Secondary | ICD-10-CM

## 2017-09-07 DIAGNOSIS — D251 Intramural leiomyoma of uterus: Secondary | ICD-10-CM | POA: Diagnosis present

## 2017-09-07 MED ORDER — GADOBENATE DIMEGLUMINE 529 MG/ML IV SOLN
15.0000 mL | Freq: Once | INTRAVENOUS | Status: AC | PRN
Start: 2017-09-07 — End: 2017-09-07
  Administered 2017-09-07: 15 mL via INTRAVENOUS

## 2017-09-08 ENCOUNTER — Encounter: Payer: Self-pay | Admitting: Physical Therapy

## 2017-09-08 ENCOUNTER — Ambulatory Visit: Payer: Medicaid Other | Attending: Family Medicine | Admitting: Physical Therapy

## 2017-09-08 ENCOUNTER — Other Ambulatory Visit: Payer: Self-pay

## 2017-09-08 DIAGNOSIS — M62838 Other muscle spasm: Secondary | ICD-10-CM

## 2017-09-08 DIAGNOSIS — Z170 Estrogen receptor positive status [ER+]: Secondary | ICD-10-CM | POA: Insufficient documentation

## 2017-09-08 DIAGNOSIS — C50211 Malignant neoplasm of upper-inner quadrant of right female breast: Secondary | ICD-10-CM | POA: Diagnosis present

## 2017-09-08 DIAGNOSIS — Z483 Aftercare following surgery for neoplasm: Secondary | ICD-10-CM

## 2017-09-08 NOTE — Patient Instructions (Signed)
Moisturizers . They are used in the vagina to hydrate the mucous membrane that make up the vaginal canal. . Designed to keep a more normal acid balance (ph) . Once placed in the vagina, it will last between two to three days.  . Use 2-3 times per week at bedtime and last longer than 60 min. . Ingredients to avoid is glycerin and fragrance, can increase chance of infection . Should not be used just before sex due to causing irritation . Most are gels administered either in a tampon-shaped applicator or as a vaginal suppository. They are non-hormonal.   Types of Moisturizers . Samul Dada- drug store . Vitamin E vaginal suppositories- Whole foods, Amazon . Moist Again . Coconut oil- can break down condoms . Michail Jewels . Yes moisturizer- amazon . NeuEve Silk , NeuEve Silver for menopausal or over 65 (if have severe vaginal atrophy or cancer treatments use NeuEve Silk for  1 month than move to The Pepsi)- [employer], MapleFlower.dk . Olive and Bee intimate cream- www.oliveandbee.com.au  Creams to use externally on the Vulva area  Albertson's (good for for cancer patients that had radiation to the area)- [place] or [employer] Corporation.FlyingBasics.com.br  V-magic cream - amazon  Julva-amazon  Vital "V Wild Yam salve ( help moisturize and help with thinning vulvar area, does have [place]   Things to avoid in the vaginal area . Do not use things to irritate the vulvar area . No lotions just specialized creams for the vulva area- Neogyn, V-magic, No soaps; can use Aveeno or Calendula cleanser if needed. Must be gentle . No deodorants . No douches . Good to sleep without underwear to let the vaginal area to air out . No scrubbing: spread the lips to let warm water rinse over labias and pat dry  Lubrication . Used for intercourse to reduce friction . Avoid ones that have glycerin, warming gels, tingling gels, icing or  cooling gel, scented . Avoid parabens due to a preservative similar to female sex hormone . May need to be reapplied once or several times during sexual activity . Can be applied to both partners genitals prior to vaginal penetration to minimize friction or irritation . Prevent irritation and mucosal tears that cause post coital pain and increased the risk of vaginal and urinary tract infections . Oil-based lubricants cannot be used with condoms due to breaking them down.  Least likely to irritate vaginal tissue.  . Plant based-lubes are safe . Silicone-based lubrication are thicker and last long and used for post-menopausal women  Vaginal Lubricators Here is a list of some suggested lubricators you can use for intercourse. Use the most hypoallergenic product.  You can place on you or your partner.   Slippery Stuff  Sylk or Sliquid Natural H2O ( good  if frequent UTI's)  Blossom Organics (www.blossom-organics.com)  Luvena   Coconut oil  PJur Woman Nude- water based lubricant, amazon  Uberlube- Amazon  Aloe Vera  Yes lubricant- Kern Soup Platinum-Silicone, Target, Walgreens  Olive and Bee intimate cream-  www.oliveandbee.com.au Things to avoid in lubricants are glycerin, warming gels, tingling gels, icing or cooling  gels, and scented gels.  Also avoid Vaseline. KY jelly, Replens, and Astroglide kills good bacteria(lactobacilli)  Things to avoid in the vaginal area . Do not use things to irritate the vulvar area . No lotions- see below . No soaps; can use Aveeno or Calendula cleanser if needed. Must be  gentle . No deodorants . No douches . Good to sleep without underwear to let the vaginal area to air out . No scrubbing: spread the lips to let warm water rinse over labias and pat dry  Creams that can be used on the [place] Releveum or Desert  Harvest Gele    Toileting Techniques for Bowel Movements (Defecation) Using your belly (abdomen) and pelvic floor muscles to have a bowel movement is usually instinctive.  Sometimes people can have problems with these muscles and have to relearn proper defecation (emptying) techniques.  If you have weakness in your muscles, organs that are falling out, decreased sensation in your pelvis, or ignore your urge to go, you may find yourself straining to have a bowel movement.  You are straining if you are: . holding your breath or taking in a huge gulp of air and holding it  . keeping your lips and jaw tensed and closed tightly . turning red in the face because of excessive pushing or forcing . developing or worsening your  hemorrhoids . getting faint while pushing . not emptying completely and have to defecate many times a day  If you are straining, you are actually making it harder for yourself to have a bowel movement.  Many people find they are pulling up with the pelvic floor muscles and closing off instead of opening the anus. Due to lack pelvic floor relaxation and coordination the abdominal muscles, one has to work harder to push the feces out.  Many people have never been taught how to defecate efficiently and effectively.  Notice what happens to your body when you are having a bowel movement.  While you are sitting on the toilet pay attention to the following areas: . Jaw and mouth position . Angle of your hips   . Whether your feet touch the ground or not . Arm placement  . Spine position . Waist . Belly tension . Anus (opening of the anal canal)  An Evacuation/Defecation Plan   Here are the 4 basic points:  1. Lean forward enough for your elbows to rest on your knees 2. Support your feet on the floor or use a low stool if your feet don't touch the floor  3. Push out your belly as if you have swallowed a beach ball-you should feel a widening of your waist 4. Open and relax your  pelvic floor muscles, rather than tightening around the anus      The following conditions my require modifications to your toileting posture:  . If you have had surgery in the past that limits your back, hip, pelvic, knee or ankle flexibility . Constipation   Your healthcare practitioner may make the following additional suggestions and adjustments:  1) Sit on the toilet  a) Make sure your feet are supported. b) Notice your hip angle and spine position-most people find it effective to lean forward or raise their knees, which can help the muscles around the anus to relax  c) When you lean forward, place your forearms on your thighs for support  2) Relax suggestions a) Breath deeply in through your nose and out slowly through your mouth as if you are smelling the flowers and blowing out the candles. b) To become aware of how to relax your muscles, contracting and releasing muscles can be helpful.  Pull your pelvic floor muscles  in tightly by using the image of holding back gas, or closing around the anus (visualize making a circle smaller) and lifting the anus up and in.  Then release the muscles and your anus should drop down and feel open. Repeat 5 times ending with the feeling of relaxation. c) Keep your pelvic floor muscles relaxed; let your belly bulge out. d) The digestive tract starts at the mouth and ends at the anal opening, so be sure to relax both ends of the tube.  Place your tongue on the roof of your mouth with your teeth separated.  This helps relax your mouth and will help to relax the anus at the same time.  3) Empty (defecation) a) Keep your pelvic floor and sphincter relaxed, then bulge your anal muscles.  Make the anal opening wide.  b) Stick your belly out as if you have swallowed a beach ball. c) Make your belly wall hard using your belly muscles while continuing to breathe. Doing this makes it easier to open your anus. d) Breath out and give a grunt (or try using  other sounds such as ahhhh, shhhhh, ohhhh or grrrrrrr).  4) Finish a) As you finish your bowel movement, pull the pelvic floor muscles up and in.  This will leave your anus in the proper place rather than remaining pushed out and down. If you leave your anus pushed out and down, it will start to feel as though that is normal and give you incorrect signals about needing to have a bowel movement.    Earlie Counts, PT [facility] [address] [place], [place] [address] About Abdominal Massage  Abdominal massage, also called external colon massage, is a self-treatment circular massage technique that can reduce and eliminate gas and ease constipation. The colon naturally contracts in waves in a clockwise direction starting from inside the right hip, moving up toward the ribs, across the belly, and down inside the left hip.  When you perform circular abdominal massage, you help stimulate your colon's normal wave pattern of movement called peristalsis.  It is most beneficial when done after eating.  Positioning You can practice abdominal massage with oil while lying down, or in the shower with soap.  Some people find that it is just as effective to do the massage through clothing while sitting or standing.  How to Massage Start by placing your finger tips or knuckles on your right side, just inside your hip bone.  . Make small circular movements while you move upward toward your rib cage.   . Once you reach the bottom right side of your rib cage, take your circular movements across to the left side of the bottom of your rib cage.  . Next, move downward until you reach the inside of your left hip bone.  This is the path your feces travel in your colon. . Continue to perform your abdominal massage in this pattern for 10 minutes each day.     You can apply as much pressure as is comfortable in your massage.  Start gently and build pressure as you continue to practice.   Notice any areas of pain as you massage; areas of slight pain may be relieved as you massage, but if you have areas of significant or intense pain, consult with your healthcare provider.  Other Considerations . General physical activity including bending and stretching can have a beneficial massage-like effect on the colon.  Deep breathing can also stimulate the colon because  breathing deeply activates the same nervous system that supplies the colon.   . Abdominal massage should always be used in combination with a bowel-conscious diet that is high in the proper type of fiber for you, fluids (primarily water), and a regular exercise program. [facility] [address] [place], [place] [address] Phone # [phone] Fax [phone]

## 2017-09-08 NOTE — Addendum Note (Signed)
Addended by: Earlie Counts F on: 09/08/2017 11:53 AM   Modules accepted: Orders

## 2017-09-08 NOTE — Therapy (Signed)
[facility] [address], [place], [place], [address] Phone: [phone]   Fax:  [phone]  Physical Therapy Evaluation/Discharge  Patient Details  Name: Candice [name] MRN: [id number] Date of Birth: 03-Jun-1975 Referring Provider: Dr. Tora Duck   Encounter Date: 09/08/2017  PT End of Session - 09/08/17 1136    Visit Number  1    PT Start Time  2446    PT Stop Time  1130    PT Time Calculation (min)  75 min    Activity Tolerance  Patient tolerated treatment well    Behavior During Therapy  [facility] for tasks assessed/performed       Past Medical History:  Diagnosis Date  . Breast cancer ([place]) 05/2016   right  . Chronic constipation   . Family history of cancer   . IBS (irritable bowel syndrome)    IBS - C (constipation)  . Personal history of chemotherapy   . Personal history of radiation therapy     Past Surgical History:  Procedure Laterality Date  . BARTHOLIN CYST MARSUPIALIZATION  09/04/2002  . BARTHOLIN GLAND CYST EXCISION Left 07/13/2005  . BREAST LUMPECTOMY WITH RADIOACTIVE SEED AND SENTINEL LYMPH NODE BIOPSY Right 06/07/2016   Procedure: BREAST LUMPECTOMY WITH RADIOACTIVE SEED AND SENTINEL LYMPH NODE BIOPSY;  Surgeon: Alphonsa Overall, MD;  Location: [place];  Service: General;  Laterality: Right;  . HYSTEROSCOPY  2016  . MYOMECTOMY  07/20/2004   Laparotomy  . OVARIAN CYST REMOVAL Right 07/20/2004  . PORTACATH PLACEMENT N/A 07/05/2016   REMOVED Fall 2018 ; Procedure: INSERTION PORT-A-CATH WITH [place];  Surgeon: Alphonsa Overall, MD;  Location: [place];  Service: General;  Laterality: N/A;    There were no vitals filed for this visit.   Subjective Assessment - 09/08/17 1028    Subjective  I had 2 surgeries, 5 months of chemotherapy. 6 weeks of radiation.  I am in remission. I have dryness vaginally. I have itching and burning.     Patient Stated Goals  vaginal health     Currently in Pain?  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 Sarajane Jews Magrinant    Onset Date/Surgical Date  06/19/17    Prior Therapy  none      Precautions   Precautions  Other (comment)    Precaution Comments  cancer precautions      Restrictions   Weight Bearing Restrictions  No      Balance Screen   Has the patient fallen in the past 6 months  No    Has the patient had a decrease in activity level because of a fear of falling?   No    Is the patient reluctant to leave their home because of a fear of falling?   No      Home Environment   Living Environment  Private residence    Type of [place]  Two level      Prior Function   Level of [place]  Part time employment    Vocation Requirements  professor in Estate agent    Leisure  goes to the  gym       Cognition   Overall Cognitive Status  Within Functional Limits for tasks assessed      ROM / Strength   AROM / PROM / Strength  AROM;PROM;Strength      AROM   Overall AROM   Within functional limits for tasks performed      Strength   Right Hip Flexion  5/5    Right Hip ABduction  5/5    Left Hip Flexion  5/5    Left Hip ABduction  5/5    Lumbar Flexion  5/5    Lumbar Extension  5/5      Transfers   Transfers  Independent with all Transfers      Ambulation/Gait   Ambulation/Gait  No                Objective measurements completed on examination: See above findings.    Pelvic Floor Special Questions - 09/08/17 0001    Currently Sexually Active  No when she had intercourse pain on left due to cyst removed    Urinary Leakage  No    Fecal incontinence  No    Skin Integrity  Intact;Other;Irritaion present at dryness    Skin  Integrity other  irritation by the cyst    Pelvic Floor Internal Exam  Patient confirms identification and approves PT to assess the pelvic floor and treatment    Exam Type  Vaginal    Sensation  used a mirror to educate patient on her vaginal anatomy    Palpation  tenderness located on the left sphincter muscles, cyst is protruding into the vulva area and posterior    Strength  strong squeeze, against strong resistance               PT Education - 09/08/17 1134    Education provided  Yes    Education Details  anatomy of the pelvic floor, vaginal moisturizers, lubricants, abdominal massage; toiletig technique    Person(s) Educated  Patient    Methods  Explanation;Demonstration;Verbal cues;Handout    Comprehension  Returned demonstration;Verbalized understanding       PT Short Term Goals - 07/25/17 1951      PT SHORT TERM GOAL #1   Title  STGs=LTGs        PT Long Term Goals - 09/08/17 1145      PT LONG TERM GOAL #1   Title  understand good vaginal health including vaginal moisturizers, lubricants, and washing care to reduce vaginal dryness    Time  1    Period  Days    Status  Achieved    Target Date  09/08/17      PT LONG TERM GOAL #2   Title  understand vaginal anatomy and what can cause vaginal dryness    Time  1    Period  Days    Status  Achieved    Target Date  09/08/17      PT LONG TERM GOAL #3   Title  understand correct toileting techniques to reduce strain on pelvic floor     Time  1    Period  Days    Status  Achieved    Target Date  09/08/17      PT LONG TERM GOAL #4   Title  ---             Plan - 09/08/17 1137    Clinical Impression Statement  Patient s/p breast cancer with treatment of chemotherapy and  radiation. Patient started Anastrozole 06/19/2017.  [place] saw Dr. Precious Haws and reported patient has several fibroids.  Patient pelvic floor strength is 5/5.  Patient has tenderness located on the left vaginal sphincter muscle and  cyst located on the lower left vulvar area and is red with tenderness.  Patient reports during intercourse she would have pain in the same area.  Vulva appears to be dry and thin.  Patient reports issues of constipation.  Patient was educated about vaginal health, vaginal moisturizers, lubricants, toileting, and abdominal massage.  Patient was educated on her anatomy with a mirror.      History and Personal Factors relevant to plan of care:  breast cancer estrogen positive with radiation and chemotherapy    Clinical Presentation  Stable    Clinical Presentation due to:  stable condition    Clinical Decision Making  Low    Rehab Potential  Excellent    Clinical Impairments Affecting Rehab Potential  breast cancer estrogen positive with radiation and chemotherapy    PT Frequency  One time visit    PT Duration  Other (comment) 1 time visit with initial evaluation and education    PT Treatment/Interventions  Patient/family education    PT Next Visit Plan  Current home exercise program from pelvic floor physical therapist.     PT Home Exercise Plan  Current HEP from pelvic floor physical therapy    Consulted and Agree with Plan of Care  Patient       Patient will benefit from skilled therapeutic intervention in order to improve the following deficits and impairments:  Increased muscle spasms  Visit Diagnosis: Other muscle spasm  Aftercare following surgery for neoplasm  Carcinoma of upper-inner quadrant of right breast in female, estrogen receptor positive ([place])     Problem List Patient Active Problem List   Diagnosis Date Noted  . Intramural and submucous leiomyoma of uterus 08/21/2017  . Port catheter in place 09/06/2016  . Genetic testing 05/09/2016  . Malignant neoplasm of upper-inner quadrant of right breast in female, estrogen receptor positive ([place]) 04/27/2016  . Family history of breast cancer   . Family history of ovarian cancer   . Family history of prostate cancer   .  Family history of pancreatic cancer     Earlie Counts, PT 09/08/17 11:50 AM   [facility] [address] [place], [place], [address] Phone: [phone]   Fax:  [phone]  Name: TASHAI CATINO MRN: [id number] Date of Birth: 04/21/1975 PHYSICAL THERAPY DISCHARGE SUMMARY  Visits from Start of Care: 1  Current functional level related to goals / functional outcomes: See above.    Remaining deficits: See above.    Education / Equipment: HEP Plan: Patient agrees to discharge.  Patient goals were met. Patient is being discharged due to meeting the stated rehab goals.  Thank you for the referral. Earlie Counts, PT 09/08/17 11:50 AM  ?????

## 2017-09-11 ENCOUNTER — Telehealth: Payer: Self-pay

## 2017-09-11 NOTE — Telephone Encounter (Signed)
Incoming call from pt regarding rescheduling her appt, she would like one sooner than July 1st.  Rescheduled for 6-26 per Joylene John NP at 9:15 am.  Pt voiced understanding.  She asked about recent MRI- per [facility] NP, explained to pt results pertained to fibroids, their size. Pt voiced understanding and will discuss further at upcoming appt. No other needs per pt at this time.

## 2017-09-18 NOTE — Progress Notes (Signed)
Progress Note: Gyn-Onc Established Patient FOLLOW-UP  Consult was requested by Dr. Jana [name] for the evaluation of Candice [name] 42 y.o. female  CC:  Chief Complaint  Patient presents with  . Estrogen receptor positive    HPI: [name]  is a very nice 42 y.o.  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  This was 95% estrogen receptor positive; 100% progesterone receptor positive.  Treatment for her breast cancer including surgery chemotherapy and radiation.  She states she completed all treatment September 2018.  She was on tamoxifen for some time which was then switched to anastrozole she states after she had a menstrual cycle in January 2019.  Her ovaries are suppressed currently by monthly goserelin.  Referred for discussion of BSO for permanent surgical hormonal suppression.   She admits to a past history of a fibroid uterus and myomectomy.  [facility] My Risk genetics panel 04/27/16 and that found no deleterious mutations.  Interval history:  Given her history of fibroids and based on clinical exam I ordered a transvaginal ultrasound which is noted below.  In summary the uterus was seen to be heterogeneous with multiple leiomyoma.  There is a simple appearing 2.8 cm right ovarian cyst left adnexa was normal. In planning for surgical considerations (ie hysterectomy if she desires) I recommended we get an MRI to look at the dimensions/locations of the fibroids to determine if laparoscopy may be reasonable.  Measurement of disease: Marland Kitchen Defer to Medical Oncology   Radiology (relevant to GYN visit)  04/2004  ULTRASOUND OF THE PELVIS:  Transabdominal and transvaginal ultrasound of the pelvis were performed. The uterus is prominent measuring 12.2 cm sagittally with a depth of 10.5 cm and width of 8.6 cm. The uterus is retroflexed. The endometrium measures 8.8 mm in thickness. However there is a large fibroid emanating from the posterior  aspect of the upper midbody of the uterus measuring 8.2 x 8.5 x 9.1 cm. Complex cyst is noted emanating from the right ovary measuring 4.2 x 2.9 x 3.4 cm. With the diffuse internal echogenicity of this cyst this may represent hemorrhagic cyst or possibly endometrioma in the proper clinical setting. Followup ultrasound in two months is recommended to assess this area further. The right ovary measures 5.1 x 3.8 x 4.2 cm. The left ovary measures 3.9 x 1.8 x 2.9 cm with small subcentimeter follicular cysts present. Only a small amount of free fluid is noted.   IMPRESSION:  1. Large fibroid emanates from the posterior upper body of the uterus measuring 8.2 x 9.5 x 9.1 cm.   2. Smaller fibroid near the fundus of 2.4 x 1.8 x 3.1 cm.   3. Complex cyst in the right ovary of 4.2 x 2.9 x 3.4 cm. See above.  07/14/2017-transabdominal transvaginal ultrasound the pelvis-compared to 2006-impression is multiple uterine leiomyoma largest 5.6 cm.  Uterus measures 7.5 x 7.8 x 8.9 cm there is an exophytic left fundal lesion 5.4 x 3.3 x 6.9 cm.  A mid right leiomyoma 3 x 3.1 x 2.8 cm.  A left fundal leiomyoma 5.6 x 2.8 x 4.9 cm.  Also noted is a right ovary 5.3 x 2.6 x 1.8 with a simple appearing cyst 2.8 x 2.7 x 2.2 cm.    Oncologic History:      Malignant neoplasm of upper-inner quadrant of right breast in female, estrogen receptor positive ([place])   04/21/2016 Initial Biopsy    Right breast upper inner  quadrant biopsy: IDC, grade 2, ER/PR positive, Her-2 negative, Ki-67 20%.      04/27/2016 Initial Diagnosis    Malignant neoplasm of upper-inner quadrant of right breast in female, estrogen receptor positive ([place])      04/27/2016 -  Anti-estrogen oral therapy    Tamoxifen started, then stopped at start of chemotherapy and resumed in 12/26/2016, Goserelin started on 05/01/2017, to change to Anastrozole after 2-3 doses of Goserelin      05/09/2016 Genetic Testing    Patient has genetic testing done for personal and  family history of breast cancer. Negative genetic testing on the [facility] panel.  The [facility] gene panel offered by [employer] includes sequencing and deletion/duplication testing of the following 28 genes: APC, ATM, BARD1, BMPR1A, BRCA1, BRCA2, BRIP1, CHD1, CDK4, CDKN2A, CHEK2, EPCAM (large rearrangement only), MLH1, MSH2, MSH6, MUTYH, NBN, PALB2, PMS2, PTEN, RAD51C, RAD51D, SMAD4, STK11, and TP53. Sequencing was performed for select regions of POLE and POLD1, and large rearrangement analysis was performed for select regions of GREM1.       06/07/2016 Surgery    Right lumpectomy and SLNB: IDC, grade 2, 1.8 cm, margins neg, 3 SLN biopsied, 1 with metastatic disease, 1 with micrometastatic disease      06/07/2016 Miscellaneous    Mammaprint high risk, indicated need for chemo      07/12/2016 - 09/27/2016 Adjuvant Chemotherapy    Doxorubicin and cyclophosphamide x 4 followed by 2 of weekly Paclitaxel stopped due to reaction, followed by 1 of Abraxane stopped for the same reason.      11/01/2016 - 12/19/2016 Radiation Therapy     1. Right Breast, axilla, SCV: 50 Gy in 25 fractions                          2. Right Breast Boost: 10 Gy in 5 fractions          Current Meds:  Outpatient Encounter Medications as of 09/20/2017  Medication Sig  . anastrozole (ARIMIDEX) 1 MG tablet Take 1 tablet (1 mg total) by mouth daily.  Marland Kitchen gabapentin (NEURONTIN) 300 MG capsule Take 1 capsule (300 mg total) by mouth at bedtime.  Marland Kitchen goserelin (ZOLADEX) 10.8 MG injection Inject 10.8 mg into the skin once.   No facility-administered encounter medications on file as of 09/20/2017.     Allergy:  Allergies  Allergen Reactions  . Paclitaxel Itching  . Amoxicillin Rash    Social Hx:   Social History   Socioeconomic History  . Marital status: Single    Spouse name: Not on file  . Number of children: Not on file  . Years of education: Not on file  . Highest education level: Not on file   Occupational History  . Not on file  Social Needs  . Financial resource strain: Not on file  . Food insecurity:    Worry: Not on file    Inability: Not on file  . Transportation needs:    Medical: Not on file    Non-medical: Not on file  Tobacco Use  . Smoking status: Never Smoker  . Smokeless tobacco: Never Used  Substance and Sexual Activity  . Alcohol use: Yes    Alcohol/week: 0.6 oz    Types: 1 Glasses of wine per week    Comment: once per month  . Drug use: No  . Sexual activity: Not Currently    Partners: Male    Birth control/protection: None  Lifestyle  .  Physical activity:    Days per week: Not on file    Minutes per session: Not on file  . Stress: Not on file  Relationships  . Social connections:    Talks on phone: Not on file    Gets together: Not on file    Attends religious service: Not on file    Active member of club or organization: Not on file    Attends meetings of clubs or organizations: Not on file    Relationship status: Not on file  . Intimate partner violence:    Fear of current or ex partner: Not on file    Emotionally abused: Not on file    Physically abused: Not on file    Forced sexual activity: Not on file  Other Topics Concern  . Not on file  Social History Narrative  . Not on file    Past Surgical Hx:  Past Surgical History:  Procedure Laterality Date  . BARTHOLIN CYST MARSUPIALIZATION  09/04/2002  . BARTHOLIN GLAND CYST EXCISION Left 07/13/2005  . BREAST LUMPECTOMY WITH RADIOACTIVE SEED AND SENTINEL LYMPH NODE BIOPSY Right 06/07/2016   Procedure: BREAST LUMPECTOMY WITH RADIOACTIVE SEED AND SENTINEL LYMPH NODE BIOPSY;  Surgeon: Alphonsa Overall, MD;  Location: [place];  Service: General;  Laterality: Right;  . HYSTEROSCOPY  2016  . MYOMECTOMY  07/20/2004   Laparotomy  . OVARIAN CYST REMOVAL Right 07/20/2004  . PORTACATH PLACEMENT N/A 07/05/2016   REMOVED Fall 2018 ; Procedure: INSERTION PORT-A-CATH WITH [place];   Surgeon: Alphonsa Overall, MD;  Location: [place];  Service: General;  Laterality: N/A;    Past Medical Hx:  Past Medical History:  Diagnosis Date  . Breast cancer ([place]) 05/2016   right  . Chronic constipation   . Family history of cancer   . IBS (irritable bowel syndrome)    IBS - C (constipation)  . Personal history of chemotherapy   . Personal history of radiation therapy     Past Gynecological History:   GYNECOLOGIC HISTORY:  No LMP recorded. (Menstrual status: Chemotherapy). Menarche: 42 years old P 0 LMP January 2019 Contraceptive oral contraceptives greater than 10 years not currently used HRT relative only to her breast cancer treatment  Last Pap now on record from December 2017- for intraepithelial lesion no HPV testing was performed Family Hx:  Family History  Problem Relation Age of Onset  . Breast cancer Mother 23  . Pancreatic cancer Paternal Grandmother   . Prostate cancer Paternal Grandfather   . Ovarian cancer Other        MGMs sister  . Breast cancer Other        mother's maternal first cousin    Review of Systems: Review of Systems  Constitutional: Positive for unexpected weight change.  Gastrointestinal: Positive for abdominal distention and constipation.  Endocrine: Positive for hot flashes.  Musculoskeletal: Positive for back pain.  Skin: Positive for itching.  Neurological: Positive for numbness.  All other systems reviewed and are negative.    Vitals:  Blood pressure 117/60, pulse 70, temperature 98.1 F (36.7 C), temperature source Oral, resp. rate 20, height 5' 4" (1.626 m), weight 164 lb 11.2 oz (74.7 kg), SpO2 100 %. Body mass index is 28.27 kg/m.   Physical Exam:  ECOG PERFORMANCE STATUS: 1 - Symptomatic but completely ambulatory   General :  Well developed, 42 y.o., female in no apparent distress HEENT:  Normocephalic/atraumatic, symmetric, EOMI, eyelids normal Neck:   No visible masses.  Respiratory:   Respirations unlabored, no use of accessory muscles CV:   Deferred Breast:  Deferred Musculoskeletal: Normal muscle strength. Abdomen:  No visible masses or protrusion Extremities:  No visible edema or deformities Skin:   Normal inspection Neuro/Psych:  No focal motor deficit, no abnormal mental status. Normal gait. Normal affect. Alert and oriented to person, place, and time   Genito Urinary: From 07/10/2017 visit Vulva: Normal external female genitalia. Slight defect ~3:00 introitus due to Bartholins surgery. Bladder/urethra: Urethral meatus normal in size and location. No lesions or   masses, well supported bladder Speculum exam: Vagina: +thick white discharge No lesion, no bleeding. Cervix: Normal appearing, no lesions. Bimanual exam:  Uterus: difficult to determine given probable fibroids, mobile.  Adnexa: +mass in culdesac, posterior to uterus and low.  Rectovaginal:  Good tone, + impingement from mass that feels to be attached to uterus. No cul de sac nodularity, no parametrial involvement or nodularity.   Oncologic Summary: 1. Right breast cancer, hormone receptor positive a. S/p surgery, chemo, radiation b. Currently on ovarian suppression/anastrozole  Assessment/Plan: 1. Fibroid uterus o We reviewed her MRI images and report. o We previously reviewed the very low possibility of sarcomas however the patient is not having symptoms that make me concerned at this point o I suspect given the size of the fibroids this may be contributing to her intermittent constipation and feeling of "bloating" o If we proceed with hysterectomy it may be feasible laparoscopically but the uterus would end likely delivered through a mini-lap incision. 2. Recurrence risk for breast cancer o Defer to Dr. Jana [name] visit today. 3. Procedure discussion o If we did proceed to BSO alone we would likely be able to perform this laparoscopically  i. I did previously discuss that she would be in permanent  menopause after surgery and that we would not prescribe hormonal therapy following this o Risks of early menopause addressed including increased risk of osteoporosis, possibly heart disease. o If she wanted hysterectomy I might attempt laparoscopically given the largest fibroid is pedunculated, but there is a higher risk of open hysterectomy given the 3 fibroids that are fairly sizable. 4. Return when and if she would like to proceed either with BSO alone or Hyst/BSO  Face to face time with patient was 25 minutes. Over 50% of this time was spent on counseling and coordination of care.  Isabel Caprice, MD  09/20/2017, 5:39 PM  Cc: Raiford Simmonds, MD Lurline Del, MD

## 2017-09-19 NOTE — Progress Notes (Signed)
[facility]  [phone] [phone]     ID: Candice [name] DOB: 24-Oct-1975  MR#: [id number]  CSN#:[id number]  Patient Care Team: Candice Jordan, MD as PCP - General (Family Medicine) Candice Overall, MD as Consulting Physician (General Surgery) Candice [name], Candice Dad, MD as Consulting Physician (Oncology) Candice Gibson, MD as Attending Physician (Radiation Oncology) Candice Oman, MD as Attending Physician (Obstetrics and Gynecology) Candice Cockayne, MD as Referring Physician (Surgical Oncology) Candice [name], Candice Massed, NP as Nurse Practitioner (Hematology and Oncology) Candice Caprice, MD as Consulting Physician (Gynecologic Oncology) OTHER MD:  CHIEF COMPLAINT: estrogen receptor positive breast cancer  CURRENT TREATMENT: goserelin, anastrozole   BREAST CANCER HISTORY: From the original intake note:  Candice [name] was [facility] the [facility] [place] in December, at which time a mass in the right breast was noted. She was referred for [facility] and screening mammography was obtained, showing a possible mass in the right breast.on 04/21/2016 she underwent right diagnostic mammography with tomography and ultrasonography at the breast Center, and this confirmedan irregular spiculat in the right breast measuring 1.8 cm, which was not palpable to the mammographer. Ultrasonography confirmed an irregular hypoechoic right breast mass at the 12:30 o'clock position 4 cm from the nipple measuring 2.2 cm. The right axilla was sonographically benign.  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  Her subsequent history is detailed below  INTERVAL HISTORY: Candice [name] returns today for  follow-up and treatment of her estrogen receptor positive breast cancer accompanied by her sister. She continues on anastrozole, with good tolerance. She is having some hot flashes, but they are not intense. She is not taking gabapentin at night, because her hot flashes aren't bad at night. She also as some vaginal dryness.  She participated in our "pelvic health" program and feels she greatly benefited from it.   She also receives goserelin monthly, with good tolerance.  She has a dose due today  Since her last visit, she completed a pelvic MRI on 09/07/2017 showing: Diffuse uterine involvement by fibroids. Largest fibroid is pedunculated, arising from the left posterior corpus, measuring 6.9 cm in maximum diameter. Normal appearance of both ovaries  Candice [name] met with Dr. Gerarda Fraction today, to discuss possible total hysterectomy versus bilateral salpingo-oophorectomy only. She notes that if she had a total hysterectomy now, she has an increased risk for osteoporosis and heart disease as compared to women who continue to menstruate normally.  Of course that she continues on go [place] it would be the same story.She also has large fibroids that may be the real reason she has had persistent GI problems for several years.  REVIEW OF SYSTEMS: Candice [name] reports that she is doing well. She notes that she went to [place] to visit some family.  When she went out of town and forgot to take about a week's worth of anastrozole.  She did not feel any different while off the drug.  She is interviewing for a job at [employer]. She is considering waiting until she builds up sick and vacation time in order to have her total hysterectomy with BSO, assuming she does get the job. She denies unusual headaches, visual changes, nausea, vomiting, or dizziness. There has been no unusual cough, phlegm production, or pleurisy. This been  no change in bowel or bladder habits. She denies unexplained fatigue or unexplained weight loss, bleeding, rash, or  fever. A detailed review of systems was otherwise stable.   PAST MEDICAL HISTORY: Past Medical History:  Diagnosis Date  . Breast cancer ([place]) 05/2016   right  . Chronic constipation   . Family history of cancer   . IBS (irritable bowel syndrome)    IBS - C (constipation)  . Personal history of chemotherapy   . Personal history of radiation therapy     PAST SURGICAL HISTORY: Past Surgical History:  Procedure Laterality Date  . BARTHOLIN CYST MARSUPIALIZATION  09/04/2002  . BARTHOLIN GLAND CYST EXCISION Left 07/13/2005  . BREAST LUMPECTOMY WITH RADIOACTIVE SEED AND SENTINEL LYMPH NODE BIOPSY Right 06/07/2016   Procedure: BREAST LUMPECTOMY WITH RADIOACTIVE SEED AND SENTINEL LYMPH NODE BIOPSY;  Surgeon: Candice Overall, MD;  Location: [facility];  Service: General;  Laterality: Right;  . HYSTEROSCOPY  2016  . MYOMECTOMY  07/20/2004   Laparotomy  . OVARIAN CYST REMOVAL Right 07/20/2004  . PORTACATH PLACEMENT N/A 07/05/2016   REMOVED Fall 2018 ; Procedure: INSERTION PORT-A-CATH WITH [place];  Surgeon: Candice Overall, MD;  Location: [facility];  Service: General;  Laterality: N/A;    FAMILY HISTORY Family History  Problem Relation Age of Onset  . Breast cancer Mother 64  . Pancreatic cancer Paternal Grandmother   . Prostate cancer Paternal Grandfather   . Ovarian cancer Other        MGMs sister  . Breast cancer Other        mother's maternal first cousin  The patient's father, Candice [name], lives in [place] and works as a Geneticist, molecular. The patient's mother is a Quarry manager. The patient has no brothers and 1 sister, who works in [place] as a Network engineer. The patient's mother had breast cancer, stage 0, diagnosed at age 61. There are 2 maternal relatives (mother's sister and mother's niece) with breast cancer, both postmenopausal.  GYNECOLOGIC HISTORY:  Menarche age 53, the patient is GX P0. No LMP recorded. (Menstrual status: Chemotherapy).   SOCIAL HISTORY:    The patient has a PhD degree and has worked for [employer] here and in [place]. 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  She is applying for a full-time position.  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 She generally lives in [place] by herself although she is currently staying at her parents.    ADVANCED DIRECTIVES: Not in place. The patient tells me she intends to name her sister as her healthcare power of attorney.   HEALTH MAINTENANCE: Social History   Tobacco Use  . Smoking status: Never Smoker  . Smokeless tobacco: Never Used  Substance Use Topics  . Alcohol use: Yes    Alcohol/week: 0.6 oz    Types: 1 Glasses of wine per week    Comment: once per month  . Drug use: No     Colonoscopy: July 2016, in [place]  PAP:  Bone density: Never   Allergies  Allergen Reactions  . Paclitaxel Itching  . Amoxicillin Rash    Current Outpatient Medications  Medication Sig Dispense Refill  . anastrozole (ARIMIDEX) 1 MG tablet Take 1 tablet (1 mg total) by mouth daily. 30 tablet 2  . gabapentin (NEURONTIN) 300 MG capsule Take 1 capsule (300 mg total) by mouth at bedtime. 90 capsule 4  . goserelin (ZOLADEX) 10.8 MG injection Inject 10.8 mg into the skin once.  No current facility-administered medications for this visit.     OBJECTIVE: young African-American woman who appears well  Vitals:   09/20/17 1117  BP: 116/75  Pulse: (!) 50  Resp: 18  Temp: 97.6 F (36.4 C)  SpO2: 100%     Body mass index is 28.39 kg/m.    ECOG FS: 1 - Symptomatic but completely ambulatory Filed Weights   09/20/17 1117  Weight: 165 lb 6.4 oz (75 kg)    Sclerae unicteric, EOMs intact Oropharynx clear and moist No cervical or supraclavicular adenopathy Lungs no rales or rhonchi Heart regular rate and rhythm Abd soft, nontender, positive bowel sounds MSK no focal spinal tenderness, no upper extremity lymphedema Neuro: nonfocal, well oriented,  appropriate affect Breasts: The right breast is status post lumpectomy followed by radiation.  There is no evidence of local recurrence.  The left breast is benign.  Both axillae are benign.   LAB RESULTS:  CMP     Component Value Date/Time   NA 142 09/20/2017 1052   NA 142 02/15/2017 1054   K 3.9 09/20/2017 1052   K 4.1 02/15/2017 1054   CL 105 09/20/2017 1052   CO2 29 09/20/2017 1052   CO2 25 02/15/2017 1054   GLUCOSE 76 09/20/2017 1052   GLUCOSE 71 02/15/2017 1054   BUN 11 09/20/2017 1052   BUN 12.3 02/15/2017 1054   CREATININE 0.83 09/20/2017 1052   CREATININE 0.9 02/15/2017 1054   CALCIUM 9.3 09/20/2017 1052   CALCIUM 9.6 02/15/2017 1054   PROT 6.8 09/20/2017 1052   PROT 7.0 02/15/2017 1054   ALBUMIN 4.0 09/20/2017 1052   ALBUMIN 3.8 02/15/2017 1054   AST 24 09/20/2017 1052   AST 25 02/15/2017 1054   ALT 22 09/20/2017 1052   ALT 15 02/15/2017 1054   ALKPHOS 72 09/20/2017 1052   ALKPHOS 62 02/15/2017 1054   BILITOT 0.6 09/20/2017 1052   BILITOT 0.60 02/15/2017 1054   GFRNONAA >60 09/20/2017 1052   GFRAA >60 09/20/2017 1052    INo results found for: SPEP, UPEP  Lab Results  Component Value Date   WBC 2.7 (L) 09/20/2017   NEUTROABS 1.0 (L) 09/20/2017   HGB 14.1 09/20/2017   HCT 41.6 09/20/2017   MCV 94.2 09/20/2017   PLT 195 09/20/2017      Chemistry      Component Value Date/Time   NA 142 09/20/2017 1052   NA 142 02/15/2017 1054   K 3.9 09/20/2017 1052   K 4.1 02/15/2017 1054   CL 105 09/20/2017 1052   CO2 29 09/20/2017 1052   CO2 25 02/15/2017 1054   BUN 11 09/20/2017 1052   BUN 12.3 02/15/2017 1054   CREATININE 0.83 09/20/2017 1052   CREATININE 0.9 02/15/2017 1054      Component Value Date/Time   CALCIUM 9.3 09/20/2017 1052   CALCIUM 9.6 02/15/2017 1054   ALKPHOS 72 09/20/2017 1052   ALKPHOS 62 02/15/2017 1054   AST 24 09/20/2017 1052   AST 25 02/15/2017 1054   ALT 22 09/20/2017 1052   ALT 15 02/15/2017 1054   BILITOT 0.6 09/20/2017  1052   BILITOT 0.60 02/15/2017 1054       No results found for: LABCA2  No components found for: LABCA125  No results for input(s): INR in the last 168 hours.  Urinalysis No results found for: COLORURINE, APPEARANCEUR, LABSPEC, [place], GLUCOSEU, HGBUR, BILIRUBINUR, KETONESUR, PROTEINUR, UROBILINOGEN, NITRITE, LEUKOCYTESUR   STUDIES: Mr Pelvis W Wo Contrast  Result Date: 09/07/2017 CLINICAL DATA:  Symptomatic fibroids. EXAM: MRI PELVIS WITHOUT AND WITH CONTRAST TECHNIQUE: Multiplanar multisequence MR imaging of the pelvis was performed both before and after administration of intravenous contrast. CONTRAST:  29m MULTIHANCE GADOBENATE DIMEGLUMINE 529 MG/ML IV SOLN COMPARISON:  Ultrasound on 07/14/2017 FINDINGS: Urinary Tract: No bladder or urethral abnormality identified. Bowel:  Unremarkable visualized pelvic bowel loops. Vascular/Lymphatic: No pathologically enlarged lymph nodes or other significant abnormality. Reproductive: -- Uterus: Measures 8.6 x 7.3 x 8.0 cm (volume = 260 cm^3). Numerous small intramural fibroids are seen which involve the uterus diffusely. The largest intramural fibroid is located in the anterior corpus and measures 3.0 cm in maximum diameter. A subserosal fibroid is seen arising from the posterior lower uterine segment, which measures 3.7 cm in maximum diameter. The largest fibroid is a pedunculated fibroid arising from the left posterior corpus, which measures 6.9 cm in maximum diameter. -- Intracavitary fibroids:  None. -- Pedunculated fibroids: 6.9 cm pedunculated fibroid arising from the left posterior corpus, as described above. The soft tissue pedicle of attachment to the uterus measures approximately 2.3 cm in thickness on image 15/4. -- Fibroid contrast enhancement: All fibroids show contrast enhancement, without significant degeneration/devascularization. -- Right ovary:  Appears normal.  No mass identified. -- Left ovary:  Appears normal.  No mass identified.  Other: Trace amount of free fluid in pelvic cul-de-sac is within a physiologic range. Musculoskeletal:  Unremarkable. IMPRESSION: Diffuse uterine involvement by fibroids. Largest fibroid is pedunculated, arising from the left posterior corpus, measuring 6.9 cm in maximum diameter. Normal appearance of both ovaries. Electronically Signed   By: JEarle GellM.D.   On: 09/07/2017 17:20     ELIGIBLE FOR AVAILABLE RESEARCH PROTOCOL: no  ASSESSMENT: 42y.o. [place] post right breast upper inner quadrant biopsy 04/21/2016 for a clinical T2 N0, stage IIa invasive ductal carcinoma, grade 2, estrogen and progesterone receptor positive, HER-2 nonamplified, with an MIB-1 of 20%  (a) biopsy of 2 additional suspicious areas in the right breast 05/12/2016 was benign  (1) started tamoxifen 04/27/2016, Discontinued at the start of chemotherapy  (2) Oncotype DX obtained from the original biopsy showed a score of 14, predicting a 10 year risk of recurrence outside the breast of 9% if the patient's only systemic therapy is tamoxifen for 5 years. It also predicts no benefit from chemotherapy.   (3) patient is not interested in fertility preservation  (4) [facility] mutations in APC, ATM, BARD1, BMPR1A, BRCA1, BRCA2, BRIP1, CHD1, CDK4, CDKN2A, CHEK2, EPCAM (large rearrangement only), MLH1, MSH2, MSH6, MUTYH, NBN, PALB2, PMS2, PTEN, RAD51C, RAD51D, SMAD4, STK11, and TP53. Sequencing was performed for select regions of POLE and POLD1, and large rearrangement analysis was performed for select regions of GREM1.   (5) status post right lumpectomy with sentinel lymph node sampling 06/07/2016 for a pT1c pN1, stage IB invasive ductal carcinoma, grade 2, with negative margins   (6) Mammaprint sent from the final surgical sample was read as high risk,  indicating a need for chemotherapy   (7) doxorubicin and cyclophosphamide in dose dense fashion 4  started 07/12/2016, completed 08/23/2016 followed by paclitaxel weekly 12 started 09/06/2016 (received 2 cycles), changed to Abraxane on 09/27/2016 due to reaction to Paclitaxel: Abraxane discontinued after 1 cycle because of the same reaction.  (8) adjuvant radiation 11/01/16-12/19/16 Site/dose:   1. Right Breast: 50 Gy in 25 fractions  2. Right Breast Boost: 10 Gy in 5 fractions                          3. Right Breast SCV: 50 Gy in 25 fractions    (9) resumed tamoxifen 12/26/2016, discontinued January 2019 in preparation for anastrozole  (10) goserelin started 05/01/2017  (11) anastrozole started 06/19/2017  PLAN: Lillion is now a little over a year out from definitive surgery for her breast cancer with no evidence of disease activity.  This is very favorable.  She continues on anastrozole with good tolerance and the plan will be to continue that for a total of 7 years.  She is tolerating goserelin well.  She would much prefer to have her ovaries removed which would avoid the whole issue of having to come here in the discomfort of the shots.  Maecyn is very clear that she would not want children.  She also understands that early menopause does have some costs and issues regarding bone density probably would not be a major concern since age she is extremely active at the gym and we can prophylax with denosumab for example.  She will have a cardiovascular risk closer to that of a man then a woman.  She has consider all this and she is very interested in proceeding with total abdominal hysterectomy and bilateral salpingo-oophorectomy  She is hopeful that this actually may resolve her long-standing GI complaints although she understands we cannot promise that  The only flying the ointment is that if she does get the job at [employer] she will want to wait until she  has enough vacation and sick time to take up to 6 weeks postop as needed.  Until then we will continue to go [place] as we are doing now  She will see me again in November.  She knows to call for any problems that may develop before that visit.   Candice [name], Candice Dad, MD  09/20/17 11:41 AM Medical Oncology and Hematology [facility] [address] [place],  [address] Tel. [phone]    Fax. [phone]  Alice Rieger, am acting as scribe for Chauncey Cruel MD.  I, Lurline Del MD, have reviewed the above documentation for accuracy and completeness, and I agree with the above.

## 2017-09-20 ENCOUNTER — Encounter: Payer: Self-pay | Admitting: Obstetrics

## 2017-09-20 ENCOUNTER — Inpatient Hospital Stay: Payer: Medicaid Other

## 2017-09-20 ENCOUNTER — Inpatient Hospital Stay: Payer: Medicaid Other | Attending: Adult Health | Admitting: Oncology

## 2017-09-20 ENCOUNTER — Inpatient Hospital Stay (HOSPITAL_BASED_OUTPATIENT_CLINIC_OR_DEPARTMENT_OTHER): Payer: Medicaid Other | Admitting: Obstetrics

## 2017-09-20 ENCOUNTER — Ambulatory Visit: Payer: Medicaid Other

## 2017-09-20 VITALS — BP 116/75 | HR 50 | Temp 97.6°F | Resp 18 | Ht 64.0 in | Wt 165.4 lb

## 2017-09-20 VITALS — BP 117/60 | HR 70 | Temp 98.1°F | Resp 20 | Ht 64.0 in | Wt 164.7 lb

## 2017-09-20 DIAGNOSIS — Z79811 Long term (current) use of aromatase inhibitors: Secondary | ICD-10-CM

## 2017-09-20 DIAGNOSIS — C50211 Malignant neoplasm of upper-inner quadrant of right female breast: Secondary | ICD-10-CM | POA: Insufficient documentation

## 2017-09-20 DIAGNOSIS — Z5111 Encounter for antineoplastic chemotherapy: Secondary | ICD-10-CM | POA: Diagnosis present

## 2017-09-20 DIAGNOSIS — Z95828 Presence of other vascular implants and grafts: Secondary | ICD-10-CM

## 2017-09-20 DIAGNOSIS — D259 Leiomyoma of uterus, unspecified: Secondary | ICD-10-CM

## 2017-09-20 DIAGNOSIS — Z170 Estrogen receptor positive status [ER+]: Secondary | ICD-10-CM

## 2017-09-20 DIAGNOSIS — Z923 Personal history of irradiation: Secondary | ICD-10-CM | POA: Diagnosis not present

## 2017-09-20 DIAGNOSIS — Z9221 Personal history of antineoplastic chemotherapy: Secondary | ICD-10-CM | POA: Insufficient documentation

## 2017-09-20 LAB — COMPREHENSIVE METABOLIC PANEL
ALT: 22 U/L (ref 0–44)
AST: 24 U/L (ref 15–41)
Albumin: 4 g/dL (ref 3.5–5.0)
Alkaline Phosphatase: 72 U/L (ref 38–126)
Anion gap: 8 (ref 5–15)
BUN: 11 mg/dL (ref 6–20)
CO2: 29 mmol/L (ref 22–32)
Calcium: 9.3 mg/dL (ref 8.9–10.3)
Chloride: 105 mmol/L (ref 98–111)
Creatinine, Ser: 0.83 mg/dL (ref 0.44–1.00)
GFR calc Af Amer: >60 mL/min (ref >60)
GFR calc non Af Amer: >60 mL/min (ref >60)
Glucose, Bld: 76 mg/dL (ref 70–99)
Potassium: 3.9 mmol/L (ref 3.5–5.1)
Sodium: 142 mmol/L (ref 135–145)
Total Bilirubin: 0.6 mg/dL (ref 0.3–1.2)
Total Protein: 6.8 g/dL (ref 6.5–8.1)

## 2017-09-20 LAB — CBC WITH DIFFERENTIAL/PLATELET
Basophils Absolute: 0 10*3/uL (ref 0.0–0.1)
Basophils Relative: 1 %
Eosinophils Absolute: 0.1 10*3/uL (ref 0.0–0.5)
Eosinophils Relative: 3 %
HCT: 41.6 % (ref 34.8–46.6)
Hemoglobin: 14.1 g/dL (ref 11.6–15.9)
Lymphocytes Relative: 49 %
Lymphs Abs: 1.3 10*3/uL (ref 0.9–3.3)
MCH: 32 pg (ref 25.1–34.0)
MCHC: 34 g/dL (ref 31.5–36.0)
MCV: 94.2 fL (ref 79.5–101.0)
Monocytes Absolute: 0.2 10*3/uL (ref 0.1–0.9)
Monocytes Relative: 8 %
Neutro Abs: 1 10*3/uL — ABNORMAL LOW (ref 1.5–6.5)
Neutrophils Relative %: 39 %
Platelets: 195 10*3/uL (ref 145–400)
RBC: 4.42 MIL/uL (ref 3.70–5.45)
RDW: 13.1 % (ref 11.2–14.5)
WBC: 2.7 10*3/uL — ABNORMAL LOW (ref 3.9–10.3)

## 2017-09-20 MED ORDER — GOSERELIN ACETATE 3.6 MG ~~LOC~~ IMPL
3.6000 mg | DRUG_IMPLANT | SUBCUTANEOUS | Status: DC
  Administered 2017-09-20: 3.6 mg via SUBCUTANEOUS

## 2017-09-20 NOTE — Patient Instructions (Signed)
Goserelin injection What is this medicine? GOSERELIN (GOE se rel in) is similar to a hormone found in the body. It lowers the amount of sex hormones that the body makes. Men will have lower testosterone levels and women will have lower estrogen levels while taking this medicine. In men, this medicine is used to treat prostate cancer; the injection is either given once per month or once every 12 weeks. A once per month injection (only) is used to treat women with endometriosis, dysfunctional uterine bleeding, or advanced breast cancer. This medicine may be used for other purposes; ask your health care provider or pharmacist if you have questions. COMMON BRAND NAME(S): Zoladex What should I tell my health care provider before I take this medicine? They need to know if you have any of these conditions (some only apply to women): -diabetes -heart disease or previous heart attack -high blood pressure -high cholesterol -kidney disease -osteoporosis or low bone density -problems passing urine -spinal cord injury -stroke -tobacco smoker -an unusual or allergic reaction to goserelin, hormone therapy, other medicines, foods, dyes, or preservatives -pregnant or trying to get pregnant -breast-feeding How should I use this medicine? This medicine is for injection under the skin. It is given by a health care professional in a hospital or clinic setting. Men receive this injection once every 4 weeks or once every 12 weeks. Women will only receive the once every 4 weeks injection. Talk to your pediatrician regarding the use of this medicine in children. Special care may be needed. Overdosage: If you think you have taken too much of this medicine contact a poison control center or emergency room at once. NOTE: This medicine is only for you. Do not share this medicine with others. What if I miss a dose? It is important not to miss your dose. Call your doctor or health care professional if you are unable to  keep an appointment. What may interact with this medicine? -female hormones like estrogen -herbal or dietary supplements like black cohosh, chasteberry, or DHEA -female hormones like testosterone -prasterone This list may not describe all possible interactions. Give your health care provider a list of all the medicines, herbs, non-prescription drugs, or dietary supplements you use. Also tell them if you smoke, drink alcohol, or use illegal drugs. Some items may interact with your medicine. What should I watch for while using this medicine? Visit your doctor or health care professional for regular checks on your progress. Your symptoms may appear to get worse during the first weeks of this therapy. Tell your doctor or healthcare professional if your symptoms do not start to get better or if they get worse after this time. Your bones may get weaker if you take this medicine for a long time. If you smoke or frequently drink alcohol you may increase your risk of bone loss. A family history of osteoporosis, chronic use of drugs for seizures (convulsions), or corticosteroids can also increase your risk of bone loss. Talk to your doctor about how to keep your bones strong. This medicine should stop regular monthly menstration in women. Tell your doctor if you continue to menstrate. Women should not become pregnant while taking this medicine or for 12 weeks after stopping this medicine. Women should inform their doctor if they wish to become pregnant or think they might be pregnant. There is a potential for serious side effects to an unborn child. Talk to your health care professional or pharmacist for more information. Do not breast-feed an infant while taking   this medicine. Men should inform their doctors if they wish to father a child. This medicine may lower sperm counts. Talk to your health care professional or pharmacist for more information. What side effects may I notice from receiving this  medicine? Side effects that you should report to your doctor or health care professional as soon as possible: -allergic reactions like skin rash, itching or hives, swelling of the face, lips, or tongue -bone pain -breathing problems -changes in vision -chest pain -feeling faint or lightheaded, falls -fever, chills -pain, swelling, warmth in the leg -pain, tingling, numbness in the hands or feet -signs and symptoms of low blood pressure like dizziness; feeling faint or lightheaded, falls; unusually weak or tired -stomach pain -swelling of the ankles, feet, hands -trouble passing urine or change in the amount of urine -unusually high or low blood pressure -unusually weak or tired Side effects that usually do not require medical attention (report to your doctor or health care professional if they continue or are bothersome): -change in sex drive or performance -changes in breast size in both males and females -changes in emotions or moods -headache -hot flashes -irritation at site where injected -loss of appetite -skin problems like acne, dry skin -vaginal dryness This list may not describe all possible side effects. Call your doctor for medical advice about side effects. You may report side effects to FDA at 1-800-FDA-1088. Where should I keep my medicine? This drug is given in a hospital or clinic and will not be stored at home. NOTE: This sheet is a summary. It may not cover all possible information. If you have questions about this medicine, talk to your doctor, pharmacist, or health care provider.  2018 Elsevier/Gold Standard (2013-05-21 11:10:35)  

## 2017-09-20 NOTE — Patient Instructions (Signed)
1. Call the office about one month before you plan to undergo surgery

## 2017-09-25 ENCOUNTER — Ambulatory Visit: Payer: Medicaid Other | Admitting: Obstetrics

## 2017-10-18 ENCOUNTER — Encounter: Payer: Self-pay | Admitting: Obstetrics

## 2017-10-18 ENCOUNTER — Inpatient Hospital Stay: Payer: Medicaid Other | Attending: Adult Health

## 2017-10-18 ENCOUNTER — Inpatient Hospital Stay (HOSPITAL_BASED_OUTPATIENT_CLINIC_OR_DEPARTMENT_OTHER): Payer: Medicaid Other | Admitting: Obstetrics

## 2017-10-18 VITALS — BP 118/65 | HR 68 | Temp 97.7°F | Resp 20 | Ht 64.0 in | Wt 163.3 lb

## 2017-10-18 DIAGNOSIS — Z170 Estrogen receptor positive status [ER+]: Secondary | ICD-10-CM

## 2017-10-18 DIAGNOSIS — C50211 Malignant neoplasm of upper-inner quadrant of right female breast: Secondary | ICD-10-CM | POA: Insufficient documentation

## 2017-10-18 DIAGNOSIS — Z79818 Long term (current) use of other agents affecting estrogen receptors and estrogen levels: Secondary | ICD-10-CM

## 2017-10-18 DIAGNOSIS — Z7189 Other specified counseling: Secondary | ICD-10-CM | POA: Diagnosis not present

## 2017-10-18 DIAGNOSIS — Z95828 Presence of other vascular implants and grafts: Secondary | ICD-10-CM

## 2017-10-18 MED ORDER — GOSERELIN ACETATE 3.6 MG ~~LOC~~ IMPL
3.6000 mg | DRUG_IMPLANT | SUBCUTANEOUS | Status: DC
  Administered 2017-10-18: 3.6 mg via SUBCUTANEOUS

## 2017-10-18 MED ORDER — GOSERELIN ACETATE 3.6 MG ~~LOC~~ IMPL
DRUG_IMPLANT | SUBCUTANEOUS | Status: AC
  Filled 2017-10-18: qty 3.6

## 2017-10-18 NOTE — Patient Instructions (Addendum)
Preparing for your Surgery  Plan for surgery in December with Dr. Precious Haws at [facility] will be scheduled for an exploratory laparotomy, total abdominal hysterectomy, bilateral salpingo-oophorectomy, possible staging.  You will meet with Joylene John, NP prior to your surgery to discuss pre-op instructions and discuss any questions or concerns.  Pre-operative Testing -You will receive a phone call from presurgical testing at [facility] to arrange for a pre-operative testing appointment before your surgery.  This appointment normally occurs one to two weeks before your scheduled surgery.   -Bring your insurance card, copy of an advanced directive if applicable, medication list  -At that visit, you will be asked to sign a consent for a possible blood transfusion in case a transfusion becomes necessary during surgery.  The need for a blood transfusion is rare but having consent is a necessary part of your care.     -You should not be taking blood thinners or aspirin at least ten days prior to surgery unless instructed by your surgeon.  Day Before Surgery at [place] will need to take in a full liquid diet the day before surgery.  Avoid carbonated beverages.  Your role in recovery Your role is to become active as soon as directed by your doctor, while still giving yourself time to heal.  Rest when you feel tired. You will be asked to do the following in order to speed your recovery:  - Cough and breathe deeply. This helps toclear and expand your lungs and can prevent pneumonia. You may be given a spirometer to practice deep breathing. A staff member will show you how to use the spirometer. - Do mild physical activity. Walking or moving your legs help your circulation and body functions return to normal. A staff member will help you when you try to walk and will provide you with simple exercises. Do not try to get up or walk alone the first time. -  Actively manage your pain. Managing your pain lets you move in comfort. We will ask you to rate your pain on a scale of zero to 10. It is your responsibility to tell your doctor or nurse where and how much you hurt so your pain can be treated.  Special Considerations -If you are diabetic, you may be placed on insulin after surgery to have closer control over your blood sugars to promote healing and recovery.  This does not mean that you will be discharged on insulin.  If applicable, your oral antidiabetics will be resumed when you are tolerating a solid diet.  -Your final pathology results from surgery should be available by the Friday after surgery and the results will be relayed to you when available.  -Dr. Lahoma Crocker is the Surgeon that assists your GYN Oncologist with surgery.  The next day after your surgery you will either see your GYN Oncologist or Dr. Lahoma Crocker.   Blood Transfusion Information WHAT IS A BLOOD TRANSFUSION? A transfusion is the replacement of blood or some of its parts. Blood is made up of multiple cells which provide different functions.  Red blood cells carry oxygen and are used for blood loss replacement.  White blood cells fight against infection.  Platelets control bleeding.  Plasma helps clot blood.  Other blood products are available for specialized needs, such as hemophilia or other clotting disorders. BEFORE THE TRANSFUSION  Who gives blood for transfusions?   You may be able to donate blood to be used at a  later date on yourself (autologous donation).  Relatives can be asked to donate blood. This is generally not any safer than if you have received blood from a stranger. The same precautions are taken to ensure safety when a relative's blood is donated.  Healthy volunteers who are fully evaluated to make sure their blood is safe. This is blood bank blood. Transfusion therapy is the safest it has ever been in the practice of medicine.  Before blood is taken from a donor, a complete history is taken to make sure that person has no history of diseases nor engages in risky social behavior (examples are intravenous drug use or sexual activity with multiple partners). The donor's travel history is screened to minimize risk of transmitting infections, such as malaria. The donated blood is tested for signs of infectious diseases, such as HIV and hepatitis. The blood is then tested to be sure it is compatible with you in order to minimize the chance of a transfusion reaction. If you or a relative donates blood, this is often done in anticipation of surgery and is not appropriate for emergency situations. It takes many days to process the donated blood. RISKS AND COMPLICATIONS Although transfusion therapy is very safe and saves many lives, the main dangers of transfusion include:   Getting an infectious disease.  Developing a transfusion reaction. This is an allergic reaction to something in the blood you were given. Every precaution is taken to prevent this. The decision to have a blood transfusion has been considered carefully by your caregiver before blood is given. Blood is not given unless the benefits outweigh the risks.  Full Liquid Diet  A full liquid diet may be used:  To help you transition from a clear liquid diet to a soft diet.  When your body is healing and can only tolerate foods that are easy to digest.  Before or after certain a procedure, test, or surgery (such as stomach or intestinal surgeries).  If you have trouble swallowing or chewing.  A full liquid diet includes fluids and foods that are liquid or will become liquid at room temperature. The full liquid diet gives you the proteins, fluids, salts, and minerals that you need for energy. If you continue this diet for more than 72 hours, talk to your health care provider about how many calories you need to consume. If you continue the diet for more than 5 days,  talk to your health care provider about taking a multivitamin or a nutritional supplement. What do I need to know about a full liquid diet?  You may have any liquid.  You may have any food that becomes a liquid at room temperature. The food is considered a liquid if it can be poured off a spoon at room temperature.  Drink one serving of citrus or vitamin C-enriched fruit juice daily. What foods can I eat? Grains Any grain food that can be pureed in soup (such as crackers, pasta, and rice). Hot cereal (such as farina or oatmeal) that has been blended. Talk to your health care provider or dietitian about these foods. Vegetables Pulp-free tomato or vegetable juice. Vegetables pureed in soup. Fruits Fruit juice, including nectars and juices with pulp. Meats and Other Protein Sources Eggs in custard, eggnog mix, and eggs used in ice cream or pudding. Strained meats, like in baby food, may be allowed. Consult your health care provider. Dairy Milk and milk-based beverages, including milk shakes and instant breakfast mixes. Smooth yogurt. Pureed cottage cheese. Avoid  these foods if they are not well tolerated. Beverages All beverages, including liquid nutritional supplements. Ask your health care provider if you can have carbonated beverages. They may not be well tolerated. Condiments Iodized salt, pepper, spices, and flavorings. Cocoa powder. Vinegar, ketchup, yellow mustard, smooth sauces (such as hollandaise, cheese sauce, or white sauce), and soy sauce. Sweets and Desserts Custard, smooth pudding. Flavored gelatin. Tapioca, junket. Plain ice cream, sherbet, fruit ices. Frozen ice pops, frozen fudge pops, pudding pops, and other frozen bars with cream. Syrups, including chocolate syrup. Sugar, honey, jelly. Fats and Oils Margarine, butter, cream, sour cream, and oils. Other Broth and cream soups. Strained, broth-based soups. The items listed above may not be a complete list of recommended  foods or beverages. Contact your dietitian for more options. What foods can I not eat? Grains All breads. Grains are not allowed unless they are pureed into soup. Vegetables Vegetables are not allowed unless they are juiced, or cooked and pureed into soup. Fruits Fruits are not allowed unless they are juiced. Meats and Other Protein Sources Any meat or fish. Cooked or raw eggs. Nut butters. Dairy Cheese. Condiments Stone ground mustards. Fats and Oils Fats that are coarse or chunky. Sweets and Desserts Ice cream or other frozen desserts that have any solids in them or on top, such as nuts, chocolate chips, and pieces of cookies. Cakes. Cookies. Candy. Others Soups with chunks or pieces in them. The items listed above may not be a complete list of foods and beverages to avoid. Contact your dietitian for more information. This information is not intended to replace advice given to you by your health care provider. Make sure you discuss any questions you have with your health care provider. Document Released: 03/14/2005 Document Revised: 08/20/2015 Document Reviewed: 01/17/2013 Elsevier Interactive Patient Education  2017 [employer].

## 2017-10-18 NOTE — Patient Instructions (Signed)
Goserelin injection What is this medicine? GOSERELIN (GOE se rel in) is similar to a hormone found in the body. It lowers the amount of sex hormones that the body makes. Men will have lower testosterone levels and women will have lower estrogen levels while taking this medicine. In men, this medicine is used to treat prostate cancer; the injection is either given once per month or once every 12 weeks. A once per month injection (only) is used to treat women with endometriosis, dysfunctional uterine bleeding, or advanced breast cancer. This medicine may be used for other purposes; ask your health care provider or pharmacist if you have questions. COMMON BRAND NAME(S): Zoladex What should I tell my health care provider before I take this medicine? They need to know if you have any of these conditions (some only apply to women): -diabetes -heart disease or previous heart attack -high blood pressure -high cholesterol -kidney disease -osteoporosis or low bone density -problems passing urine -spinal cord injury -stroke -tobacco smoker -an unusual or allergic reaction to goserelin, hormone therapy, other medicines, foods, dyes, or preservatives -pregnant or trying to get pregnant -breast-feeding How should I use this medicine? This medicine is for injection under the skin. It is given by a health care professional in a hospital or clinic setting. Men receive this injection once every 4 weeks or once every 12 weeks. Women will only receive the once every 4 weeks injection. Talk to your pediatrician regarding the use of this medicine in children. Special care may be needed. Overdosage: If you think you have taken too much of this medicine contact a poison control center or emergency room at once. NOTE: This medicine is only for you. Do not share this medicine with others. What if I miss a dose? It is important not to miss your dose. Call your doctor or health care professional if you are unable to  keep an appointment. What may interact with this medicine? -female hormones like estrogen -herbal or dietary supplements like black cohosh, chasteberry, or DHEA -female hormones like testosterone -prasterone This list may not describe all possible interactions. Give your health care provider a list of all the medicines, herbs, non-prescription drugs, or dietary supplements you use. Also tell them if you smoke, drink alcohol, or use illegal drugs. Some items may interact with your medicine. What should I watch for while using this medicine? Visit your doctor or health care professional for regular checks on your progress. Your symptoms may appear to get worse during the first weeks of this therapy. Tell your doctor or healthcare professional if your symptoms do not start to get better or if they get worse after this time. Your bones may get weaker if you take this medicine for a long time. If you smoke or frequently drink alcohol you may increase your risk of bone loss. A family history of osteoporosis, chronic use of drugs for seizures (convulsions), or corticosteroids can also increase your risk of bone loss. Talk to your doctor about how to keep your bones strong. This medicine should stop regular monthly menstration in women. Tell your doctor if you continue to menstrate. Women should not become pregnant while taking this medicine or for 12 weeks after stopping this medicine. Women should inform their doctor if they wish to become pregnant or think they might be pregnant. There is a potential for serious side effects to an unborn child. Talk to your health care professional or pharmacist for more information. Do not breast-feed an infant while taking   this medicine. Men should inform their doctors if they wish to father a child. This medicine may lower sperm counts. Talk to your health care professional or pharmacist for more information. What side effects may I notice from receiving this  medicine? Side effects that you should report to your doctor or health care professional as soon as possible: -allergic reactions like skin rash, itching or hives, swelling of the face, lips, or tongue -bone pain -breathing problems -changes in vision -chest pain -feeling faint or lightheaded, falls -fever, chills -pain, swelling, warmth in the leg -pain, tingling, numbness in the hands or feet -signs and symptoms of low blood pressure like dizziness; feeling faint or lightheaded, falls; unusually weak or tired -stomach pain -swelling of the ankles, feet, hands -trouble passing urine or change in the amount of urine -unusually high or low blood pressure -unusually weak or tired Side effects that usually do not require medical attention (report to your doctor or health care professional if they continue or are bothersome): -change in sex drive or performance -changes in breast size in both males and females -changes in emotions or moods -headache -hot flashes -irritation at site where injected -loss of appetite -skin problems like acne, dry skin -vaginal dryness This list may not describe all possible side effects. Call your doctor for medical advice about side effects. You may report side effects to FDA at 1-800-FDA-1088. Where should I keep my medicine? This drug is given in a hospital or clinic and will not be stored at home. NOTE: This sheet is a summary. It may not cover all possible information. If you have questions about this medicine, talk to your doctor, pharmacist, or health care provider.  2018 Elsevier/Gold Standard (2013-05-21 11:10:35)  

## 2017-10-20 ENCOUNTER — Telehealth: Payer: Self-pay | Admitting: *Deleted

## 2017-10-20 NOTE — Telephone Encounter (Signed)
Called, spoke with the patient. Patient scheduled for per op and post op appts for December.

## 2017-11-05 NOTE — Progress Notes (Signed)
[facility]    Progress Note: Gyn-Onc Established Patient FOLLOW-UP  Consult was originally requested by Dr. Jana Hakim for the evaluation of Candice [name] 42 y.o. female  CC:  Chief Complaint  Patient presents with  . Estrogen receptor positive    HPI: [name]  is a very nice 42 y.o.  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  This was 95% estrogen receptor positive; 100% progesterone receptor positive.  Treatment for her breast cancer including surgery chemotherapy and radiation.  She states she completed all treatment September 2018.  She was on tamoxifen for some time which was then switched to anastrozole she states after she had a menstrual cycle in January 2019.  Her ovaries are suppressed currently by monthly goserelin.  Referred for discussion of BSO for permanent surgical hormonal suppression.   She admits to a past history of a fibroid uterus and myomectomy.  [facility] My Risk genetics panel 04/27/16 and that found no deleterious mutations.  Given her history of fibroids and based on clinical exam I ordered a transvaginal ultrasound which is noted below.  In summary the uterus was seen to be heterogeneous with multiple leiomyoma.  There was a simple appearing 2.8 cm right ovarian cyst left adnexa was normal. In planning for surgical considerations (ie hysterectomy if she desires) I recommended we get an MRI to look at the dimensions/locations of the fibroids to determine if laparoscopy may be reasonable.  MRI revealed - Uterus: Measures 8.6 x 7.3 x 8.0 cm (volume = 260 cm^3). Numerous small intramural fibroids are seen which involve the uterus diffusely. The largest intramural fibroid is located in the anterior corpus and measures 3.0 cm in maximum diameter.  A subserosal fibroid is seen arising from the posterior lower uterine segment, which measures 3.7 cm in maximum  diameter. The largest fibroid is a pedunculated fibroid arising from the left posterior corpus, which measures 6.9 cm in maximum diameter. -- Intracavitary fibroids:  None. -- Pedunculated fibroids: 6.9 cm pedunculated fibroid arising from the left posterior corpus, as described above. The soft tissue pedicle of attachment to the uterus measures approximately 2.3 cm in thickness    Interval history:  Since her last visit she has had a chance to review long-term plans for her breast cancer with Dr. Jana Hakim in the face of  BSO +/-TAH versus not having the BSO. Dr. Jana Hakim also explained the risks of early menopause which we have discussed on previous visits. Given these considerations she is now more decisive and wanting to discuss moving forward with surgery. Her preference is BSO with hysterectomy in the hopes some of her GI issues may improve. (constipation and bloating). Her constraints for surgery include starting a new job and time off.  Measurement of disease: Marland Kitchen Defer to Medical Oncology   Radiology (relevant to GYN visit)  04/2004  ULTRASOUND OF THE PELVIS:  Transabdominal and transvaginal ultrasound of the pelvis were performed. The uterus is prominent measuring 12.2 cm sagittally with a depth of 10.5 cm and width of 8.6 cm. The uterus is retroflexed. The endometrium measures 8.8 mm in thickness. However there is a large fibroid emanating from the posterior aspect of the upper midbody of the uterus measuring 8.2 x 8.5 x 9.1 cm. Complex cyst is noted emanating from the right ovary measuring 4.2 x 2.9 x 3.4 cm. With the diffuse internal echogenicity of this cyst this may  represent hemorrhagic cyst or possibly endometrioma in the proper clinical setting. Followup ultrasound in two months is recommended to assess this area further. The right ovary measures 5.1 x 3.8 x 4.2 cm. The left ovary measures 3.9 x 1.8 x 2.9 cm with small subcentimeter follicular cysts present. Only a small amount of free  fluid is noted.   IMPRESSION:  1. Large fibroid emanates from the posterior upper body of the uterus measuring 8.2 x 9.5 x 9.1 cm.   2. Smaller fibroid near the fundus of 2.4 x 1.8 x 3.1 cm.   3. Complex cyst in the right ovary of 4.2 x 2.9 x 3.4 cm. See above.  07/14/2017-transabdominal transvaginal ultrasound the pelvis-compared to 2006-impression is multiple uterine leiomyoma largest 5.6 cm.  Uterus measures 7.5 x 7.8 x 8.9 cm there is an exophytic left fundal lesion 5.4 x 3.3 x 6.9 cm.  A mid right leiomyoma 3 x 3.1 x 2.8 cm.  A left fundal leiomyoma 5.6 x 2.8 x 4.9 cm.  Also noted is a right ovary 5.3 x 2.6 x 1.8 with a simple appearing cyst 2.8 x 2.7 x 2.2 cm.  09/07/17 - MRI pelvis - FINDINGS: Urinary Tract: No bladder or urethral abnormality identified. Bowel:  Unremarkable visualized pelvic bowel loops. Vascular/Lymphatic: No pathologically enlarged lymph nodes or other significant abnormality.  Reproductive: -- Uterus: Measures 8.6 x 7.3 x 8.0 cm (volume = 260 cm^3). Numerous small intramural fibroids are seen which involve the uterus diffusely. The largest intramural fibroid is located in the anterior corpus and measures 3.0 cm in maximum diameter.  A subserosal fibroid is seen arising from the posterior lower uterine segment, which measures 3.7 cm in maximum diameter. The largest fibroid is a pedunculated fibroid arising from the left posterior corpus, which measures 6.9 cm in maximum diameter. -- Intracavitary fibroids:  None. -- Pedunculated fibroids: 6.9 cm pedunculated fibroid arising from the left posterior corpus, as described above. The soft tissue pedicle of attachment to the uterus measures approximately 2.3 cm in thickness on image 15/4. -- Fibroid contrast enhancement: All fibroids show contrast enhancement, without significant degeneration/devascularization. -- Right ovary:  Appears normal.  No mass identified. -- Left ovary:  Appears normal.  No mass identified. Other: Trace amount of  free fluid in pelvic cul-de-sac is within a physiologic range.  Musculoskeletal:  Unremarkable. IMPRESSION: Diffuse uterine involvement by fibroids. Largest fibroid is pedunculated, arising from the left posterior corpus, measuring 6.9 cm in maximum diameter. Normal appearance of both ovaries    Oncologic History:      Malignant neoplasm of upper-inner quadrant of right breast in female, estrogen receptor positive ([place])   04/21/2016 Initial Biopsy    Right breast upper inner quadrant biopsy: IDC, grade 2, ER/PR positive, Her-2 negative, Ki-67 20%.    04/27/2016 Initial Diagnosis    Malignant neoplasm of upper-inner quadrant of right breast in female, estrogen receptor positive ([place])    04/27/2016 -  Anti-estrogen oral therapy    Tamoxifen started, then stopped at start of chemotherapy and resumed in 12/26/2016, Goserelin started on 05/01/2017, to change to Anastrozole after 2-3 doses of Goserelin    05/09/2016 Genetic Testing    Patient has genetic testing done for personal and family history of breast cancer. Negative genetic testing on the [facility] panel.  The [facility] gene panel offered by [employer] includes sequencing and deletion/duplication testing of the following 28 genes: APC, ATM, BARD1, BMPR1A, BRCA1, BRCA2, BRIP1, CHD1, CDK4, CDKN2A, CHEK2, EPCAM (large rearrangement  only), MLH1, MSH2, MSH6, MUTYH, NBN, PALB2, PMS2, PTEN, RAD51C, RAD51D, SMAD4, STK11, and TP53. Sequencing was performed for select regions of POLE and POLD1, and large rearrangement analysis was performed for select regions of GREM1.     06/07/2016 Surgery    Right lumpectomy and SLNB: IDC, grade 2, 1.8 cm, margins neg, 3 SLN biopsied, 1 with metastatic disease, 1 with micrometastatic disease    06/07/2016 Miscellaneous    Mammaprint high risk, indicated need for chemo    07/12/2016 - 09/27/2016 Adjuvant Chemotherapy    Doxorubicin and cyclophosphamide x 4 followed by 2 of weekly Paclitaxel stopped due  to reaction, followed by 1 of Abraxane stopped for the same reason.    11/01/2016 - 12/19/2016 Radiation Therapy     1. Right Breast, axilla, SCV: 50 Gy in 25 fractions                          2. Right Breast Boost: 10 Gy in 5 fractions        Current Meds:  Outpatient Encounter Medications as of 10/18/2017  Medication Sig  . anastrozole (ARIMIDEX) 1 MG tablet Take 1 tablet (1 mg total) by mouth daily.  Marland Kitchen gabapentin (NEURONTIN) 300 MG capsule Take 1 capsule (300 mg total) by mouth at bedtime.  Marland Kitchen goserelin (ZOLADEX) 10.8 MG injection Inject 10.8 mg into the skin once.   No facility-administered encounter medications on file as of 10/18/2017.     Allergy:  Allergies  Allergen Reactions  . Paclitaxel Itching  . Amoxicillin Rash    Social Hx:   Social History   Socioeconomic History  . Marital status: Single    Spouse name: Not on file  . Number of children: Not on file  . Years of education: Not on file  . Highest education level: Not on file  Occupational History  . Not on file  Social Needs  . Financial resource strain: Not on file  . Food insecurity:    Worry: Not on file    Inability: Not on file  . Transportation needs:    Medical: Not on file    Non-medical: Not on file  Tobacco Use  . Smoking status: Never Smoker  . Smokeless tobacco: Never Used  Substance and Sexual Activity  . Alcohol use: Yes    Alcohol/week: 1.0 standard drinks    Types: 1 Glasses of wine per week    Comment: once per month  . Drug use: No  . Sexual activity: Not Currently    Partners: Male    Birth control/protection: None  Lifestyle  . Physical activity:    Days per week: Not on file    Minutes per session: Not on file  . Stress: Not on file  Relationships  . Social connections:    Talks on phone: Not on file    Gets together: Not on file    Attends religious service: Not on file    Active member of club or organization: Not on file    Attends meetings of clubs or  organizations: Not on file    Relationship status: Not on file  . Intimate partner violence:    Fear of current or ex partner: Not on file    Emotionally abused: Not on file    Physically abused: Not on file    Forced sexual activity: Not on file  Other Topics Concern  . Not on file  Social History Narrative  . Not on  file    Past Surgical Hx:  Past Surgical History:  Procedure Laterality Date  . BARTHOLIN CYST MARSUPIALIZATION  09/04/2002  . BARTHOLIN GLAND CYST EXCISION Left 07/13/2005  . BREAST LUMPECTOMY WITH RADIOACTIVE SEED AND SENTINEL LYMPH NODE BIOPSY Right 06/07/2016   Procedure: BREAST LUMPECTOMY WITH RADIOACTIVE SEED AND SENTINEL LYMPH NODE BIOPSY;  Surgeon: Alphonsa Overall, MD;  Location: [place];  Service: General;  Laterality: Right;  . HYSTEROSCOPY  2016  . MYOMECTOMY  07/20/2004   Laparotomy  . OVARIAN CYST REMOVAL Right 07/20/2004  . PORTACATH PLACEMENT N/A 07/05/2016   REMOVED Fall 2018 ; Procedure: INSERTION PORT-A-CATH WITH [place];  Surgeon: Alphonsa Overall, MD;  Location: [place];  Service: General;  Laterality: N/A;    Past Medical Hx:  Past Medical History:  Diagnosis Date  . Breast cancer ([place]) 05/2016   right  . Chronic constipation   . Family history of cancer   . Fibroid, uterine   . IBS (irritable bowel syndrome)    IBS - C (constipation)  . Personal history of chemotherapy   . Personal history of radiation therapy     Past Gynecological History:   GYNECOLOGIC HISTORY:  No LMP recorded. (Menstrual status: Chemotherapy). Menarche: 42 years old P 0 LMP January 2019 Contraceptive oral contraceptives greater than 10 years not currently used HRT relative only to her breast cancer treatment  Last Pap now on record from December 2017- for intraepithelial lesion no HPV testing was performed Family Hx:  Family History  Problem Relation Age of Onset  . Breast cancer Mother 65  . Pancreatic cancer Paternal Grandmother    . Prostate cancer Paternal Grandfather   . Ovarian cancer Other        MGMs sister  . Breast cancer Other        mother's maternal first cousin    Review of Systems: Review of Systems  Constitutional: Positive for unexpected weight change.  Gastrointestinal: Positive for abdominal distention and constipation.  Endocrine: Positive for hot flashes.  Musculoskeletal: Positive for back pain.  Skin: Positive for itching.  All other systems reviewed and are negative.    Vitals:  Blood pressure 118/65, pulse 68, temperature 97.7 F (36.5 C), temperature source Oral, resp. rate 20, height '5\' 4"'  (1.626 m), weight 163 lb 4.8 oz (74.1 kg), SpO2 100 %. Body mass index is 28.03 kg/m.   Physical Exam:  ECOG PERFORMANCE STATUS: 1 - Symptomatic but completely ambulatory   General :  Well developed, 42 y.o., female in no apparent distress HEENT:  Normocephalic/atraumatic, symmetric, EOMI, eyelids normal Neck:   No visible masses.  Respiratory:  Respirations unlabored, no use of accessory muscles CV:   Deferred Breast:  Deferred Musculoskeletal: Normal muscle strength. Abdomen:  No visible masses or protrusion Extremities:  No visible edema or deformities Skin:   Normal inspection Neuro/Psych:  No focal motor deficit, no abnormal mental status. Normal gait. Normal affect. Alert and oriented to person, place, and time   Genito Urinary: From 07/10/2017 visit Vulva: Normal external female genitalia. Slight defect ~3:00 introitus due to Bartholins surgery. Bladder/urethra: Urethral meatus normal in size and location. No lesions or   masses, well supported bladder Speculum exam: Vagina: +thick white discharge No lesion, no bleeding. Cervix: Normal appearing, no lesions. Bimanual exam:  Uterus: difficult to determine given probable fibroids, mobile.  Adnexa: +mass in culdesac, posterior to uterus and low.  Rectovaginal:  Good tone, + impingement from mass that feels to be attached to  uterus. No cul de sac nodularity, no parametrial involvement or nodularity.   Oncologic Summary: 1. Right breast cancer, hormone receptor positive a. S/p surgery, chemo, radiation b. Currently on ovarian suppression/anastrozole 2. Fibroid uterus  Assessment/Plan: 1. Fibroid uterus o We reviewed her MRI images and report on her last visit. o We previously reviewed the very low possibility of sarcomas however the patient is not having symptoms that make me concerned at this point o I suspect given the size of the fibroids this may be contributing to her intermittent constipation and feeling of "bloating" 2. Recurrence risk for breast cancer +/- BSO  o Defer to Dr. Jana Hakim  3. Procedure discussion o Risks of early menopause addressed including increased risk of osteoporosis, possibly heart disease. o Given the size/location of the fibroids (I get the width at about 8cm in the mid fundus) and her prior history of myomectomy I think open approach is indicated. o We discussed the recovery time  o The risks, benefits, and alternatives were reviewed and the surgical sketch presented. i. She has had myomectomy and ovarian cystectomy in the past. Her adhesive disease and the size / location of the fibroids will determine her intraoperative injury risks which likely are higher than the average patient (for urinary tract and bowel injury). ii. She was given a copy of the sketch today. iii. Other risks to consider have been reviewed in detail including early menopause/bone loss/vaginal dryness/infertility 4. After discussing timing and specifically complications that can arise postop she is leaning towards surgery after she starts her job, maybe in December so we will block now since that can fill up with end-of-year deductible issues. 5. She will return to see me 10-14 days postop  Face to face time with patient was 15 minutes. Over 50% of this time was spent on counseling and coordination of  care.  Isabel Caprice, MD  11/05/2017, 10:57 AM  Cc: Raiford Simmonds, MD Lurline Del, MD

## 2017-11-15 ENCOUNTER — Inpatient Hospital Stay: Payer: Medicaid Other | Attending: Adult Health

## 2017-11-15 ENCOUNTER — Other Ambulatory Visit: Payer: Self-pay | Admitting: *Deleted

## 2017-11-15 VITALS — BP 133/73 | HR 93 | Temp 98.2°F | Resp 18

## 2017-11-15 DIAGNOSIS — Z5111 Encounter for antineoplastic chemotherapy: Secondary | ICD-10-CM | POA: Diagnosis not present

## 2017-11-15 DIAGNOSIS — Z170 Estrogen receptor positive status [ER+]: Secondary | ICD-10-CM | POA: Diagnosis not present

## 2017-11-15 DIAGNOSIS — Z9221 Personal history of antineoplastic chemotherapy: Secondary | ICD-10-CM | POA: Insufficient documentation

## 2017-11-15 DIAGNOSIS — C50211 Malignant neoplasm of upper-inner quadrant of right female breast: Secondary | ICD-10-CM | POA: Diagnosis present

## 2017-11-15 DIAGNOSIS — Z923 Personal history of irradiation: Secondary | ICD-10-CM | POA: Diagnosis not present

## 2017-11-15 DIAGNOSIS — Z79811 Long term (current) use of aromatase inhibitors: Secondary | ICD-10-CM | POA: Diagnosis not present

## 2017-11-15 DIAGNOSIS — Z95828 Presence of other vascular implants and grafts: Secondary | ICD-10-CM

## 2017-11-15 DIAGNOSIS — D259 Leiomyoma of uterus, unspecified: Secondary | ICD-10-CM | POA: Insufficient documentation

## 2017-11-15 MED ORDER — ANASTROZOLE 1 MG PO TABS: 1.0000 mg | ORAL_TABLET | Freq: Every day | ORAL | 2 refills | Status: DC

## 2017-11-15 MED ORDER — GOSERELIN ACETATE 3.6 MG ~~LOC~~ IMPL
DRUG_IMPLANT | SUBCUTANEOUS | Status: AC
  Filled 2017-11-15: qty 3.6

## 2017-11-15 MED ORDER — GOSERELIN ACETATE 3.6 MG ~~LOC~~ IMPL
3.6000 mg | DRUG_IMPLANT | SUBCUTANEOUS | Status: DC
  Administered 2017-11-15: 3.6 mg via SUBCUTANEOUS

## 2017-11-15 NOTE — Patient Instructions (Signed)
Goserelin injection What is this medicine? GOSERELIN (GOE se rel in) is similar to a hormone found in the body. It lowers the amount of sex hormones that the body makes. Men will have lower testosterone levels and women will have lower estrogen levels while taking this medicine. In men, this medicine is used to treat prostate cancer; the injection is either given once per month or once every 12 weeks. A once per month injection (only) is used to treat women with endometriosis, dysfunctional uterine bleeding, or advanced breast cancer. This medicine may be used for other purposes; ask your health care provider or pharmacist if you have questions. COMMON BRAND NAME(S): Zoladex What should I tell my health care provider before I take this medicine? They need to know if you have any of these conditions (some only apply to women): -diabetes -heart disease or previous heart attack -high blood pressure -high cholesterol -kidney disease -osteoporosis or low bone density -problems passing urine -spinal cord injury -stroke -tobacco smoker -an unusual or allergic reaction to goserelin, hormone therapy, other medicines, foods, dyes, or preservatives -pregnant or trying to get pregnant -breast-feeding How should I use this medicine? This medicine is for injection under the skin. It is given by a health care professional in a hospital or clinic setting. Men receive this injection once every 4 weeks or once every 12 weeks. Women will only receive the once every 4 weeks injection. Talk to your pediatrician regarding the use of this medicine in children. Special care may be needed. Overdosage: If you think you have taken too much of this medicine contact a poison control center or emergency room at once. NOTE: This medicine is only for you. Do not share this medicine with others. What if I miss a dose? It is important not to miss your dose. Call your doctor or health care professional if you are unable to  keep an appointment. What may interact with this medicine? -female hormones like estrogen -herbal or dietary supplements like black cohosh, chasteberry, or DHEA -female hormones like testosterone -prasterone This list may not describe all possible interactions. Give your health care provider a list of all the medicines, herbs, non-prescription drugs, or dietary supplements you use. Also tell them if you smoke, drink alcohol, or use illegal drugs. Some items may interact with your medicine. What should I watch for while using this medicine? Visit your doctor or health care professional for regular checks on your progress. Your symptoms may appear to get worse during the first weeks of this therapy. Tell your doctor or healthcare professional if your symptoms do not start to get better or if they get worse after this time. Your bones may get weaker if you take this medicine for a long time. If you smoke or frequently drink alcohol you may increase your risk of bone loss. A family history of osteoporosis, chronic use of drugs for seizures (convulsions), or corticosteroids can also increase your risk of bone loss. Talk to your doctor about how to keep your bones strong. This medicine should stop regular monthly menstration in women. Tell your doctor if you continue to menstrate. Women should not become pregnant while taking this medicine or for 12 weeks after stopping this medicine. Women should inform their doctor if they wish to become pregnant or think they might be pregnant. There is a potential for serious side effects to an unborn child. Talk to your health care professional or pharmacist for more information. Do not breast-feed an infant while taking   this medicine. Men should inform their doctors if they wish to father a child. This medicine may lower sperm counts. Talk to your health care professional or pharmacist for more information. What side effects may I notice from receiving this  medicine? Side effects that you should report to your doctor or health care professional as soon as possible: -allergic reactions like skin rash, itching or hives, swelling of the face, lips, or tongue -bone pain -breathing problems -changes in vision -chest pain -feeling faint or lightheaded, falls -fever, chills -pain, swelling, warmth in the leg -pain, tingling, numbness in the hands or feet -signs and symptoms of low blood pressure like dizziness; feeling faint or lightheaded, falls; unusually weak or tired -stomach pain -swelling of the ankles, feet, hands -trouble passing urine or change in the amount of urine -unusually high or low blood pressure -unusually weak or tired Side effects that usually do not require medical attention (report to your doctor or health care professional if they continue or are bothersome): -change in sex drive or performance -changes in breast size in both males and females -changes in emotions or moods -headache -hot flashes -irritation at site where injected -loss of appetite -skin problems like acne, dry skin -vaginal dryness This list may not describe all possible side effects. Call your doctor for medical advice about side effects. You may report side effects to FDA at 1-800-FDA-1088. Where should I keep my medicine? This drug is given in a hospital or clinic and will not be stored at home. NOTE: This sheet is a summary. It may not cover all possible information. If you have questions about this medicine, talk to your doctor, pharmacist, or health care provider.  2018 Elsevier/Gold Standard (2013-05-21 11:10:35)  

## 2017-11-15 NOTE — Progress Notes (Signed)
Pt. Tolerated injection well, No further problems or concerns noted. 

## 2017-12-13 ENCOUNTER — Inpatient Hospital Stay: Payer: Medicaid Other | Attending: Adult Health

## 2017-12-13 DIAGNOSIS — Z95828 Presence of other vascular implants and grafts: Secondary | ICD-10-CM

## 2017-12-13 DIAGNOSIS — C50211 Malignant neoplasm of upper-inner quadrant of right female breast: Secondary | ICD-10-CM | POA: Diagnosis not present

## 2017-12-13 DIAGNOSIS — Z170 Estrogen receptor positive status [ER+]: Secondary | ICD-10-CM

## 2017-12-13 MED ORDER — GOSERELIN ACETATE 3.6 MG ~~LOC~~ IMPL
3.6000 mg | DRUG_IMPLANT | SUBCUTANEOUS | Status: DC
  Administered 2017-12-13: 3.6 mg via SUBCUTANEOUS

## 2017-12-13 MED ORDER — GOSERELIN ACETATE 3.6 MG ~~LOC~~ IMPL
DRUG_IMPLANT | SUBCUTANEOUS | Status: AC
  Filled 2017-12-13: qty 3.6

## 2017-12-13 NOTE — Patient Instructions (Signed)
Goserelin injection What is this medicine? GOSERELIN (GOE se rel in) is similar to a hormone found in the body. It lowers the amount of sex hormones that the body makes. Men will have lower testosterone levels and women will have lower estrogen levels while taking this medicine. In men, this medicine is used to treat prostate cancer; the injection is either given once per month or once every 12 weeks. A once per month injection (only) is used to treat women with endometriosis, dysfunctional uterine bleeding, or advanced breast cancer. This medicine may be used for other purposes; ask your health care provider or pharmacist if you have questions. COMMON BRAND NAME(S): Zoladex What should I tell my health care provider before I take this medicine? They need to know if you have any of these conditions (some only apply to women): -diabetes -heart disease or previous heart attack -high blood pressure -high cholesterol -kidney disease -osteoporosis or low bone density -problems passing urine -spinal cord injury -stroke -tobacco smoker -an unusual or allergic reaction to goserelin, hormone therapy, other medicines, foods, dyes, or preservatives -pregnant or trying to get pregnant -breast-feeding How should I use this medicine? This medicine is for injection under the skin. It is given by a health care professional in a hospital or clinic setting. Men receive this injection once every 4 weeks or once every 12 weeks. Women will only receive the once every 4 weeks injection. Talk to your pediatrician regarding the use of this medicine in children. Special care may be needed. Overdosage: If you think you have taken too much of this medicine contact a poison control center or emergency room at once. NOTE: This medicine is only for you. Do not share this medicine with others. What if I miss a dose? It is important not to miss your dose. Call your doctor or health care professional if you are unable to  keep an appointment. What may interact with this medicine? -female hormones like estrogen -herbal or dietary supplements like black cohosh, chasteberry, or DHEA -female hormones like testosterone -prasterone This list may not describe all possible interactions. Give your health care provider a list of all the medicines, herbs, non-prescription drugs, or dietary supplements you use. Also tell them if you smoke, drink alcohol, or use illegal drugs. Some items may interact with your medicine. What should I watch for while using this medicine? Visit your doctor or health care professional for regular checks on your progress. Your symptoms may appear to get worse during the first weeks of this therapy. Tell your doctor or healthcare professional if your symptoms do not start to get better or if they get worse after this time. Your bones may get weaker if you take this medicine for a long time. If you smoke or frequently drink alcohol you may increase your risk of bone loss. A family history of osteoporosis, chronic use of drugs for seizures (convulsions), or corticosteroids can also increase your risk of bone loss. Talk to your doctor about how to keep your bones strong. This medicine should stop regular monthly menstration in women. Tell your doctor if you continue to menstrate. Women should not become pregnant while taking this medicine or for 12 weeks after stopping this medicine. Women should inform their doctor if they wish to become pregnant or think they might be pregnant. There is a potential for serious side effects to an unborn child. Talk to your health care professional or pharmacist for more information. Do not breast-feed an infant while taking   this medicine. Men should inform their doctors if they wish to father a child. This medicine may lower sperm counts. Talk to your health care professional or pharmacist for more information. What side effects may I notice from receiving this  medicine? Side effects that you should report to your doctor or health care professional as soon as possible: -allergic reactions like skin rash, itching or hives, swelling of the face, lips, or tongue -bone pain -breathing problems -changes in vision -chest pain -feeling faint or lightheaded, falls -fever, chills -pain, swelling, warmth in the leg -pain, tingling, numbness in the hands or feet -signs and symptoms of low blood pressure like dizziness; feeling faint or lightheaded, falls; unusually weak or tired -stomach pain -swelling of the ankles, feet, hands -trouble passing urine or change in the amount of urine -unusually high or low blood pressure -unusually weak or tired Side effects that usually do not require medical attention (report to your doctor or health care professional if they continue or are bothersome): -change in sex drive or performance -changes in breast size in both males and females -changes in emotions or moods -headache -hot flashes -irritation at site where injected -loss of appetite -skin problems like acne, dry skin -vaginal dryness This list may not describe all possible side effects. Call your doctor for medical advice about side effects. You may report side effects to FDA at 1-800-FDA-1088. Where should I keep my medicine? This drug is given in a hospital or clinic and will not be stored at home. NOTE: This sheet is a summary. It may not cover all possible information. If you have questions about this medicine, talk to your doctor, pharmacist, or health care provider.  2018 Elsevier/Gold Standard (2013-05-21 11:10:35)  

## 2017-12-29 ENCOUNTER — Telehealth: Payer: Self-pay | Admitting: Gynecologic Oncology

## 2017-12-29 NOTE — Telephone Encounter (Signed)
Patient called this am stating she would like to get her surgery done as soon as possible.  She was originally scheduled for December due to work conflict but states she wants to proceed now.  Advised we would discuss with Dr. Gerarda Fraction and there should not be a problem to move her case up.    Returned call to patient and advised her that we are moving her case up to October 10.  Patient very appreciative.  Advised to call for any needs.

## 2018-01-01 ENCOUNTER — Inpatient Hospital Stay: Payer: Medicaid Other | Attending: Adult Health | Admitting: Obstetrics

## 2018-01-01 ENCOUNTER — Encounter: Payer: Self-pay | Admitting: Obstetrics

## 2018-01-01 VITALS — BP 119/67 | HR 54 | Temp 98.7°F | Resp 18 | Ht 64.0 in | Wt 163.2 lb

## 2018-01-01 DIAGNOSIS — Z90710 Acquired absence of both cervix and uterus: Secondary | ICD-10-CM | POA: Insufficient documentation

## 2018-01-01 DIAGNOSIS — C50911 Malignant neoplasm of unspecified site of right female breast: Secondary | ICD-10-CM

## 2018-01-01 DIAGNOSIS — Z170 Estrogen receptor positive status [ER+]: Secondary | ICD-10-CM | POA: Diagnosis not present

## 2018-01-01 DIAGNOSIS — Z191 Hormone sensitive malignancy status: Secondary | ICD-10-CM | POA: Insufficient documentation

## 2018-01-01 DIAGNOSIS — D251 Intramural leiomyoma of uterus: Secondary | ICD-10-CM | POA: Diagnosis not present

## 2018-01-01 DIAGNOSIS — Z79811 Long term (current) use of aromatase inhibitors: Secondary | ICD-10-CM | POA: Diagnosis not present

## 2018-01-01 DIAGNOSIS — Z90722 Acquired absence of ovaries, bilateral: Secondary | ICD-10-CM | POA: Insufficient documentation

## 2018-01-01 NOTE — Progress Notes (Signed)
[place] at [facility]    Progress Note: Gyn-Onc Established Patient FOLLOW-UP  Consult was originally requested by Dr. Jana Hakim for the evaluation of Candice [name] 42 y.o. female  CC:  Chief Complaint  Patient presents with  . Estrogen receptor positive  negative BRCA  HPI: Candice [name]  is a very nice 42 y.o.  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  This was 95% estrogen receptor positive; 100% progesterone receptor positive.  Treatment for her breast cancer including surgery chemotherapy and radiation.  She states she completed all treatment September 2018.  She was on tamoxifen for some time which was then switched to anastrozole she states after she had a menstrual cycle in January 2019.  Her ovaries are suppressed currently by monthly goserelin.  Referred for discussion of BSO for permanent surgical hormonal suppression.   She admits to a past history of a fibroid uterus and myomectomy.  [facility] My Risk genetics panel 04/27/16 and that found no deleterious mutations.  Given her history of fibroids and based on clinical exam I ordered a transvaginal ultrasound which is noted below.  In summary the uterus was seen to be heterogeneous with multiple leiomyoma.  There was a simple appearing 2.8 cm right ovarian cyst left adnexa was normal. In planning for surgical considerations (ie hysterectomy if she desires) I recommended we get an MRI to look at the dimensions/locations of the fibroids to determine if laparoscopy may be reasonable.  MRI revealed - Uterus: Measures 8.6 x 7.3 x 8.0 cm (volume = 260 cm^3). Numerous small intramural fibroids are seen which involve the uterus diffusely. The largest intramural fibroid is located in the anterior corpus and measures 3.0 cm in maximum diameter.  A subserosal fibroid is seen arising from the posterior lower uterine segment, which measures 3.7 cm  in maximum diameter. The largest fibroid is a pedunculated fibroid arising from the left posterior corpus, which measures 6.9 cm in maximum diameter. -- Intracavitary fibroids:  None. -- Pedunculated fibroids: 6.9 cm pedunculated fibroid arising from the left posterior corpus, as described above. The soft tissue pedicle of attachment to the uterus measures approximately 2.3 cm in thickness    Interval history:  She is looking to move to surgery sooner than planned and returns today to confirm plans. No new complaints. Her sister accompanies her today.  Measurement of disease: Marland Kitchen Defer to Medical Oncology   Radiology (relevant to GYN visit)  04/2004  ULTRASOUND OF THE PELVIS:  Transabdominal and transvaginal ultrasound of the pelvis were performed. The uterus is prominent measuring 12.2 cm sagittally with a depth of 10.5 cm and width of 8.6 cm. The uterus is retroflexed. The endometrium measures 8.8 mm in thickness. However there is a large fibroid emanating from the posterior aspect of the upper midbody of the uterus measuring 8.2 x 8.5 x 9.1 cm. Complex cyst is noted emanating from the right ovary measuring 4.2 x 2.9 x 3.4 cm. With the diffuse internal echogenicity of this cyst this may represent hemorrhagic cyst or possibly endometrioma in the proper clinical setting. Followup ultrasound in two months is recommended to assess this area further. The right ovary measures 5.1 x 3.8 x 4.2 cm. The left ovary measures 3.9 x 1.8 x 2.9 cm with small subcentimeter follicular cysts present. Only a small amount of free fluid is noted.   IMPRESSION:  1. Large fibroid emanates from the posterior upper  body of the uterus measuring 8.2 x 9.5 x 9.1 cm.   2. Smaller fibroid near the fundus of 2.4 x 1.8 x 3.1 cm.   3. Complex cyst in the right ovary of 4.2 x 2.9 x 3.4 cm. See above.  07/14/2017-transabdominal transvaginal ultrasound the pelvis-compared to 2006-impression is multiple uterine leiomyoma largest 5.6 cm.   Uterus measures 7.5 x 7.8 x 8.9 cm there is an exophytic left fundal lesion 5.4 x 3.3 x 6.9 cm.  A mid right leiomyoma 3 x 3.1 x 2.8 cm.  A left fundal leiomyoma 5.6 x 2.8 x 4.9 cm.  Also noted is a right ovary 5.3 x 2.6 x 1.8 with a simple appearing cyst 2.8 x 2.7 x 2.2 cm.  09/07/17 - MRI pelvis - FINDINGS: Urinary Tract: No bladder or urethral abnormality identified. Bowel:  Unremarkable visualized pelvic bowel loops. Vascular/Lymphatic: No pathologically enlarged lymph nodes or other significant abnormality.  Reproductive: -- Uterus: Measures 8.6 x 7.3 x 8.0 cm (volume = 260 cm^3). Numerous small intramural fibroids are seen which involve the uterus diffusely. The largest intramural fibroid is located in the anterior corpus and measures 3.0 cm in maximum diameter.  A subserosal fibroid is seen arising from the posterior lower uterine segment, which measures 3.7 cm in maximum diameter. The largest fibroid is a pedunculated fibroid arising from the left posterior corpus, which measures 6.9 cm in maximum diameter. -- Intracavitary fibroids:  None. -- Pedunculated fibroids: 6.9 cm pedunculated fibroid arising from the left posterior corpus, as described above. The soft tissue pedicle of attachment to the uterus measures approximately 2.3 cm in thickness on image 15/4. -- Fibroid contrast enhancement: All fibroids show contrast enhancement, without significant degeneration/devascularization. -- Right ovary:  Appears normal.  No mass identified. -- Left ovary:  Appears normal.  No mass identified. Other: Trace amount of free fluid in pelvic cul-de-sac is within a physiologic range.  Musculoskeletal:  Unremarkable. IMPRESSION: Diffuse uterine involvement by fibroids. Largest fibroid is pedunculated, arising from the left posterior corpus, measuring 6.9 cm in maximum diameter. Normal appearance of both ovaries    Oncologic History:      Malignant neoplasm of upper-inner quadrant of right breast in female,  estrogen receptor positive ([place])   04/21/2016 Initial Biopsy    Right breast upper inner quadrant biopsy: IDC, grade 2, ER/PR positive, Her-2 negative, Ki-67 20%.    04/27/2016 Initial Diagnosis    Malignant neoplasm of upper-inner quadrant of right breast in female, estrogen receptor positive ([place])    04/27/2016 -  Anti-estrogen oral therapy    Tamoxifen started, then stopped at start of chemotherapy and resumed in 12/26/2016, Goserelin started on 05/01/2017, to change to Anastrozole after 2-3 doses of Goserelin    05/09/2016 Genetic Testing    Patient has genetic testing done for personal and family history of breast cancer. Negative genetic testing on the [facility] panel.  The [facility] gene panel offered by [employer] includes sequencing and deletion/duplication testing of the following 28 genes: APC, ATM, BARD1, BMPR1A, BRCA1, BRCA2, BRIP1, CHD1, CDK4, CDKN2A, CHEK2, EPCAM (large rearrangement only), MLH1, MSH2, MSH6, MUTYH, NBN, PALB2, PMS2, PTEN, RAD51C, RAD51D, SMAD4, STK11, and TP53. Sequencing was performed for select regions of POLE and POLD1, and large rearrangement analysis was performed for select regions of GREM1.     06/07/2016 Surgery    Right lumpectomy and SLNB: IDC, grade 2, 1.8 cm, margins neg, 3 SLN biopsied, 1 with metastatic disease, 1 with micrometastatic disease  06/07/2016 Miscellaneous    Mammaprint high risk, indicated need for chemo    07/12/2016 - 09/27/2016 Adjuvant Chemotherapy    Doxorubicin and cyclophosphamide x 4 followed by 2 of weekly Paclitaxel stopped due to reaction, followed by 1 of Abraxane stopped for the same reason.    11/01/2016 - 12/19/2016 Radiation Therapy     1. Right Breast, axilla, SCV: 50 Gy in 25 fractions                          2. Right Breast Boost: 10 Gy in 5 fractions        Current Meds:  Outpatient Encounter Medications as of 01/01/2018  Medication Sig  . anastrozole (ARIMIDEX) 1 MG tablet Take 1 tablet (1 mg  total) by mouth daily.  Marland Kitchen goserelin (ZOLADEX) 10.8 MG injection Inject 10.8 mg into the skin every 30 (thirty) days.   . Multiple Vitamins-Minerals (HAIR SKIN AND NAILS FORMULA) TABS Take 1 tablet by mouth 2 (two) times daily.  Marland Kitchen gabapentin (NEURONTIN) 300 MG capsule Take 1 capsule (300 mg total) by mouth at bedtime. (Patient not taking: Reported on 01/01/2018)  . naproxen (NAPROSYN) 500 MG tablet Take 500 mg by mouth 2 (two) times daily as needed for pain.   No facility-administered encounter medications on file as of 01/01/2018.     Allergy:  Allergies  Allergen Reactions  . Paclitaxel Itching  . Amoxicillin Rash    Has patient had a PCN reaction causing immediate rash, facial/tongue/throat swelling, SOB or lightheadedness with hypotension: Yes Has patient had a PCN reaction causing severe rash involving mucus membranes or skin necrosis: Yes Has patient had a PCN reaction that required hospitalization: Yes Has patient had a PCN reaction occurring within the last 10 years: No If all of the above answers are "NO", then may proceed with Cephalosporin use.     Social Hx:   Social History   Socioeconomic History  . Marital status: Single    Spouse name: Not on file  . Number of children: Not on file  . Years of education: Not on file  . Highest education level: Not on file  Occupational History  . Not on file  Social Needs  . Financial resource strain: Not on file  . Food insecurity:    Worry: Not on file    Inability: Not on file  . Transportation needs:    Medical: Not on file    Non-medical: Not on file  Tobacco Use  . Smoking status: Never Smoker  . Smokeless tobacco: Never Used  Substance and Sexual Activity  . Alcohol use: Yes    Alcohol/week: 1.0 standard drinks    Types: 1 Glasses of wine per week    Comment: once per month  . Drug use: No  . Sexual activity: Not Currently    Partners: Male    Birth control/protection: None  Lifestyle  . Physical activity:     Days per week: Not on file    Minutes per session: Not on file  . Stress: Not on file  Relationships  . Social connections:    Talks on phone: Not on file    Gets together: Not on file    Attends religious service: Not on file    Active member of club or organization: Not on file    Attends meetings of clubs or organizations: Not on file    Relationship status: Not on file  . Intimate partner violence:  Fear of current or ex partner: Not on file    Emotionally abused: Not on file    Physically abused: Not on file    Forced sexual activity: Not on file  Other Topics Concern  . Not on file  Social History Narrative  . Not on file    Past Surgical Hx:  Past Surgical History:  Procedure Laterality Date  . BARTHOLIN CYST MARSUPIALIZATION  09/04/2002  . BARTHOLIN GLAND CYST EXCISION Left 07/13/2005  . BREAST LUMPECTOMY WITH RADIOACTIVE SEED AND SENTINEL LYMPH NODE BIOPSY Right 06/07/2016   Procedure: BREAST LUMPECTOMY WITH RADIOACTIVE SEED AND SENTINEL LYMPH NODE BIOPSY;  Surgeon: Alphonsa Overall, MD;  Location: [facility];  Service: General;  Laterality: Right;  . HYSTEROSCOPY  2016  . MYOMECTOMY  07/20/2004   Laparotomy  . OVARIAN CYST REMOVAL Right 07/20/2004  . PORTACATH PLACEMENT N/A 07/05/2016   REMOVED Fall 2018 ; Procedure: INSERTION PORT-A-CATH WITH [place];  Surgeon: Alphonsa Overall, MD;  Location: [place];  Service: General;  Laterality: N/A;    Past Medical Hx:  Past Medical History:  Diagnosis Date  . Breast cancer ([place]) 05/2016   right  . Chronic constipation   . Family history of cancer   . Fibroid, uterine   . IBS (irritable bowel syndrome)    IBS - C (constipation)  . Personal history of chemotherapy   . Personal history of radiation therapy     Past Gynecological History:   GYNECOLOGIC HISTORY:  No LMP recorded. (Menstrual status: Chemotherapy). Menarche: 42 years old P 0 LMP January 2019 Contraceptive oral contraceptives  greater than 10 years not currently used HRT relative only to her breast cancer treatment  Last Pap now on record from December 2017- for intraepithelial lesion no HPV testing was performed  Family Hx:  Family History  Problem Relation Age of Onset  . Breast cancer Mother 53  . Pancreatic cancer Paternal Grandmother   . Prostate cancer Paternal Grandfather   . Ovarian cancer Other        MGMs sister  . Breast cancer Other        mother's maternal first cousin    Review of Systems: Review of Systems  Gastrointestinal: Positive for abdominal distention and constipation.  Musculoskeletal: Positive for back pain.  All other systems reviewed and are negative.    Vitals:  Blood pressure 119/67, pulse (!) 54, temperature 98.7 F (37.1 C), temperature source Oral, resp. rate 18, height _0  (1.626 m), weight 163 lb 4 oz (74 kg), SpO2 100 %. Body mass index is 28.02 kg/m.   Physical Exam:  ECOG PERFORMANCE STATUS: 1 - Symptomatic but completely ambulatory   General :  Well developed, 42 y.o., female in no apparent distress HEENT:  Normocephalic/atraumatic, symmetric, EOMI, eyelids normal Neck:   No visible masses.  Respiratory:  Respirations unlabored, no use of accessory muscles CV:   Deferred Breast:  Deferred Musculoskeletal: Normal muscle strength. Abdomen:  No visible masses or protrusion Extremities:  No visible edema or deformities Skin:   Normal inspection Neuro/Psych:  No focal motor deficit, no abnormal mental status. Normal gait. Normal affect. Alert and oriented to person, place, and time   Genito Urinary: From 07/10/2017 visit Vulva: Normal external female genitalia. Slight defect ~3:00 introitus due to Bartholins surgery. Bladder/urethra: Urethral meatus normal in size and location. No lesions or   masses, well supported bladder Speculum exam: Vagina: +thick white discharge No lesion, no bleeding. Cervix: Normal appearing, no lesions. Bimanual  exam:  Uterus:  difficult to determine given probable fibroids, mobile.  Adnexa: +mass in culdesac, posterior to uterus and low.  Rectovaginal:  Good tone, + impingement from mass that feels to be attached to uterus. No cul de sac nodularity, no parametrial involvement or nodularity.   Oncologic Summary: 1. Right breast cancer, hormone receptor positive a. S/p surgery, chemo, radiation b. Currently on ovarian suppression/anastrozole 2. Fibroid uterus  Assessment/Plan: 1. Fibroid uterus o Previously we reviewed her MRI and ultrasound images  i. I looked at those personally again today o We previously reviewed the very low possibility of sarcomas however the patient is not having symptoms that make me concerned at this point o I suspect given the size of the fibroids this may be contributing to her intermittent constipation and feeling of "bloating" 2. Recurrence risk for breast cancer +/- BSO  o Defer to Dr. Jana Hakim  3. Procedure discussion o Risks of early menopause addressed including increased risk of osteoporosis, possibly heart disease have been discussed on multiple occasions.  o Given the size/location of the fibroids (I get the width at about 8cm in the mid fundus) and her prior history of myomectomy I think open approach is likely o However we will request robotic setup and at least do a diagnostic scope to determine feasibility of MIS i. She understands open approach is highly likely and even if MIS feasible she will need a minilap for delivery of the uterus. o We discussed the recovery time in both situations o The risks, benefits, and alternatives were reviewed and the surgical sketch presented on prior visits. i. She has had myomectomy and ovarian cystectomy in the past. Her adhesive disease and the size / location of the fibroids will determine her intraoperative injury risks which likely are higher than the average patient (for urinary tract and bowel injury). ii. She was given a copy of the  sketch last visit 4. She will return to see me 10-14 days postop  Face to face time with patient was 15 minutes. Over 50% of this time was spent on counseling and coordination of care.  Isabel Caprice, MD  01/01/2018, 6:05 PM  Cc: Raiford Simmonds, MD (PCP) Lurline Del, MD (Medical Oncology)

## 2018-01-01 NOTE — H&P (View-Only) (Signed)
[place] at [facility]    Progress Note: Gyn-Onc Established Patient FOLLOW-UP  Consult was originally requested by Dr. Jana Hakim for the evaluation of Candice [name] 42 y.o. female  CC:  Chief Complaint  Patient presents with  . Estrogen receptor positive  negative BRCA  HPI: [name]  is a very nice 42 y.o.  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  This was 95% estrogen receptor positive; 100% progesterone receptor positive.  Treatment for her breast cancer including surgery chemotherapy and radiation.  She states she completed all treatment September 2018.  She was on tamoxifen for some time which was then switched to anastrozole she states after she had a menstrual cycle in January 2019.  Her ovaries are suppressed currently by monthly goserelin.  Referred for discussion of BSO for permanent surgical hormonal suppression.   She admits to a past history of a fibroid uterus and myomectomy.  [facility] My Risk genetics panel 04/27/16 and that found no deleterious mutations.  Given her history of fibroids and based on clinical exam I ordered a transvaginal ultrasound which is noted below.  In summary the uterus was seen to be heterogeneous with multiple leiomyoma.  There was a simple appearing 2.8 cm right ovarian cyst left adnexa was normal. In planning for surgical considerations (ie hysterectomy if she desires) I recommended we get an MRI to look at the dimensions/locations of the fibroids to determine if laparoscopy may be reasonable.  MRI revealed - Uterus: Measures 8.6 x 7.3 x 8.0 cm (volume = 260 cm^3). Numerous small intramural fibroids are seen which involve the uterus diffusely. The largest intramural fibroid is located in the anterior corpus and measures 3.0 cm in maximum diameter.  A subserosal fibroid is seen arising from the posterior lower uterine segment, which measures 3.7 cm  in maximum diameter. The largest fibroid is a pedunculated fibroid arising from the left posterior corpus, which measures 6.9 cm in maximum diameter. -- Intracavitary fibroids:  None. -- Pedunculated fibroids: 6.9 cm pedunculated fibroid arising from the left posterior corpus, as described above. The soft tissue pedicle of attachment to the uterus measures approximately 2.3 cm in thickness    Interval history:  She is looking to move to surgery sooner than planned and returns today to confirm plans. No new complaints. Her sister accompanies her today.  Measurement of disease: Marland Kitchen Defer to Medical Oncology   Radiology (relevant to GYN visit)  04/2004  ULTRASOUND OF THE PELVIS:  Transabdominal and transvaginal ultrasound of the pelvis were performed. The uterus is prominent measuring 12.2 cm sagittally with a depth of 10.5 cm and width of 8.6 cm. The uterus is retroflexed. The endometrium measures 8.8 mm in thickness. However there is a large fibroid emanating from the posterior aspect of the upper midbody of the uterus measuring 8.2 x 8.5 x 9.1 cm. Complex cyst is noted emanating from the right ovary measuring 4.2 x 2.9 x 3.4 cm. With the diffuse internal echogenicity of this cyst this may represent hemorrhagic cyst or possibly endometrioma in the proper clinical setting. Followup ultrasound in two months is recommended to assess this area further. The right ovary measures 5.1 x 3.8 x 4.2 cm. The left ovary measures 3.9 x 1.8 x 2.9 cm with small subcentimeter follicular cysts present. Only a small amount of free fluid is noted.   IMPRESSION:  1. Large fibroid emanates from the posterior upper  body of the uterus measuring 8.2 x 9.5 x 9.1 cm.   2. Smaller fibroid near the fundus of 2.4 x 1.8 x 3.1 cm.   3. Complex cyst in the right ovary of 4.2 x 2.9 x 3.4 cm. See above.  07/14/2017-transabdominal transvaginal ultrasound the pelvis-compared to 2006-impression is multiple uterine leiomyoma largest 5.6 cm.   Uterus measures 7.5 x 7.8 x 8.9 cm there is an exophytic left fundal lesion 5.4 x 3.3 x 6.9 cm.  A mid right leiomyoma 3 x 3.1 x 2.8 cm.  A left fundal leiomyoma 5.6 x 2.8 x 4.9 cm.  Also noted is a right ovary 5.3 x 2.6 x 1.8 with a simple appearing cyst 2.8 x 2.7 x 2.2 cm.  09/07/17 - MRI pelvis - FINDINGS: Urinary Tract: No bladder or urethral abnormality identified. Bowel:  Unremarkable visualized pelvic bowel loops. Vascular/Lymphatic: No pathologically enlarged lymph nodes or other significant abnormality.  Reproductive: -- Uterus: Measures 8.6 x 7.3 x 8.0 cm (volume = 260 cm^3). Numerous small intramural fibroids are seen which involve the uterus diffusely. The largest intramural fibroid is located in the anterior corpus and measures 3.0 cm in maximum diameter.  A subserosal fibroid is seen arising from the posterior lower uterine segment, which measures 3.7 cm in maximum diameter. The largest fibroid is a pedunculated fibroid arising from the left posterior corpus, which measures 6.9 cm in maximum diameter. -- Intracavitary fibroids:  None. -- Pedunculated fibroids: 6.9 cm pedunculated fibroid arising from the left posterior corpus, as described above. The soft tissue pedicle of attachment to the uterus measures approximately 2.3 cm in thickness on image 15/4. -- Fibroid contrast enhancement: All fibroids show contrast enhancement, without significant degeneration/devascularization. -- Right ovary:  Appears normal.  No mass identified. -- Left ovary:  Appears normal.  No mass identified. Other: Trace amount of free fluid in pelvic cul-de-sac is within a physiologic range.  Musculoskeletal:  Unremarkable. IMPRESSION: Diffuse uterine involvement by fibroids. Largest fibroid is pedunculated, arising from the left posterior corpus, measuring 6.9 cm in maximum diameter. Normal appearance of both ovaries    Oncologic History:      Malignant neoplasm of upper-inner quadrant of right breast in female,  estrogen receptor positive ([place])   04/21/2016 Initial Biopsy    Right breast upper inner quadrant biopsy: IDC, grade 2, ER/PR positive, Her-2 negative, Ki-67 20%.    04/27/2016 Initial Diagnosis    Malignant neoplasm of upper-inner quadrant of right breast in female, estrogen receptor positive ([place])    04/27/2016 -  Anti-estrogen oral therapy    Tamoxifen started, then stopped at start of chemotherapy and resumed in 12/26/2016, Goserelin started on 05/01/2017, to change to Anastrozole after 2-3 doses of Goserelin    05/09/2016 Genetic Testing    Patient has genetic testing done for personal and family history of breast cancer. Negative genetic testing on the [facility] panel.  The [facility] gene panel offered by [employer] includes sequencing and deletion/duplication testing of the following 28 genes: APC, ATM, BARD1, BMPR1A, BRCA1, BRCA2, BRIP1, CHD1, CDK4, CDKN2A, CHEK2, EPCAM (large rearrangement only), MLH1, MSH2, MSH6, MUTYH, NBN, PALB2, PMS2, PTEN, RAD51C, RAD51D, SMAD4, STK11, and TP53. Sequencing was performed for select regions of POLE and POLD1, and large rearrangement analysis was performed for select regions of GREM1.     06/07/2016 Surgery    Right lumpectomy and SLNB: IDC, grade 2, 1.8 cm, margins neg, 3 SLN biopsied, 1 with metastatic disease, 1 with micrometastatic disease  06/07/2016 Miscellaneous    Mammaprint high risk, indicated need for chemo    07/12/2016 - 09/27/2016 Adjuvant Chemotherapy    Doxorubicin and cyclophosphamide x 4 followed by 2 of weekly Paclitaxel stopped due to reaction, followed by 1 of Abraxane stopped for the same reason.    11/01/2016 - 12/19/2016 Radiation Therapy     1. Right Breast, axilla, SCV: 50 Gy in 25 fractions                          2. Right Breast Boost: 10 Gy in 5 fractions        Current Meds:  Outpatient Encounter Medications as of 01/01/2018  Medication Sig  . anastrozole (ARIMIDEX) 1 MG tablet Take 1 tablet (1 mg  total) by mouth daily.  Marland Kitchen goserelin (ZOLADEX) 10.8 MG injection Inject 10.8 mg into the skin every 30 (thirty) days.   . Multiple Vitamins-Minerals (HAIR SKIN AND NAILS FORMULA) TABS Take 1 tablet by mouth 2 (two) times daily.  Marland Kitchen gabapentin (NEURONTIN) 300 MG capsule Take 1 capsule (300 mg total) by mouth at bedtime. (Patient not taking: Reported on 01/01/2018)  . naproxen (NAPROSYN) 500 MG tablet Take 500 mg by mouth 2 (two) times daily as needed for pain.   No facility-administered encounter medications on file as of 01/01/2018.     Allergy:  Allergies  Allergen Reactions  . Paclitaxel Itching  . Amoxicillin Rash    Has patient had a PCN reaction causing immediate rash, facial/tongue/throat swelling, SOB or lightheadedness with hypotension: Yes Has patient had a PCN reaction causing severe rash involving mucus membranes or skin necrosis: Yes Has patient had a PCN reaction that required hospitalization: Yes Has patient had a PCN reaction occurring within the last 10 years: No If all of the above answers are "NO", then may proceed with Cephalosporin use.     Social Hx:   Social History   Socioeconomic History  . Marital status: Single    Spouse name: Not on file  . Number of children: Not on file  . Years of education: Not on file  . Highest education level: Not on file  Occupational History  . Not on file  Social Needs  . Financial resource strain: Not on file  . Food insecurity:    Worry: Not on file    Inability: Not on file  . Transportation needs:    Medical: Not on file    Non-medical: Not on file  Tobacco Use  . Smoking status: Never Smoker  . Smokeless tobacco: Never Used  Substance and Sexual Activity  . Alcohol use: Yes    Alcohol/week: 1.0 standard drinks    Types: 1 Glasses of wine per week    Comment: once per month  . Drug use: No  . Sexual activity: Not Currently    Partners: Male    Birth control/protection: None  Lifestyle  . Physical activity:     Days per week: Not on file    Minutes per session: Not on file  . Stress: Not on file  Relationships  . Social connections:    Talks on phone: Not on file    Gets together: Not on file    Attends religious service: Not on file    Active member of club or organization: Not on file    Attends meetings of clubs or organizations: Not on file    Relationship status: Not on file  . Intimate partner violence:  Fear of current or ex partner: Not on file    Emotionally abused: Not on file    Physically abused: Not on file    Forced sexual activity: Not on file  Other Topics Concern  . Not on file  Social History Narrative  . Not on file    Past Surgical Hx:  Past Surgical History:  Procedure Laterality Date  . BARTHOLIN CYST MARSUPIALIZATION  09/04/2002  . BARTHOLIN GLAND CYST EXCISION Left 07/13/2005  . BREAST LUMPECTOMY WITH RADIOACTIVE SEED AND SENTINEL LYMPH NODE BIOPSY Right 06/07/2016   Procedure: BREAST LUMPECTOMY WITH RADIOACTIVE SEED AND SENTINEL LYMPH NODE BIOPSY;  Surgeon: Alphonsa Overall, MD;  Location: [facility];  Service: General;  Laterality: Right;  . HYSTEROSCOPY  2016  . MYOMECTOMY  07/20/2004   Laparotomy  . OVARIAN CYST REMOVAL Right 07/20/2004  . PORTACATH PLACEMENT N/A 07/05/2016   REMOVED Fall 2018 ; Procedure: INSERTION PORT-A-CATH WITH [place];  Surgeon: Alphonsa Overall, MD;  Location: [place];  Service: General;  Laterality: N/A;    Past Medical Hx:  Past Medical History:  Diagnosis Date  . Breast cancer ([place]) 05/2016   right  . Chronic constipation   . Family history of cancer   . Fibroid, uterine   . IBS (irritable bowel syndrome)    IBS - C (constipation)  . Personal history of chemotherapy   . Personal history of radiation therapy     Past Gynecological History:   GYNECOLOGIC HISTORY:  No LMP recorded. (Menstrual status: Chemotherapy). Menarche: 42 years old P 0 LMP January 2019 Contraceptive oral contraceptives  greater than 10 years not currently used HRT relative only to her breast cancer treatment  Last Pap now on record from December 2017- for intraepithelial lesion no HPV testing was performed  Family Hx:  Family History  Problem Relation Age of Onset  . Breast cancer Mother 53  . Pancreatic cancer Paternal Grandmother   . Prostate cancer Paternal Grandfather   . Ovarian cancer Other        MGMs sister  . Breast cancer Other        mother's maternal first cousin    Review of Systems: Review of Systems  Gastrointestinal: Positive for abdominal distention and constipation.  Musculoskeletal: Positive for back pain.  All other systems reviewed and are negative.    Vitals:  Blood pressure 119/67, pulse (!) 54, temperature 98.7 F (37.1 C), temperature source Oral, resp. rate 18, height _0  (1.626 m), weight 163 lb 4 oz (74 kg), SpO2 100 %. Body mass index is 28.02 kg/m.   Physical Exam:  ECOG PERFORMANCE STATUS: 1 - Symptomatic but completely ambulatory   General :  Well developed, 42 y.o., female in no apparent distress HEENT:  Normocephalic/atraumatic, symmetric, EOMI, eyelids normal Neck:   No visible masses.  Respiratory:  Respirations unlabored, no use of accessory muscles CV:   Deferred Breast:  Deferred Musculoskeletal: Normal muscle strength. Abdomen:  No visible masses or protrusion Extremities:  No visible edema or deformities Skin:   Normal inspection Neuro/Psych:  No focal motor deficit, no abnormal mental status. Normal gait. Normal affect. Alert and oriented to person, place, and time   Genito Urinary: From 07/10/2017 visit Vulva: Normal external female genitalia. Slight defect ~3:00 introitus due to Bartholins surgery. Bladder/urethra: Urethral meatus normal in size and location. No lesions or   masses, well supported bladder Speculum exam: Vagina: +thick white discharge No lesion, no bleeding. Cervix: Normal appearing, no lesions. Bimanual  exam:  Uterus:  difficult to determine given probable fibroids, mobile.  Adnexa: +mass in culdesac, posterior to uterus and low.  Rectovaginal:  Good tone, + impingement from mass that feels to be attached to uterus. No cul de sac nodularity, no parametrial involvement or nodularity.   Oncologic Summary: 1. Right breast cancer, hormone receptor positive a. S/p surgery, chemo, radiation b. Currently on ovarian suppression/anastrozole 2. Fibroid uterus  Assessment/Plan: 1. Fibroid uterus o Previously we reviewed her MRI and ultrasound images  i. I looked at those personally again today o We previously reviewed the very low possibility of sarcomas however the patient is not having symptoms that make me concerned at this point o I suspect given the size of the fibroids this may be contributing to her intermittent constipation and feeling of "bloating" 2. Recurrence risk for breast cancer +/- BSO  o Defer to Dr. Jana Hakim  3. Procedure discussion o Risks of early menopause addressed including increased risk of osteoporosis, possibly heart disease have been discussed on multiple occasions.  o Given the size/location of the fibroids (I get the width at about 8cm in the mid fundus) and her prior history of myomectomy I think open approach is likely o However we will request robotic setup and at least do a diagnostic scope to determine feasibility of MIS i. She understands open approach is highly likely and even if MIS feasible she will need a minilap for delivery of the uterus. o We discussed the recovery time in both situations o The risks, benefits, and alternatives were reviewed and the surgical sketch presented on prior visits. i. She has had myomectomy and ovarian cystectomy in the past. Her adhesive disease and the size / location of the fibroids will determine her intraoperative injury risks which likely are higher than the average patient (for urinary tract and bowel injury). ii. She was given a copy of the  sketch last visit 4. She will return to see me 10-14 days postop  Face to face time with patient was 15 minutes. Over 50% of this time was spent on counseling and coordination of care.  Isabel Caprice, MD  01/01/2018, 6:05 PM  Cc: Raiford Simmonds, MD (PCP) Lurline Del, MD (Medical Oncology)

## 2018-01-01 NOTE — Patient Instructions (Signed)
Preparing for your Surgery  Plan for surgery on January 04, 2018 with Dr. Precious Haws at [place] will be scheduled for a robotic assisted total laparoscopic hysterectomy, bilateral salpingo-oophorectomy, possible mini-laparotomy, possible total abdominal hysterectomy.  Pre-operative Testing -You will receive a phone call from presurgical testing at [facility] to arrange for a pre-operative testing appointment before your surgery.  This appointment normally occurs one to two weeks before your scheduled surgery.   -Bring your insurance card, copy of an advanced directive if applicable, medication list  -At that visit, you will be asked to sign a consent for a possible blood transfusion in case a transfusion becomes necessary during surgery.  The need for a blood transfusion is rare but having consent is a necessary part of your care.     -You should not be taking blood thinners or aspirin at least ten days prior to surgery unless instructed by your surgeon.  Day Before Surgery at [place] will be asked to take in a clear liquid diet the day before surgery. See examples below.  Your role in recovery Your role is to become active as soon as directed by your doctor, while still giving yourself time to heal.  Rest when you feel tired. You will be asked to do the following in order to speed your recovery:  - Cough and breathe deeply. This helps toclear and expand your lungs and can prevent pneumonia. You may be given a spirometer to practice deep breathing. A staff member will show you how to use the spirometer. - Do mild physical activity. Walking or moving your legs help your circulation and body functions return to normal. A staff member will help you when you try to walk and will provide you with simple exercises. Do not try to get up or walk alone the first time. - Actively manage your pain. Managing your pain lets you move in comfort. We will ask you  to rate your pain on a scale of zero to 10. It is your responsibility to tell your doctor or nurse where and how much you hurt so your pain can be treated.  Special Considerations -If you are diabetic, you may be placed on insulin after surgery to have closer control over your blood sugars to promote healing and recovery.  This does not mean that you will be discharged on insulin.  If applicable, your oral antidiabetics will be resumed when you are tolerating a solid diet.  -Your final pathology results from surgery should be available by the Friday after surgery and the results will be relayed to you when available.  -Dr. Everitt Amber is the Surgeon that assists your GYN Oncologist with surgery.  The next day after your surgery you will either see your GYN Oncologist or Dr. Lahoma Crocker.   Blood Transfusion Information WHAT IS A BLOOD TRANSFUSION? A transfusion is the replacement of blood or some of its parts. Blood is made up of multiple cells which provide different functions.  Red blood cells carry oxygen and are used for blood loss replacement.  White blood cells fight against infection.  Platelets control bleeding.  Plasma helps clot blood.  Other blood products are available for specialized needs, such as hemophilia or other clotting disorders. BEFORE THE TRANSFUSION  Who gives blood for transfusions?   You may be able to donate blood to be used at a later date on yourself (autologous donation).  Relatives can be asked to donate blood. This is  generally not any safer than if you have received blood from a stranger. The same precautions are taken to ensure safety when a relative's blood is donated.  Healthy volunteers who are fully evaluated to make sure their blood is safe. This is blood bank blood. Transfusion therapy is the safest it has ever been in the practice of medicine. Before blood is taken from a donor, a complete history is taken to make sure that person has no  history of diseases nor engages in risky social behavior (examples are intravenous drug use or sexual activity with multiple partners). The donor's travel history is screened to minimize risk of transmitting infections, such as malaria. The donated blood is tested for signs of infectious diseases, such as HIV and hepatitis. The blood is then tested to be sure it is compatible with you in order to minimize the chance of a transfusion reaction. If you or a relative donates blood, this is often done in anticipation of surgery and is not appropriate for emergency situations. It takes many days to process the donated blood. RISKS AND COMPLICATIONS Although transfusion therapy is very safe and saves many lives, the main dangers of transfusion include:   Getting an infectious disease.  Developing a transfusion reaction. This is an allergic reaction to something in the blood you were given. Every precaution is taken to prevent this. The decision to have a blood transfusion has been considered carefully by your caregiver before blood is given. Blood is not given unless the benefits outweigh the risks.   Clear Liquid Diet, Adult A clear liquid diet is a diet that includes only liquids that you can see through. You may need to follow a clear liquid diet if:  You develop a medical condition right before or after you have surgery.  You were not able to eat food for a long period of time.  You had a condition that gave you diarrhea.  You are going to have an exam, such as a colonoscopy, in which instruments will be put into your body to look at parts of your digestive system.  You are going to have bowel surgery.  The usual goals of this diet are:  To rest the stomach and digestive system as much as possible.  To keep you hydrated.  To make sure you get some calories for energy.  To help you return to normal digestion.  Most people need to follow this diet for only a short period of time. What do  I need to know about this diet?  A clear liquid is a liquid that you can see through when you hold it up to a light.  A clear liquid diet does not provide all the nutrients that you need. It is important to choose a variety of the liquids that are allowed on this diet. That way, you will get as many nutrients as possible.  If you are not sure whether you can have certain items, ask your health care provider. What can I have?  Water and flavored water.  Fruit juices that do not have pulp, such as cranberry juice and apple juice.  Tea and coffee without milk or cream.  Clear bouillon or broth.  Broth-based soups that have been strained.  Flavored gelatins.  Honey.  Sugar water.  Frozen ice or frozen ice pops that do not contain milk, yogurt, fruit pieces, or fruit pulp.  Clear sodas.  Clear sports drinks. The items listed above may not be a complete list of  recommended liquids. Contact your dietitian for more options. What can I not have?  Juices that have pulp.  Milk.  Cream or cream-based soups.  Yogurt. The items listed above may not be a complete list of liquids to avoid. Contact your dietitian for more information. Summary  A clear liquid diet is a diet that includes only liquids that you can see through.  The goal of this diet is to help you recover by resting your digestive system, keeping you hydrated, and providing nutrients.  Make sure to avoid liquids with milk, cream, or pulp while on this diet. This information is not intended to replace advice given to you by your health care provider. Make sure you discuss any questions you have with your health care provider. Document Released: 03/14/2005 Document Revised: 10/27/2015 Document Reviewed: 02/08/2013 Elsevier Interactive Patient Education  [employer].

## 2018-01-02 ENCOUNTER — Other Ambulatory Visit: Payer: Self-pay

## 2018-01-02 ENCOUNTER — Encounter (HOSPITAL_COMMUNITY)
Admission: RE | Admit: 2018-01-02 | Discharge: 2018-01-02 | Disposition: A | Discharge status: Home / Self Care | Payer: Medicaid Other | Source: Ambulatory Visit | Attending: Obstetrics | Admitting: Obstetrics

## 2018-01-02 ENCOUNTER — Encounter (HOSPITAL_COMMUNITY): Payer: Self-pay

## 2018-01-02 DIAGNOSIS — Z01812 Encounter for preprocedural laboratory examination: Secondary | ICD-10-CM | POA: Diagnosis not present

## 2018-01-02 LAB — URINALYSIS, ROUTINE W REFLEX MICROSCOPIC
Bilirubin Urine: NEGATIVE
Glucose, UA: NEGATIVE mg/dL
Hgb urine dipstick: NEGATIVE
Ketones, ur: NEGATIVE mg/dL
Leukocytes, UA: NEGATIVE
Nitrite: NEGATIVE
Protein, ur: NEGATIVE mg/dL
Specific Gravity, Urine: 1.018 (ref 1.005–1.030)
pH: 5 (ref 5.0–8.0)

## 2018-01-02 LAB — CBC
HCT: 43.1 % (ref 36.0–46.0)
Hemoglobin: 14.6 g/dL (ref 12.0–15.0)
MCH: 31.5 pg (ref 26.0–34.0)
MCHC: 33.9 g/dL (ref 30.0–36.0)
MCV: 92.9 fL (ref 80.0–100.0)
Platelets: 197 10*3/uL (ref 150–400)
RBC: 4.64 MIL/uL (ref 3.87–5.11)
RDW: 12 % (ref 11.5–15.5)
WBC: 3.8 10*3/uL — ABNORMAL LOW (ref 4.0–10.5)
nRBC: 0 % (ref 0.0–0.2)

## 2018-01-02 LAB — COMPREHENSIVE METABOLIC PANEL
ALT: 18 U/L (ref 0–44)
AST: 27 U/L (ref 15–41)
Albumin: 4.3 g/dL (ref 3.5–5.0)
Alkaline Phosphatase: 73 U/L (ref 38–126)
Anion gap: 9 (ref 5–15)
BUN: 12 mg/dL (ref 6–20)
CO2: 29 mmol/L (ref 22–32)
Calcium: 9.9 mg/dL (ref 8.9–10.3)
Chloride: 111 mmol/L (ref 98–111)
Creatinine, Ser: 0.91 mg/dL (ref 0.44–1.00)
GFR calc Af Amer: >60 mL/min (ref >60)
GFR calc non Af Amer: >60 mL/min (ref >60)
Glucose, Bld: 114 mg/dL — ABNORMAL HIGH (ref 70–99)
Potassium: 3.8 mmol/L (ref 3.5–5.1)
Sodium: 149 mmol/L — ABNORMAL HIGH (ref 135–145)
Total Bilirubin: 0.8 mg/dL (ref 0.3–1.2)
Total Protein: 7.1 g/dL (ref 6.5–8.1)

## 2018-01-02 LAB — ABO/RH: ABO/RH(D): B POS

## 2018-01-02 LAB — PREGNANCY, URINE: Preg Test, Ur: NEGATIVE

## 2018-01-02 NOTE — Progress Notes (Signed)
01-31-17 (Epic) ECHO

## 2018-01-02 NOTE — Patient Instructions (Addendum)
BROOKSIE ELLWANGER  01/02/2018   Your procedure is scheduled on: 01-04-18     Report to [facility] Main  Entrance    Report to Admitting at 9:15 AM    Call this number if you have problems the morning of surgery [phone]     Please consume a Clear Liquid Diet the day before surgery   Remember: NO SOLID FOOD AFTER MIDNIGHT THE NIGHT PRIOR TO SURGERY. NOTHING BY MOUTH EXCEPT CLEAR LIQUIDS UNTIL 3 HOURS PRIOR TO SCHEDULED SURGERY. PLEASE FINISH ENSURE DRINK PER SURGEON ORDER 3 HOURS PRIOR TO SCHEDULED SURGERY TIME WHICH NEEDS TO BE COMPLETED AT 8:15 AM.    CLEAR LIQUID DIET   Foods Allowed                                                                     Foods Excluded  Coffee and tea, regular and decaf                             liquids that you cannot  Plain Jell-O in any flavor                                             see through such as: Fruit ices (not with fruit pulp)                                     milk, soups, orange juice  Iced Popsicles                                    All solid food Carbonated beverages, regular and diet                                    Cranberry, grape and apple juices Sports drinks like Gatorade Lightly seasoned clear broth or consume(fat free) Sugar, honey syrup  Sample Menu Breakfast                                Lunch                                     Supper Cranberry juice                    Beef broth                            Chicken broth Jell-O  Grape juice                           Apple juice Coffee or tea                        Jell-O                                      Popsicle                                                Coffee or tea                        Coffee or tea  _____________________________________________________________________   BRUSH YOUR TEETH MORNING OF SURGERY AND RINSE YOUR MOUTH OUT, NO CHEWING GUM CANDY OR MINTS.     Take these  medicines the morning of surgery with A SIP OF WATER: Anastrozole (Arimidex)                                You may not have any metal on your body including hair pins and              piercings  Do not wear jewelry, make-up, lotions, powders or perfumes, deodorant             Do not wear nail polish.  Do not shave  48 hours prior to surgery.                 Do not bring valuables to the hospital. [place].  Contacts, dentures or bridgework may not be worn into surgery.  Leave suitcase in the car. After surgery it may be brought to your room.      Special Instructions: Please consume a Clear Liquid Diet the day before surgery.              Please read over the following fact sheets you were given: _____________________________________________________________________             [facility] - Preparing for Surgery Before surgery, you can play an important role.  Because skin is not sterile, your skin needs to be as free of germs as possible.  You can reduce the number of germs on your skin by washing with CHG (chlorahexidine gluconate) soap before surgery.  CHG is an antiseptic cleaner which kills germs and bonds with the skin to continue killing germs even after washing. Please DO NOT use if you have an allergy to CHG or antibacterial soaps.  If your skin becomes reddened/irritated stop using the CHG and inform your nurse when you arrive at Short Stay. Do not shave (including legs and underarms) for at least 48 hours prior to the first CHG shower.  You may shave your face/neck. Please follow these instructions carefully:  1.  Shower with CHG Soap the night before surgery and the  morning of Surgery.  2.  If you choose to wash your hair, wash your hair first as usual with your  normal  shampoo.  3.  After you shampoo, rinse your hair and body thoroughly to remove the  shampoo.                           4.  Use CHG as you would any other  liquid soap.  You can apply chg directly  to the skin and wash                       Gently with a scrungie or clean washcloth.  5.  Apply the CHG Soap to your body ONLY FROM THE NECK DOWN.   Do not use on face/ open                           Wound or open sores. Avoid contact with eyes, ears mouth and genitals (private parts).                       Wash face,  Genitals (private parts) with your normal soap.             6.  Wash thoroughly, paying special attention to the area where your surgery  will be performed.  7.  Thoroughly rinse your body with warm water from the neck down.  8.  DO NOT shower/wash with your normal soap after using and rinsing off  the CHG Soap.                9.  Pat yourself dry with a clean towel.            10.  Wear clean pajamas.            11.  Place clean sheets on your bed the night of your first shower and do not  sleep with pets. Day of Surgery : Do not apply any lotions/deodorants the morning of surgery.  Please wear clean clothes to the hospital/surgery center.  FAILURE TO FOLLOW THESE INSTRUCTIONS MAY RESULT IN THE CANCELLATION OF YOUR SURGERY PATIENT SIGNATURE_________________________________  NURSE SIGNATURE__________________________________  ________________________________________________________________________   Adam Phenix  An incentive spirometer is a tool that can help keep your lungs clear and active. This tool measures how well you are filling your lungs with each breath. Taking long deep breaths may help reverse or decrease the chance of developing breathing (pulmonary) problems (especially infection) following:  A long period of time when you are unable to move or be active. BEFORE THE PROCEDURE   If the spirometer includes an indicator to show your best effort, your nurse or respiratory therapist will set it to a desired goal.  If possible, sit up straight or lean slightly forward. Try not to slouch.  Hold the incentive  spirometer in an upright position. INSTRUCTIONS FOR USE  1. Sit on the edge of your bed if possible, or sit up as far as you can in bed or on a chair. 2. Hold the incentive spirometer in an upright position. 3. Breathe out normally. 4. Place the mouthpiece in your mouth and seal your lips tightly around it. 5. Breathe in slowly and as deeply as possible, raising the piston or the ball toward the top of the column. 6. Hold your breath for 3-5 seconds or for as long as possible. Allow the piston or ball to fall to the bottom of the column. 7. Remove the mouthpiece from your mouth and  breathe out normally. 8. Rest for a few seconds and repeat Steps 1 through 7 at least 10 times every 1-2 hours when you are awake. Take your time and take a few normal breaths between deep breaths. 9. The spirometer may include an indicator to show your best effort. Use the indicator as a goal to work toward during each repetition. 10. After each set of 10 deep breaths, practice coughing to be sure your lungs are clear. If you have an incision (the cut made at the time of surgery), support your incision when coughing by placing a pillow or rolled up towels firmly against it. Once you are able to get out of bed, walk around indoors and cough well. You may stop using the incentive spirometer when instructed by your caregiver.  RISKS AND COMPLICATIONS  Take your time so you do not get dizzy or light-headed.  If you are in pain, you may need to take or ask for pain medication before doing incentive spirometry. It is harder to take a deep breath if you are having pain. AFTER USE  Rest and breathe slowly and easily.  It can be helpful to keep track of a log of your progress. Your caregiver can provide you with a simple table to help with this. If you are using the spirometer at home, follow these instructions: [place] IF:   You are having difficultly using the spirometer.  You have trouble using the  spirometer as often as instructed.  Your pain medication is not giving enough relief while using the spirometer.  You develop fever of 100.5 F (38.1 C) or higher. SEEK IMMEDIATE MEDICAL CARE IF:   You cough up bloody sputum that had not been present before.  You develop fever of 102 F (38.9 C) or greater.  You develop worsening pain at or near the incision site. MAKE SURE YOU:   Understand these instructions.  Will watch your condition.  Will get help right away if you are not doing well or get worse. Document Released: 07/25/2006 Document Revised: 06/06/2011 Document Reviewed: 09/25/2006 Exit[employer] Patient Information 2014 Exit[employer], [place].   ________________________________________________________________________  WHAT IS A BLOOD TRANSFUSION? Blood Transfusion Information  A transfusion is the replacement of blood or some of its parts. Blood is made up of multiple cells which provide different functions.  Red blood cells carry oxygen and are used for blood loss replacement.  White blood cells fight against infection.  Platelets control bleeding.  Plasma helps clot blood.  Other blood products are available for specialized needs, such as hemophilia or other clotting disorders. BEFORE THE TRANSFUSION  Who gives blood for transfusions?   Healthy volunteers who are fully evaluated to make sure their blood is safe. This is blood bank blood. Transfusion therapy is the safest it has ever been in the practice of medicine. Before blood is taken from a donor, a complete history is taken to make sure that person has no history of diseases nor engages in risky social behavior (examples are intravenous drug use or sexual activity with multiple partners). The donor's travel history is screened to minimize risk of transmitting infections, such as malaria. The donated blood is tested for signs of infectious diseases, such as HIV and hepatitis. The blood is then tested to be sure it is  compatible with you in order to minimize the chance of a transfusion reaction. If you or a relative donates blood, this is often done in anticipation of surgery and is not appropriate for  emergency situations. It takes many days to process the donated blood. RISKS AND COMPLICATIONS Although transfusion therapy is very safe and saves many lives, the main dangers of transfusion include:   Getting an infectious disease.  Developing a transfusion reaction. This is an allergic reaction to something in the blood you were given. Every precaution is taken to prevent this. The decision to have a blood transfusion has been considered carefully by your caregiver before blood is given. Blood is not given unless the benefits outweigh the risks. AFTER THE TRANSFUSION  Right after receiving a blood transfusion, you will usually feel much better and more energetic. This is especially true if your red blood cells have gotten low (anemic). The transfusion raises the level of the red blood cells which carry oxygen, and this usually causes an energy increase.  The nurse administering the transfusion will monitor you carefully for complications. HOME CARE INSTRUCTIONS  No special instructions are needed after a transfusion. You may find your energy is better. Speak with your caregiver about any limitations on activity for underlying diseases you may have. SEEK MEDICAL CARE IF:   Your condition is not improving after your transfusion.  You develop redness or irritation at the intravenous (IV) site. SEEK IMMEDIATE MEDICAL CARE IF:  Any of the following symptoms occur over the next 12 hours:  Shaking chills.  You have a temperature by mouth above 102 F (38.9 C), not controlled by medicine.  Chest, back, or muscle pain.  People around you feel you are not acting correctly or are confused.  Shortness of breath or difficulty breathing.  Dizziness and fainting.  You get a rash or develop hives.  You have  a decrease in urine output.  Your urine turns a dark color or changes to pink, red, or brown. Any of the following symptoms occur over the next 10 days:  You have a temperature by mouth above 102 F (38.9 C), not controlled by medicine.  Shortness of breath.  Weakness after normal activity.  The white part of the eye turns yellow (jaundice).  You have a decrease in the amount of urine or are urinating less often.  Your urine turns a dark color or changes to pink, red, or brown. Document Released: 03/11/2000 Document Revised: 06/06/2011 Document Reviewed: 10/29/2007 [facility] Patient Information 2014 [place], [place].  _______________________________________________________________________

## 2018-01-03 NOTE — Anesthesia Preprocedure Evaluation (Addendum)
Anesthesia Evaluation  Patient identified by MRN, date of birth, ID band Patient awake    Reviewed: Allergy & Precautions, H&P , NPO status , Patient's Chart, lab work & pertinent test results  Airway Mallampati: II  TM Distance: >3 FB Neck ROM: Full    Dental no notable dental hx. (+) Teeth Intact, Dental Advisory Given   Pulmonary neg pulmonary ROS,    Pulmonary exam normal breath sounds clear to auscultation       Cardiovascular Exercise Tolerance: Good Angina:   negative cardio ROS Normal cardiovascular exam Rhythm:Regular Rate:Normal  ECHO 01/31/2017 Left ventricle: The cavity size was normal. Wall thickness was   normal. Systolic function was normal. The estimated ejection   fraction was in the range of 55% to 60%. Wall motion was normal;   there were no regional wall motion abnormalities. Left   ventricular diastolic function parameters were normal.   Neuro/Psych negative neurological ROS  negative psych ROS   GI/Hepatic negative GI ROS, Neg liver ROS,   Endo/Other    Renal/GU      Musculoskeletal   Abdominal   Peds  Hematology   Anesthesia Other Findings Hx of Breast Ca  Reproductive/Obstetrics                            Lab Results  Component Value Date   CREATININE 0.91 01/02/2018   BUN 12 01/02/2018   NA 149 (H) 01/02/2018   K 3.8 01/02/2018   CL 111 01/02/2018   CO2 29 01/02/2018    Lab Results  Component Value Date   WBC 3.8 (L) 01/02/2018   HGB 14.6 01/02/2018   HCT 43.1 01/02/2018   MCV 92.9 01/02/2018   PLT 197 01/02/2018    Anesthesia Physical Anesthesia Plan  ASA: II  Anesthesia Plan: General   Post-op Pain Management:    Induction: Intravenous  PONV Risk Score and Plan: 4 or greater and Treatment may vary due to age or medical condition, Ondansetron, Dexamethasone and Scopolamine patch - Pre-op  Airway Management Planned: Oral ETT  Additional  Equipment:   Intra-op Plan:   Post-operative Plan: Extubation in OR  Informed Consent: I have reviewed the patients History and Physical, chart, labs and discussed the procedure including the risks, benefits and alternatives for the proposed anesthesia with the patient or authorized representative who has indicated his/her understanding and acceptance.   Dental advisory given  Plan Discussed with:   Anesthesia Plan Comments:         Anesthesia Quick Evaluation

## 2018-01-04 ENCOUNTER — Ambulatory Visit (HOSPITAL_COMMUNITY)
Admission: RE | Admit: 2018-01-04 | Discharge: 2018-01-05 | Disposition: A | Discharge status: Home / Self Care | Payer: Medicaid Other | Source: Ambulatory Visit | Attending: Obstetrics | Admitting: Obstetrics

## 2018-01-04 ENCOUNTER — Ambulatory Visit (HOSPITAL_COMMUNITY): Payer: Medicaid Other | Admitting: Anesthesiology

## 2018-01-04 ENCOUNTER — Encounter (HOSPITAL_COMMUNITY)
Admission: RE | Disposition: A | Discharge status: Home / Self Care | Payer: Self-pay | Source: Ambulatory Visit | Attending: Obstetrics

## 2018-01-04 ENCOUNTER — Encounter (HOSPITAL_COMMUNITY): Payer: Self-pay | Admitting: *Deleted

## 2018-01-04 ENCOUNTER — Other Ambulatory Visit: Payer: Self-pay

## 2018-01-04 DIAGNOSIS — C50211 Malignant neoplasm of upper-inner quadrant of right female breast: Secondary | ICD-10-CM

## 2018-01-04 DIAGNOSIS — Z191 Hormone sensitive malignancy status: Secondary | ICD-10-CM

## 2018-01-04 DIAGNOSIS — D259 Leiomyoma of uterus, unspecified: Secondary | ICD-10-CM | POA: Diagnosis present

## 2018-01-04 DIAGNOSIS — Z880 Allergy status to penicillin: Secondary | ICD-10-CM | POA: Insufficient documentation

## 2018-01-04 DIAGNOSIS — N8302 Follicular cyst of left ovary: Secondary | ICD-10-CM | POA: Insufficient documentation

## 2018-01-04 DIAGNOSIS — Z170 Estrogen receptor positive status [ER+]: Secondary | ICD-10-CM | POA: Diagnosis not present

## 2018-01-04 DIAGNOSIS — Z79810 Long term (current) use of selective estrogen receptor modulators (SERMs): Secondary | ICD-10-CM | POA: Diagnosis not present

## 2018-01-04 DIAGNOSIS — Z803 Family history of malignant neoplasm of breast: Secondary | ICD-10-CM | POA: Insufficient documentation

## 2018-01-04 DIAGNOSIS — D251 Intramural leiomyoma of uterus: Secondary | ICD-10-CM | POA: Diagnosis not present

## 2018-01-04 DIAGNOSIS — Z888 Allergy status to other drugs, medicaments and biological substances status: Secondary | ICD-10-CM | POA: Insufficient documentation

## 2018-01-04 DIAGNOSIS — Z79818 Long term (current) use of other agents affecting estrogen receptors and estrogen levels: Secondary | ICD-10-CM | POA: Diagnosis not present

## 2018-01-04 DIAGNOSIS — D252 Subserosal leiomyoma of uterus: Secondary | ICD-10-CM

## 2018-01-04 DIAGNOSIS — K581 Irritable bowel syndrome with constipation: Secondary | ICD-10-CM | POA: Insufficient documentation

## 2018-01-04 DIAGNOSIS — Z923 Personal history of irradiation: Secondary | ICD-10-CM | POA: Diagnosis not present

## 2018-01-04 DIAGNOSIS — N8301 Follicular cyst of right ovary: Secondary | ICD-10-CM | POA: Insufficient documentation

## 2018-01-04 DIAGNOSIS — Z9221 Personal history of antineoplastic chemotherapy: Secondary | ICD-10-CM | POA: Diagnosis not present

## 2018-01-04 DIAGNOSIS — Z853 Personal history of malignant neoplasm of breast: Secondary | ICD-10-CM | POA: Diagnosis present

## 2018-01-04 DIAGNOSIS — Z791 Long term (current) use of non-steroidal anti-inflammatories (NSAID): Secondary | ICD-10-CM | POA: Insufficient documentation

## 2018-01-04 HISTORY — PX: ROBOTIC ASSISTED TOTAL HYSTERECTOMY WITH BILATERAL SALPINGO OOPHERECTOMY: SHX6086

## 2018-01-04 LAB — TYPE AND SCREEN
ABO/RH(D): B POS
Antibody Screen: NEGATIVE

## 2018-01-04 SURGERY — HYSTERECTOMY, TOTAL, ROBOT-ASSISTED, LAPAROSCOPIC, WITH BILATERAL SALPINGO-OOPHORECTOMY
Anesthesia: General

## 2018-01-04 MED ORDER — HYDROMORPHONE HCL 1 MG/ML IJ SOLN: 0.5000 mg | INTRAMUSCULAR | Status: DC | PRN

## 2018-01-04 MED ORDER — MIDAZOLAM HCL 2 MG/2ML IJ SOLN
INTRAMUSCULAR | Status: AC
  Filled 2018-01-04: qty 2

## 2018-01-04 MED ORDER — ACETAMINOPHEN 500 MG PO TABS
1000.0000 mg | ORAL_TABLET | Freq: Four times a day (QID) | ORAL | Status: DC
  Administered 2018-01-04 – 2018-01-05 (×3): 1000 mg via ORAL
  Filled 2018-01-04 (×3): qty 2

## 2018-01-04 MED ORDER — KCL IN DEXTROSE-NACL 20-5-0.45 MEQ/L-%-% IV SOLN
INTRAVENOUS | Status: DC
  Administered 2018-01-04: 17:00:00 via INTRAVENOUS
  Filled 2018-01-04: qty 1000

## 2018-01-04 MED ORDER — LIDOCAINE 2% (20 MG/ML) 5 ML SYRINGE
INTRAMUSCULAR | Status: DC | PRN
  Administered 2018-01-04: 1.5 mg/kg/h via INTRAVENOUS

## 2018-01-04 MED ORDER — MIDAZOLAM HCL 5 MG/5ML IJ SOLN
INTRAMUSCULAR | Status: DC | PRN
  Administered 2018-01-04: 2 mg via INTRAVENOUS

## 2018-01-04 MED ORDER — LIDOCAINE 2% (20 MG/ML) 5 ML SYRINGE
INTRAMUSCULAR | Status: DC | PRN
  Administered 2018-01-04: 40 mg via INTRAVENOUS

## 2018-01-04 MED ORDER — DIPHENHYDRAMINE HCL 50 MG/ML IJ SOLN: 25.0000 mg | Freq: Four times a day (QID) | INTRAMUSCULAR | Status: DC | PRN

## 2018-01-04 MED ORDER — KETOROLAC TROMETHAMINE 30 MG/ML IJ SOLN
30.0000 mg | Freq: Four times a day (QID) | INTRAMUSCULAR | Status: DC
  Administered 2018-01-04 – 2018-01-05 (×3): 30 mg via INTRAVENOUS
  Filled 2018-01-04 (×3): qty 1

## 2018-01-04 MED ORDER — 0.9 % SODIUM CHLORIDE (POUR BTL) OPTIME
TOPICAL | Status: DC | PRN
  Administered 2018-01-04: 1000 mL

## 2018-01-04 MED ORDER — MEPERIDINE HCL 50 MG/ML IJ SOLN: 6.2500 mg | INTRAMUSCULAR | Status: DC | PRN

## 2018-01-04 MED ORDER — HYDROCODONE-ACETAMINOPHEN 7.5-325 MG PO TABS: 1.0000 | ORAL_TABLET | Freq: Once | ORAL | Status: DC | PRN

## 2018-01-04 MED ORDER — LACTATED RINGERS IR SOLN
Status: DC | PRN
  Administered 2018-01-04: 1000 mL

## 2018-01-04 MED ORDER — ONDANSETRON HCL 4 MG/2ML IJ SOLN
INTRAMUSCULAR | Status: AC
  Filled 2018-01-04: qty 2

## 2018-01-04 MED ORDER — ACETAMINOPHEN 500 MG PO TABS
1000.0000 mg | ORAL_TABLET | ORAL | Status: AC
  Administered 2018-01-04: 1000 mg via ORAL
  Filled 2018-01-04: qty 2

## 2018-01-04 MED ORDER — DIPHENHYDRAMINE HCL 25 MG PO CAPS: 25.0000 mg | ORAL_CAPSULE | Freq: Four times a day (QID) | ORAL | Status: DC | PRN

## 2018-01-04 MED ORDER — PROMETHAZINE HCL 25 MG/ML IJ SOLN: 6.2500 mg | INTRAMUSCULAR | Status: DC | PRN

## 2018-01-04 MED ORDER — OXYCODONE HCL 5 MG PO TABS: 5.0000 mg | ORAL_TABLET | ORAL | Status: DC | PRN

## 2018-01-04 MED ORDER — SUGAMMADEX SODIUM 200 MG/2ML IV SOLN
INTRAVENOUS | Status: DC | PRN
  Administered 2018-01-04: 300 mg via INTRAVENOUS

## 2018-01-04 MED ORDER — ONDANSETRON HCL 4 MG/2ML IJ SOLN
4.0000 mg | Freq: Four times a day (QID) | INTRAMUSCULAR | Status: DC | PRN
  Administered 2018-01-04: 4 mg via INTRAVENOUS
  Filled 2018-01-04: qty 2

## 2018-01-04 MED ORDER — HYDROMORPHONE HCL 1 MG/ML IJ SOLN
INTRAMUSCULAR | Status: AC
  Filled 2018-01-04: qty 1

## 2018-01-04 MED ORDER — ROCURONIUM BROMIDE 10 MG/ML (PF) SYRINGE
PREFILLED_SYRINGE | INTRAVENOUS | Status: DC | PRN
  Administered 2018-01-04: 50 mg via INTRAVENOUS

## 2018-01-04 MED ORDER — PHENYLEPHRINE 40 MCG/ML (10ML) SYRINGE FOR IV PUSH (FOR BLOOD PRESSURE SUPPORT)
PREFILLED_SYRINGE | INTRAVENOUS | Status: DC | PRN
  Administered 2018-01-04 (×5): 80 ug via INTRAVENOUS

## 2018-01-04 MED ORDER — HYDROMORPHONE HCL 1 MG/ML IJ SOLN
0.2500 mg | INTRAMUSCULAR | Status: DC | PRN
  Administered 2018-01-04 (×4): 0.5 mg via INTRAVENOUS

## 2018-01-04 MED ORDER — ONDANSETRON HCL 4 MG/2ML IJ SOLN
INTRAMUSCULAR | Status: DC | PRN
  Administered 2018-01-04: 4 mg via INTRAVENOUS

## 2018-01-04 MED ORDER — CELECOXIB 200 MG PO CAPS
200.0000 mg | ORAL_CAPSULE | Freq: Two times a day (BID) | ORAL | Status: DC
  Administered 2018-01-04 – 2018-01-05 (×2): 200 mg via ORAL
  Filled 2018-01-04 (×2): qty 1

## 2018-01-04 MED ORDER — DEXAMETHASONE SODIUM PHOSPHATE 10 MG/ML IJ SOLN
INTRAMUSCULAR | Status: AC
  Filled 2018-01-04: qty 1

## 2018-01-04 MED ORDER — ZOLPIDEM TARTRATE 5 MG PO TABS
5.0000 mg | ORAL_TABLET | Freq: Every evening | ORAL | Status: DC | PRN
  Administered 2018-01-04: 5 mg via ORAL
  Filled 2018-01-04: qty 1

## 2018-01-04 MED ORDER — CELECOXIB 200 MG PO CAPS
400.0000 mg | ORAL_CAPSULE | ORAL | Status: AC
  Administered 2018-01-04: 400 mg via ORAL
  Filled 2018-01-04: qty 2

## 2018-01-04 MED ORDER — LIDOCAINE HCL (CARDIAC) PF 100 MG/5ML IV SOSY
PREFILLED_SYRINGE | INTRAVENOUS | Status: AC
  Filled 2018-01-04: qty 5

## 2018-01-04 MED ORDER — KETOROLAC TROMETHAMINE 30 MG/ML IJ SOLN
30.0000 mg | Freq: Once | INTRAMUSCULAR | Status: AC | PRN
  Administered 2018-01-04: 30 mg via INTRAVENOUS

## 2018-01-04 MED ORDER — ONDANSETRON HCL 4 MG PO TABS
4.0000 mg | ORAL_TABLET | Freq: Four times a day (QID) | ORAL | Status: DC | PRN
  Administered 2018-01-05: 4 mg via ORAL
  Filled 2018-01-04: qty 1

## 2018-01-04 MED ORDER — STERILE WATER FOR IRRIGATION IR SOLN
Status: DC | PRN
  Administered 2018-01-04: 1000 mL

## 2018-01-04 MED ORDER — PROPOFOL 10 MG/ML IV BOLUS
INTRAVENOUS | Status: AC
  Filled 2018-01-04: qty 20

## 2018-01-04 MED ORDER — PROPOFOL 10 MG/ML IV BOLUS
INTRAVENOUS | Status: DC | PRN
  Administered 2018-01-04: 160 mg via INTRAVENOUS

## 2018-01-04 MED ORDER — SUGAMMADEX SODIUM 500 MG/5ML IV SOLN
INTRAVENOUS | Status: AC
  Filled 2018-01-04: qty 5

## 2018-01-04 MED ORDER — FENTANYL CITRATE (PF) 250 MCG/5ML IJ SOLN
INTRAMUSCULAR | Status: DC | PRN
  Administered 2018-01-04: 100 ug via INTRAVENOUS
  Administered 2018-01-04: 50 ug via INTRAVENOUS

## 2018-01-04 MED ORDER — GABAPENTIN 300 MG PO CAPS
300.0000 mg | ORAL_CAPSULE | ORAL | Status: AC
  Administered 2018-01-04: 300 mg via ORAL
  Filled 2018-01-04: qty 1

## 2018-01-04 MED ORDER — DEXAMETHASONE SODIUM PHOSPHATE 10 MG/ML IJ SOLN
INTRAMUSCULAR | Status: DC | PRN
  Administered 2018-01-04: 10 mg via INTRAVENOUS

## 2018-01-04 MED ORDER — EPHEDRINE SULFATE-NACL 50-0.9 MG/10ML-% IV SOSY
PREFILLED_SYRINGE | INTRAVENOUS | Status: DC | PRN
  Administered 2018-01-04: 5 mg via INTRAVENOUS

## 2018-01-04 MED ORDER — ANASTROZOLE 1 MG PO TABS
1.0000 mg | ORAL_TABLET | Freq: Every day | ORAL | Status: DC
  Administered 2018-01-05: 1 mg via ORAL
  Filled 2018-01-04: qty 1

## 2018-01-04 MED ORDER — KETAMINE HCL 10 MG/ML IJ SOLN
INTRAMUSCULAR | Status: DC | PRN
  Administered 2018-01-04: 30 mg via INTRAVENOUS

## 2018-01-04 MED ORDER — DEXAMETHASONE SODIUM PHOSPHATE 4 MG/ML IJ SOLN: 4.0000 mg | INTRAMUSCULAR | Status: DC

## 2018-01-04 MED ORDER — LACTATED RINGERS IV SOLN
INTRAVENOUS | Status: DC
  Administered 2018-01-04 (×2): via INTRAVENOUS

## 2018-01-04 MED ORDER — GABAPENTIN 300 MG PO CAPS
300.0000 mg | ORAL_CAPSULE | Freq: Every day | ORAL | Status: DC
  Administered 2018-01-04: 300 mg via ORAL
  Filled 2018-01-04: qty 1

## 2018-01-04 MED ORDER — SCOPOLAMINE 1 MG/3DAYS TD PT72
1.0000 | MEDICATED_PATCH | TRANSDERMAL | Status: DC
  Administered 2018-01-04: 1.5 mg via TRANSDERMAL
  Filled 2018-01-04: qty 1

## 2018-01-04 MED ORDER — ROCURONIUM BROMIDE 100 MG/10ML IV SOLN
INTRAVENOUS | Status: AC
  Filled 2018-01-04: qty 1

## 2018-01-04 MED ORDER — FENTANYL CITRATE (PF) 250 MCG/5ML IJ SOLN
INTRAMUSCULAR | Status: AC
  Filled 2018-01-04: qty 5

## 2018-01-04 MED ORDER — KETAMINE HCL 10 MG/ML IJ SOLN
INTRAMUSCULAR | Status: AC
  Filled 2018-01-04: qty 1

## 2018-01-04 MED ORDER — KETOROLAC TROMETHAMINE 30 MG/ML IJ SOLN
INTRAMUSCULAR | Status: AC
  Filled 2018-01-04: qty 1

## 2018-01-04 MED ORDER — CEFAZOLIN SODIUM-DEXTROSE 2-4 GM/100ML-% IV SOLN
2.0000 g | INTRAVENOUS | Status: AC
  Administered 2018-01-04: 2 g via INTRAVENOUS
  Filled 2018-01-04: qty 100

## 2018-01-04 SURGICAL SUPPLY — 70 items
APPLICATOR COTTON TIP 6 STRL (MISCELLANEOUS) IMPLANT
APPLICATOR COTTON TIP 6IN STRL (MISCELLANEOUS)
APPLICATOR SURGIFLO ENDO (HEMOSTASIS) IMPLANT
BAG LAPAROSCOPIC 12 15 PORT 16 (BASKET) IMPLANT
BAG RETRIEVAL 12/15 (BASKET)
COVER BACK TABLE 60X90IN (DRAPES) ×3 IMPLANT
COVER TIP SHEARS 8 DVNC (MISCELLANEOUS) ×2 IMPLANT
COVER TIP SHEARS 8MM DA VINCI (MISCELLANEOUS) ×1
COVER WAND RF STERILE (DRAPES) ×3 IMPLANT
DERMABOND ADVANCED (GAUZE/BANDAGES/DRESSINGS) ×1
DERMABOND ADVANCED .7 DNX12 (GAUZE/BANDAGES/DRESSINGS) ×2 IMPLANT
DRAPE ARM DVNC X/XI (DISPOSABLE) ×8 IMPLANT
DRAPE COLUMN DVNC XI (DISPOSABLE) ×2 IMPLANT
DRAPE DA VINCI XI ARM (DISPOSABLE) ×4
DRAPE DA VINCI XI COLUMN (DISPOSABLE) ×1
DRAPE SHEET LG 3/4 BI-LAMINATE (DRAPES) ×3 IMPLANT
DRAPE SURG IRRIG POUCH 19X23 (DRAPES) ×3 IMPLANT
DRAPE UNDERBUTTOCKS STRL (DRAPE) ×3 IMPLANT
ELECT REM PT RETURN 15FT ADLT (MISCELLANEOUS) ×3 IMPLANT
GAUZE 4X4 16PLY RFD (DISPOSABLE) ×6 IMPLANT
GLOVE BIO SURGEON STRL SZ 6 (GLOVE) ×6 IMPLANT
GLOVE BIO SURGEON STRL SZ 6.5 (GLOVE) IMPLANT
GLOVE BIOGEL PI IND STRL 7.0 (GLOVE) ×6 IMPLANT
GLOVE BIOGEL PI INDICATOR 7.0 (GLOVE) ×3
GLOVE SURG SS PI 6.5 STRL IVOR (GLOVE) ×9 IMPLANT
GOWN STRL REUS W/ TWL LRG LVL3 (GOWN DISPOSABLE) ×2 IMPLANT
GOWN STRL REUS W/TWL LRG LVL3 (GOWN DISPOSABLE) ×1
GOWN STRL REUS W/TWL XL LVL3 (GOWN DISPOSABLE) ×6 IMPLANT
GYRUS RUMI II 2.5CM BLUE (DISPOSABLE)
GYRUS RUMI II 3.5CM BLUE (DISPOSABLE)
HOLDER FOLEY CATH W/STRAP (MISCELLANEOUS) ×3 IMPLANT
IRRIG SUCT STRYKERFLOW 2 WTIP (MISCELLANEOUS) ×3
IRRIGATION SUCT STRKRFLW 2 WTP (MISCELLANEOUS) ×2 IMPLANT
KIT PROCEDURE DA VINCI SI (MISCELLANEOUS)
KIT PROCEDURE DVNC SI (MISCELLANEOUS) IMPLANT
MANIPULATOR UTERINE 4.5 ZUMI (MISCELLANEOUS) IMPLANT
NEEDLE HYPO 25X1 1.5 SAFETY (NEEDLE) ×3 IMPLANT
NEEDLE SPNL 18GX3.5 QUINCKE PK (NEEDLE) IMPLANT
OBTURATOR OPTICAL STANDARD 8MM (TROCAR) ×1
OBTURATOR OPTICAL STND 8 DVNC (TROCAR) ×2
OBTURATOR OPTICALSTD 8 DVNC (TROCAR) ×2 IMPLANT
PACK ROBOT GYN CUSTOM WL (TRAY / TRAY PROCEDURE) ×3 IMPLANT
PAD POSITIONING PINK XL (MISCELLANEOUS) ×3 IMPLANT
PORT ACCESS TROCAR AIRSEAL 12 (TROCAR) ×2 IMPLANT
PORT ACCESS TROCAR AIRSEAL 5M (TROCAR) ×1
POUCH SPECIMEN RETRIEVAL 10MM (ENDOMECHANICALS) IMPLANT
RUMI II 3.0CM BLUE KOH-EFFICIE (DISPOSABLE) ×3 IMPLANT
RUMI II GYRUS 2.5CM BLUE (DISPOSABLE) IMPLANT
RUMI II GYRUS 3.5CM BLUE (DISPOSABLE) IMPLANT
SEAL CANN UNIV 5-8 DVNC XI (MISCELLANEOUS) ×8 IMPLANT
SEAL XI 5MM-8MM UNIVERSAL (MISCELLANEOUS) ×4
SET TRI-LUMEN FLTR TB AIRSEAL (TUBING) ×3 IMPLANT
SURGIFLO W/THROMBIN 8M KIT (HEMOSTASIS) IMPLANT
SUT VIC AB 0 CT1 27 (SUTURE)
SUT VIC AB 0 CT1 27XBRD ANTBC (SUTURE) IMPLANT
SUT VIC AB 4-0 P2 18 (SUTURE) ×6 IMPLANT
SUT VLOC 180 0 9IN  GS21 (SUTURE) ×2
SUT VLOC 180 0 9IN GS21 (SUTURE) ×4 IMPLANT
SYR 10ML LL (SYRINGE) IMPLANT
TIP RUMI ORANGE 6.7MMX12CM (TIP) IMPLANT
TIP UTERINE 5.1X6CM LAV DISP (MISCELLANEOUS) IMPLANT
TIP UTERINE 6.7X10CM GRN DISP (MISCELLANEOUS) IMPLANT
TIP UTERINE 6.7X6CM WHT DISP (MISCELLANEOUS) IMPLANT
TIP UTERINE 6.7X8CM BLUE DISP (MISCELLANEOUS) ×3 IMPLANT
TOWEL OR NON WOVEN STRL DISP B (DISPOSABLE) ×3 IMPLANT
TRAP SPECIMEN MUCOUS 40CC (MISCELLANEOUS) IMPLANT
TRAY FOLEY BAG SILVER LF 14FR (CATHETERS) ×3 IMPLANT
TRAY FOLEY MTR SLVR 16FR STAT (SET/KITS/TRAYS/PACK) IMPLANT
UNDERPAD 30X30 (UNDERPADS AND DIAPERS) ×3 IMPLANT
WATER STERILE IRR 1000ML POUR (IV SOLUTION) ×3 IMPLANT

## 2018-01-04 NOTE — Transfer of Care (Signed)
Immediate Anesthesia Transfer of Care Note  Patient: Candice [name]  Procedure(s) Performed: XI ROBOTIC ASSISTED TOTAL HYSTERECTOMY WITH BILATERAL SALPINGO OOPHORECTOMY (Bilateral )  Patient Location: PACU  Anesthesia Type:General  Level of Consciousness: awake, alert  and patient cooperative  Airway & Oxygen Therapy: Patient Spontanous Breathing and Patient connected to face mask oxygen  Post-op Assessment: Report given to RN, Post -op Vital signs reviewed and stable and Patient moving all extremities  Post vital signs: Reviewed and stable  Last Vitals:  Vitals Value Taken Time  BP 129/85 01/04/2018 12:45 PM  Temp    Pulse 70 01/04/2018 12:53 PM  Resp 12 01/04/2018 12:53 PM  SpO2 100 % 01/04/2018 12:53 PM  Vitals shown include unvalidated device data.  Last Pain:  Vitals:   01/04/18 0914  TempSrc: Oral         Complications: No apparent anesthesia complications

## 2018-01-04 NOTE — Op Note (Signed)
OPERATIVE NOTE  Date: 01/04/18  Preoperative Diagnosis:  1. Fibroid uterus  2. 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 Fibroid uterus  2. 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   Anesthesia: GETA  Specimens: Uterus with attached cervix, bilateral ovaries, bilateral fallopian tubes  Complications: none  Indication for Procedure:  Patient with estrogen sensitive breast cancer desiring definitive hormone suppression. Fibroid uterus with mass effect.  Operative Findings: Large fibroid uterus, bilateral normal appearing ovaries. Despite previous myomectomy, minimal adhesive disease.  Gross pathology assessment revealed no areas of concern. Defer to permanent.  Procedure in Detail:  After induction of anesthesia, the patient was placed in dorsolithotomy position. Her arms were tucked to her side with all appropriate precautions.  The patient was placed in the dorsolithotomy position in [place].  The perineum was prepped with Betadine.    Time out was performed.   Foley catheter was placed by me.  A sterile speculum was placed in the vagina.  The cervix was grasped with a single-tooth tenaculum and sounded to 8cm. RUMI II 8.0cm uterine manipulator with a 3.0 colpotomizer ring was placed without difficulty.    OG tube placement was confirmed and to suction.  Next, a 10 mm skin incision was made 2 cm below the subcostal margin just medial to the midclavicular line.  The 5 mm Optiview port and scope was used for direct entry.  Opening pressure was  under 10 mm CO2.  The abdomen was insufflated and the findings were noted as above.   At this point and all points during the procedure, the patient's intra-abdominal pressure did not exceed 15 mmHg. Next, an 8 mm skin incision was made at the umbilicus and a right and left port was placed about 6-8 cm lateral to the umbilical port on the right and left side.  A fourth arm was placed in the right lateral sidewall about 6-8cm lateral to the last. All ports were placed under direct visualization.  The patient was placed in steep Trendelenburg.  Bowel was placed gently into the upper abdomen.  The robot was docked in the normal manner.  The hysterectomy was started after the round ligament on the right side was incised and the retroperitoneum was entered and the pararectal space was developed.  The ureter was noted to be on the medial leaf of the broad ligament.  The peritoneum above the ureter was incised and stretched and the infundibulopelvic ligament was skeletonized, cauterized and cut.  The posterior peritoneum was taken down to the level of the KOH ring.  The anterior peritoneum was also taken down.  The bladder flap was created to the level of the KOH ring.  The uterine artery on the right side was skeletonized, cauterized and cut in the normal manner.  A similar procedure was performed on the left.  The colpotomy was made and the uterus, cervix, bilateral ovaries and tubes were amputated and delivered through the vagina with mild difficulty.  Pedicles were inspected and excellent hemostasis was achieved.  The vaginal cuff appeared intact.  The colpotomy at the vaginal cuff was closed with 0 V-loc suture x 1. Irrigation was used and excellent hemostasis was achieved.  Gross pathology showed no obvious areas of concern.   Robotic instruments were removed under direct visulaization.  The robot was undocked. The 10 mm port fascia was closed with 0 Vicryl on a UR-6 needle.  The dermis was closed with 4-0  Vicryl in an interuppted fashion. The skin was closed with 4-0 monocryl in a subcuticular manner.  Dermabond was applied.    The vagina was swabbed with some bright red blood, but speculum exam revealed no defects or active bleeding.   Instrument, sponge, lap and needle counts correct x 2.           Disposition: PACU -  Extubated.  Condition: stable

## 2018-01-04 NOTE — Anesthesia Postprocedure Evaluation (Signed)
Anesthesia Post Note  Patient: Candice [name]  Procedure(s) Performed: XI ROBOTIC ASSISTED TOTAL HYSTERECTOMY WITH BILATERAL SALPINGO OOPHORECTOMY (Bilateral )     Patient location during evaluation: PACU Anesthesia Type: General Level of consciousness: awake and alert Pain management: pain level controlled Vital Signs Assessment: post-procedure vital signs reviewed and stable Respiratory status: spontaneous breathing, nonlabored ventilation and respiratory function stable Cardiovascular status: blood pressure returned to baseline and stable Postop Assessment: no apparent nausea or vomiting Anesthetic complications: no    Last Vitals:  Vitals:   01/04/18 1245 01/04/18 1300  BP: 129/85 (!) 143/87  Pulse: 73 (!) 57  Resp: 18 19  Temp: (!) 36 C   SpO2: 100% 100%    Last Pain:  Vitals:   01/04/18 1245  TempSrc:   PainSc: (P) 0-No pain                 Brennan Bailey

## 2018-01-04 NOTE — Progress Notes (Signed)
S: Patient underwent Roboassist TLH/BSO 01/04/18. Final pathology : Uterus, ovaries and fallopian tubes, and cervix - UTERUS: -ENDOMETRIUM: INACTIVE ENDOMETRIUM. NO HYPERPLASIA OR MALIGNANCY. -MYOMETRIUM: LEIOMYOMATA. NO MALIGNANCY. -SEROSA: FIBROUS ADHESIONS. NO MALIGNANCY. - CERVIX: BENIGN SQUAMOUS AND ENDOCERVICAL MUCOSA. NO DYSPLASIA OR MALIGNANCY. - BILATERAL OVARIES: CYSTIC FOLLICLE. NO MALIGNANCY. - BILATERAL FALLOPIAN TUBES: UNREMARKABLE.  She returns for an early postop check and pathology review.    O:  Vitals:   01/11/18 1305  BP: 121/78  Pulse: 74  Resp: 18  Temp: 98.2 F (36.8 C)  SpO2: 100%    General :  Well developed, 42 y.o., female in no apparent distress HEENT:  Normocephalic/atraumatic, symmetric, EOMI, eyelids normal Neck:   No visible masses.  Respiratory:  Respirations unlabored, no use of accessory muscles CV:   Deferred Breast:  Deferred Musculoskeletal: Normal muscle strength. Abdomen:  Wounds are CDI No visible masses or protrusion Extremities:  No visible edema or deformities Skin:   Normal inspection Neuro/Psych:  No focal motor deficit, no abnormal mental status. Normal gait. Normal affect. Alert and oriented to person, place, and time    A/P: 1. Activity restrictions were discussed. 2. She was given a copy of her operative and pathology reports 3. RTC one month for a vaginal cuff check   Mart Piggs, MD Gynecologic Oncologist  Cc: Raiford Simmonds, MD (PCP) Lurline Del, MD (Medical Oncology)

## 2018-01-04 NOTE — Interval H&P Note (Signed)
History and Physical Interval Note:  01/04/2018 10:05 AM  Candice [name]  has presented today for surgery, with the diagnosis of FIBROIDS,ESTROGEN SENSITIVE BREAST CANCER  The various methods of treatment have been discussed with the patient and family. After consideration of risks, benefits and other options for treatment, the patient has consented to  Procedure(s): XI ROBOTIC ASSISTED TOTAL HYSTERECTOMY WITH BILATERAL SALPINGO OOPHORECTOMY (Bilateral) LAPAROTOMY WITH POSSIBLE STAGING (N/A) as a surgical intervention .  The patient's history has been reviewed, patient examined, no change in status, stable for surgery.  I have reviewed the patient's chart and labs.  Questions were answered to the patient's satisfaction.     Isabel Caprice

## 2018-01-04 NOTE — Anesthesia Procedure Notes (Signed)
Procedure Name: Intubation Date/Time: 01/04/2018 10:48 AM Performed by: Mitzie Na, CRNA Pre-anesthesia Checklist: Patient identified, Emergency Drugs available, Suction available, Patient being monitored and Timeout performed Patient Re-evaluated:Patient Re-evaluated prior to induction Oxygen Delivery Method: Circle system utilized Preoxygenation: Pre-oxygenation with 100% oxygen Induction Type: IV induction Ventilation: Two handed mask ventilation required Laryngoscope Size: Mac and 3 Grade View: Grade I Tube type: Oral Tube size: 7.0 mm Number of attempts: 1 Airway Equipment and Method: Stylet Placement Confirmation: ETT inserted through vocal cords under direct vision,  positive ETCO2 and breath sounds checked- equal and bilateral Secured at: 23 cm Tube secured with: Tape Dental Injury: Teeth and Oropharynx as per pre-operative assessment

## 2018-01-05 ENCOUNTER — Encounter (HOSPITAL_COMMUNITY): Payer: Self-pay | Admitting: Obstetrics

## 2018-01-05 DIAGNOSIS — D251 Intramural leiomyoma of uterus: Secondary | ICD-10-CM | POA: Diagnosis not present

## 2018-01-05 LAB — BASIC METABOLIC PANEL
Anion gap: 8 (ref 5–15)
BUN: 9 mg/dL (ref 6–20)
CO2: 26 mmol/L (ref 22–32)
Calcium: 9.6 mg/dL (ref 8.9–10.3)
Chloride: 109 mmol/L (ref 98–111)
Creatinine, Ser: 0.77 mg/dL (ref 0.44–1.00)
GFR calc Af Amer: >60 mL/min (ref >60)
GFR calc non Af Amer: >60 mL/min (ref >60)
Glucose, Bld: 131 mg/dL — ABNORMAL HIGH (ref 70–99)
Potassium: 4.4 mmol/L (ref 3.5–5.1)
Sodium: 143 mmol/L (ref 135–145)

## 2018-01-05 LAB — CBC
HCT: 43 % (ref 36.0–46.0)
Hemoglobin: 14.6 g/dL (ref 12.0–15.0)
MCH: 31.7 pg (ref 26.0–34.0)
MCHC: 34 g/dL (ref 30.0–36.0)
MCV: 93.3 fL (ref 80.0–100.0)
Platelets: 191 10*3/uL (ref 150–400)
RBC: 4.61 MIL/uL (ref 3.87–5.11)
RDW: 11.9 % (ref 11.5–15.5)
WBC: 11.3 10*3/uL — ABNORMAL HIGH (ref 4.0–10.5)
nRBC: 0 % (ref 0.0–0.2)

## 2018-01-05 MED ORDER — IBUPROFEN 800 MG PO TABS: 800.0000 mg | ORAL_TABLET | Freq: Three times a day (TID) | ORAL | 0 refills | Status: DC | PRN

## 2018-01-05 MED ORDER — SENNOSIDES-DOCUSATE SODIUM 8.6-50 MG PO TABS: 1.0000 | ORAL_TABLET | Freq: Every evening | ORAL | 0 refills | Status: DC | PRN

## 2018-01-05 MED ORDER — HYDROCODONE-ACETAMINOPHEN 5-325 MG PO TABS: 1.0000 | ORAL_TABLET | Freq: Four times a day (QID) | ORAL | 0 refills | Status: DC | PRN

## 2018-01-05 NOTE — Progress Notes (Signed)
Went over discharge instructions with patient and mother present.  Patient verbalized understanding of discharge instructions and medications by teach back method.  We discussed new medications and incision care, as well as lifting and exercising restrictions.  Patient verbalized no additional questions.  Patient driven home by her mother.

## 2018-01-05 NOTE — Discharge Instructions (Addendum)
01/04/2018  Return to work: 4 weeks  Activity: 1. Be up and out of the bed during the day.  Take a nap if needed.  You may walk up steps but be careful and use the hand rail.  Stair climbing will tire you more than you think, you may need to stop part way and rest.   2. No lifting or straining for 6 weeks.  3. No driving for 1 weeks.  Do Not drive if you are taking narcotic pain medicine.  4. Shower daily.  Use soap and water on your incision and pat dry; don't rub.   5. No sexual activity and nothing in the vagina for 12 weeks.  Medications:  - Take ibuprofen and tylenol first line for pain control. Take these regularly (every 6 hours) to decrease the build up of pain.  - If necessary, for severe pain not relieved by ibuprofen, take hydrocodone/APAP.  - While taking hydrocodone/APAP you should take sennakot every night to reduce the likelihood of constipation. If this causes diarrhea, stop its use.  -MONITOR YOUR TYLENOL INTAKE SINCE HYDROCODONE/APAP HAS TYLENOL IN IT AS WELL.  MAX OF 4000 MG IN A 24 HOUR PERIOD.  -Take either ibuprofen OR naproxen (previous home med) since they work similarly.   Diet: 1. Low sodium Heart Healthy Diet is recommended.  2. It is safe to use a laxative if you have difficulty moving your bowels.   Wound Care: 1. Keep clean and dry.  Shower daily.  Reasons to call the Doctor:   Fever - Oral temperature greater than 100.4 degrees Fahrenheit  Foul-smelling vaginal discharge  Difficulty urinating  Nausea and vomiting  Increased pain at the site of the incision that is unrelieved with pain medicine.  Difficulty breathing with or without chest pain  New calf pain especially if only on one side  Sudden, continuing increased vaginal bleeding with or without clots.   Follow-up: 1. See Dr Gerarda Fraction in 2-4 weeks.  Contacts: For questions or concerns you should contact:  Dr. Gerarda Fraction at [phone]  After hours and on week-ends call [phone] and ask to speak to the physician on call for Gynecologic Oncology  Acetaminophen; Hydrocodone tablets or capsules What is this medicine? ACETAMINOPHEN; HYDROCODONE (a set a MEE noe fen; hye droe KOE done) is a pain reliever. It is used to treat moderate to severe pain. This medicine may be used for other purposes; ask your health care provider or pharmacist if you have questions. COMMON BRAND NAME(S): Anexsia, Bancap HC, Ceta-Plus, Co-Gesic, Comfortpak, Dolagesic, [employer], DuoCet, Hydrocet, Hydrogesic, Sims, Lorcet HD, Lorcet Plus, Lortab, Margesic H, Maxidone, [place], Polygesic, [place], [place], [employer] [employer], Vicodin, Vicodin ES, Vicodin HP, Charlane Ferretti What should I tell my health care provider before I take this medicine? They need to know if you have any of these conditions: -brain tumor -Crohn's disease, inflammatory bowel disease, or ulcerative colitis -drug abuse or addiction -head injury -heart or circulation problems -if you often drink alcohol -kidney disease or problems going to the bathroom -liver disease -lung disease, asthma, or breathing problems -an unusual or allergic reaction to acetaminophen, hydrocodone, other opioid analgesics, other medicines, foods, dyes, or preservatives -pregnant or trying to get pregnant -breast-feeding How should I use this medicine? Take this medicine by mouth with a glass of water. Follow the directions on the prescription label. You can take it with or without food. If it upsets your stomach, take it with food. Do not take your medicine more often  than directed. A special MedGuide will be given to you by the pharmacist with each prescription and refill. Be sure to read this information carefully each time. Talk to your pediatrician regarding the use of this medicine in children. Special care may be needed. Overdosage: If you think you have taken too much of this medicine contact a poison control center or emergency room at  once. NOTE: This medicine is only for you. Do not share this medicine with others. What if I miss a dose? If you miss a dose, take it as soon as you can. If it is almost time for your next dose, take only that dose. Do not take double or extra doses. What may interact with this medicine? This medicine may interact with the following medications: -alcohol -antiviral medicines for HIV or AIDS -atropine -antihistamines for allergy, cough and cold -certain antibiotics like erythromycin, clarithromycin -certain medicines for anxiety or sleep -certain medicines for bladder problems like oxybutynin, tolterodine -certain medicines for depression like amitriptyline, fluoxetine, sertraline -certain medicines for fungal infections like ketoconazole and itraconazole -certain medicines for Parkinson's disease like benztropine, trihexyphenidyl -certain medicines for seizures like carbamazepine, phenobarbital, phenytoin, primidone -certain medicines for stomach problems like dicyclomine, hyoscyamine -certain medicines for travel sickness like scopolamine -general anesthetics like halothane, isoflurane, methoxyflurane, propofol -ipratropium -local anesthetics like lidocaine, pramoxine, tetracaine -MAOIs like Carbex, Eldepryl, Marplan, Nardil, and Parnate -medicines that relax muscles for surgery -other medicines with acetaminophen -other narcotic medicines for pain or cough -phenothiazines like chlorpromazine, mesoridazine, prochlorperazine, thioridazine -rifampin This list may not describe all possible interactions. Give your health care provider a list of all the medicines, herbs, non-prescription drugs, or dietary supplements you use. Also tell them if you smoke, drink alcohol, or use illegal drugs. Some items may interact with your medicine. What should I watch for while using this medicine? Tell your doctor or health care professional if your pain does not go away, if it gets worse, or if you have  new or a different type of pain. You may develop tolerance to the medicine. Tolerance means that you will need a higher dose of the medicine for pain relief. Tolerance is normal and is expected if you take the medicine for a long time. Do not suddenly stop taking your medicine because you may develop a severe reaction. Your body becomes used to the medicine. This does NOT mean you are addicted. Addiction is a behavior related to getting and using a drug for a non-medical reason. If you have pain, you have a medical reason to take pain medicine. Your doctor will tell you how much medicine to take. If your doctor wants you to stop the medicine, the dose will be slowly lowered over time to avoid any side effects. There are different types of narcotic medicines (opiates). If you take more than one type at the same time or if you are taking another medicine that also causes drowsiness, you may have more side effects. Give your health care provider a list of all medicines you use. Your doctor will tell you how much medicine to take. Do not take more medicine than directed. Call emergency for help if you have problems breathing or unusual sleepiness. Do not take other medicines that contain acetaminophen with this medicine. Always read labels carefully. If you have questions, ask your doctor or pharmacist. If you take too much acetaminophen get medical help right away. Too much acetaminophen can be very dangerous and cause liver damage. Even if you do not have  symptoms, it is important to get help right away. You may get drowsy or dizzy. Do not drive, use machinery, or do anything that needs mental alertness until you know how this medicine affects you. Do not stand or sit up quickly, especially if you are an older patient. This reduces the risk of dizzy or fainting spells. Alcohol may interfere with the effect of this medicine. Avoid alcoholic drinks. The medicine will cause constipation. Try to have a bowel  movement at least every 2 to 3 days. If you do not have a bowel movement for 3 days, call your doctor or health care professional. Your mouth may get dry. Chewing sugarless gum or sucking hard candy, and drinking plenty of water may help. Contact your doctor if the problem does not go away or is severe. What side effects may I notice from receiving this medicine? Side effects that you should report to your doctor or health care professional as soon as possible: -allergic reactions like skin rash, itching or hives, swelling of the face, lips, or tongue -breathing problems -confusion -redness, blistering, peeling or loosening of the skin, including inside the mouth -signs and symptoms of low blood pressure like dizziness; feeling faint or lightheaded, falls; unusually weak or tired -trouble passing urine or change in the amount of urine -yellowing of the eyes or skin Side effects that usually do not require medical attention (report to your doctor or health care professional if they continue or are bothersome): -constipation -dry mouth -nausea, vomiting -tiredness This list may not describe all possible side effects. Call your doctor for medical advice about side effects. You may report side effects to FDA at 1-800-FDA-1088. Where should I keep my medicine? Keep out of the reach of children. This medicine can be abused. Keep your medicine in a safe place to protect it from theft. Do not share this medicine with anyone. Selling or giving away this medicine is dangerous and against the law. This medicine may cause accidental overdose and death if it taken by other adults, children, or pets. Mix any unused medicine with a substance like cat litter or coffee grounds. Then throw the medicine away in a sealed container like a sealed bag or a coffee can with a lid. Do not use the medicine after the expiration date. Store at room temperature between 15 and 30 degrees C (59 and 86 degrees F). NOTE: This  sheet is a summary. It may not cover all possible information. If you have questions about this medicine, talk to your doctor, pharmacist, or health care provider.  2018 Elsevier/Gold Standard (2014-12-05 10:02:16)

## 2018-01-05 NOTE — Discharge Summary (Signed)
Physician Discharge Summary  Patient ID: Candice [name] MRN: [id number] DOB/AGE: May 18, 1975 42 y.o.  Admit date: 01/04/2018 Discharge date: 01/05/2018  Admission Diagnoses: Fibroid, uterine  Discharge Diagnoses:  Principal Problem:   Fibroid, uterine Active Problems:   Malignant neoplasm of upper-inner quadrant of right breast in female, estrogen receptor positive (Candice [name])   Fibroid uterus   Discharged Condition:  The patient is in good condition and stable for discharge.   Hospital Course: On 01/04/2018, the patient underwent the following: Procedure(s): XI ROBOTIC ASSISTED TOTAL HYSTERECTOMY WITH BILATERAL SALPINGO OOPHORECTOMY.  The postoperative course was uneventful.  She was discharged to home on postoperative day 1 tolerating a regular diet, ambulating without difficulty, pain controlled, voiding.  Consults: None  Significant Diagnostic Studies: see above  Treatments: surgery: see above  Discharge Exam: Blood pressure 107/63, pulse (!) 48, temperature 98.7 F (37.1 C), temperature source Oral, resp. rate 18, height 5\' 4"  (1.626 m), weight 171 lb 4.8 oz (77.7 kg), SpO2 100 %. General appearance: alert, cooperative and no distress Resp: clear to auscultation bilaterally Cardio: regular rate and rhythm, S1, S2 normal, no murmur, click, rub or gallop GI: soft, non-tender; bowel sounds normal; no masses,  no organomegaly Extremities: extremities normal, atraumatic, no cyanosis or edema Incision/Wound: Lap sites to the abdomen with dermabond without erythema or drainage  Disposition: Discharge disposition: 01-Home or Self Care       Discharge Instructions    Call MD for:  difficulty breathing, headache or visual disturbances   Complete by:  As directed    Call MD for:  extreme fatigue   Complete by:  As directed    Call MD for:  hives   Complete by:  As directed    Call MD for:  persistant dizziness or light-headedness   Complete by:  As directed    Call MD  for:  persistant nausea and vomiting   Complete by:  As directed    Call MD for:  redness, tenderness, or signs of infection (pain, swelling, redness, odor or green/yellow discharge around incision site)   Complete by:  As directed    Call MD for:  severe uncontrolled pain   Complete by:  As directed    Call MD for:  temperature >100.4   Complete by:  As directed    Diet - low sodium heart healthy   Complete by:  As directed    Driving Restrictions   Complete by:  As directed    No driving for 1 week.  Do not take narcotics and drive.   Increase activity slowly   Complete by:  As directed    Lifting restrictions   Complete by:  As directed    No lifting greater than 10 lbs.   Sexual Activity Restrictions   Complete by:  As directed    No sexual activity, nothing in the vagina, for 12 weeks.     Allergies as of 01/05/2018      Reactions   Paclitaxel Itching   Amoxicillin Rash   Has patient had a PCN reaction causing immediate rash, facial/tongue/throat swelling, SOB or lightheadedness with hypotension: Yes Has patient had a PCN reaction causing severe rash involving mucus membranes or skin necrosis: Yes Has patient had a PCN reaction that required hospitalization: Yes Has patient had a PCN reaction occurring within the last 10 years: No If all of the above answers are "NO", then may proceed with Cephalosporin use.      Medication List    STOP  taking these medications   goserelin 10.8 MG injection Commonly known as:  ZOLADEX     TAKE these medications   anastrozole 1 MG tablet Commonly known as:  ARIMIDEX Take 1 tablet (1 mg total) by mouth daily.   gabapentin 300 MG capsule Commonly known as:  NEURONTIN Take 1 capsule (300 mg total) by mouth at bedtime.   HAIR SKIN AND NAILS FORMULA Tabs Take 1 tablet by mouth 2 (two) times daily.   HYDROcodone-acetaminophen 5-325 MG tablet Commonly known as:  NORCO/VICODIN Take 1 tablet by mouth every 6 (six) hours as needed for  severe pain.   ibuprofen 800 MG tablet Commonly known as:  ADVIL,MOTRIN Take 1 tablet (800 mg total) by mouth every 8 (eight) hours as needed for moderate pain.   naproxen 500 MG tablet Commonly known as:  NAPROSYN Take 500 mg by mouth 2 (two) times daily as needed for pain.   senna-docusate 8.6-50 MG tablet Commonly known as:  Senokot-S Take 1 tablet by mouth at bedtime as needed for mild constipation or moderate constipation.      Follow-up Information    Isabel Caprice, MD Follow up on 01/11/2018.   Specialty:  Gynecologic Oncology Why:  at 1:15pm at the [facility] for post-op check Contact information: [address] [place] [address] [phone]           Greater than thirty minutes were spend for face to face discharge instructions and discharge orders/summary in EPIC.   Signed: Dorothyann Gibbs 01/05/2018, 8:48 AM

## 2018-01-10 ENCOUNTER — Inpatient Hospital Stay: Payer: Medicaid Other

## 2018-01-10 MED ORDER — GOSERELIN ACETATE 3.6 MG ~~LOC~~ IMPL
DRUG_IMPLANT | SUBCUTANEOUS | Status: AC
  Filled 2018-01-10: qty 3.6

## 2018-01-11 ENCOUNTER — Inpatient Hospital Stay (HOSPITAL_BASED_OUTPATIENT_CLINIC_OR_DEPARTMENT_OTHER): Payer: Medicaid Other | Admitting: Obstetrics

## 2018-01-11 ENCOUNTER — Encounter: Payer: Self-pay | Admitting: Obstetrics

## 2018-01-11 VITALS — BP 121/78 | HR 74 | Temp 98.2°F | Resp 18 | Ht 64.0 in | Wt 160.0 lb

## 2018-01-11 DIAGNOSIS — Z90722 Acquired absence of ovaries, bilateral: Secondary | ICD-10-CM

## 2018-01-11 DIAGNOSIS — Z170 Estrogen receptor positive status [ER+]: Secondary | ICD-10-CM

## 2018-01-11 DIAGNOSIS — D251 Intramural leiomyoma of uterus: Secondary | ICD-10-CM

## 2018-01-11 DIAGNOSIS — Z90710 Acquired absence of both cervix and uterus: Secondary | ICD-10-CM

## 2018-01-11 DIAGNOSIS — C50911 Malignant neoplasm of unspecified site of right female breast: Secondary | ICD-10-CM

## 2018-01-11 DIAGNOSIS — Z191 Hormone sensitive malignancy status: Secondary | ICD-10-CM

## 2018-01-11 NOTE — Patient Instructions (Signed)
1. RTC one month for vaginal exam 2. Activity restrictions as discussed.

## 2018-02-03 ENCOUNTER — Other Ambulatory Visit: Payer: Self-pay | Admitting: Nurse Practitioner

## 2018-02-05 NOTE — Progress Notes (Signed)
[name]  [phone] [phone]     ID: Candice [name] DOB: Aug 07, 1975  MR#: [id number]  GSU#:[id number]  Patient Care Team: Jonathon Jordan, MD as PCP - General (Family Medicine) Alphonsa Overall, MD as Consulting Physician (General Surgery) Magrinat, Virgie Dad, MD as Consulting Physician (Oncology) Eppie Gibson, MD as Attending Physician (Radiation Oncology) Cheryll Cockayne, MD as Referring Physician (Surgical Oncology) Delice Bison, Charlestine Massed, NP as Nurse Practitioner (Hematology and Oncology) Isabel Caprice, MD as Consulting Physician (Gynecologic Oncology) OTHER MD:  CHIEF COMPLAINT: estrogen receptor positive breast cancer  CURRENT TREATMENT:  anastrozole   BREAST CANCER HISTORY: From the original intake note:  Candice [name] [facility] [place] in December, at which time a mass in the right breast was noted. She was referred for [facility]CP and screening mammography was obtained, showing a possible mass in the right breast.on 04/21/2016 she underwent right diagnostic mammography with tomography and ultrasonography at the breast Center, and this confirmedan irregular spiculat in the right breast measuring 1.8 cm, which was not palpable to the mammographer. Ultrasonography confirmed an irregular hypoechoic right breast mass at the 12:30 o'clock position 4 cm from the nipple measuring 2.2 cm. The right axilla was sonographically benign.  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  Her subsequent history is detailed below  INTERVAL HISTORY: [name] returns today for follow-up and treatment of her estrogen receptor positive breast cancer accompanied by her  sister.   Since her last visit here she underwent laparoscopic hysterectomy with bilateral salpingo-oophorectomy on 01/04/2018.  The pathology ([id number]) showed no hyperplasia or malignancy  She continues on anastrozole, with good tolerance, but experiences hot flashes and night sweats since she has had hysterectomy. She gets them at all times of the day and has vaginal dryness.  REVIEW OF SYSTEMS: Candice [name] reports that for exercise, back to going to the gym but cannot lift weights yet but is excited to have clearance when she goes to see Dr. Gerarda Fraction.  She denies unusual headaches, visual changes, nausea, vomiting, or dizziness. There has been no unusual cough, phlegm production, or pleurisy. This been no change in bowel or bladder habits. She denies unexplained fatigue or unexplained weight loss, bleeding, rash, or fever. A detailed review of systems was otherwise stable.  PAST MEDICAL HISTORY: Past Medical History:  Diagnosis Date  . Breast cancer ([place]) 05/2016   right  . Chronic constipation   . Family history of cancer   . Fibroid, uterine   . IBS (irritable bowel syndrome)    IBS - C (constipation)  . Personal history of chemotherapy   . Personal history of radiation therapy     PAST SURGICAL HISTORY: Past Surgical History:  Procedure Laterality Date  . BARTHOLIN CYST MARSUPIALIZATION  09/04/2002  . BARTHOLIN GLAND CYST EXCISION Left 07/13/2005  . BREAST LUMPECTOMY WITH RADIOACTIVE SEED AND SENTINEL LYMPH NODE BIOPSY Right 06/07/2016   Procedure: BREAST LUMPECTOMY WITH RADIOACTIVE SEED AND SENTINEL LYMPH NODE BIOPSY;  Surgeon: Alphonsa Overall, MD;  Location: [facility];  Service: General;  Laterality: Right;  . HYSTEROSCOPY  2016  . MYOMECTOMY  07/20/2004   Laparotomy  . OVARIAN CYST REMOVAL Right 07/20/2004  . PORTACATH PLACEMENT N/A 07/05/2016  REMOVED Fall 2018 ; Procedure: [place] WITH [place];  Surgeon: Alphonsa Overall, MD;  Location: [facility];  Service: General;  Laterality: N/A;  . ROBOTIC ASSISTED TOTAL HYSTERECTOMY WITH BILATERAL SALPINGO OOPHERECTOMY Bilateral 01/04/2018   Procedure: XI ROBOTIC ASSISTED TOTAL HYSTERECTOMY WITH BILATERAL SALPINGO OOPHORECTOMY;  Surgeon: Isabel Caprice, MD;  Location: [facility];  Service: Gynecology;  Laterality: Bilateral;    FAMILY HISTORY Family History  Problem Relation Age of Onset  . Breast cancer Mother 70  . Pancreatic cancer Paternal Grandmother   . Prostate cancer Paternal Grandfather   . Ovarian cancer Other        MGMs sister  . Breast cancer Other        mother's maternal first cousin  The patient's father, Candice [name], lives in [place] and works as a Geneticist, molecular. The patient's mother is a Quarry manager. The patient has no brothers and 1 sister, who works in [place] as a Network engineer. The patient's mother had breast cancer, stage 0, diagnosed at age 90. There are 2 maternal relatives (mother's sister and mother's niece) with breast cancer, both postmenopausal.  GYNECOLOGIC HISTORY:  Menarche age 15, the patient is GX P0. No LMP recorded. (Menstrual status: Chemotherapy).   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  She is applying for a full-time position.  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 She generally lives in [place] by herself although she is currently staying at her parents.    ADVANCED DIRECTIVES: Not in place. The patient tells me she intends to name her sister as her healthcare power of attorney.   HEALTH MAINTENANCE: Social History   Tobacco Use  . Smoking status: Never Smoker  . Smokeless tobacco: Never Used  Substance Use Topics  . Alcohol use: Yes    Alcohol/week: 1.0 standard drinks    Types: 1 Glasses of wine per week    Comment: once per month  . Drug use: No     Colonoscopy: July 2016, in  [place]  PAP:  Bone density: Never   Allergies  Allergen Reactions  . Paclitaxel Itching  . Amoxicillin Rash    Has patient had a PCN reaction causing immediate rash, facial/tongue/throat swelling, SOB or lightheadedness with hypotension: Yes Has patient had a PCN reaction causing severe rash involving mucus membranes or skin necrosis: Yes Has patient had a PCN reaction that required hospitalization: Yes Has patient had a PCN reaction occurring within the last 10 years: No If all of the above answers are "NO", then may proceed with Cephalosporin use.     Current Outpatient Medications  Medication Sig Dispense Refill  . anastrozole (ARIMIDEX) 1 MG tablet Take 1 tablet (1 mg total) by mouth daily. 30 tablet 2  . gabapentin (NEURONTIN) 300 MG capsule Take 1 capsule (300 mg total) by mouth at bedtime. 90 capsule 4  . venlafaxine XR (EFFEXOR-XR) 37.5 MG 24 hr capsule Take 1 capsule (37.5 mg total) by mouth daily with breakfast. 60 capsule 6   No current facility-administered medications for this visit.     OBJECTIVE: young African-American woman in no acute distress  Vitals:   02/07/18 1130  BP: 110/85  Pulse: 85  Resp: 18  Temp: 98.3 F (36.8 C)  SpO2: 100%     Body mass index is 27.19 kg/m.    ECOG FS: 1 - Symptomatic  but completely ambulatory Filed Weights   02/07/18 1130  Weight: 158 lb 6.4 oz (71.8 kg)    Sclerae unicteric, pupils round and equal Oropharynx clear and moist No cervical or supraclavicular adenopathy Lungs no rales or rhonchi Heart regular rate and rhythm Abd soft, nontender, positive bowel sounds, no masses palpated MSK no focal spinal tenderness, no upper extremity lymphedema Neuro: nonfocal, well oriented, appropriate affect Breasts: The right breast has undergone lumpectomy and radiation.  There is no evidence of disease recurrence.  The left breast is unremarkable.  Both axillae are benign.  LAB RESULTS:  CMP     Component Value Date/Time    NA 145 02/07/2018 1110   NA 142 02/15/2017 1054   K 4.2 02/07/2018 1110   K 4.1 02/15/2017 1054   CL 108 02/07/2018 1110   CO2 30 02/07/2018 1110   CO2 25 02/15/2017 1054   GLUCOSE 81 02/07/2018 1110   GLUCOSE 71 02/15/2017 1054   BUN 13 02/07/2018 1110   BUN 12.3 02/15/2017 1054   CREATININE 1.17 (H) 02/07/2018 1110   CREATININE 0.9 02/15/2017 1054   CALCIUM 9.7 02/07/2018 1110   CALCIUM 9.6 02/15/2017 1054   PROT 7.3 02/07/2018 1110   PROT 7.0 02/15/2017 1054   ALBUMIN 4.2 02/07/2018 1110   ALBUMIN 3.8 02/15/2017 1054   AST 22 02/07/2018 1110   AST 25 02/15/2017 1054   ALT 13 02/07/2018 1110   ALT 15 02/15/2017 1054   ALKPHOS 73 02/07/2018 1110   ALKPHOS 62 02/15/2017 1054   BILITOT 0.8 02/07/2018 1110   BILITOT 0.60 02/15/2017 1054   GFRNONAA 57 (L) 02/07/2018 1110   GFRAA >60 02/07/2018 1110    INo results found for: SPEP, UPEP  Lab Results  Component Value Date   WBC 3.7 (L) 02/07/2018   NEUTROABS 1.5 (L) 02/07/2018   HGB 14.4 02/07/2018   HCT 42.0 02/07/2018   MCV 91.7 02/07/2018   PLT 209 02/07/2018      Chemistry      Component Value Date/Time   NA 145 02/07/2018 1110   NA 142 02/15/2017 1054   K 4.2 02/07/2018 1110   K 4.1 02/15/2017 1054   CL 108 02/07/2018 1110   CO2 30 02/07/2018 1110   CO2 25 02/15/2017 1054   BUN 13 02/07/2018 1110   BUN 12.3 02/15/2017 1054   CREATININE 1.17 (H) 02/07/2018 1110   CREATININE 0.9 02/15/2017 1054      Component Value Date/Time   CALCIUM 9.7 02/07/2018 1110   CALCIUM 9.6 02/15/2017 1054   ALKPHOS 73 02/07/2018 1110   ALKPHOS 62 02/15/2017 1054   AST 22 02/07/2018 1110   AST 25 02/15/2017 1054   ALT 13 02/07/2018 1110   ALT 15 02/15/2017 1054   BILITOT 0.8 02/07/2018 1110   BILITOT 0.60 02/15/2017 1054       No results found for: LABCA2  No components found for: LABCA125  No results for input(s): INR in the last 168 hours.  Urinalysis    Component Value Date/Time   COLORURINE YELLOW  01/02/2018 1441   APPEARANCEUR HAZY (A) 01/02/2018 1441   LABSPEC 1.018 01/02/2018 1441   PHURINE 5.0 01/02/2018 1441   GLUCOSEU NEGATIVE 01/02/2018 1441   HGBUR NEGATIVE 01/02/2018 1441   BILIRUBINUR NEGATIVE 01/02/2018 1441   KETONESUR NEGATIVE 01/02/2018 1441   PROTEINUR NEGATIVE 01/02/2018 1441   NITRITE NEGATIVE 01/02/2018 1441   LEUKOCYTESUR NEGATIVE 01/02/2018 1441     STUDIES: No results found.   ELIGIBLE FOR AVAILABLE  RESEARCH PROTOCOL: no  ASSESSMENT: 42 y.o. [place] woman currently residing in [place], status post right breast upper inner quadrant biopsy 04/21/2016 for a clinical T2 N0, stage IIa invasive ductal carcinoma, grade 2, estrogen and progesterone receptor positive, HER-2 nonamplified, with an MIB-1 of 20%  (a) biopsy of 2 additional suspicious areas in the right breast 05/12/2016 was benign  (1) started tamoxifen 04/27/2016, Discontinued at the start of chemotherapy  (2) Oncotype DX obtained from the original biopsy showed a score of 14, predicting a 10 year risk of recurrence outside the breast of 9% if the patient's only systemic therapy is tamoxifen for 5 years. It also predicts no benefit from chemotherapy.   (3) patient is not interested in fertility preservation  (4) genetics testing 04/27/2016 through the [facility] gene panel offered by [facility] found no deleterious mutations in APC, ATM, BARD1, BMPR1A, BRCA1, BRCA2, BRIP1, CHD1, CDK4, CDKN2A, CHEK2, EPCAM (large rearrangement only), MLH1, MSH2, MSH6, MUTYH, NBN, PALB2, PMS2, PTEN, RAD51C, RAD51D, SMAD4, STK11, and TP53. Sequencing was performed for select regions of POLE and POLD1, and large rearrangement analysis was performed for select regions of GREM1.   (5) status post right lumpectomy with sentinel lymph node sampling 06/07/2016 for a pT1c pN1, stage IB invasive ductal carcinoma, grade 2, with negative margins   (6) Mammaprint sent from the final surgical  sample was read as high risk, indicating a need for chemotherapy   (7) doxorubicin and cyclophosphamide in dose dense fashion 4  started 07/12/2016, completed 08/23/2016 followed by paclitaxel weekly 12 started 09/06/2016 (received 2 cycles), changed to Abraxane on 09/27/2016 due to reaction to Paclitaxel: Abraxane discontinued after 1 cycle because of the same reaction.  (8) adjuvant radiation 11/01/16-12/19/16 Site/dose:   1. Right Breast: 50 Gy in 25 fractions                          2. Right Breast Boost: 10 Gy in 5 fractions                          3. Right Breast SCV: 50 Gy in 25 fractions    (9) resumed tamoxifen 12/26/2016, discontinued January 2019 in preparation for anastrozole  (10) goserelin started 05/01/2017, stopped 12/13/2017  (11) status post laparoscopic hysterectomy with bilateral salpingo-oophorectomy 01/04/2018 with benign pathology  (12) anastrozole started 06/19/2017  PLAN: [place] did remarkably well with her recent surgery and a wonderful thing is that her abdominal problems have also resolved.  Doubt this they were due to the very large fibroids somehow pressing on her rectum or other parts of her GI tract.  She will be seeing Dr. Gerarda Fraction today and hopefully get clearance regarding additional exercise.  In the meantime Caia's flashes has significantly worsened.  We are restarting the gabapentin which she will take at bedtime.  During the day she will try venlafaxine XR at 37.5 mg.  In a couple of weeks she will call me and let me know if that is working for her.  If not we will double the dose.  I am going to see her again in about 4 months.  Before that visit we will obtain a bone density.  At that time we might consider switching to tamoxifen  She knows to call for any other issues that may develop before the next visit.  Magrinat, Virgie Dad, MD  02/07/18 12:06 PM Medical Oncology and Hematology [address]  [place],  [place] [address] Tel. [phone]    Fax. [phone]    Elie Goody, am acting as scribe for Dr. Virgie Dad. Magrinat.  I, Lurline Del MD, have reviewed the above documentation for accuracy and completeness, and I agree with the above.

## 2018-02-07 ENCOUNTER — Inpatient Hospital Stay (HOSPITAL_BASED_OUTPATIENT_CLINIC_OR_DEPARTMENT_OTHER): Payer: Medicaid Other | Admitting: Obstetrics

## 2018-02-07 ENCOUNTER — Encounter: Payer: Self-pay | Admitting: Obstetrics

## 2018-02-07 ENCOUNTER — Telehealth: Payer: Self-pay | Admitting: Oncology

## 2018-02-07 ENCOUNTER — Inpatient Hospital Stay: Payer: Medicaid Other | Attending: Adult Health

## 2018-02-07 ENCOUNTER — Inpatient Hospital Stay: Payer: Medicaid Other

## 2018-02-07 ENCOUNTER — Telehealth: Payer: Self-pay | Admitting: Hematology and Oncology

## 2018-02-07 ENCOUNTER — Inpatient Hospital Stay (HOSPITAL_BASED_OUTPATIENT_CLINIC_OR_DEPARTMENT_OTHER): Payer: Medicaid Other | Admitting: Oncology

## 2018-02-07 ENCOUNTER — Ambulatory Visit: Payer: Medicaid Other | Admitting: Obstetrics

## 2018-02-07 VITALS — BP 110/85 | HR 85 | Temp 98.3°F | Resp 18 | Ht 64.0 in | Wt 158.4 lb

## 2018-02-07 VITALS — BP 118/71 | HR 71 | Temp 97.5°F | Resp 20 | Ht 64.5 in | Wt 158.4 lb

## 2018-02-07 DIAGNOSIS — Z90722 Acquired absence of ovaries, bilateral: Secondary | ICD-10-CM | POA: Insufficient documentation

## 2018-02-07 DIAGNOSIS — Z79811 Long term (current) use of aromatase inhibitors: Secondary | ICD-10-CM

## 2018-02-07 DIAGNOSIS — C50211 Malignant neoplasm of upper-inner quadrant of right female breast: Secondary | ICD-10-CM

## 2018-02-07 DIAGNOSIS — Z9221 Personal history of antineoplastic chemotherapy: Secondary | ICD-10-CM

## 2018-02-07 DIAGNOSIS — Z90710 Acquired absence of both cervix and uterus: Secondary | ICD-10-CM | POA: Diagnosis not present

## 2018-02-07 DIAGNOSIS — Z170 Estrogen receptor positive status [ER+]: Secondary | ICD-10-CM

## 2018-02-07 DIAGNOSIS — D250 Submucous leiomyoma of uterus: Secondary | ICD-10-CM

## 2018-02-07 DIAGNOSIS — Z79899 Other long term (current) drug therapy: Secondary | ICD-10-CM

## 2018-02-07 LAB — COMPREHENSIVE METABOLIC PANEL
ALT: 13 U/L (ref 0–44)
AST: 22 U/L (ref 15–41)
Albumin: 4.2 g/dL (ref 3.5–5.0)
Alkaline Phosphatase: 73 U/L (ref 38–126)
Anion gap: 7 (ref 5–15)
BUN: 13 mg/dL (ref 6–20)
CO2: 30 mmol/L (ref 22–32)
Calcium: 9.7 mg/dL (ref 8.9–10.3)
Chloride: 108 mmol/L (ref 98–111)
Creatinine, Ser: 1.17 mg/dL — ABNORMAL HIGH (ref 0.44–1.00)
GFR calc Af Amer: >60 mL/min (ref >60)
GFR calc non Af Amer: 57 mL/min — ABNORMAL LOW (ref >60)
Glucose, Bld: 81 mg/dL (ref 70–99)
Potassium: 4.2 mmol/L (ref 3.5–5.1)
Sodium: 145 mmol/L (ref 135–145)
Total Bilirubin: 0.8 mg/dL (ref 0.3–1.2)
Total Protein: 7.3 g/dL (ref 6.5–8.1)

## 2018-02-07 LAB — CBC WITH DIFFERENTIAL/PLATELET
Abs Immature Granulocytes: 0 10*3/uL (ref 0.00–0.07)
Basophils Absolute: 0 10*3/uL (ref 0.0–0.1)
Basophils Relative: 1 %
Eosinophils Absolute: 0.2 10*3/uL (ref 0.0–0.5)
Eosinophils Relative: 4 %
HCT: 42 % (ref 36.0–46.0)
Hemoglobin: 14.4 g/dL (ref 12.0–15.0)
Immature Granulocytes: 0 %
Lymphocytes Relative: 46 %
Lymphs Abs: 1.7 10*3/uL (ref 0.7–4.0)
MCH: 31.4 pg (ref 26.0–34.0)
MCHC: 34.3 g/dL (ref 30.0–36.0)
MCV: 91.7 fL (ref 80.0–100.0)
Monocytes Absolute: 0.3 10*3/uL (ref 0.1–1.0)
Monocytes Relative: 8 %
Neutro Abs: 1.5 10*3/uL — ABNORMAL LOW (ref 1.7–7.7)
Neutrophils Relative %: 41 %
Platelets: 209 10*3/uL (ref 150–400)
RBC: 4.58 MIL/uL (ref 3.87–5.11)
RDW: 12 % (ref 11.5–15.5)
WBC: 3.7 10*3/uL — ABNORMAL LOW (ref 4.0–10.5)
nRBC: 0 % (ref 0.0–0.2)

## 2018-02-07 MED ORDER — GABAPENTIN 300 MG PO CAPS: 300.0000 mg | ORAL_CAPSULE | Freq: Every day | ORAL | 4 refills | Status: DC

## 2018-02-07 MED ORDER — GOSERELIN ACETATE 3.6 MG ~~LOC~~ IMPL
DRUG_IMPLANT | SUBCUTANEOUS | Status: AC
  Filled 2018-02-07: qty 3.6

## 2018-02-07 MED ORDER — ANASTROZOLE 1 MG PO TABS: 1.0000 mg | ORAL_TABLET | Freq: Every day | ORAL | 2 refills | Status: DC

## 2018-02-07 MED ORDER — VENLAFAXINE HCL ER 37.5 MG PO CP24: 37.5000 mg | ORAL_CAPSULE | Freq: Every day | ORAL | 6 refills | Status: DC

## 2018-02-07 NOTE — Telephone Encounter (Signed)
Gave patient avs and calendar.  Per RN, injection appt not needed and patient wanted to cancel.

## 2018-02-07 NOTE — Progress Notes (Signed)
S: Patient underwent Roboassist TLH/BSO 01/04/18. Final pathology : Uterus, ovaries and fallopian tubes, and cervix - UTERUS: -ENDOMETRIUM: INACTIVE ENDOMETRIUM. NO HYPERPLASIA OR MALIGNANCY. -MYOMETRIUM: LEIOMYOMATA. NO MALIGNANCY. -SEROSA: FIBROUS ADHESIONS. NO MALIGNANCY. - CERVIX: BENIGN SQUAMOUS AND ENDOCERVICAL MUCOSA. NO DYSPLASIA OR MALIGNANCY. - BILATERAL OVARIES: CYSTIC FOLLICLE. NO MALIGNANCY. - BILATERAL FALLOPIAN TUBES: UNREMARKABLE.  She returns for an final postop check . She is noticing night sweats/hot flashes quite a bit. Her constipation is improved!    O:  Vitals:   02/07/18 1305  BP: 118/71  Pulse: 71  Resp: 20  Temp: (!) 97.5 F (36.4 C)  SpO2: 100%    General :  Well developed, 42 y.o., female in no apparent distress HEENT:  Normocephalic/atraumatic, symmetric, EOMI, eyelids normal Neck:   No visible masses.  Respiratory:  Respirations unlabored, no use of accessory muscles CV:   Deferred Breast:  Deferred Musculoskeletal: Normal muscle strength. Abdomen:  Wounds are healed. No visible masses or protrusion Extremities:  No visible edema or deformities Skin:   Normal inspection Neuro/Psych:  No focal motor deficit, no abnormal mental status. Normal gait. Normal affect. Alert and oriented to person, place, and time  Genito Urinary: Vulva: Normal external female genitalia.  Bladder/urethra: Urethral meatus normal in size and location. No lesions or   masses, well supported bladder Speculum exam: Vagina: No lesion, no discharge, no bleeding. Bimanual exam: Cervix/Uterus/Adnexa: Surgically absent  Adnexal region: No masses.  Rectovaginal:  deferred    A/P: 1. Activity restrictions were discussed re-reviewed 2. She was previously given a copy of her operative and pathology reports 3. Vaginal dryness - avoid hormone therapy. OK to use Replens. Briefly discussed Mona-Lisa procedure  She would need to see a benign GYN to discuss that procedure  further.  Mart Piggs, MD Gynecologic Oncologist

## 2018-02-07 NOTE — Patient Instructions (Signed)
Activity restrictions as discussed Followup with Dr. Jana Hakim and your primary care provider for routine care

## 2018-02-07 NOTE — Telephone Encounter (Signed)
Per 11/13 los.  Mailed patient calendar with appts.

## 2018-02-26 ENCOUNTER — Ambulatory Visit: Payer: Medicaid Other | Admitting: Gynecologic Oncology

## 2018-03-02 ENCOUNTER — Telehealth (HOSPITAL_COMMUNITY): Payer: Self-pay | Admitting: *Deleted

## 2018-03-02 NOTE — Telephone Encounter (Signed)
Telephoned patient at home number and advised recertification was sent for [facility]. Patient voiced understanding.

## 2018-03-03 ENCOUNTER — Other Ambulatory Visit: Payer: Self-pay | Admitting: Nurse Practitioner

## 2018-03-07 ENCOUNTER — Ambulatory Visit: Payer: Medicaid Other

## 2018-03-12 ENCOUNTER — Ambulatory Visit: Payer: Medicaid Other | Admitting: Obstetrics

## 2018-03-16 ENCOUNTER — Other Ambulatory Visit: Payer: Self-pay | Admitting: Nurse Practitioner

## 2018-03-22 ENCOUNTER — Telehealth: Payer: Self-pay

## 2018-03-22 NOTE — Telephone Encounter (Signed)
LM for patient to call Dr. Gerarda Fraction' office to discuss a time to come in and sign the form so her insurance claim from surgery can be processed.

## 2018-03-27 NOTE — Telephone Encounter (Signed)
LM for patient to contact office to set up a time to sign the hysterectomy form as noted below.

## 2018-03-29 ENCOUNTER — Other Ambulatory Visit: Payer: Self-pay | Admitting: Oncology

## 2018-03-29 ENCOUNTER — Telehealth: Payer: Self-pay

## 2018-03-29 DIAGNOSIS — Z853 Personal history of malignant neoplasm of breast: Secondary | ICD-10-CM

## 2018-03-29 NOTE — Telephone Encounter (Signed)
Incoming call from pt - she [place] and just got our message to come sign hysterectomy form.  Pt arrived, completed form.  No other needs per pt at this time. Reports she is doing well.  Notified Renee with Medicaid approval -faxed completed form to her [phone].

## 2018-03-30 NOTE — Telephone Encounter (Signed)
Pt came in 03-29-18 to sign form.

## 2018-04-19 ENCOUNTER — Ambulatory Visit
Admission: RE | Admit: 2018-04-19 | Discharge: 2018-04-19 | Disposition: A | Discharge status: Home / Self Care | Payer: Medicaid Other | Source: Ambulatory Visit | Attending: Oncology | Admitting: Oncology

## 2018-04-19 ENCOUNTER — Other Ambulatory Visit: Payer: Self-pay | Admitting: Nurse Practitioner

## 2018-04-19 DIAGNOSIS — Z853 Personal history of malignant neoplasm of breast: Secondary | ICD-10-CM

## 2018-04-21 ENCOUNTER — Other Ambulatory Visit: Payer: Self-pay | Admitting: Nurse Practitioner

## 2018-04-25 ENCOUNTER — Encounter (HOSPITAL_COMMUNITY): Payer: Self-pay | Admitting: *Deleted

## 2018-04-27 ENCOUNTER — Other Ambulatory Visit: Payer: Self-pay | Admitting: Nurse Practitioner

## 2018-05-03 ENCOUNTER — Ambulatory Visit: Payer: Medicaid Other | Admitting: Physical Therapy

## 2018-05-03 ENCOUNTER — Other Ambulatory Visit: Payer: Self-pay

## 2018-05-03 ENCOUNTER — Ambulatory Visit: Payer: Medicaid Other | Attending: Orthopedic Surgery | Admitting: Physical Therapy

## 2018-05-03 ENCOUNTER — Encounter: Payer: Self-pay | Admitting: Physical Therapy

## 2018-05-03 DIAGNOSIS — M62838 Other muscle spasm: Secondary | ICD-10-CM | POA: Insufficient documentation

## 2018-05-03 DIAGNOSIS — M25561 Pain in right knee: Secondary | ICD-10-CM | POA: Diagnosis not present

## 2018-05-03 DIAGNOSIS — M6281 Muscle weakness (generalized): Secondary | ICD-10-CM | POA: Insufficient documentation

## 2018-05-03 DIAGNOSIS — G8929 Other chronic pain: Secondary | ICD-10-CM

## 2018-05-03 DIAGNOSIS — M25562 Pain in left knee: Secondary | ICD-10-CM | POA: Diagnosis present

## 2018-05-03 NOTE — Therapy (Signed)
[facility] [address] [place], [place], [address] Phone: [phone]   Fax:  [phone]  Physical Therapy Evaluation  Patient Details  Name: Candice [name] MRN: [id number] Date of Birth: Oct 11, 1975 Referring Provider (PT): Dorna Leitz, MD    Encounter Date: 05/03/2018  PT End of Session - 05/03/18 1551    Visit Number  1    Date for PT Re-Evaluation  07/06/18    Authorization Type  Medicaid     Authorization Time Period  05/03/18 to 07/06/18    PT Start Time  1400    PT Stop Time  1445    PT Time Calculation (min)  45 min    Activity Tolerance  Patient tolerated treatment well;No increased pain    Behavior During Therapy  WFL for tasks assessed/performed       Past Medical History:  Diagnosis Date  . Breast cancer ([place]) 05/2016   right  . Chronic constipation   . Family history of cancer   . Fibroid, uterine   . IBS (irritable bowel syndrome)    IBS - C (constipation)  . Personal history of chemotherapy   . Personal history of radiation therapy     Past Surgical History:  Procedure Laterality Date  . BARTHOLIN CYST MARSUPIALIZATION  09/04/2002  . BARTHOLIN GLAND CYST EXCISION Left 07/13/2005  . BREAST LUMPECTOMY WITH RADIOACTIVE SEED AND SENTINEL LYMPH NODE BIOPSY Right 06/07/2016   Procedure: BREAST LUMPECTOMY WITH RADIOACTIVE SEED AND SENTINEL LYMPH NODE BIOPSY;  Surgeon: Alphonsa Overall, MD;  Location: [place] [facility];  Service: General;  Laterality: Right;  . HYSTEROSCOPY  2016  . MYOMECTOMY  07/20/2004   Laparotomy  . OVARIAN CYST REMOVAL Right 07/20/2004  . PORTACATH PLACEMENT N/A 07/05/2016   REMOVED Fall 2018 ; Procedure: INSERTION PORT-A-CATH WITH [place];  Surgeon: Alphonsa Overall, MD;  Location: [place];  Service: General;  Laterality: N/A;  . ROBOTIC ASSISTED TOTAL HYSTERECTOMY WITH BILATERAL SALPINGO OOPHERECTOMY Bilateral 01/04/2018   Procedure: XI ROBOTIC ASSISTED  TOTAL HYSTERECTOMY WITH BILATERAL SALPINGO OOPHORECTOMY;  Surgeon: Isabel Caprice, MD;  Location: [facility];  Service: Gynecology;  Laterality: Bilateral;    There were no vitals filed for this visit.   Subjective Assessment - 05/03/18 1405    Subjective  Pt reports that she has been having B knee pain for atleast 5 or more years. Pain gradually came on and in the past year it got much worse. She was doing alot of hill runs and interval training which seemed to increase her knee pain. She had some injections in both knees but this is still an issue for her. She does note numbness and tingling in the legs and feet while on the elliptical which resolves once she gets off. Pt later reports that this is more of a "burning" sensation.    Pertinent History  breast cancer; hysterectomy    Currently in Pain?  No/denies    Pain Location  Knee    Pain Orientation  Anterior    Pain Descriptors / Indicators  Aching;Sore    Pain Type  Chronic pain    Pain Onset  More than a month ago    Pain Frequency  Intermittent    Aggravating Factors   walking too much or going up the stairs too much; twisting the knee or stretching out the wrong way     Pain Relieving Factors  avoiding squatting, not working out    Effect of Pain  on Daily Activities  limited ability to workout and maintain health          [facility] PT Assessment - 05/03/18 0001      Assessment   Medical Diagnosis  Rt knee chondromalacia     Referring Provider (PT)  Dorna Leitz, MD     Onset Date/Surgical Date  --   ~5 years ago   Next MD Visit  none for this     Prior Therapy  none for knees       Precautions   Precautions  None      Balance Screen   Has the patient fallen in the past 6 months  No    Has the patient had a decrease in activity level because of a fear of falling?   No    Is the patient reluctant to leave their home because of a fear of falling?   No      Prior Function   Level of Independence  Independent    Leisure  pt  typically completing high level intervals/sprints and total body workouts at the gym; currently only doing elliptical and UE strengthening       Cognition   Overall Cognitive Status  Within Functional Limits for tasks assessed      Observation/Other Assessments   Other Surveys   Other Surveys    Lower Extremity Functional Scale   63/80      Sensation   Light Touch  Appears Intact      Functional Tests   Functional tests  Squat;Single Leg Squat      Squat   Comments  x5 reps BLE without pain; single leg squat x10 reps (+) valgus - no pain       ROM / Strength   AROM / PROM / Strength  Strength;PROM      PROM   Overall PROM Comments  Rt hip ER limited to 30 deg; Lt IR limited to 20 deg       Strength   Strength Assessment Site  Hip;Knee    Right/Left Hip  Right;Left    Right Hip Flexion  5/5    Right Hip Extension  5/5    Right Hip ABduction  4/5    Left Hip Flexion  5/5    Left Hip Extension  5/5    Left Hip ABduction  4/5    Right/Left Knee  Right;Left    Right Knee Flexion  4/5    Right Knee Extension  5/5    Left Knee Flexion  4/5    Left Knee Extension  5/5      Flexibility   Soft Tissue Assessment /Muscle Length  yes    Hamstrings  WNL    Quadriceps  WNL      Ambulation/Gait   Pre-Gait Activities  Pt able to ascend and descend steps with reciprocal pattern, (+) hip adduction and knee valgus noted bilaterally     Gait Comments  WNL and pain free       High Level Balance   High Level Balance Comments  SLS: 10 sec (+) trendelenburg both sides                 Objective measurements completed on examination: See above findings.              PT Education - 05/03/18 1550    Education Details  importance of maintaining strength and flexibility of LE in managing knee pain     Person(s)  Educated  Patient    Methods  Explanation    Comprehension  Verbalized understanding       PT Short Term Goals - 05/03/18 1602      PT SHORT TERM GOAL #1    Title  Pt will demo consistency and independence with her initial HEP to improve exercise understanding and improve LE strength.     Time  4    Period  Weeks    Status  New    Target Date  06/01/18        PT Long Term Goals - 05/03/18 1603      PT LONG TERM GOAL #1   Title  Pt will have increase in BLE strength to 5/5 MMT which will allow for safe return to gym program on her own after d/c.     Time  8    Period  Weeks    Status  New    Target Date  07/06/18      PT LONG TERM GOAL #2   Title  Pt will be able to complete single leg mini squat without UE support and with minimal knee valgus deviation x10 reps which will reflect improvements in LE strength and neuromusular control     Time  8    Period  Weeks    Status  New      PT LONG TERM GOAL #3   Title  Pt will have atleast 9pt improvement on her LEFS to reflect significant decrease in her LE disability and pain with daily activity.     Baseline  63/80    Time  8    Status  New      PT LONG TERM GOAL #4   Title  Pt will report atleast 50% improvement in her pain with daily activity and have good understanding of safe exercise progressions and activity for the gym.     Time  8    Period  Weeks    Status  New             Plan - 05/03/18 1552    Clinical Impression Statement  Pt is a 43 y.o F referred to OPPT with chronic B knee pain which increased in intensity this past year. She was diagnosed with cancer approximately 2 years ago and has been undergoing chemo and other steroid treatments as well as recent weight gain. Pt's prior level of activity included interval/sprinting/total body workouts atleast 6 days a week, but she has recently avoided all LE strengthening and uses the elliptical only. Pt has hip weakness of the abductors and rotators and limited single leg stability noted with Trendelenburg and significant knee valgus deviations during today's evaluation. Pt would benefit from skilled PT to address  limitations in hip strength, flexibility and neuromuscular control as well as educate on proper return to a regular gym routine and decrease pain with daily activity.     History and Personal Factors relevant to plan of care:  breast cancer with chemo; hysterectomy; previously highly active at the gym/running    Clinical Presentation  Evolving    Clinical Presentation due to:  worsening over the past year or so     Clinical Decision Making  Low    Rehab Potential  Good    PT Frequency  2x / week    PT Duration  8 weeks    PT Treatment/Interventions  ADLs/Self Care Home Management;Cryotherapy;Functional mobility training;Therapeutic activities;Therapeutic exercise;Balance training;Patient/family education;Neuromuscular re-education;Manual techniques;Passive range of motion;Taping;Dry needling  PT Next Visit Plan  focus on hip rotation flexibility and strength; hip abductor strength progression; encourage pt to get back into LE strengthening at the gym; neuromuscular control of hip/knee     PT Home Exercise Plan  needs next visit     Consulted and Agree with Plan of Care  Patient       Patient will benefit from skilled therapeutic intervention in order to improve the following deficits and impairments:  Decreased activity tolerance, Decreased strength, Decreased safety awareness, Impaired flexibility, Pain, Improper body mechanics, Decreased range of motion  Visit Diagnosis: Chronic pain of right knee  Chronic pain of left knee  Muscle weakness (generalized)     Problem List Patient Active Problem List   Diagnosis Date Noted  . Fibroid uterus 01/04/2018  . Fibroid, uterine 08/21/2017  . Port catheter in place 09/06/2016  . Genetic testing 05/09/2016  . Malignant neoplasm of upper-inner quadrant of right breast in female, estrogen receptor positive ([place]) 04/27/2016  . Family history of breast cancer   . Family history of ovarian cancer   . Family history of prostate cancer   .  Family history of pancreatic cancer     4:10 PM,05/03/18 Sherol Dade PT, DPT [facility] [address] [place], [place], [address] Phone: [phone]   Fax:  [phone]  Name: Candice [name] MRN: [id number] Date of Birth: 08-04-1975

## 2018-05-09 ENCOUNTER — Ambulatory Visit: Payer: Medicaid Other

## 2018-05-14 ENCOUNTER — Other Ambulatory Visit: Payer: Self-pay | Admitting: Oncology

## 2018-05-14 DIAGNOSIS — Z170 Estrogen receptor positive status [ER+]: Secondary | ICD-10-CM

## 2018-05-14 DIAGNOSIS — C50211 Malignant neoplasm of upper-inner quadrant of right female breast: Secondary | ICD-10-CM

## 2018-05-16 ENCOUNTER — Ambulatory Visit: Payer: Medicaid Other | Admitting: Physical Therapy

## 2018-05-17 ENCOUNTER — Ambulatory Visit: Payer: Medicaid Other | Admitting: Physical Therapy

## 2018-05-17 DIAGNOSIS — M25561 Pain in right knee: Secondary | ICD-10-CM | POA: Diagnosis not present

## 2018-05-17 DIAGNOSIS — M6281 Muscle weakness (generalized): Secondary | ICD-10-CM

## 2018-05-17 DIAGNOSIS — M25562 Pain in left knee: Secondary | ICD-10-CM

## 2018-05-17 DIAGNOSIS — M62838 Other muscle spasm: Secondary | ICD-10-CM

## 2018-05-17 DIAGNOSIS — G8929 Other chronic pain: Secondary | ICD-10-CM

## 2018-05-17 NOTE — Therapy (Signed)
[facility] [address] [place], [place], [address] Phone: [phone]   Fax:  [phone]  Physical Therapy Treatment  Patient Details  Name: Candice [name] MRN: [id number] Date of Birth: Jun 04, 1975 Referring Provider (PT): Dorna Leitz, MD    Encounter Date: 05/17/2018  PT End of Session - 05/17/18 1635    Visit Number  2    Date for PT Re-Evaluation  07/06/18    Authorization Type  Medicaid     Authorization Time Period  05/03/18 to 07/06/18    PT Start Time  1402    PT Stop Time  1447    PT Time Calculation (min)  45 min    Activity Tolerance  Patient tolerated treatment well;No increased pain    Behavior During Therapy  WFL for tasks assessed/performed       Past Medical History:  Diagnosis Date  . Breast cancer ([place]) 05/2016   right  . Chronic constipation   . Family history of cancer   . Fibroid, uterine   . IBS (irritable bowel syndrome)    IBS - C (constipation)  . Personal history of chemotherapy   . Personal history of radiation therapy     Past Surgical History:  Procedure Laterality Date  . BARTHOLIN CYST MARSUPIALIZATION  09/04/2002  . BARTHOLIN GLAND CYST EXCISION Left 07/13/2005  . BREAST LUMPECTOMY WITH RADIOACTIVE SEED AND SENTINEL LYMPH NODE BIOPSY Right 06/07/2016   Procedure: BREAST LUMPECTOMY WITH RADIOACTIVE SEED AND SENTINEL LYMPH NODE BIOPSY;  Surgeon: Alphonsa Overall, MD;  Location: [facility];  Service: General;  Laterality: Right;  . HYSTEROSCOPY  2016  . MYOMECTOMY  07/20/2004   Laparotomy  . OVARIAN CYST REMOVAL Right 07/20/2004  . PORTACATH PLACEMENT N/A 07/05/2016   REMOVED Fall 2018 ; Procedure: INSERTION PORT-A-CATH WITH [place];  Surgeon: Alphonsa Overall, MD;  Location: [facility];  Service: General;  Laterality: N/A;  . ROBOTIC ASSISTED TOTAL HYSTERECTOMY WITH BILATERAL SALPINGO OOPHERECTOMY Bilateral 01/04/2018   Procedure: XI ROBOTIC ASSISTED  TOTAL HYSTERECTOMY WITH BILATERAL SALPINGO OOPHORECTOMY;  Surgeon: Isabel Caprice, MD;  Location[facility];  Service: Gynecology;  Laterality: Bilateral;    There were no vitals filed for this visit.  Subjective Assessment - 05/17/18 1406    Subjective  Pt reports that things are going well. No pain currently.     Pertinent History  breast cancer; hysterectomy    Currently in Pain?  No/denies    Pain Onset  More than a month ago                       [facility] Adult PT Treatment/Exercise - 05/17/18 0001      Exercises   Exercises  Knee/Hip      Knee/Hip Exercises: Stretches   Hip Flexor Stretch  Both;2 reps;20 seconds    Hip Flexor Stretch Limitations  half kneel     Other Knee/Hip Stretches  piriformis stretch supine 2 ways, quadruped x30 sec each      Knee/Hip Exercises: Standing   Other Standing Knee Exercises  Hip ER through full range of motion and LE on mat table (green band pulled medially) x10 reps each      Knee/Hip Exercises: Supine   Other Supine Knee/Hip Exercises  BLE bridge with trunk on mat table/LE on floor x10 reps, single leg x7 reps each    Other Supine Knee/Hip Exercises  deep abdominal activation, tactile cues required for proper breathing  and avoiding valsalva maneuver x15 reps      Knee/Hip Exercises: Sidelying   Hip ABduction  Right;Left;Strengthening;1 set;10 reps    Hip ABduction Limitations  tactile cues to decrease hip flexion             PT Education - 05/17/18 1630    Education Details  proper breathing technique; answered pt questions regarding knee pathology and benefits of exercise, etc; implemented and reviewed HEP    Person(s) Educated  Patient    Methods  Explanation;Verbal cues;Handout    Comprehension  Verbalized understanding;Returned demonstration       PT Short Term Goals - 05/03/18 1602      PT SHORT TERM GOAL #1   Title  Pt will demo consistency and independence with her initial HEP to improve exercise  understanding and improve LE strength.     Time  4    Period  Weeks    Status  New    Target Date  06/01/18        PT Long Term Goals - 05/03/18 1603      PT LONG TERM GOAL #1   Title  Pt will have increase in BLE strength to 5/5 MMT which will allow for safe return to gym program on her own after d/c.     Time  8    Period  Weeks    Status  New    Target Date  07/06/18      PT LONG TERM GOAL #2   Title  Pt will be able to complete single leg mini squat without UE support and with minimal knee valgus deviation x10 reps which will reflect improvements in LE strength and neuromusular control     Time  8    Period  Weeks    Status  New      PT LONG TERM GOAL #3   Title  Pt will have atleast 9pt improvement on her LEFS to reflect significant decrease in her LE disability and pain with daily activity.     Baseline  63/80    Time  8    Status  New      PT LONG TERM GOAL #4   Title  Pt will report atleast 50% improvement in her pain with daily activity and have good understanding of safe exercise progressions and activity for the gym.     Time  8    Period  Weeks    Status  New            Plan - 05/17/18 1636    Clinical Impression Statement  Pt arrived for her first treatment session since her evaluation. She reported no pain upon arrival and a majority of today's session focused on implementing her HEP for improved hip strength and mobility. Pt does require heavy cues for technique correction and often tries to speed through her exercise. Pt was able to complete all exercises added to her HEP without any reported increase in knee pain. Will conitnue to provide education and progress exercise for future return to a gym program.     Rehab Potential  Good    PT Frequency  2x / week    PT Duration  8 weeks    PT Treatment/Interventions  ADLs/Self Care Home Management;Cryotherapy;Functional mobility training;Therapeutic activities;Therapeutic exercise;Balance  training;Patient/family education;Neuromuscular re-education;Manual techniques;Passive range of motion;Taping;Dry needling    PT Next Visit Plan  focus on hip rotation flexibility and strength; hip abductor strength progression; encourage pt to get  back into LE strengthening at the gym; neuromuscular control of hip/knee     PT Home Exercise Plan  [phone]     Consulted and Agree with Plan of Care  Patient       Patient will benefit from skilled therapeutic intervention in order to improve the following deficits and impairments:  Decreased activity tolerance, Decreased strength, Decreased safety awareness, Impaired flexibility, Pain, Improper body mechanics, Decreased range of motion  Visit Diagnosis: Chronic pain of right knee  Chronic pain of left knee  Muscle weakness (generalized)  Other muscle spasm     Problem List Patient Active Problem List   Diagnosis Date Noted  . Fibroid uterus 01/04/2018  . Fibroid, uterine 08/21/2017  . Port catheter in place 09/06/2016  . Genetic testing 05/09/2016  . Malignant neoplasm of upper-inner quadrant of right breast in female, estrogen receptor positive ([place]) 04/27/2016  . Family history of breast cancer   . Family history of ovarian cancer   . Family history of prostate cancer   . Family history of pancreatic cancer     4:41 PM,05/17/18 Sherol Dade PT, DPT [facility] [address] [place], [place], [address] Phone: [phone]   Fax:  [phone]  Name: Candice [name] MRN: [id number] Date of Birth: July 20, 1975

## 2018-05-17 NOTE — Patient Instructions (Signed)
Access Code: [email or web address]  URL: [email or web address]  Date: 05/17/2018  Prepared by: Sherol Dade   Exercises  Quadruped Piriformis Stretch - 2 reps - 30 hold - 1x daily - 7x weekly  Sidelying Hip Abduction on Wall - 10 reps - 2 sets - 1x daily - 7x weekly  Bridge with Upper Back on Swiss Ball - 10 reps - 1x daily - 7x weekly  Supine Transversus Abdominis Bracing - Hands on Stomach - 10 reps - 3 sets - 1x daily - 7x weekly    [facility] [address] [place],  [address] Phone # [phone] Fax [phone]

## 2018-05-22 ENCOUNTER — Ambulatory Visit
Admission: RE | Admit: 2018-05-22 | Discharge: 2018-05-22 | Disposition: A | Discharge status: Home / Self Care | Payer: Medicaid Other | Source: Ambulatory Visit | Attending: Oncology | Admitting: Oncology

## 2018-05-22 DIAGNOSIS — C50211 Malignant neoplasm of upper-inner quadrant of right female breast: Secondary | ICD-10-CM

## 2018-05-22 DIAGNOSIS — Z170 Estrogen receptor positive status [ER+]: Secondary | ICD-10-CM

## 2018-05-22 DIAGNOSIS — D250 Submucous leiomyoma of uterus: Secondary | ICD-10-CM

## 2018-05-24 ENCOUNTER — Ambulatory Visit: Payer: Medicaid Other | Admitting: Physical Therapy

## 2018-05-24 ENCOUNTER — Encounter: Payer: Self-pay | Admitting: Physical Therapy

## 2018-05-24 DIAGNOSIS — M6281 Muscle weakness (generalized): Secondary | ICD-10-CM

## 2018-05-24 DIAGNOSIS — G8929 Other chronic pain: Secondary | ICD-10-CM

## 2018-05-24 DIAGNOSIS — M25561 Pain in right knee: Principal | ICD-10-CM

## 2018-05-24 DIAGNOSIS — M25562 Pain in left knee: Secondary | ICD-10-CM

## 2018-05-24 DIAGNOSIS — M62838 Other muscle spasm: Secondary | ICD-10-CM

## 2018-05-24 NOTE — Therapy (Signed)
[facility] [address] [place], [place], [address] Phone: [phone]   Fax:  [phone]  Physical Therapy Treatment  Patient Details  Name: Candice [name] MRN: [id number] Date of Birth: September 12, 1975 Referring Provider (PT): Dorna Leitz, MD    Encounter Date: 05/24/2018  PT End of Session - 05/24/18 1659    Visit Number  3    Date for PT Re-Evaluation  07/06/18    Authorization Type  Medicaid     Authorization Time Period  auth 2/10-3/1 for 3 visits    Authorization - Visit Number  3    Authorization - Number of Visits  4    PT Start Time  3790    PT Stop Time  1700    PT Time Calculation (min)  45 min    Activity Tolerance  Patient tolerated treatment well;No increased pain    Behavior During Therapy  WFL for tasks assessed/performed       Past Medical History:  Diagnosis Date  . Breast cancer ([place]) 05/2016   right  . Chronic constipation   . Family history of cancer   . Fibroid, uterine   . IBS (irritable bowel syndrome)    IBS - C (constipation)  . Personal history of chemotherapy   . Personal history of radiation therapy     Past Surgical History:  Procedure Laterality Date  . BARTHOLIN CYST MARSUPIALIZATION  09/04/2002  . BARTHOLIN GLAND CYST EXCISION Left 07/13/2005  . BREAST LUMPECTOMY WITH RADIOACTIVE SEED AND SENTINEL LYMPH NODE BIOPSY Right 06/07/2016   Procedure: BREAST LUMPECTOMY WITH RADIOACTIVE SEED AND SENTINEL LYMPH NODE BIOPSY;  Surgeon: Alphonsa Overall, MD;  Location: [place];  Service: General;  Laterality: Right;  . HYSTEROSCOPY  2016  . MYOMECTOMY  07/20/2004   Laparotomy  . OVARIAN CYST REMOVAL Right 07/20/2004  . PORTACATH PLACEMENT N/A 07/05/2016   REMOVED Fall 2018 ; Procedure: INSERTION PORT-A-CATH WITH [place];  Surgeon: Alphonsa Overall, MD;  Location: [place];  Service: General;  Laterality: N/A;  . ROBOTIC ASSISTED TOTAL HYSTERECTOMY WITH  BILATERAL SALPINGO OOPHERECTOMY Bilateral 01/04/2018   Procedure: XI ROBOTIC ASSISTED TOTAL HYSTERECTOMY WITH BILATERAL SALPINGO OOPHORECTOMY;  Surgeon: Isabel Caprice, MD;  Location[facility];  Service: Gynecology;  Laterality: Bilateral;    There were no vitals filed for this visit.  Subjective Assessment - 05/24/18 1619    Subjective  I have not been doing my exercises daily. NO change in pain.     Pertinent History  breast cancer; hysterectomy    Patient Stated Goals  walk without pain    Currently in Pain?  Yes    Pain Score  5     Pain Location  Knee    Pain Orientation  Right;Left;Anterior    Pain Descriptors / Indicators  Aching;Sore    Pain Type  Chronic pain    Pain Onset  More than a month ago    Pain Frequency  Intermittent    Aggravating Factors   walking up stairs, squatting, kneeling    Pain Relieving Factors  anti inflammatory medication    Multiple Pain Sites  No         [facility] PT Assessment - 05/24/18 0001      Assessment   Medical Diagnosis  Rt knee chondromalacia     Referring Provider (PT)  Dorna Leitz, MD     Onset Date/Surgical Date  --   ~5 years ago   Next MD  Visit  none for this     Prior Therapy  none for knees       Precautions   Precautions  None      Restrictions   Weight Bearing Restrictions  No      Prior Function   Level of Independence  Independent    Leisure  pt typically completing high level intervals/sprints and total body workouts at the gym; currently only doing elliptical and UE strengthening       Cognition   Overall Cognitive Status  Within Functional Limits for tasks assessed      Observation/Other Assessments   Other Surveys   Other Surveys    Lower Extremity Functional Scale   70/80      Sensation   Light Touch  Appears Intact      Functional Tests   Functional tests  Squat;Single Leg Squat      Squat   Comments  squat with knees over toes causes 3/10 pain, if knees do not go past toes no pain      Single Leg  Squat   Comments  increased valgus of both knees, pain level 3/10      Posture/Postural Control   Posture/Postural Control  Postural limitations    Posture Comments  increased valgus of knees      PROM   Overall PROM Comments  left ER 40 degrees; right ER 35 degrees      Strength   Right Hip External Rotation   4/5    Right Hip Internal Rotation  4/5    Right Hip ABduction  4+/5    Left Hip External Rotation  4/5    Left Hip Internal Rotation  4/5    Left Hip ABduction  4/5    Right Knee Flexion  4+/5    Right Knee Extension  5/5    Left Knee Flexion  4+/5    Left Knee Extension  5/5      Ambulation/Gait   Pre-Gait Activities  positive for knee valgus going up and down stairs                   [facility] Adult PT Treatment/Exercise - 05/24/18 0001      Self-Care   Self-Care  Other Self-Care Comments    Other Self-Care Comments   educated patient on not doing arm bike or machines that have repetive motion of the arms to prevent Lymph[place]a due to breast cancer      Knee/Hip Exercises: Stretches   Piriformis Stretch  Right;Left;1 rep;30 seconds   pigeon pose     Knee/Hip Exercises: Aerobic   Elliptical  level 2 on elliptical     Nustep  seat #8, level 3; 7 min      Knee/Hip Exercises: Supine   Other Supine Knee/Hip Exercises  BLE bridge with trunk on mat table/LE on floor x15 reps, single leg x15 reps each             PT Education - 05/24/18 1658    Education Details  Access Code: [email or web address]     Person(s) Educated  Patient    Methods  Explanation;Demonstration;Verbal cues;Handout    Comprehension  Verbal cues required;Returned demonstration;Verbalized understanding       PT Short Term Goals - 05/24/18 1623      PT SHORT TERM GOAL #1   Title  Pt will demo consistency and independence with her initial HEP to improve exercise understanding and improve LE strength.  Time  4    Period  Weeks    Status  Achieved        PT Long Term Goals - 05/24/18  1624      PT LONG TERM GOAL #1   Title  Pt will have increase in BLE strength to 5/5 MMT which will allow for safe return to gym program on her own after d/c.     Baseline  Patient is currently not doing any ex and lacks knowledge of appropriate HEP    Time  8    Period  Weeks    Status  On-going      PT LONG TERM GOAL #2   Title  Pt will be able to complete single leg mini squat without UE support and with minimal knee valgus deviation x10 reps which will reflect improvements in LE strength and neuromusular control     Baseline  increased valgus and pain level is 3/10    Time  8    Period  Weeks    Status  On-going      PT LONG TERM GOAL #3   Title  Pt will have atleast 9pt improvement on her LEFS to reflect significant decrease in her LE disability and pain with daily activity.     Baseline   initial eval was 63/80 and update is 70/80    Time  8    Period  Weeks    Status  On-going      PT LONG TERM GOAL #4   Title  Pt will report atleast 50% improvement in her pain with daily activity and have good understanding of safe exercise progressions and activity for the gym.     Baseline  no change in pain due to improper technique    Time  8    Period  Weeks    Status  On-going            Plan - 05/24/18 1654    Clinical Impression Statement  Patient pain level is 5/10 in bilateral knees intermittently with squatting, stairs, walking, and kneeling. Patient Lower Extremity Functional Index is 70/80 compared to initial eval 63/80. Patient has 3/10 pain with squats with knees infront of feet but if behind the toes no pain. Patient is only able to squat to 45 degrees due to weakness in hips and knees. Single leg squat with increased valgus with pain level 3/10. Bilateral knee flexion 4+/5. Hip external rotation P/ROM right is 35 degrees and left is 40 degrees. Bilateral hip abduction, external rotation and internal rotation is 4/5. Patient does require heavy verbal cues for technique  correction and often tries to speed through her exericse. Patient will benefit from skilled therapy to improve strength of lower extremities and education on correct technique of her exercise program.     Rehab Potential  Good    PT Frequency  2x / week    PT Duration  8 weeks    PT Treatment/Interventions  ADLs/Self Care Home Management;Cryotherapy;Functional mobility training;Therapeutic activities;Therapeutic exercise;Balance training;Patient/family education;Neuromuscular re-education;Manual techniques;Passive range of motion;Taping;Dry needling    PT Next Visit Plan  focus on hip rotation flexibility and strength; hip abductor strength progression; encourage pt to get back into LE strengthening at the gym; neuromuscular control of hip/knee     PT Home Exercise Plan  Access Code: [email or web address]     Recommended Other Services  MD signed initial note    Consulted and Agree with Plan of Care  Patient  Patient will benefit from skilled therapeutic intervention in order to improve the following deficits and impairments:  Decreased activity tolerance, Decreased strength, Decreased safety awareness, Impaired flexibility, Pain, Improper body mechanics, Decreased range of motion  Visit Diagnosis: Chronic pain of right knee  Chronic pain of left knee  Muscle weakness (generalized)  Other muscle spasm     Problem List Patient Active Problem List   Diagnosis Date Noted  . Fibroid uterus 01/04/2018  . Fibroid, uterine 08/21/2017  . Port catheter in place 09/06/2016  . Genetic testing 05/09/2016  . Malignant neoplasm of upper-inner quadrant of right breast in female, estrogen receptor positive ([place]) 04/27/2016  . Family history of breast cancer   . Family history of ovarian cancer   . Family history of prostate cancer   . Family history of pancreatic cancer     Earlie Counts, PT 05/24/18 5:09 PM   [facility] [address], [place], [place], [address] Phone: [phone]   Fax:  [phone]  Name: Ylonda Storr MRN: [id number] Date of Birth: 09-25-1975

## 2018-05-24 NOTE — Patient Instructions (Signed)
Access Code: [email or web address]  URL: [email or web address]  Date: 05/17/2018  Prepared by: Earlie Counts   Exercises  Quadruped Piriformis Stretch - 2 reps - 30 hold - 1x daily - 7x weekly  Sidelying Hip Abduction on Wall - 10 reps - 2 sets - 1x daily - 7x weekly  Bridge with Upper Back on Swiss Ball - 10 reps - 1x daily - 7x weekly  Supine Transversus Abdominis Bracing - Hands on Stomach - 10 reps - 3 sets - 1x daily - 7x weekly  [facility] [address], [place], [place] [address] Phone # [phone] Fax [phone]

## 2018-05-30 ENCOUNTER — Telehealth: Payer: Self-pay

## 2018-05-30 NOTE — Telephone Encounter (Signed)
Nurse returned call per request.  LVM to have pt return call.

## 2018-05-31 ENCOUNTER — Other Ambulatory Visit: Payer: Medicaid Other

## 2018-05-31 ENCOUNTER — Inpatient Hospital Stay: Payer: Medicaid Other

## 2018-05-31 ENCOUNTER — Inpatient Hospital Stay: Payer: Medicaid Other | Admitting: Oncology

## 2018-05-31 ENCOUNTER — Ambulatory Visit: Payer: Medicaid Other | Admitting: Oncology

## 2018-06-01 ENCOUNTER — Encounter

## 2018-06-12 ENCOUNTER — Encounter: Payer: Medicaid Other | Admitting: Physical Therapy

## 2018-06-14 ENCOUNTER — Ambulatory Visit: Payer: Medicaid Other | Admitting: Physical Therapy

## 2018-06-14 ENCOUNTER — Encounter

## 2018-06-15 ENCOUNTER — Encounter

## 2018-06-19 ENCOUNTER — Encounter: Payer: Medicaid Other | Admitting: Physical Therapy

## 2018-06-21 ENCOUNTER — Encounter: Payer: Medicaid Other | Admitting: Physical Therapy

## 2018-06-26 ENCOUNTER — Encounter: Payer: Medicaid Other | Admitting: Physical Therapy

## 2018-06-28 ENCOUNTER — Ambulatory Visit: Payer: Medicaid Other | Admitting: Physical Therapy

## 2018-06-28 ENCOUNTER — Encounter: Payer: Medicaid Other | Admitting: Physical Therapy

## 2018-07-03 ENCOUNTER — Encounter: Payer: Medicaid Other | Admitting: Physical Therapy

## 2018-07-04 ENCOUNTER — Encounter: Payer: Medicaid Other | Admitting: Physical Therapy

## 2018-07-06 ENCOUNTER — Telehealth: Payer: Self-pay | Admitting: Oncology

## 2018-07-06 NOTE — Telephone Encounter (Signed)
Returned call re rescheduling 5/20 lab/fu. Left message re new lab/fu 6/3. Schedule mailed.

## 2018-07-21 ENCOUNTER — Other Ambulatory Visit: Payer: Self-pay | Admitting: Nurse Practitioner

## 2018-07-23 ENCOUNTER — Other Ambulatory Visit: Payer: Medicaid Other

## 2018-08-09 ENCOUNTER — Ambulatory Visit: Payer: Medicaid Other | Attending: Orthopedic Surgery | Admitting: Physical Therapy

## 2018-08-09 ENCOUNTER — Other Ambulatory Visit: Payer: Self-pay

## 2018-08-09 DIAGNOSIS — M62838 Other muscle spasm: Secondary | ICD-10-CM | POA: Diagnosis present

## 2018-08-09 DIAGNOSIS — M6281 Muscle weakness (generalized): Secondary | ICD-10-CM | POA: Insufficient documentation

## 2018-08-09 DIAGNOSIS — G8929 Other chronic pain: Secondary | ICD-10-CM | POA: Insufficient documentation

## 2018-08-09 DIAGNOSIS — M25562 Pain in left knee: Secondary | ICD-10-CM | POA: Insufficient documentation

## 2018-08-09 DIAGNOSIS — M25561 Pain in right knee: Secondary | ICD-10-CM | POA: Insufficient documentation

## 2018-08-09 NOTE — Therapy (Signed)
[facility] [address], [place], [place], [address] Phone: [phone]   Fax:  [phone]  Physical Therapy Treatment/re-evaluation  Patient Details  Name: Candice [name] MRN: [id number] Date of Birth: [birth year] Referring Provider (PT): Dorna Leitz, MD    Encounter Date: 08/09/2018  PT End of Session - 08/09/18 1301    Visit Number  4    Date for PT Re-Evaluation  10/05/18    Authorization Type  Medicaid     Authorization Time Period  Medicaid: 10 visits from 07/30/18 to 10/07/18    Authorization - Visit Number  1    Authorization - Number of Visits  10    PT Start Time  1300    PT Stop Time  1340    PT Time Calculation (min)  40 min    Activity Tolerance  Patient tolerated treatment well;No increased pain    Behavior During Therapy  WFL for tasks assessed/performed       Past Medical History:  Diagnosis Date  . Breast cancer ([place]) 05/2016   right  . Chronic constipation   . Family history of cancer   . Fibroid, uterine   . IBS (irritable bowel syndrome)    IBS - C (constipation)  . Personal history of chemotherapy   . Personal history of radiation therapy     Past Surgical History:  Procedure Laterality Date  . BARTHOLIN CYST MARSUPIALIZATION  09/04/2002  . BARTHOLIN GLAND CYST EXCISION Left 07/13/2005  . BREAST LUMPECTOMY WITH RADIOACTIVE SEED AND SENTINEL LYMPH NODE BIOPSY Right 06/07/2016   Procedure: BREAST LUMPECTOMY WITH RADIOACTIVE SEED AND SENTINEL LYMPH NODE BIOPSY;  Surgeon: Alphonsa Overall, MD;  Location: [place];  Service: General;  Laterality: Right;  . HYSTEROSCOPY  2016  . MYOMECTOMY  07/20/2004   Laparotomy  . OVARIAN CYST REMOVAL Right 07/20/2004  . PORTACATH PLACEMENT N/A 07/05/2016   REMOVED Fall 2018 ; Procedure: INSERTION PORT-A-CATH WITH [place];  Surgeon: Alphonsa Overall, MD;  Location: [place];  Service: General;  Laterality: N/A;  . ROBOTIC  ASSISTED TOTAL HYSTERECTOMY WITH BILATERAL SALPINGO OOPHERECTOMY Bilateral 01/04/2018   Procedure: XI ROBOTIC ASSISTED TOTAL HYSTERECTOMY WITH BILATERAL SALPINGO OOPHORECTOMY;  Surgeon: Isabel Caprice, MD;  Location: [facility];  Service: Gynecology;  Laterality: Bilateral;    There were no vitals filed for this visit.  Subjective Assessment - 08/09/18 1300    Subjective  Pt states that she has been working from home.     Pertinent History  breast cancer; hysterectomy    Patient Stated Goals  walk without pain    Pain Onset  More than a month ago         [facility] PT Assessment - 08/09/18 0001      Assessment   Medical Diagnosis  Rt knee chondromalacia     Referring Provider (PT)  Dorna Leitz, MD     Onset Date/Surgical Date  --   ~5 years ago   Next MD Visit  none for this     Prior Therapy  none for knees       Precautions   Precautions  None      Restrictions   Weight Bearing Restrictions  No      Prior Function   Level of Independence  Independent    Leisure  pt typically completing high level intervals/sprints and total body workouts at the gym; currently only doing elliptical and UE strengthening  Cognition   Overall Cognitive Status  Within Functional Limits for tasks assessed      Observation/Other Assessments   Lower Extremity Functional Scale   70/80      Sensation   Light Touch  Appears Intact      Functional Tests   Functional tests  Squat;Single Leg Squat      Squat   Comments  squat with knees over toes causes 3/10 pain, if knees do not go past toes no pain      Single Leg Squat   Comments  increased valgus of both knees, pain level 3/10      Posture/Postural Control   Posture/Postural Control  Postural limitations    Posture Comments  increased valgus of knees      PROM   Overall PROM Comments  left ER 40 degrees; right ER 35 degrees      Strength   Right Hip External Rotation   4/5    Right Hip Internal Rotation  4/5    Right Hip ABduction   4+/5    Left Hip External Rotation  4/5    Left Hip Internal Rotation  4/5    Left Hip ABduction  4/5    Right Knee Flexion  4+/5    Right Knee Extension  5/5    Left Knee Flexion  4+/5    Left Knee Extension  5/5      Ambulation/Gait   Pre-Gait Activities  (+) knee valgus when going up/down stairs                    [facility] Adult PT Treatment/Exercise - 08/09/18 0001      Knee/Hip Exercises: Stretches   Piriformis Stretch  Both;1 rep;30 seconds      Knee/Hip Exercises: Machines for Strengthening   Total Gym Leg Press  seat 6 BLE# 60 x15 reps; single leg #60 x10 reps each       Knee/Hip Exercises: Standing   Other Standing Knee Exercises  barbell deadlift with #30 x10 reps     Other Standing Knee Exercises  unweighted deadlifts x10 reps therapist providing verbal cues/demonstration      Knee/Hip Exercises: Seated   Other Seated Knee/Hip Exercises  hip openers x10 reps each       Knee/Hip Exercises: Supine   Bridges with Clamshell  Both;10 reps   green TB   Other Supine Knee/Hip Exercises  BLE bridge with trunk on mat table x5 reps technique feedback             PT Education - 08/09/18 1347    Education Details  updated and reviewed HEP; importance of easing back into activity 30 min walks instead of 1-2 hours     Person(s) Educated  Patient    Methods  Explanation;Handout;Verbal cues    Comprehension  Verbalized understanding;Returned demonstration       PT Short Term Goals - 08/09/18 1352      PT SHORT TERM GOAL #1   Title  Pt will demo consistency and independence with her initial HEP to improve exercise understanding and improve LE strength.     Time  4    Period  Weeks    Status  Achieved    Target Date  09/06/18        PT Long Term Goals - 08/09/18 1352      PT LONG TERM GOAL #1   Title  Pt will have increase in BLE strength to 5/5 MMT which will  allow for safe return to gym program on her own after d/c.     Baseline  Patient is  currently not doing any ex and lacks knowledge of appropriate HEP    Time  10    Period  Weeks    Status  On-going    Target Date  10/05/18      PT LONG TERM GOAL #2   Title  Pt will be able to complete single leg mini squat without UE support and with minimal knee valgus deviation x10 reps which will reflect improvements in LE strength and neuromusular control     Baseline  increased valgus and pain level is 3/10    Time  10    Period  Weeks    Status  On-going      PT LONG TERM GOAL #3   Title  Pt will have atleast 9pt improvement on her LEFS to reflect significant decrease in her LE disability and pain with daily activity.     Baseline   initial eval was 63/80 and update is 70/80    Time  10    Period  Weeks    Status  On-going      PT LONG TERM GOAL #4   Title  Pt will report atleast 50% improvement in her pain with daily activity and have good understanding of safe exercise progressions and activity for the gym.     Baseline  no change in pain due to improper technique    Time  10    Period  Weeks    Status  On-going            Plan - 08/09/18 1304    Clinical Impression Statement  Pt was unable to attend PT for the past 2 months secondary to COVID-19 restrictions. She has attempted to increase HEP adherence and has been trying to go on daily walks. Pt reports increased knee pain with her 1-2 hour long walks. Therapist educated pt on the importance of gradually increasing her exercise intensity/time to allow for her body to adjust. Pt was able to complete leg press and deadlift activity without reported increase in knee pain and required initial cuing for proper mechanics. Pt continues to have limitations in LE strength, flexibility and activity participation due to her bilateral knee pain and would continue to benefit from skilled PT to provide necessary education and interventions for improved fitness/well-being.    Rehab Potential  Good    PT Frequency  1x / week    PT  Duration  Other (comment)   10 weeks   PT Treatment/Interventions  ADLs/Self Care Home Management;Cryotherapy;Functional mobility training;Therapeutic activities;Therapeutic exercise;Balance training;Patient/family education;Neuromuscular re-education;Manual techniques;Passive range of motion;Taping;Dry needling    PT Next Visit Plan  f/u on HEP adherence and knee pain following session; focus on hip rotation flexibility and strength; hip abductor strength progression; encourage pt to get back into LE strengthening at the gym; neuromuscular control of hip/knee     PT Home Exercise Plan  Access Code: [email or web address]     Consulted and Agree with Plan of Care  Patient       Patient will benefit from skilled therapeutic intervention in order to improve the following deficits and impairments:  Decreased activity tolerance, Decreased strength, Decreased safety awareness, Impaired flexibility, Pain, Improper body mechanics, Decreased range of motion  Visit Diagnosis: Chronic pain of right knee  Chronic pain of left knee  Muscle weakness (generalized)  Other muscle spasm     Problem  List Patient Active Problem List   Diagnosis Date Noted  . Fibroid uterus 01/04/2018  . Fibroid, uterine 08/21/2017  . Port catheter in place 09/06/2016  . Genetic testing 05/09/2016  . Malignant neoplasm of upper-inner quadrant of right breast in female, estrogen receptor positive ([place]) 04/27/2016  . Family history of breast cancer   . Family history of ovarian cancer   . Family history of prostate cancer   . Family history of pancreatic cancer     1:55 PM,08/09/18 Sherol Dade PT, DPT [facility] [address], [place], [place], [address] Phone: [phone]   Fax:  [phone]  Name: Candice [name] MRN: [id number] Date of Birth: [birth year]

## 2018-08-09 NOTE — Patient Instructions (Signed)
Access Code: [email or web address]  URL: [email or web address]  Date: 08/09/2018  Prepared by: Sherol Dade   Exercises  Quadruped Piriformis Stretch - 2 reps - 30 hold - 1x daily - 7x weekly  Sidelying Hip Abduction on Wall - 10 reps - 2 sets - 1x daily - 7x weekly  Bridge with Upper Back on Swiss Ball - 10 reps - 1x daily - 7x weekly  Bridge with Hip Abduction and Resistance - Ground Touches - 10 reps - 2 sets - 1x daily - 7x weekly    [facility] [address] [place], [place] [address] Phone # [phone] Fax [phone]

## 2018-08-10 ENCOUNTER — Telehealth: Payer: Self-pay | Admitting: Oncology

## 2018-08-10 NOTE — Telephone Encounter (Signed)
Per 5/13 schedule message moved 6/3 lab/fu to 7/21. Left message for patient. Schedule mailed.

## 2018-08-15 ENCOUNTER — Other Ambulatory Visit: Payer: Medicaid Other

## 2018-08-15 ENCOUNTER — Ambulatory Visit: Payer: Medicaid Other | Admitting: Oncology

## 2018-08-21 ENCOUNTER — Telehealth: Payer: Self-pay | Admitting: Physical Therapy

## 2018-08-22 ENCOUNTER — Other Ambulatory Visit: Payer: Medicaid Other

## 2018-08-23 ENCOUNTER — Ambulatory Visit: Payer: Medicaid Other | Admitting: Physical Therapy

## 2018-08-23 ENCOUNTER — Other Ambulatory Visit: Payer: Self-pay

## 2018-08-23 ENCOUNTER — Encounter: Payer: Self-pay | Admitting: Physical Therapy

## 2018-08-23 DIAGNOSIS — M62838 Other muscle spasm: Secondary | ICD-10-CM

## 2018-08-23 DIAGNOSIS — M6281 Muscle weakness (generalized): Secondary | ICD-10-CM

## 2018-08-23 DIAGNOSIS — G8929 Other chronic pain: Secondary | ICD-10-CM

## 2018-08-23 DIAGNOSIS — M25561 Pain in right knee: Secondary | ICD-10-CM | POA: Diagnosis not present

## 2018-08-23 NOTE — Therapy (Signed)
[facility] [address], [place], [place], [address] Phone: [phone]   Fax:  [phone]  Physical Therapy Treatment  Patient Details  Name: Candice [name] MRN: [id number] Date of Birth: 11-24-1975 Referring Provider (PT): Dorna Leitz, MD    Encounter Date: 08/23/2018  PT End of Session - 08/23/18 1420    Visit Number  5    Date for PT Re-Evaluation  10/05/18    Authorization Type  Medicaid     Authorization Time Period  Medicaid: 10 visits from 07/30/18 to 10/07/18    Authorization - Visit Number  2    Authorization - Number of Visits  10    PT Start Time  6606    PT Stop Time  3016    PT Time Calculation (min)  41 min    Activity Tolerance  Patient tolerated treatment well;No increased pain    Behavior During Therapy  WFL for tasks assessed/performed       Past Medical History:  Diagnosis Date  . Breast cancer ([place]) 05/2016   right  . Chronic constipation   . Family history of cancer   . Fibroid, uterine   . IBS (irritable bowel syndrome)    IBS - C (constipation)  . Personal history of chemotherapy   . Personal history of radiation therapy     Past Surgical History:  Procedure Laterality Date  . BARTHOLIN CYST MARSUPIALIZATION  09/04/2002  . BARTHOLIN GLAND CYST EXCISION Left 07/13/2005  . BREAST LUMPECTOMY WITH RADIOACTIVE SEED AND SENTINEL LYMPH NODE BIOPSY Right 06/07/2016   Procedure: BREAST LUMPECTOMY WITH RADIOACTIVE SEED AND SENTINEL LYMPH NODE BIOPSY;  Surgeon: Alphonsa Overall, MD;  Location: [place];  Service: General;  Laterality: Right;  . HYSTEROSCOPY  2016  . MYOMECTOMY  07/20/2004   Laparotomy  . OVARIAN CYST REMOVAL Right 07/20/2004  . PORTACATH PLACEMENT N/A 07/05/2016   REMOVED Fall 2018 ; Procedure: INSERTION PORT-A-CATH WITH [place];  Surgeon: Alphonsa Overall, MD;  Location: [place];  Service: General;  Laterality: N/A;  . ROBOTIC ASSISTED TOTAL  HYSTERECTOMY WITH BILATERAL SALPINGO OOPHERECTOMY Bilateral 01/04/2018   Procedure: XI ROBOTIC ASSISTED TOTAL HYSTERECTOMY WITH BILATERAL SALPINGO OOPHORECTOMY;  Surgeon: Isabel Caprice, MD;  Location[facility];  Service: Gynecology;  Laterality: Bilateral;    There were no vitals filed for this visit.  Subjective Assessment - 08/23/18 1405    Subjective  Pt states that things are going well. She is trying to do her home program more but is not able to do it every day. She has been sore but no increase in knee pain.     Pertinent History  breast cancer; hysterectomy    Patient Stated Goals  walk without pain    Currently in Pain?  No/denies    Pain Onset  More than a month ago                       [facility] Adult PT Treatment/Exercise - 08/23/18 0001      Exercises   Exercises  Other Exercises    Other Exercises   attempted quadruped firehydrant x8 reps each (increased trunk rotation noted); quadruper hip extension with ball on back for feedback       Knee/Hip Exercises: Aerobic   Nustep  Seat 7, intervals every minute L5 to L3 x5 minutes       Knee/Hip Exercises: Machines for Strengthening   Total Gym Leg Press  seat 6 BLE #105 x15 reps; single leg #60 x15 reps       Knee/Hip Exercises: Standing   Other Standing Knee Exercises  hip abduction with green TB around feet 2x15 reps each LE; closed chain hip rotation x10 reps (heavy tactile cues needed for technique)      Knee/Hip Exercises: Supine   Bridges  Both;1 set;10 reps    Bridges Limitations  green TB around knees to decrease valgus              PT Education - 08/23/18 1421    Education Details  encouraged increase in HEP adherence; reviewed anatomy of knee and exercise technique impact on knee pain       PT Short Term Goals - 08/09/18 1352      PT SHORT TERM GOAL #1   Title  Pt will demo consistency and independence with her initial HEP to improve exercise understanding and improve LE strength.      Time  4    Period  Weeks    Status  Achieved    Target Date  09/06/18        PT Long Term Goals - 08/09/18 1352      PT LONG TERM GOAL #1   Title  Pt will have increase in BLE strength to 5/5 MMT which will allow for safe return to gym program on her own after d/c.     Baseline  Patient is currently not doing any ex and lacks knowledge of appropriate HEP    Time  10    Period  Weeks    Status  On-going    Target Date  10/05/18      PT LONG TERM GOAL #2   Title  Pt will be able to complete single leg mini squat without UE support and with minimal knee valgus deviation x10 reps which will reflect improvements in LE strength and neuromusular control     Baseline  increased valgus and pain level is 3/10    Time  10    Period  Weeks    Status  On-going      PT LONG TERM GOAL #3   Title  Pt will have atleast 9pt improvement on her LEFS to reflect significant decrease in her LE disability and pain with daily activity.     Baseline   initial eval was 63/80 and update is 70/80    Time  10    Period  Weeks    Status  On-going      PT LONG TERM GOAL #4   Title  Pt will report atleast 50% improvement in her pain with daily activity and have good understanding of safe exercise progressions and activity for the gym.     Baseline  no change in pain due to improper technique    Time  10    Period  Weeks    Status  On-going            Plan - 08/23/18 1440    Clinical Impression Statement  Pt notes no knee pain upon arrival today. Session focused on increasing glute activation and trunk stability. Pt was able to increase resistance with leg press and demonstrated improved confidence with movement and new exercises during today's session. She does have significant difficulty maintaining trunk and hip stability in quadruped, requiring heavy cues to decrease excessive trunk rotation during hip extension. Ended session with a couple of HEP updates to progress hip strength and she  demonstrated understanding. No increase in knee pain was reported.      Rehab Potential  Good    PT Frequency  1x / week    PT Duration  Other (comment)   10 weeks   PT Treatment/Interventions  ADLs/Self Care Home Management;Cryotherapy;Functional mobility training;Therapeutic activities;Therapeutic exercise;Balance training;Patient/family education;Neuromuscular re-education;Manual techniques;Passive range of motion;Taping;Dry needling    PT Next Visit Plan  update HEP with trunk/hip stability in quadruped; focus on hip rotation flexibility and strength; hip abductor strength progression; encourage pt to get back into LE strengthening at the gym; neuromuscular control of hip/knee     PT Home Exercise Plan  Access Code: [email or web address]     Consulted and Agree with Plan of Care  Patient       Patient will benefit from skilled therapeutic intervention in order to improve the following deficits and impairments:  Decreased activity tolerance, Decreased strength, Decreased safety awareness, Impaired flexibility, Pain, Improper body mechanics, Decreased range of motion  Visit Diagnosis: Chronic pain of right knee  Chronic pain of left knee  Muscle weakness (generalized)  Other muscle spasm     Problem List Patient Active Problem List   Diagnosis Date Noted  . Fibroid uterus 01/04/2018  . Fibroid, uterine 08/21/2017  . Port catheter in place 09/06/2016  . Genetic testing 05/09/2016  . Malignant neoplasm of upper-inner quadrant of right breast in female, estrogen receptor positive ([place]) 04/27/2016  . Family history of breast cancer   . Family history of ovarian cancer   . Family history of prostate cancer   . Family history of pancreatic cancer    2:51 PM,08/23/18 Sherol Dade PT, DPT [place] [facility] [address], [place], [place], [address] Phone: [phone]    Fax:  [phone]  Name: Neftaly Inzunza MRN: [id number] Date of Birth: 07-May-1975

## 2018-08-23 NOTE — Patient Instructions (Signed)
Access Code: [email or web address]  URL: [email or web address]  Date: 08/23/2018  Prepared by: Sherol Dade   Exercises  Quadruped Piriformis Stretch - 2 reps - 30 hold - 1x daily - 7x weekly  Bridge with Upper Back on Swiss Ball - 10 reps - 1x daily - 7x weekly  Bridge with Hip Abduction and Resistance - Ground Touches - 10 reps - 2 sets - 1x daily - 7x weekly  Side Stepping with Resistance at Feet - 15 reps - 2 sets - 1x daily - 7x weekly    [facility] [address] [place], [place] [address] Phone # [phone] Fax [phone]

## 2018-08-28 ENCOUNTER — Other Ambulatory Visit: Payer: Self-pay

## 2018-08-28 ENCOUNTER — Ambulatory Visit: Payer: Medicaid Other | Attending: Orthopedic Surgery | Admitting: Physical Therapy

## 2018-08-28 ENCOUNTER — Encounter: Payer: Self-pay | Admitting: Physical Therapy

## 2018-08-28 DIAGNOSIS — M6281 Muscle weakness (generalized): Secondary | ICD-10-CM | POA: Insufficient documentation

## 2018-08-28 DIAGNOSIS — M62838 Other muscle spasm: Secondary | ICD-10-CM | POA: Diagnosis present

## 2018-08-28 DIAGNOSIS — G8929 Other chronic pain: Secondary | ICD-10-CM | POA: Diagnosis present

## 2018-08-28 DIAGNOSIS — M25562 Pain in left knee: Secondary | ICD-10-CM | POA: Insufficient documentation

## 2018-08-28 DIAGNOSIS — M25561 Pain in right knee: Secondary | ICD-10-CM | POA: Insufficient documentation

## 2018-08-28 NOTE — Therapy (Signed)
[facility] [address] [place], [place], [address] Phone: [phone]   Fax:  [phone]  Physical Therapy Treatment  Patient Details  Name: Candice [name] MRN: [id number] Date of Birth: February 11, 1976 Referring Provider (PT): Dorna Leitz, MD    Encounter Date: 08/28/2018  PT End of Session - 08/28/18 1611    Visit Number  6    Date for PT Re-Evaluation  10/05/18    Authorization Type  Medicaid     Authorization Time Period  Medicaid: 10 visits from 07/30/18 to 10/07/18    Authorization - Visit Number  3    Authorization - Number of Visits  10    PT Start Time  5035    PT Stop Time  4656    PT Time Calculation (min)  42 min    Activity Tolerance  Patient tolerated treatment well;No increased pain    Behavior During Therapy  WFL for tasks assessed/performed       Past Medical History:  Diagnosis Date  . Breast cancer ([place]) 05/2016   right  . Chronic constipation   . Family history of cancer   . Fibroid, uterine   . IBS (irritable bowel syndrome)    IBS - C (constipation)  . Personal history of chemotherapy   . Personal history of radiation therapy     Past Surgical History:  Procedure Laterality Date  . BARTHOLIN CYST MARSUPIALIZATION  09/04/2002  . BARTHOLIN GLAND CYST EXCISION Left 07/13/2005  . BREAST LUMPECTOMY WITH RADIOACTIVE SEED AND SENTINEL LYMPH NODE BIOPSY Right 06/07/2016   Procedure: BREAST LUMPECTOMY WITH RADIOACTIVE SEED AND SENTINEL LYMPH NODE BIOPSY;  Surgeon: Alphonsa Overall, MD;  Location: [place];  Service: General;  Laterality: Right;  . HYSTEROSCOPY  2016  . MYOMECTOMY  07/20/2004   Laparotomy  . OVARIAN CYST REMOVAL Right 07/20/2004  . PORTACATH PLACEMENT N/A 07/05/2016   REMOVED Fall 2018 ; Procedure: INSERTION PORT-A-CATH WITH [place];  Surgeon: Alphonsa Overall, MD;  Location: [place];  Service: General;  Laterality: N/A;  . ROBOTIC ASSISTED TOTAL  HYSTERECTOMY WITH BILATERAL SALPINGO OOPHERECTOMY Bilateral 01/04/2018   Procedure: XI ROBOTIC ASSISTED TOTAL HYSTERECTOMY WITH BILATERAL SALPINGO OOPHORECTOMY;  Surgeon: Isabel Caprice, MD;  [facility];  Service: Gynecology;  Laterality: Bilateral;    There were no vitals filed for this visit.  Subjective Assessment - 08/28/18 1609    Subjective  Pt statesh    Pertinent History  breast cancer; hysterectomy    Patient Stated Goals  walk without pain    Pain Onset  More than a month ago                       [facility] Adult PT Treatment/Exercise - 08/28/18 0001      Exercises   Other Exercises   quadruped bird dog x10 reps, x2 reps 5 sec hold       Knee/Hip Exercises: Machines for Strengthening   Total Gym Leg Press  seat 5 BLE #105 x20 reps (slow eccentric); single leg       Knee/Hip Exercises: Standing   Other Standing Knee Exercises  goblet squat with #25 weight plate with glute tap to chair 2x10 reps     Other Standing Knee Exercises  3 way hip slider with green TB x10 reps (intermittent cues for knee control)       Knee/Hip Exercises: Supine   Other Supine Knee/Hip Exercises  leg lock bridge  2x5 reps     Other Supine Knee/Hip Exercises  straight leg bridge 10" box x15 reps              PT Education - 08/28/18 1658    Education Details  technique with therex; updated HEP    Person(s) Educated  Patient    Methods  Explanation;Handout    Comprehension  Verbalized understanding       PT Short Term Goals - 08/28/18 1702      PT SHORT TERM GOAL #1   Title  Pt will demo consistency and independence with her initial HEP to improve exercise understanding and improve LE strength.     Time  4    Period  Weeks    Status  Achieved    Target Date  09/06/18        PT Long Term Goals - 08/09/18 1352      PT LONG TERM GOAL #1   Title  Pt will have increase in BLE strength to 5/5 MMT which will allow for safe return to gym program on her own after  d/c.     Baseline  Patient is currently not doing any ex and lacks knowledge of appropriate HEP    Time  10    Period  Weeks    Status  On-going    Target Date  10/05/18      PT LONG TERM GOAL #2   Title  Pt will be able to complete single leg mini squat without UE support and with minimal knee valgus deviation x10 reps which will reflect improvements in LE strength and neuromusular control     Baseline  increased valgus and pain level is 3/10    Time  10    Period  Weeks    Status  On-going      PT LONG TERM GOAL #3   Title  Pt will have atleast 9pt improvement on her LEFS to reflect significant decrease in her LE disability and pain with daily activity.     Baseline   initial eval was 63/80 and update is 70/80    Time  10    Period  Weeks    Status  On-going      PT LONG TERM GOAL #4   Title  Pt will report atleast 50% improvement in her pain with daily activity and have good understanding of safe exercise progressions and activity for the gym.     Baseline  no change in pain due to improper technique    Time  10    Period  Weeks    Status  On-going            Plan - 08/28/18 1659    Clinical Impression Statement  Pt continues to do well with HEP adherence, noting a decrease in her knee pain with using stairs at home. Single leg press with progressed with 20lb added to prior resistance and pt was able to complete this with minimal difficulty. She demonstrated improved neuromuscular control of the trunk and hip during quadruped exercise, demonstrating minimal trunk rotation during today's attempt. Therapist provides intermittent technique correction with new exercises. Pt had good understanding of HEP updates and reported no increase in knee pain end of session.     Rehab Potential  Good    PT Frequency  1x / week    PT Duration  Other (comment)   10 weeks   PT Treatment/Interventions  ADLs/Self Care Home Management;Cryotherapy;Functional mobility training;Therapeutic  activities;Therapeutic exercise;Balance training;Patient/family education;Neuromuscular re-education;Manual techniques;Passive range of motion;Taping;Dry needling    PT Next Visit Plan  3 way hip without UE; step down control; focus on hip rotation flexibility and strength; hip abductor strength progression; encourage pt to get back into LE strengthening at the gym; neuromuscular control of hip/knee     PT Home Exercise Plan  Access Code: [email or web address]     Consulted and Agree with Plan of Care  Patient       Patient will benefit from skilled therapeutic intervention in order to improve the following deficits and impairments:  Decreased activity tolerance, Decreased strength, Decreased safety awareness, Impaired flexibility, Pain, Improper body mechanics, Decreased range of motion  Visit Diagnosis: Chronic pain of right knee  Chronic pain of left knee  Muscle weakness (generalized)  Other muscle spasm     Problem List Patient Active Problem List   Diagnosis Date Noted  . Fibroid uterus 01/04/2018  . Fibroid, uterine 08/21/2017  . Port catheter in place 09/06/2016  . Genetic testing 05/09/2016  . Malignant neoplasm of upper-inner quadrant of right breast in female, estrogen receptor positive ([place]) 04/27/2016  . Family history of breast cancer   . Family history of ovarian cancer   . Family history of prostate cancer   . Family history of pancreatic cancer    5:03 PM,08/28/18 Sherol Dade PT, DPT [place] [facility] [address] [place], [place], [address] Phone: [phone]   Fax:  [phone]  Name: Candice [name] MRN: [id number] Date of Birth: 03/24/1976

## 2018-08-28 NOTE — Patient Instructions (Signed)
Access Code: [email or web address]  URL: [email or web address]  Date: 08/28/2018  Prepared by: Sherol Dade   Exercises  Quadruped Piriformis Stretch - 2 reps - 30 hold - 1x daily - 7x weekly  Bridge with Upper Back on Swiss Ball - 10 reps - 1x daily - 7x weekly  Bridge with Hip Abduction and Resistance - Ground Touches - 10 reps - 2 sets - 1x daily - 7x weekly  Side Stepping with Resistance at Feet - 15 reps - 2 sets - 1x daily - 7x weekly  Bird Dog - 7x weekly  Single Leg Bridge with Leg Supported - 5 reps - 3 sets - 1x daily - 7x weekly    4:58 PM,08/28/18 Sherol Dade PT, DPT [place] [facility] [place]

## 2018-08-29 ENCOUNTER — Other Ambulatory Visit: Payer: Medicaid Other

## 2018-08-29 ENCOUNTER — Ambulatory Visit: Payer: Medicaid Other | Admitting: Oncology

## 2018-08-31 ENCOUNTER — Other Ambulatory Visit: Payer: Self-pay | Admitting: Oncology

## 2018-08-31 DIAGNOSIS — Z170 Estrogen receptor positive status [ER+]: Secondary | ICD-10-CM

## 2018-08-31 DIAGNOSIS — C50211 Malignant neoplasm of upper-inner quadrant of right female breast: Secondary | ICD-10-CM

## 2018-09-04 ENCOUNTER — Ambulatory Visit: Payer: Medicaid Other | Admitting: Physical Therapy

## 2018-09-04 ENCOUNTER — Encounter: Payer: Self-pay | Admitting: Physical Therapy

## 2018-09-04 ENCOUNTER — Other Ambulatory Visit: Payer: Self-pay

## 2018-09-04 DIAGNOSIS — M25561 Pain in right knee: Secondary | ICD-10-CM | POA: Diagnosis not present

## 2018-09-04 DIAGNOSIS — M6281 Muscle weakness (generalized): Secondary | ICD-10-CM

## 2018-09-04 DIAGNOSIS — G8929 Other chronic pain: Secondary | ICD-10-CM

## 2018-09-04 DIAGNOSIS — M62838 Other muscle spasm: Secondary | ICD-10-CM

## 2018-09-04 IMAGING — MG 2D DIGITAL SCREENING BILATERAL MAMMOGRAM WITH CAD AND ADJUNCT TO
9 of 12 series · 9 of 28 positions shown · non-contrast
Comparison: None

CLINICAL DATA: Screening.

EXAM:
2D DIGITAL SCREENING BILATERAL MAMMOGRAM WITH CAD AND ADJUNCT TOMO

[R CC]
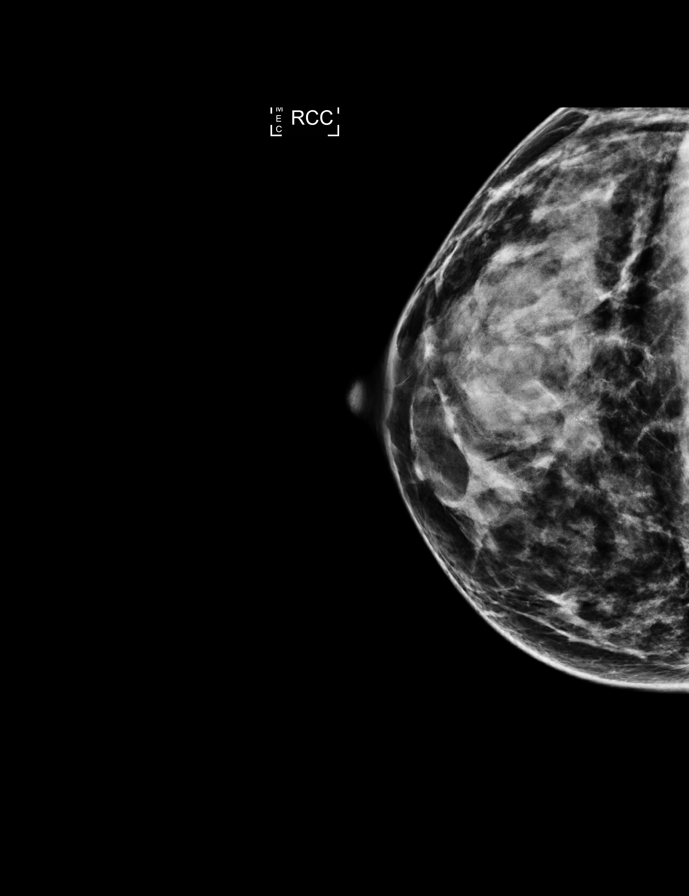

[L MLO synth-2D]
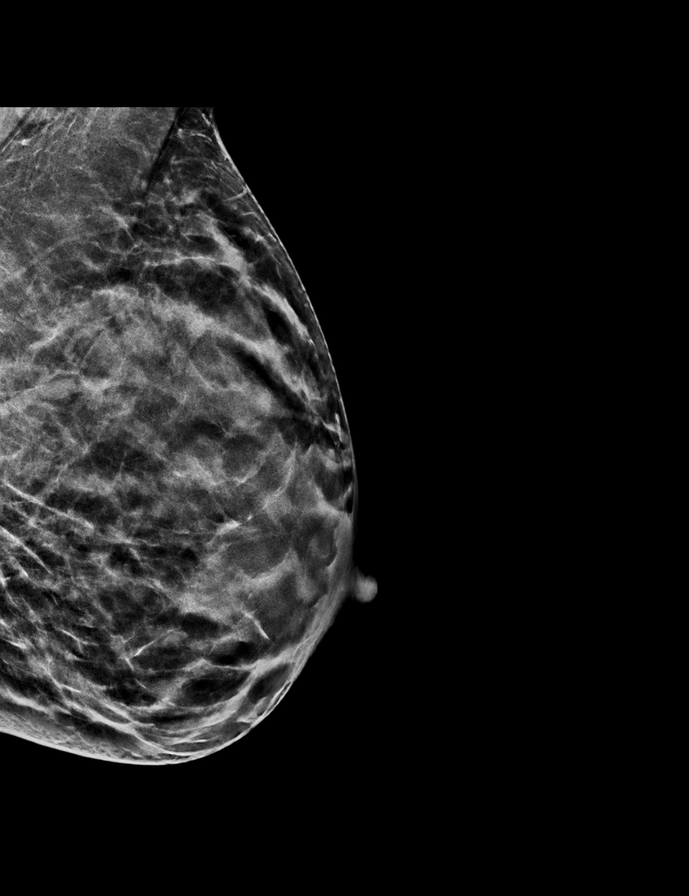

[L CC]
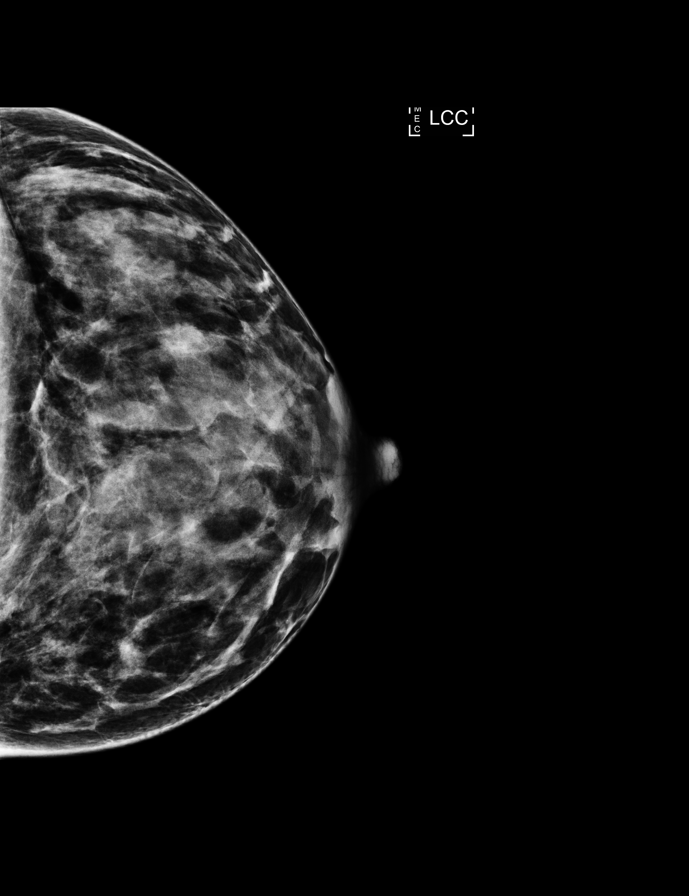

[R MLO]
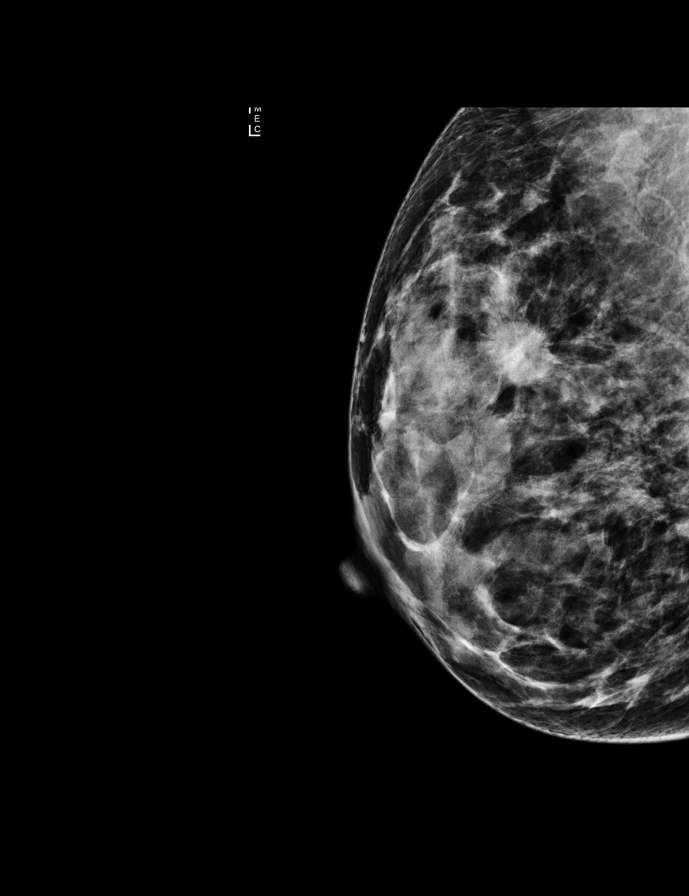

[R MLO synth-2D]
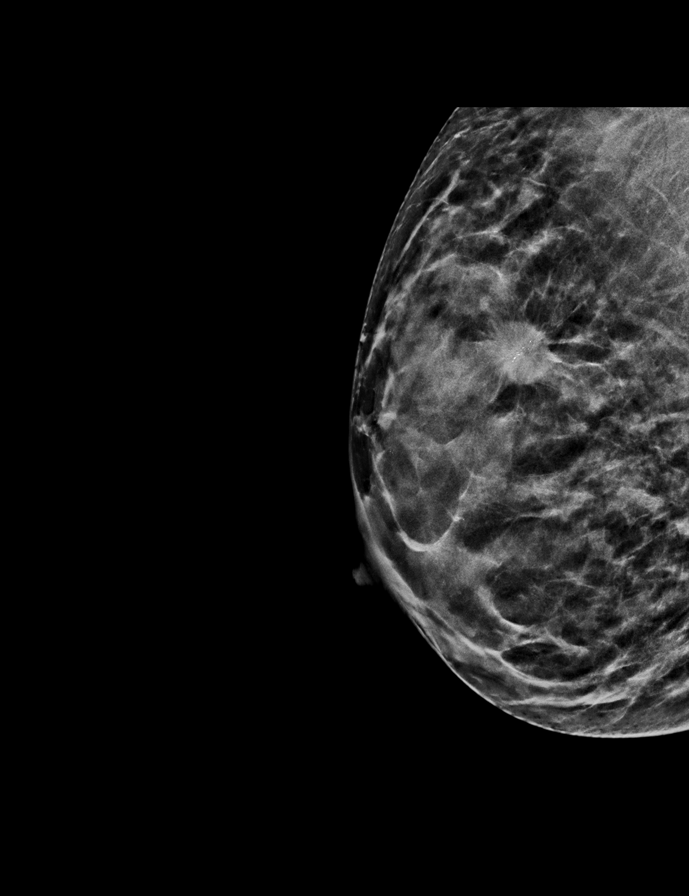

[R CC synth-2D]
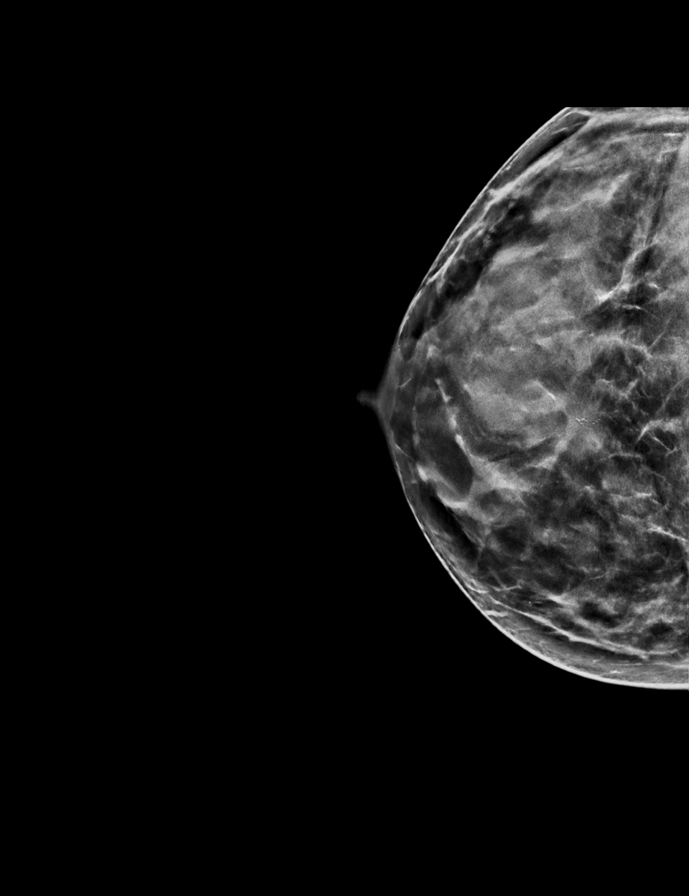

[L CC synth-2D]
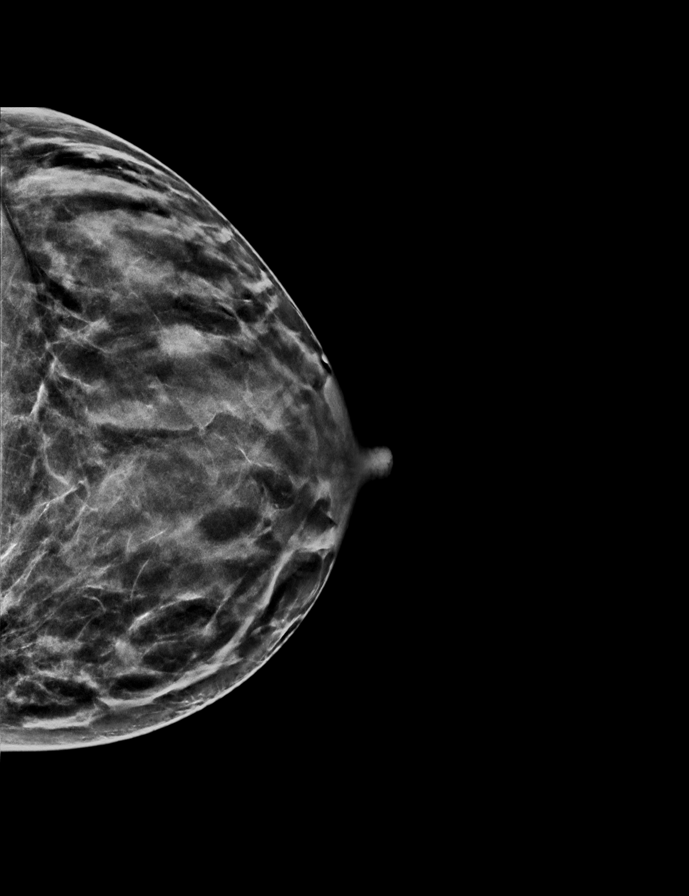

[L MLO]
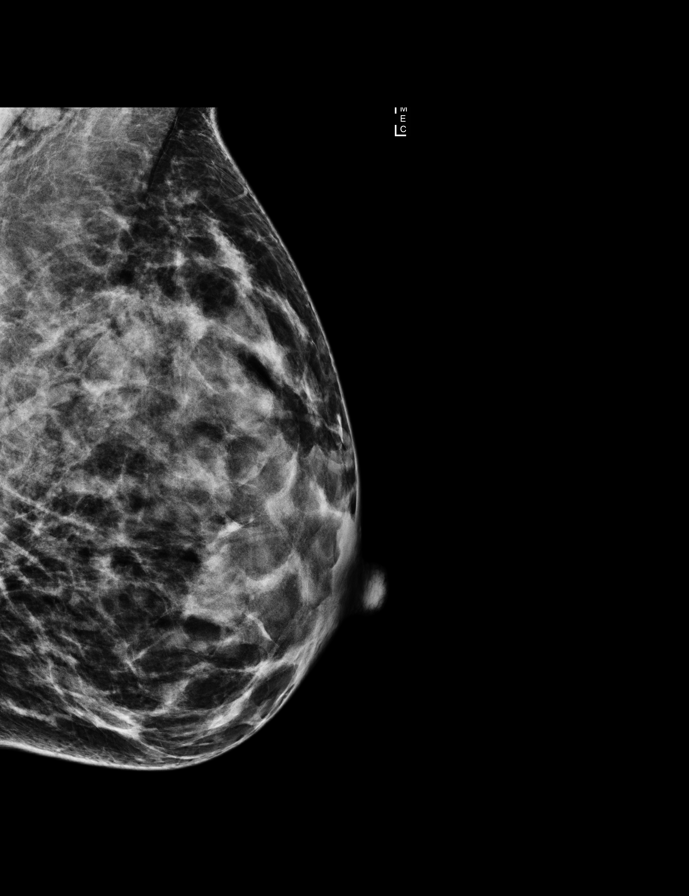

[L CC tomo · tomo slice 28/55.0]
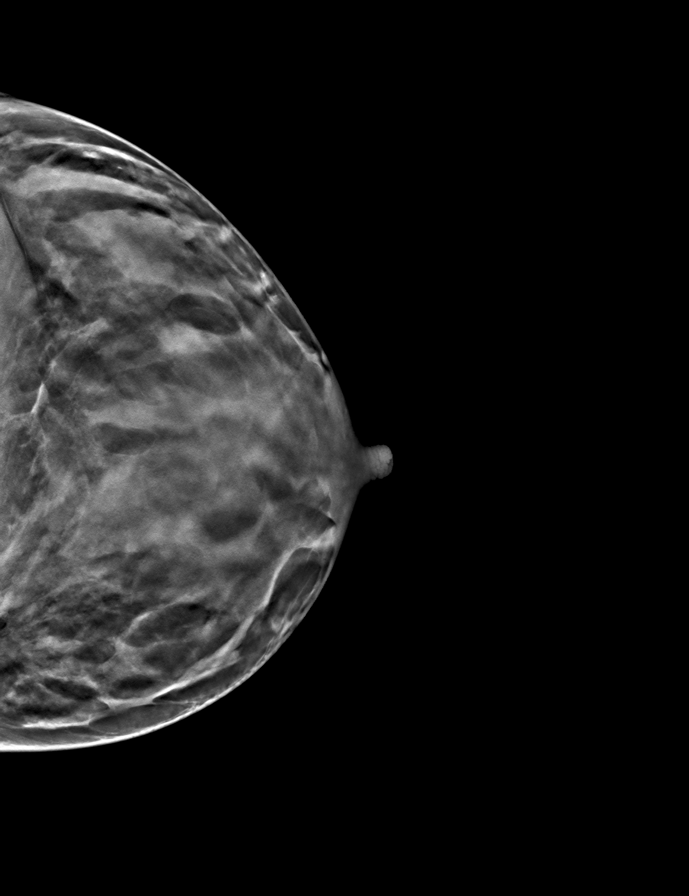

[9 of 28 positions shown; findings below may reference images not displayed]

ACR Breast Density Category d: The breast tissue is extremely dense,
which lowers the sensitivity of mammography.
FINDINGS: In the right breast, a possible mass and calcifications warrant
further evaluation. In the left breast, no findings suspicious for
malignancy. Images were processed with CAD.
IMPRESSION: Further evaluation is suggested for possible mass with
calcifications in the right breast.

RECOMMENDATION:
Diagnostic mammogram and possibly ultrasound of the right breast.
(Code:[id number])

The patient will be contacted regarding the findings, and additional
imaging will be scheduled.

BI-RADS CATEGORY  0: Incomplete. Need additional imaging evaluation
and/or prior mammograms for comparison.

## 2018-09-04 NOTE — Addendum Note (Signed)
Addended bySherol Dade on: 09/04/2018 04:58 PM   Modules accepted: Orders

## 2018-09-04 NOTE — Patient Instructions (Signed)
Access Code: [email or web address]  URL: [email or web address]  Date: 09/04/2018  Prepared by: Sherol Dade   Exercises  Quadruped Piriformis Stretch - 2 reps - 30 hold - 1x daily - 7x weekly  Side Stepping with Resistance at Feet - 15 reps - 2 sets - 1x daily - 7x weekly  Bird Dog - 7x weekly  Single Leg Bridge with Leg Supported - 5 reps - 3 sets - 1x daily - 7x weekly  Standing Hip Extension with Resistance - 15 reps - 2 sets - 1x daily - 7x weekly     [facility] [address] [place], [place] [address] Phone # [phone] Fax [phone]

## 2018-09-04 NOTE — Therapy (Signed)
[facility] [address] [place], [place], [address] Phone: [phone]   Fax:  [phone]  Physical Therapy Treatment  Patient Details  Name: Nakeeta Sebastiani MRN: [id number] Date of Birth: 07/04/1975 Referring Provider (PT): Dorna Leitz, MD    Encounter Date: 09/04/2018  PT End of Session - 09/04/18 1120    Visit Number  7    Date for PT Re-Evaluation  10/05/18    Authorization Type  Medicaid     Authorization Time Period  Medicaid: 10 visits from 07/30/18 to 10/07/18    Authorization - Visit Number  4    Authorization - Number of Visits  10    PT Start Time  2751    PT Stop Time  7001    PT Time Calculation (min)  43 min    Activity Tolerance  Patient tolerated treatment well;No increased pain    Behavior During Therapy  WFL for tasks assessed/performed       Past Medical History:  Diagnosis Date  . Breast cancer ([place]) 05/2016   right  . Chronic constipation   . Family history of cancer   . Fibroid, uterine   . IBS (irritable bowel syndrome)    IBS - C (constipation)  . Personal history of chemotherapy   . Personal history of radiation therapy     Past Surgical History:  Procedure Laterality Date  . BARTHOLIN CYST MARSUPIALIZATION  09/04/2002  . BARTHOLIN GLAND CYST EXCISION Left 07/13/2005  . BREAST LUMPECTOMY WITH RADIOACTIVE SEED AND SENTINEL LYMPH NODE BIOPSY Right 06/07/2016   Procedure: BREAST LUMPECTOMY WITH RADIOACTIVE SEED AND SENTINEL LYMPH NODE BIOPSY;  Surgeon: Alphonsa Overall, MD;  Location: [place];  Service: General;  Laterality: Right;  . HYSTEROSCOPY  2016  . MYOMECTOMY  07/20/2004   Laparotomy  . OVARIAN CYST REMOVAL Right 07/20/2004  . PORTACATH PLACEMENT N/A 07/05/2016   REMOVED Fall 2018 ; Procedure: INSERTION PORT-A-CATH WITH [place];  Surgeon: Alphonsa Overall, MD;  Location: [place];  Service: General;  Laterality: N/A;  . ROBOTIC ASSISTED TOTAL  HYSTERECTOMY WITH BILATERAL SALPINGO OOPHERECTOMY Bilateral 01/04/2018   Procedure: XI ROBOTIC ASSISTED TOTAL HYSTERECTOMY WITH BILATERAL SALPINGO OOPHORECTOMY;  Surgeon: Isabel Caprice, MD;  [facility];  Service: Gynecology;  Laterality: Bilateral;    There were no vitals filed for this visit.  Subjective Assessment - 09/04/18 1105    Subjective  Pt states that she is doing great. She is noticing less pain when she uses the stairs.     Pertinent History  breast cancer; hysterectomy    Patient Stated Goals  walk without pain    Currently in Pain?  No/denies    Pain Onset  More than a month ago                       [facility] Adult PT Treatment/Exercise - 09/04/18 0001      Knee/Hip Exercises: Aerobic   Elliptical  x9 min, 4 min forward L4 and 5 min backward L4      Knee/Hip Exercises: Machines for Strengthening   Total Gym Leg Press  seat 5 #120 BLE x20 reps; #90 single leg 3 sec eccentric 2x15 reps       Knee/Hip Exercises: Standing   Hip Extension  Both;Stengthening;1 set;15 reps    Extension Limitations  red TB (abdominal brace encouraged)       Knee/Hip Exercises: Supine   Single Leg Bridge  Strengthening;Both;1 set;10 reps   leg lock    Other Supine Knee/Hip Exercises  straight leg bridge with BLE over red physioball 2x15 reps, hamstring bridge with curl 2x10 reps              PT Education - 09/04/18 1146    Education Details  updated and reviewed HEP    Person(s) Educated  Patient    Methods  Explanation;Handout    Comprehension  Verbalized understanding;Returned demonstration       PT Short Term Goals - 08/28/18 1702      PT SHORT TERM GOAL #1   Title  Pt will demo consistency and independence with her initial HEP to improve exercise understanding and improve LE strength.     Time  4    Period  Weeks    Status  Achieved    Target Date  09/06/18        PT Long Term Goals - 08/09/18 1352      PT LONG TERM GOAL #1   Title  Pt will  have increase in BLE strength to 5/5 MMT which will allow for safe return to gym program on her own after d/c.     Baseline  Patient is currently not doing any ex and lacks knowledge of appropriate HEP    Time  10    Period  Weeks    Status  On-going    Target Date  10/05/18      PT LONG TERM GOAL #2   Title  Pt will be able to complete single leg mini squat without UE support and with minimal knee valgus deviation x10 reps which will reflect improvements in LE strength and neuromusular control     Baseline  increased valgus and pain level is 3/10    Time  10    Period  Weeks    Status  On-going      PT LONG TERM GOAL #3   Title  Pt will have atleast 9pt improvement on her LEFS to reflect significant decrease in her LE disability and pain with daily activity.     Baseline   initial eval was 63/80 and update is 70/80    Time  10    Period  Weeks    Status  On-going      PT LONG TERM GOAL #4   Title  Pt will report atleast 50% improvement in her pain with daily activity and have good understanding of safe exercise progressions and activity for the gym.     Baseline  no change in pain due to improper technique    Time  10    Period  Weeks    Status  On-going            Plan - 09/04/18 1147    Clinical Impression Statement  Pt continues to note improvements in her knee pain with daily activity. She is doing her HEP without difficulty although requested review of the leg lock bridge. Therapist was able to review set up with this and make necessary adjustments with pt demonstrating proper technique. Pt had difficulty with straight leg bridged and hamstring curls due to weakness, but reported no increase in knee pain end of session. Will continue to progress LE strength and flexibility for further participation in daily activity and exercise without exacerbation of pain.     Rehab Potential  Good    PT Frequency  1x / week    PT Duration  Other (comment)  10 weeks   PT  Treatment/Interventions  ADLs/Self Care Home Management;Cryotherapy;Functional mobility training;Therapeutic activities;Therapeutic exercise;Balance training;Patient/family education;Neuromuscular re-education;Manual techniques;Passive range of motion;Taping;Dry needling    PT Next Visit Plan  3 way hip without UE; step down control; TB to quadruped hip extension; focus on hip rotation flexibility and strength; hip abductor strength progression; encourage pt to get back into LE strengthening at the gym; neuromuscular control of hip/knee     PT Home Exercise Plan  Access Code: [email or web address]     Consulted and Agree with Plan of Care  Patient       Patient will benefit from skilled therapeutic intervention in order to improve the following deficits and impairments:  Decreased activity tolerance, Decreased strength, Decreased safety awareness, Impaired flexibility, Pain, Improper body mechanics, Decreased range of motion  Visit Diagnosis: Chronic pain of right knee  Chronic pain of left knee  Muscle weakness (generalized)  Other muscle spasm     Problem List Patient Active Problem List   Diagnosis Date Noted  . Fibroid uterus 01/04/2018  . Fibroid, uterine 08/21/2017  . Port catheter in place 09/06/2016  . Genetic testing 05/09/2016  . Malignant neoplasm of upper-inner quadrant of right breast in female, estrogen receptor positive ([place]) 04/27/2016  . Family history of breast cancer   . Family history of ovarian cancer   . Family history of prostate cancer   . Family history of pancreatic cancer     1:48 PM,09/04/18 Sherol Dade PT, DPT [place] [facility] [place]  [facility] [address], [place], [place], [address] Phone: [phone]   Fax:  [phone]  Name: Donicia Druck MRN: [id number] Date of Birth: [birth year]

## 2018-09-07 ENCOUNTER — Ambulatory Visit: Payer: Medicaid Other | Admitting: Physical Therapy

## 2018-09-09 ENCOUNTER — Other Ambulatory Visit: Payer: Self-pay | Admitting: Oncology

## 2018-09-11 ENCOUNTER — Ambulatory Visit: Payer: Medicaid Other | Admitting: Physical Therapy

## 2018-09-11 ENCOUNTER — Encounter: Payer: Self-pay | Admitting: Physical Therapy

## 2018-09-11 ENCOUNTER — Other Ambulatory Visit: Payer: Self-pay

## 2018-09-11 DIAGNOSIS — M62838 Other muscle spasm: Secondary | ICD-10-CM

## 2018-09-11 DIAGNOSIS — G8929 Other chronic pain: Secondary | ICD-10-CM

## 2018-09-11 DIAGNOSIS — M25562 Pain in left knee: Secondary | ICD-10-CM

## 2018-09-11 DIAGNOSIS — M6281 Muscle weakness (generalized): Secondary | ICD-10-CM

## 2018-09-11 DIAGNOSIS — M25561 Pain in right knee: Secondary | ICD-10-CM | POA: Diagnosis not present

## 2018-09-11 NOTE — Therapy (Signed)
[facility] [address] [place], [place], [address] Phone: [phone]   Fax:  [phone]  Physical Therapy Treatment  Patient Details  Name: Candice [name] MRN: [id number] Date of Birth: 03/04/1976 Referring Provider (PT): Dorna Leitz, MD    Encounter Date: 09/11/2018  PT End of Session - 09/11/18 1114    Visit Number  8    Date for PT Re-Evaluation  10/05/18    Authorization Type  Medicaid     Authorization Time Period  Medicaid: 10 visits from 07/30/18 to 10/07/18    Authorization - Visit Number  5    Authorization - Number of Visits  10    PT Start Time  2831    PT Stop Time  1146    PT Time Calculation (min)  38 min    Activity Tolerance  Patient tolerated treatment well;No increased pain    Behavior During Therapy  WFL for tasks assessed/performed       Past Medical History:  Diagnosis Date  . Breast cancer ([place]) 05/2016   right  . Chronic constipation   . Family history of cancer   . Fibroid, uterine   . IBS (irritable bowel syndrome)    IBS - C (constipation)  . Personal history of chemotherapy   . Personal history of radiation therapy     Past Surgical History:  Procedure Laterality Date  . BARTHOLIN CYST MARSUPIALIZATION  09/04/2002  . BARTHOLIN GLAND CYST EXCISION Left 07/13/2005  . BREAST LUMPECTOMY WITH RADIOACTIVE SEED AND SENTINEL LYMPH NODE BIOPSY Right 06/07/2016   Procedure: BREAST LUMPECTOMY WITH RADIOACTIVE SEED AND SENTINEL LYMPH NODE BIOPSY;  Surgeon: Alphonsa Overall, MD;  Location: [place];  Service: General;  Laterality: Right;  . HYSTEROSCOPY  2016  . MYOMECTOMY  07/20/2004   Laparotomy  . OVARIAN CYST REMOVAL Right 07/20/2004  . PORTACATH PLACEMENT N/A 07/05/2016   REMOVED Fall 2018 ; Procedure: INSERTION PORT-A-CATH WITH [place];  Surgeon: Alphonsa Overall, MD;  Location: [place];  Service: General;  Laterality: N/A;  . ROBOTIC ASSISTED TOTAL  HYSTERECTOMY WITH BILATERAL SALPINGO OOPHERECTOMY Bilateral 01/04/2018   Procedure: XI ROBOTIC ASSISTED TOTAL HYSTERECTOMY WITH BILATERAL SALPINGO OOPHORECTOMY;  Surgeon: Isabel Caprice, MD;  [facility];  Service: Gynecology;  Laterality: Bilateral;    There were no vitals filed for this visit.  Subjective Assessment - 09/11/18 1109    Subjective  Pt states that things are going well. She was not able to complete her HEP because she was busy moving.    Pertinent History  breast cancer; hysterectomy    Patient Stated Goals  walk without pain    Currently in Pain?  No/denies    Pain Onset  More than a month ago         [facility] PT Assessment - 09/11/18 0001      Observation/Other Assessments   Lower Extremity Functional Scale   72/80                   [facility] Adult PT Treatment/Exercise - 09/11/18 0001      Exercises   Other Exercises   quadruped rocking back with abdominal brace (intermittent cuing for maintains abdominal activation) x20 reps       Knee/Hip Exercises: Aerobic   Elliptical  x3 min forward, x3 min backward resistance L3      Knee/Hip Exercises: Standing   Step Down  Both;2 sets;Hand Hold: 1;Step Height: 4";15 reps  Step Down Limitations  1 set forward, 1 set lateral with verbal and tactile cues for knee control     Other Standing Knee Exercises  #25 goblet squat 2x20 reps     Other Standing Knee Exercises  single leg sit 2x10 reps (1st set holding 5# weight) mirror feedback to decrease knee valgus deviation       Knee/Hip Exercises: Supine   Other Supine Knee/Hip Exercises  straight leg bridge with BLE over red physioball 2x15 reps, hamstring bridge with curl 2x10 reps              PT Education - 09/11/18 1251    Education Details  technique with therex; updated and reviewed HEP    Person(s) Educated  Patient    Methods  Explanation;Tactile cues;Verbal cues    Comprehension  Verbalized understanding;Returned demonstration       PT  Short Term Goals - 08/28/18 1702      PT SHORT TERM GOAL #1   Title  Pt will demo consistency and independence with her initial HEP to improve exercise understanding and improve LE strength.     Time  4    Period  Weeks    Status  Achieved    Target Date  09/06/18        PT Long Term Goals - 09/11/18 1140      PT LONG TERM GOAL #1   Title  Pt will have increase in BLE strength to 5/5 MMT which will allow for safe return to gym program on her own after d/c.     Baseline  pt consistently following her HEP    Time  10    Period  Weeks    Status  On-going      PT LONG TERM GOAL #2   Title  Pt will be able to complete single leg mini squat without UE support and with minimal knee valgus deviation x10 reps which will reflect improvements in LE strength and neuromusular control     Baseline  able to complete without pain, knee valgus noted    Time  10    Period  Weeks    Status  Partially Met      PT LONG TERM GOAL #3   Title  Pt will have atleast 9pt improvement on her LEFS to reflect significant decrease in her LE disability and pain with daily activity.     Baseline   initial eval was 63/80 and update is 70/80    Time  10    Period  Weeks    Status  On-going      PT LONG TERM GOAL #4   Title  Pt will report atleast 50% improvement in her pain with daily activity and have good understanding of safe exercise progressions and activity for the gym.     Baseline  no change in pain due to improper technique    Time  10    Period  Weeks    Status  On-going            Plan - 09/11/18 1246    Clinical Impression Statement  Pt is making steady progress towards her long term goals, demonstrating atleast 2 points improvement in the lower extremity functional scale from last assessment. She is able to complete squats during her sessions without increase in knee pain, but does reportedly have difficulty with higher level activity such as jogging, hopping and repeated lifting at home.  Pt demonstrated improved pelvic/lumbar control during  quadruped activity but will continue to need reinforcements in standing positions. Minimal updates were made to her HEP to allow for further attention to previous updates.    Rehab Potential  Good    PT Frequency  1x / week    PT Duration  Other (comment)   10 weeks   PT Treatment/Interventions  ADLs/Self Care Home Management;Cryotherapy;Functional mobility training;Therapeutic activities;Therapeutic exercise;Balance training;Patient/family education;Neuromuscular re-education;Manual techniques;Passive range of motion;Taping;Dry needling    PT Next Visit Plan  3 way hip without UE; step down control; hip rotation PROM/AROM, TB to quadruped hip extension    PT Home Exercise Plan  Access Code: [email or web address]     Consulted and Agree with Plan of Care  Patient       Patient will benefit from skilled therapeutic intervention in order to improve the following deficits and impairments:  Decreased activity tolerance, Decreased strength, Decreased safety awareness, Impaired flexibility, Pain, Improper body mechanics, Decreased range of motion  Visit Diagnosis: 1. Chronic pain of right knee   2. Chronic pain of left knee   3. Muscle weakness (generalized)   4. Other muscle spasm        Problem List Patient Active Problem List   Diagnosis Date Noted  . Fibroid uterus 01/04/2018  . Fibroid, uterine 08/21/2017  . Port catheter in place 09/06/2016  . Genetic testing 05/09/2016  . Malignant neoplasm of upper-inner quadrant of right breast in female, estrogen receptor positive ([place]) 04/27/2016  . Family history of breast cancer   . Family history of ovarian cancer   . Family history of prostate cancer   . Family history of pancreatic cancer     12:51 PM,09/11/18 Sherol Dade PT, DPT [place] [facility] [place] [facility] [address] [place], [place], [address] Phone: [phone]   Fax:  [phone]  Name: Candice [name] MRN: [id number] Date of Birth: 02/03/1976

## 2018-09-11 NOTE — Patient Instructions (Signed)
Access Code: [email or web address]  URL: [email or web address]  Date: 09/11/2018  Prepared by: Sherol Dade   Exercises  Quadruped Piriformis Stretch - 2 reps - 30 hold - 1x daily - 7x weekly  Side Stepping with Resistance at Feet - 15 reps - 2 sets - 1x daily - 7x weekly  Bird Dog - 7x weekly  Single Leg Bridge with Leg Supported - 5 reps - 3 sets - 1x daily - 7x weekly  Standing Hip Extension with Resistance - 15 reps - 2 sets - 1x daily - 7x weekly  Quadruped Rocking Backward - 15 reps - 1x daily - 7x weekly    [facility] [address], [place], [place] [address] Phone # [phone] Fax [phone]

## 2018-09-17 DIAGNOSIS — R198 Other specified symptoms and signs involving the digestive system and abdomen: Secondary | ICD-10-CM | POA: Insufficient documentation

## 2018-09-17 DIAGNOSIS — K219 Gastro-esophageal reflux disease without esophagitis: Secondary | ICD-10-CM | POA: Insufficient documentation

## 2018-09-17 DIAGNOSIS — R0989 Other specified symptoms and signs involving the circulatory and respiratory systems: Secondary | ICD-10-CM | POA: Insufficient documentation

## 2018-09-18 ENCOUNTER — Encounter: Payer: Self-pay | Admitting: Physical Therapy

## 2018-09-18 ENCOUNTER — Other Ambulatory Visit: Payer: Self-pay

## 2018-09-18 ENCOUNTER — Ambulatory Visit: Payer: Medicaid Other | Admitting: Physical Therapy

## 2018-09-18 DIAGNOSIS — M25562 Pain in left knee: Secondary | ICD-10-CM

## 2018-09-18 DIAGNOSIS — M6281 Muscle weakness (generalized): Secondary | ICD-10-CM

## 2018-09-18 DIAGNOSIS — M25561 Pain in right knee: Secondary | ICD-10-CM | POA: Diagnosis not present

## 2018-09-18 DIAGNOSIS — G8929 Other chronic pain: Secondary | ICD-10-CM

## 2018-09-18 DIAGNOSIS — M62838 Other muscle spasm: Secondary | ICD-10-CM

## 2018-09-18 NOTE — Therapy (Signed)
[facility] [address], [place], [place], [address] Phone: [phone]   Fax:  [phone]  Physical Therapy Treatment  Patient Details  Name: Candice [name] MRN: [id number] Date of Birth: 09/03/1975 Referring Provider (PT): Dorna Leitz, MD    Encounter Date: 09/18/2018  PT End of Session - 09/18/18 1217    Visit Number  9    Date for PT Re-Evaluation  10/05/18    Authorization Type  Medicaid     Authorization Time Period  Medicaid: 10 visits from 07/30/18 to 10/07/18    Authorization - Visit Number  6    Authorization - Number of Visits  10    PT Start Time  1101    PT Stop Time  4315    PT Time Calculation (min)  42 min    Activity Tolerance  Patient tolerated treatment well;No increased pain    Behavior During Therapy  WFL for tasks assessed/performed       Past Medical History:  Diagnosis Date  . Breast cancer ([place]) 05/2016   right  . Chronic constipation   . Family history of cancer   . Fibroid, uterine   . IBS (irritable bowel syndrome)    IBS - C (constipation)  . Personal history of chemotherapy   . Personal history of radiation therapy     Past Surgical History:  Procedure Laterality Date  . BARTHOLIN CYST MARSUPIALIZATION  09/04/2002  . BARTHOLIN GLAND CYST EXCISION Left 07/13/2005  . BREAST LUMPECTOMY WITH RADIOACTIVE SEED AND SENTINEL LYMPH NODE BIOPSY Right 06/07/2016   Procedure: BREAST LUMPECTOMY WITH RADIOACTIVE SEED AND SENTINEL LYMPH NODE BIOPSY;  Surgeon: Alphonsa Overall, MD;  Location: [place];  Service: General;  Laterality: Right;  . HYSTEROSCOPY  2016  . MYOMECTOMY  07/20/2004   Laparotomy  . OVARIAN CYST REMOVAL Right 07/20/2004  . PORTACATH PLACEMENT N/A 07/05/2016   REMOVED Fall 2018 ; Procedure: INSERTION PORT-A-CATH WITH [place];  Surgeon: Alphonsa Overall, MD;  Location: [place];  Service: General;  Laterality: N/A;  . ROBOTIC ASSISTED TOTAL  HYSTERECTOMY WITH BILATERAL SALPINGO OOPHERECTOMY Bilateral 01/04/2018   Procedure: XI ROBOTIC ASSISTED TOTAL HYSTERECTOMY WITH BILATERAL SALPINGO OOPHORECTOMY;  Surgeon: Isabel Caprice, MD;  [facility];  Service: Gynecology;  Laterality: Bilateral;    There were no vitals filed for this visit.  Subjective Assessment - 09/18/18 1103    Subjective  Pt says things are going well. She had some soreness with going up and down the stairs.    Pertinent History  breast cancer; hysterectomy    Patient Stated Goals  walk without pain    Currently in Pain?  No/denies    Pain Onset  More than a month ago                       [facility] Adult PT Treatment/Exercise - 09/18/18 0001      Knee/Hip Exercises: Stretches   Sports administrator  Both;1 rep;30 seconds    Quad Stretch Limitations  kneeling      Knee/Hip Exercises: Aerobic   Elliptical  x3 min forward, x3 min backward resistance L3      Knee/Hip Exercises: Standing   Other Standing Knee Exercises  sidestepping with green TB around toes 2x15 reps each     Other Standing Knee Exercises  3 way hip slider without UE support x10 reps (increased difficulty with knee valgus on Lt)  Knee/Hip Exercises: Seated   Sit to Sand  2 sets;10 reps   with toe wedge to encourage posterior weight shift      Knee/Hip Exercises: Supine   Bridges  Strengthening;Both;2 sets;5 reps    Bridges Limitations  atlernating LE march    Other Supine Knee/Hip Exercises  straight leg bridge over red physioball x15 reps; BLE hamstring bridge curls with red physioball x15 reps       Knee/Hip Exercises: Sidelying   Clams  red TB 2x10 reps with focus on abdominal activation             PT Education - 09/18/18 1142    Education Details  updated HEP    Person(s) Educated  Patient    Methods  Explanation;Handout    Comprehension  Verbalized understanding;Verbal cues required       PT Short Term Goals - 08/28/18 1702      PT SHORT TERM GOAL  #1   Title  Pt will demo consistency and independence with her initial HEP to improve exercise understanding and improve LE strength.     Time  4    Period  Weeks    Status  Achieved    Target Date  09/06/18        PT Long Term Goals - 09/11/18 1140      PT LONG TERM GOAL #1   Title  Pt will have increase in BLE strength to 5/5 MMT which will allow for safe return to gym program on her own after d/c.     Baseline  pt consistently following her HEP    Time  10    Period  Weeks    Status  On-going      PT LONG TERM GOAL #2   Title  Pt will be able to complete single leg mini squat without UE support and with minimal knee valgus deviation x10 reps which will reflect improvements in LE strength and neuromusular control     Baseline  able to complete without pain, knee valgus noted    Time  10    Period  Weeks    Status  Partially Met      PT LONG TERM GOAL #3   Title  Pt will have atleast 9pt improvement on her LEFS to reflect significant decrease in her LE disability and pain with daily activity.     Baseline   initial eval was 63/80 and update is 70/80    Time  10    Period  Weeks    Status  On-going      PT LONG TERM GOAL #4   Title  Pt will report atleast 50% improvement in her pain with daily activity and have good understanding of safe exercise progressions and activity for the gym.     Baseline  no change in pain due to improper technique    Time  10    Period  Weeks    Status  On-going            Plan - 09/18/18 1217    Clinical Impression Statement  Pt was able to increase HEP adherence over the past week. She arrived without knee pain. Focused on increasing hip rotary strength with clamshells. Pt had difficulty maintaining abdominal brace initially, but this improved with external cues. Pt was able to progress her bridge to alternating LE march, but demonstrated some trunk rotation with this. Will continue to progress hip strength and flexibility moving forward.  Rehab Potential  Good    PT Frequency  1x / week    PT Duration  Other (comment)   10 weeks   PT Treatment/Interventions  ADLs/Self Care Home Management;Cryotherapy;Functional mobility training;Therapeutic activities;Therapeutic exercise;Balance training;Patient/family education;Neuromuscular re-education;Manual techniques;Passive range of motion;Taping;Dry needling    PT Next Visit Plan  f/u on clamshell; 3 way hip without UE; step down control; hip rotation PROM/AROM, TB to quadruped hip extension    PT Home Exercise Plan  Access Code: [email or web address]     Consulted and Agree with Plan of Care  Patient       Patient will benefit from skilled therapeutic intervention in order to improve the following deficits and impairments:  Decreased activity tolerance, Decreased strength, Decreased safety awareness, Impaired flexibility, Pain, Improper body mechanics, Decreased range of motion  Visit Diagnosis: 1. Chronic pain of right knee   2. Chronic pain of left knee   3. Muscle weakness (generalized)   4. Other muscle spasm        Problem List Patient Active Problem List   Diagnosis Date Noted  . Fibroid uterus 01/04/2018  . Fibroid, uterine 08/21/2017  . Port catheter in place 09/06/2016  . Genetic testing 05/09/2016  . Malignant neoplasm of upper-inner quadrant of right breast in female, estrogen receptor positive ([place]) 04/27/2016  . Family history of breast cancer   . Family history of ovarian cancer   . Family history of prostate cancer   . Family history of pancreatic cancer     12:27 PM,09/18/18 Sherol Dade PT, DPT [place] [facility] [place]  [facility] [address] [place], [place], [address] Phone: [phone]   Fax:  [phone]  Name: Kriya Westra MRN: [id number] Date of Birth: 1975-11-29

## 2018-09-18 NOTE — Patient Instructions (Signed)
Access Code: [email or web address]  URL: [email or web address]  Date: 09/18/2018  Prepared by: Sherol Dade   Exercises  Quadruped Piriformis Stretch - 2 reps - 30 hold - 1x daily - 7x weekly  Single Leg Bridge with Leg Supported - 5 reps - 3 sets - 1x daily - 7x weekly  Standing Hip Extension with Resistance - 15 reps - 2 sets - 1x daily - 7x weekly  Quadruped Rocking Backward - 15 reps - 1x daily - 7x weekly  Clamshell with Resistance - 10 reps - 2-3 sets - 1x daily - 7x weekly  Upright Side Lunge - 10 reps - 2 sets - 1x daily - 7x weekly  Side Stepping with Resistance at Feet - 15-20 reps - 2 sets - 1x daily - 7x weekly    [facility] [address] [place], [place] [address] Phone # [phone] Fax [phone]

## 2018-09-25 ENCOUNTER — Ambulatory Visit: Payer: Medicaid Other | Admitting: Physical Therapy

## 2018-09-27 ENCOUNTER — Telehealth: Payer: Self-pay | Admitting: Oncology

## 2018-09-27 NOTE — Telephone Encounter (Signed)
GM PAL moved 7/21 appointments to 8/6. Left message. Schedule mailed.

## 2018-10-02 ENCOUNTER — Ambulatory Visit: Payer: BC Managed Care – PPO | Attending: Orthopedic Surgery | Admitting: Physical Therapy

## 2018-10-02 ENCOUNTER — Encounter: Payer: Self-pay | Admitting: Physical Therapy

## 2018-10-02 ENCOUNTER — Other Ambulatory Visit: Payer: Self-pay

## 2018-10-02 DIAGNOSIS — G8929 Other chronic pain: Secondary | ICD-10-CM | POA: Diagnosis present

## 2018-10-02 DIAGNOSIS — M6281 Muscle weakness (generalized): Secondary | ICD-10-CM

## 2018-10-02 DIAGNOSIS — M25561 Pain in right knee: Secondary | ICD-10-CM | POA: Diagnosis present

## 2018-10-02 DIAGNOSIS — M25562 Pain in left knee: Secondary | ICD-10-CM | POA: Insufficient documentation

## 2018-10-02 DIAGNOSIS — M62838 Other muscle spasm: Secondary | ICD-10-CM

## 2018-10-02 NOTE — Patient Instructions (Signed)
Access Code: [email or web address]  URL: [email or web address]  Date: 10/02/2018  Prepared by: Sherol Dade   Exercises  Single Leg Bridge with Leg Supported - 5 reps - 3 sets - 1x daily - 7x weekly  Standing Hip Extension with Resistance - 15 reps - 2 sets - 1x daily - 7x weekly  Quadruped Rocking Backward - 15 reps - 1x daily - 7x weekly  Clamshell with Resistance - 10 reps - 2-3 sets - 1x daily - 7x weekly  Upright Side Lunge - 10 reps - 2 sets - 1x daily - 7x weekly  Side Stepping with Resistance at Feet - 15-20 reps - 2 sets - 1x daily - 7x weekly  Seated Piriformis Stretch - 3 reps - 20 hold - 1x daily - 7x weekly  Seated Piriformis Stretch - 3 reps - 30 hold - 1x daily - 7x weekly  Half Kneeling Hip Flexor Stretch with Chair - 3 reps - 20 hold - 1x daily - 7x weekly    [facility] [address], [place], [place] [address] Phone # [phone] Fax [phone]

## 2018-10-02 NOTE — Therapy (Signed)
[facility] [address] [place], [place], [address] Phone: [phone]   Fax:  [phone]  Physical Therapy Treatment/Re-evaluation  Patient Details  Name: Candice [name] MRN: [id number] Date of Birth: 03-19-1976 Referring Provider (PT): Dorna Leitz, MD    Encounter Date: 10/02/2018  PT End of Session - 10/02/18 1148    Visit Number  10    Date for PT Re-Evaluation  10/05/18    Authorization Type  Medicaid     Authorization Time Period  Pending medicaid approval; 10/06/18 to 11/16/18    Authorization - Visit Number  7    Authorization - Number of Visits  10    Activity Tolerance  Patient tolerated treatment well;No increased pain    Behavior During Therapy  WFL for tasks assessed/performed       Past Medical History:  Diagnosis Date  . Breast cancer ([place]) 05/2016   right  . Chronic constipation   . Family history of cancer   . Fibroid, uterine   . IBS (irritable bowel syndrome)    IBS - C (constipation)  . Personal history of chemotherapy   . Personal history of radiation therapy     Past Surgical History:  Procedure Laterality Date  . BARTHOLIN CYST MARSUPIALIZATION  09/04/2002  . BARTHOLIN GLAND CYST EXCISION Left 07/13/2005  . BREAST LUMPECTOMY WITH RADIOACTIVE SEED AND SENTINEL LYMPH NODE BIOPSY Right 06/07/2016   Procedure: BREAST LUMPECTOMY WITH RADIOACTIVE SEED AND SENTINEL LYMPH NODE BIOPSY;  Surgeon: Alphonsa Overall, MD;  Location: [place];  Service: General;  Laterality: Right;  . HYSTEROSCOPY  2016  . MYOMECTOMY  07/20/2004   Laparotomy  . OVARIAN CYST REMOVAL Right 07/20/2004  . PORTACATH PLACEMENT N/A 07/05/2016   REMOVED Fall 2018 ; Procedure: INSERTION PORT-A-CATH WITH [place];  Surgeon: Alphonsa Overall, MD;  Location: [place];  Service: General;  Laterality: N/A;  . ROBOTIC ASSISTED TOTAL HYSTERECTOMY WITH BILATERAL SALPINGO OOPHERECTOMY Bilateral 01/04/2018    Procedure: XI ROBOTIC ASSISTED TOTAL HYSTERECTOMY WITH BILATERAL SALPINGO OOPHORECTOMY;  Surgeon: Isabel Caprice, MD;  Location: [facility];  Service: Gynecology;  Laterality: Bilateral;    There were no vitals filed for this visit.  Subjective Assessment - 10/02/18 1107    Subjective  Pt states that her knee is much better than when she started. Her exercises seem to really help with her knee pain. She is not 100% but does feel atleast 50% or greater improved. She is doing her walks but sometimes has issues with her walking and stair negotiation.    Pertinent History  breast cancer; hysterectomy    Patient Stated Goals  walk without pain    Currently in Pain?  No/denies    Pain Onset  More than a month ago         [facility] PT Assessment - 10/02/18 0001      Assessment   Medical Diagnosis  Rt knee chondromalacia     Referring Provider (PT)  Dorna Leitz, MD     Onset Date/Surgical Date  --   ~5 years ago   Next MD Visit  none for this     Prior Therapy  none for knees       Precautions   Precautions  None      Restrictions   Weight Bearing Restrictions  No      Balance Screen   Has the patient fallen in the past 6 months  No    Has the  patient had a decrease in activity level because of a fear of falling?   No    Is the patient reluctant to leave their home because of a fear of falling?   No      Prior Function   Level of Independence  Independent    Leisure  pt typically completing high level intervals/sprints and total body workouts at the gym; currently only doing elliptical and UE strengthening       Cognition   Overall Cognitive Status  Within Functional Limits for tasks assessed      Observation/Other Assessments   Lower Extremity Functional Scale   72/80      Sensation   Light Touch  Appears Intact      Functional Tests   Functional tests  Squat;Single Leg Squat      Squat   Comments  BLE without knee valgus and without pain       Single Leg Squat    Comments  x10 reps each without pain, minimal knee valgus with increase in weight through toes       Posture/Postural Control   Posture/Postural Control  Postural limitations    Posture Comments  increased valgus of knees      PROM   Overall PROM Comments  active Rt ER: 20 deg, Lt ER: 35      Strength   Right Hip External Rotation   5/5    Right Hip Internal Rotation  5/5    Right Hip ABduction  5/5    Left Hip External Rotation  5/5    Left Hip Internal Rotation  5/5    Left Hip ABduction  5/5    Right Knee Flexion  5/5    Right Knee Extension  5/5    Left Knee Flexion  5/5    Left Knee Extension  5/5                   [facility] Adult PT Treatment/Exercise - 10/02/18 0001      Knee/Hip Exercises: Stretches   Sports administrator  Both;1 rep;30 seconds    Quad Stretch Limitations  kneeling    Other Knee/Hip Stretches  Rt piriformis stretch seated x30 sec knee towards chest and knee away from chest       Knee/Hip Exercises: Supine   Bridges Limitations  single leg for HEP demo x5 reps each       Knee/Hip Exercises: Sidelying   Clams  Rt only, green TB x10 reps              PT Education - 10/02/18 1148    Education Details  updated and reviewed HEP; progress towards goals    Person(s) Educated  Patient    Methods  Explanation;Handout;Verbal cues    Comprehension  Verbalized understanding;Returned demonstration       PT Short Term Goals - 10/02/18 1126      PT SHORT TERM GOAL #1   Title  Pt will demo consistency and independence with her initial HEP to improve exercise understanding and improve LE strength.     Time  4    Period  Weeks    Status  Achieved    Target Date  09/06/18        PT Long Term Goals - 10/02/18 1126      PT LONG TERM GOAL #1   Title  Pt will have increase in BLE strength to 5/5 MMT which will allow for safe return to gym program  on her own after d/c.     Baseline  5/5 MMT    Time  10    Period  Weeks    Status  Achieved       PT LONG TERM GOAL #2   Title  Pt will be able to complete single leg mini squat without UE support and with minimal knee valgus deviation x10 reps which will reflect improvements in LE strength and neuromusular control     Baseline  able to complete without pain, knee valgus noted and weight shifted towards toes    Time  10    Period  Weeks    Status  Partially Met      PT LONG TERM GOAL #3   Title  Pt will have atleast 9pt improvement on her LEFS to reflect significant decrease in her LE disability and pain with daily activity.     Baseline  initial eval was 63/80 and update is 72/80    Time  10    Period  Weeks    Status  Achieved      PT LONG TERM GOAL #4   Title  Pt will report atleast 50% improvement in her pain with daily activity and have good understanding of safe exercise progressions and activity for the gym.     Baseline  atleast 40%    Time  10    Period  Weeks    Status  Partially Met      PT LONG TERM GOAL #5   Title  Pt will be able to walk for atleast 1 hour in her neighborhood without knee pain at a steady pace, in order to maintain a healthy lifestyle.    Time  6    Period  Weeks    Status  New    Target Date  11/16/18            Plan - 10/02/18 1150    Clinical Impression Statement  Pt has made steady progress since beginning PT, having met all short term goals and 50% of her long term goals. She has been consistently completing her HEP, and therapist closely monitors progressions due to pt tendency to complete more than instructed. Pt has improved LE strength to 5/5 MMT, her LEFS has improved significantly by 9 points to 72/80 and she feels atleast 40-50% improved. Pt's mechanics with activity such as squatting have greatly improved as well, noting no pain with this and able to do 10+ reps without increase in knee pain. Pt does continue to have limitations hip flexibility and single leg control of knee valgus, noted during single leg squatting. Pt is limited  functionally with her daily walks, noting increase in knee pain when walking more than 45 minutes at a healthy pace. She is also unable to dance without pain, which is a recreational activity she typically participates in with her peers. Pt was unable to attend all of her originally approved visits due to scheduling conflicts from [id number] but would continue to benefit from skilled PT to promote further improvements in hip mobility and neuromuscular control. She would also benefit from guided transition to a regular home exercise routine and education on safe activity progression to allow her to return to exercise and activity with her peers.    Rehab Potential  Good    PT Frequency  1x / week    PT Duration  6 weeks    PT Treatment/Interventions  ADLs/Self Care Home Management;Cryotherapy;Functional mobility training;Therapeutic activities;Therapeutic exercise;Balance training;Patient/family  education;Neuromuscular re-education;Manual techniques;Passive range of motion;Taping;Dry needling    PT Next Visit Plan  f/u on walking 40mn at a time and increase if able; 3 way hip without UE; step down control; hip rotation PROM/AROM, TB to quadruped hip extension    PT Home Exercise Plan  Access Code: [email or web address]    Consulted and Agree with Plan of Care  Patient       Patient will benefit from skilled therapeutic intervention in order to improve the following deficits and impairments:  Decreased activity tolerance, Decreased strength, Decreased safety awareness, Impaired flexibility, Pain, Improper body mechanics, Decreased range of motion  Visit Diagnosis: 1. Chronic pain of right knee   2. Chronic pain of left knee   3. Muscle weakness (generalized)   4. Other muscle spasm        Problem List Patient Active Problem List   Diagnosis Date Noted  . Fibroid uterus 01/04/2018  . Fibroid, uterine 08/21/2017  . Port catheter in place 09/06/2016  . Genetic testing 05/09/2016  . Malignant neoplasm of  upper-inner quadrant of right breast in female, estrogen receptor positive ([place] 04/27/2016  . Family history of breast cancer   . Family history of ovarian cancer   . Family history of prostate cancer   . Family history of pancreatic cancer      12:53 PM,10/02/18 SSherol DadePT, DPT [facility] [address] [place] [address]Phone: [phone]  Fax:  [phone] Name: TDerek HuneycuttMRN: [id number] of Birth: 3Oct 16, 1977

## 2018-10-09 ENCOUNTER — Other Ambulatory Visit: Payer: Self-pay

## 2018-10-09 ENCOUNTER — Ambulatory Visit
Admission: RE | Admit: 2018-10-09 | Discharge: 2018-10-09 | Disposition: A | Discharge status: Home / Self Care | Payer: Medicaid Other | Source: Ambulatory Visit | Attending: Oncology | Admitting: Oncology

## 2018-10-10 ENCOUNTER — Ambulatory Visit: Payer: BC Managed Care – PPO | Admitting: Physical Therapy

## 2018-10-12 ENCOUNTER — Ambulatory Visit: Payer: BC Managed Care – PPO | Admitting: Physical Therapy

## 2018-10-12 ENCOUNTER — Other Ambulatory Visit: Payer: Self-pay

## 2018-10-12 ENCOUNTER — Encounter: Payer: Self-pay | Admitting: Physical Therapy

## 2018-10-12 DIAGNOSIS — G8929 Other chronic pain: Secondary | ICD-10-CM

## 2018-10-12 DIAGNOSIS — M25561 Pain in right knee: Secondary | ICD-10-CM | POA: Diagnosis not present

## 2018-10-12 DIAGNOSIS — M6281 Muscle weakness (generalized): Secondary | ICD-10-CM

## 2018-10-12 DIAGNOSIS — M25562 Pain in left knee: Secondary | ICD-10-CM

## 2018-10-12 NOTE — Therapy (Signed)
[facility] [address], [place], [place], [address] Phone: [phone]   Fax:  [phone]  Physical Therapy Treatment  Patient Details  Name: Candice [name] MRN: [id number] Date of Birth: 05-09-1975 Referring Provider (PT): Dorna Leitz, MD    Encounter Date: 10/12/2018  PT End of Session - 10/12/18 1616    Visit Number  11    Date for PT Re-Evaluation  10/05/18    Authorization Type  Medicaid     Authorization Time Period  Pending medicaid approval; 10/06/18 to 11/16/18    Authorization - Visit Number  8    Authorization - Number of Visits  15    PT Start Time  1007    PT Stop Time  1219    PT Time Calculation (min)  42 min    Activity Tolerance  Patient tolerated treatment well;No increased pain    Behavior During Therapy  WFL for tasks assessed/performed       Past Medical History:  Diagnosis Date  . Breast cancer ([place]) 05/2016   right  . Chronic constipation   . Family history of cancer   . Fibroid, uterine   . IBS (irritable bowel syndrome)    IBS - C (constipation)  . Personal history of chemotherapy   . Personal history of radiation therapy     Past Surgical History:  Procedure Laterality Date  . BARTHOLIN CYST MARSUPIALIZATION  09/04/2002  . BARTHOLIN GLAND CYST EXCISION Left 07/13/2005  . BREAST LUMPECTOMY WITH RADIOACTIVE SEED AND SENTINEL LYMPH NODE BIOPSY Right 06/07/2016   Procedure: BREAST LUMPECTOMY WITH RADIOACTIVE SEED AND SENTINEL LYMPH NODE BIOPSY;  Surgeon: Alphonsa Overall, MD;  Location: [place];  Service: General;  Laterality: Right;  . HYSTEROSCOPY  2016  . MYOMECTOMY  07/20/2004   Laparotomy  . OVARIAN CYST REMOVAL Right 07/20/2004  . PORTACATH PLACEMENT N/A 07/05/2016   REMOVED Fall 2018 ; Procedure: INSERTION PORT-A-CATH WITH [place];  Surgeon: Alphonsa Overall, MD;  Location: [place];  Service: General;  Laterality: N/A;  . ROBOTIC ASSISTED  TOTAL HYSTERECTOMY WITH BILATERAL SALPINGO OOPHERECTOMY Bilateral 01/04/2018   Procedure: XI ROBOTIC ASSISTED TOTAL HYSTERECTOMY WITH BILATERAL SALPINGO OOPHORECTOMY;  Surgeon: Isabel Caprice, MD;  [facility];  Service: Gynecology;  Laterality: Bilateral;    There were no vitals filed for this visit.  Subjective Assessment - 10/12/18 1536    Subjective  Pt states that she tried the walking adjustments and was able to get up to 40 minutes and then moved up to 50 minutes. She is able to do this without knee pain and will stay at this moving forward.    Pertinent History  breast cancer; hysterectomy    Patient Stated Goals  walk without pain    Currently in Pain?  No/denies    Pain Onset  More than a month ago                       [facility] Adult PT Treatment/Exercise - 10/12/18 0001      Exercises   Other Exercises   quadruped LE lift x5 reps with increased trunk lean, x10 reps with mirror feedback to decrease trunk lean      Knee/Hip Exercises: Stretches   Other Knee/Hip Stretches  Rt and Lt hip ER/IR active assisted stretch with LE on chair x10 reps       Knee/Hip Exercises: Aerobic   Elliptical  x4 min forward, resistance 2,  incline 5       Knee/Hip Exercises: Standing   Other Standing Knee Exercises  front goblet squat with #25 plate (shoes removed) 2x15 reps     Other Standing Knee Exercises  3 way hip slider x10 reps with ski pole support       Knee/Hip Exercises: Seated   Other Seated Knee/Hip Exercises  seated hip ER through full range of rotation yellow TB x10 reps with 10 sec hold last rep              PT Education - 10/12/18 1615    Education Details  technique with therex    Person(s) Educated  Patient    Methods  Explanation    Comprehension  Verbalized understanding       PT Short Term Goals - 10/02/18 1126      PT SHORT TERM GOAL #1   Title  Pt will demo consistency and independence with her initial HEP to improve exercise  understanding and improve LE strength.     Time  4    Period  Weeks    Status  Achieved    Target Date  09/06/18        PT Long Term Goals - 10/02/18 1126      PT LONG TERM GOAL #1   Title  Pt will have increase in BLE strength to 5/5 MMT which will allow for safe return to gym program on her own after d/c.     Baseline  5/5 MMT    Time  10    Period  Weeks    Status  Achieved      PT LONG TERM GOAL #2   Title  Pt will be able to complete single leg mini squat without UE support and with minimal knee valgus deviation x10 reps which will reflect improvements in LE strength and neuromusular control     Baseline  able to complete without pain, knee valgus noted and weight shifted towards toes    Time  10    Period  Weeks    Status  Partially Met      PT LONG TERM GOAL #3   Title  Pt will have atleast 9pt improvement on her LEFS to reflect significant decrease in her LE disability and pain with daily activity.     Baseline  initial eval was 63/80 and update is 72/80    Time  10    Period  Weeks    Status  Achieved      PT LONG TERM GOAL #4   Title  Pt will report atleast 50% improvement in her pain with daily activity and have good understanding of safe exercise progressions and activity for the gym.     Baseline  atleast 40%    Time  10    Period  Weeks    Status  Partially Met      PT LONG TERM GOAL #5   Title  Pt will be able to walk for atleast 1 hour in her neighborhood without knee pain at a steady pace, in order to maintain a healthy lifestyle.    Time  6    Period  Weeks    Status  New    Target Date  11/16/18            Plan - 10/12/18 1616    Clinical Impression Statement  Pt was able to make changes to her walking over the past week. She is now walking  50 minutes without knee pain. Session focused on increasing hip flexibility and strength through her available range of hip rotation. Pt demonstrated improvements in trunk stability during quadruped LE  extension exercise, however she did require heavy visual/tactile cuing with this. No HEP updates were made this visit, as she is currently appropriately challenged with her program. Will continue with current POC.    Rehab Potential  Good    PT Frequency  1x / week    PT Duration  6 weeks    PT Treatment/Interventions  ADLs/Self Care Home Management;Cryotherapy;Functional mobility training;Therapeutic activities;Therapeutic exercise;Balance training;Patient/family education;Neuromuscular re-education;Manual techniques;Passive range of motion;Taping;Dry needling    PT Next Visit Plan  progress hip rotation ROM and strength; single leg leg press; step down control; TB to quadruped hip extension if able    PT Home Exercise Plan  Access Code: [email or web address]     Consulted and Agree with Plan of Care  Patient       Patient will benefit from skilled therapeutic intervention in order to improve the following deficits and impairments:  Decreased activity tolerance, Decreased strength, Decreased safety awareness, Impaired flexibility, Pain, Improper body mechanics, Decreased range of motion  Visit Diagnosis: 1. Chronic pain of right knee   2. Chronic pain of left knee   3. Muscle weakness (generalized)        Problem List Patient Active Problem List   Diagnosis Date Noted  . Fibroid uterus 01/04/2018  . Fibroid, uterine 08/21/2017  . Port catheter in place 09/06/2016  . Genetic testing 05/09/2016  . Malignant neoplasm of upper-inner quadrant of right breast in female, estrogen receptor positive ([place]) 04/27/2016  . Family history of breast cancer   . Family history of ovarian cancer   . Family history of prostate cancer   . Family history of pancreatic cancer     4:21 PM,10/12/18 Sherol Dade PT, DPT [place] [facility] [address] [place], [place], [address] Phone:  [phone]   Fax:  [phone]  Name: Vonne Mcdanel MRN: [id number] Date of Birth: 06-06-1975

## 2018-10-16 ENCOUNTER — Other Ambulatory Visit: Payer: Medicaid Other

## 2018-10-16 ENCOUNTER — Ambulatory Visit: Payer: Medicaid Other | Admitting: Oncology

## 2018-10-17 ENCOUNTER — Encounter: Payer: Self-pay | Admitting: Physical Therapy

## 2018-10-17 ENCOUNTER — Ambulatory Visit: Payer: BC Managed Care – PPO | Admitting: Physical Therapy

## 2018-10-17 ENCOUNTER — Other Ambulatory Visit: Payer: Self-pay

## 2018-10-17 DIAGNOSIS — M6281 Muscle weakness (generalized): Secondary | ICD-10-CM

## 2018-10-17 DIAGNOSIS — M25561 Pain in right knee: Secondary | ICD-10-CM | POA: Diagnosis not present

## 2018-10-17 DIAGNOSIS — M62838 Other muscle spasm: Secondary | ICD-10-CM

## 2018-10-17 DIAGNOSIS — G8929 Other chronic pain: Secondary | ICD-10-CM

## 2018-10-17 DIAGNOSIS — M25562 Pain in left knee: Secondary | ICD-10-CM

## 2018-10-17 NOTE — Therapy (Signed)
[facility] [address] [place], [place], [address] Phone: [phone]   Fax:  [phone]  Physical Therapy Treatment  Patient Details  Name: Candice [name] MRN: [id number] Date of Birth: 1975/10/30 Referring Provider (PT): Dorna Leitz, MD    Encounter Date: 10/17/2018  PT End of Session - 10/17/18 6389    Visit Number  12    Date for PT Re-Evaluation  10/05/18    Authorization Type  Medicaid     Authorization Time Period  Pending medicaid approval; 10/06/18 to 11/16/18    Authorization - Visit Number  9    Authorization - Number of Visits  15    PT Start Time  1700    PT Stop Time  1742    PT Time Calculation (min)  42 min    Activity Tolerance  Patient tolerated treatment well;No increased pain    Behavior During Therapy  WFL for tasks assessed/performed       Past Medical History:  Diagnosis Date  . Breast cancer ([place]) 05/2016   right  . Chronic constipation   . Family history of cancer   . Fibroid, uterine   . IBS (irritable bowel syndrome)    IBS - C (constipation)  . Personal history of chemotherapy   . Personal history of radiation therapy     Past Surgical History:  Procedure Laterality Date  . BARTHOLIN CYST MARSUPIALIZATION  09/04/2002  . BARTHOLIN GLAND CYST EXCISION Left 07/13/2005  . BREAST LUMPECTOMY WITH RADIOACTIVE SEED AND SENTINEL LYMPH NODE BIOPSY Right 06/07/2016   Procedure: BREAST LUMPECTOMY WITH RADIOACTIVE SEED AND SENTINEL LYMPH NODE BIOPSY;  Surgeon: Alphonsa Overall, MD;  Location: [facility];  Service: General;  Laterality: Right;  . HYSTEROSCOPY  2016  . MYOMECTOMY  07/20/2004   Laparotomy  . OVARIAN CYST REMOVAL Right 07/20/2004  . PORTACATH PLACEMENT N/A 07/05/2016   REMOVED Fall 2018 ; Procedure: INSERTION PORT-A-CATH WITH [place];  Surgeon: Alphonsa Overall, MD;  Location: [facility];  Service: General;  Laterality: N/A;  . ROBOTIC ASSISTED  TOTAL HYSTERECTOMY WITH BILATERAL SALPINGO OOPHERECTOMY Bilateral 01/04/2018   Procedure: XI ROBOTIC ASSISTED TOTAL HYSTERECTOMY WITH BILATERAL SALPINGO OOPHORECTOMY;  Surgeon: Isabel Caprice, MD;  Location: [facility];  Service: Gynecology;  Laterality: Bilateral;    There were no vitals filed for this visit.  Subjective Assessment - 10/17/18 1703    Subjective  Pt states that he knees started bothering her yesterday after her walk so she took today off. She felt good after her last session.    Pertinent History  breast cancer; hysterectomy    Patient Stated Goals  walk without pain    Currently in Pain?  No/denies    Pain Onset  More than a month ago                       [facility] Adult PT Treatment/Exercise - 10/17/18 0001      Knee/Hip Exercises: Standing   Step Down  Both;2 sets;15 reps;Hand Hold: 1;Step Height: 4"    Step Down Limitations  good knee valgus control    Other Standing Knee Exercises  single leg sit with B ski poles 2x10 reps, 5 reps 3 count without UE support    Other Standing Knee Exercises  sidestepping x70f with blue TB around feet      Knee/Hip Exercises: Seated   Other Seated Knee/Hip Exercises  hip ER/IR with yellow TB x12 reps  each      Knee/Hip Exercises: Sidelying   Hip ADduction  Right;Left;Strengthening;1 set;15 reps    Hip ADduction Limitations  LE on 10" box, 3 sec hold last 5 reps     Clams  blue TB x15 reps each LE             PT Education - 10/17/18 1743    Education Details  updated HEP    Person(s) Educated  Patient    Methods  Explanation;Handout    Comprehension  Verbalized understanding       PT Short Term Goals - 10/02/18 1126      PT SHORT TERM GOAL #1   Title  Pt will demo consistency and independence with her initial HEP to improve exercise understanding and improve LE strength.     Time  4    Period  Weeks    Status  Achieved    Target Date  09/06/18        PT Long Term Goals - 10/02/18 1126      PT  LONG TERM GOAL #1   Title  Pt will have increase in BLE strength to 5/5 MMT which will allow for safe return to gym program on her own after d/c.     Baseline  5/5 MMT    Time  10    Period  Weeks    Status  Achieved      PT LONG TERM GOAL #2   Title  Pt will be able to complete single leg mini squat without UE support and with minimal knee valgus deviation x10 reps which will reflect improvements in LE strength and neuromusular control     Baseline  able to complete without pain, knee valgus noted and weight shifted towards toes    Time  10    Period  Weeks    Status  Partially Met      PT LONG TERM GOAL #3   Title  Pt will have atleast 9pt improvement on her LEFS to reflect significant decrease in her LE disability and pain with daily activity.     Baseline  initial eval was 63/80 and update is 72/80    Time  10    Period  Weeks    Status  Achieved      PT LONG TERM GOAL #4   Title  Pt will report atleast 50% improvement in her pain with daily activity and have good understanding of safe exercise progressions and activity for the gym.     Baseline  atleast 40%    Time  10    Period  Weeks    Status  Partially Met      PT LONG TERM GOAL #5   Title  Pt will be able to walk for atleast 1 hour in her neighborhood without knee pain at a steady pace, in order to maintain a healthy lifestyle.    Time  6    Period  Weeks    Status  New    Target Date  11/16/18            Plan - 10/17/18 1743    Clinical Impression Statement  Pt arrived without complaints of knee pain, but she did note that she held off on her morning walk due to increased knee pain yesterday after her walk. Pt demonstrated improved understanding of activity modification with this which is a great improvement from previous weeks. Pt was able to progress resistance with several exercises  in her home program. She demonstrates improved knee valgus control with sit to stand and step down this session. Ended with good  understanding of HEP updates and no reported increase in knee pain.    Rehab Potential  Good    PT Frequency  1x / week    PT Duration  6 weeks    PT Treatment/Interventions  ADLs/Self Care Home Management;Cryotherapy;Functional mobility training;Therapeutic activities;Therapeutic exercise;Balance training;Patient/family education;Neuromuscular re-education;Manual techniques;Passive range of motion;Taping;Dry needling    PT Next Visit Plan  progress hip rotation ROM and strength; increase to red TB seated IR/ER single leg leg press; step down control 6"; TB to quadruped hip extension if able    PT Home Exercise Plan  Access Code: [email or web address]     Consulted and Agree with Plan of Care  Patient       Patient will benefit from skilled therapeutic intervention in order to improve the following deficits and impairments:  Decreased activity tolerance, Decreased strength, Decreased safety awareness, Impaired flexibility, Pain, Improper body mechanics, Decreased range of motion  Visit Diagnosis: 1. Chronic pain of right knee   2. Chronic pain of left knee   3. Muscle weakness (generalized)   4. Other muscle spasm        Problem List Patient Active Problem List   Diagnosis Date Noted  . Fibroid uterus 01/04/2018  . Fibroid, uterine 08/21/2017  . Port catheter in place 09/06/2016  . Genetic testing 05/09/2016  . Malignant neoplasm of upper-inner quadrant of right breast in female, estrogen receptor positive ([place]) 04/27/2016  . Family history of breast cancer   . Family history of ovarian cancer   . Family history of prostate cancer   . Family history of pancreatic cancer     5:48 PM,10/17/18 Sherol Dade PT, [place] [facility] [place]  [facility] [address] [place], [place], [address] Phone: [phone]   Fax:  [phone]  Name: Candice [name] MRN: [id number] Date  of Birth: 06-28-1975

## 2018-10-17 NOTE — Patient Instructions (Signed)
Access Code: [email or web address]  URL: [email or web address]  Date: 10/17/2018  Prepared by: Sherol Dade   Exercises  Single Leg Bridge with Leg Supported - 5 reps - 3 sets - 1x daily - 7x weekly  Standing Hip Extension with Resistance - 15 reps - 2 sets - 1x daily - 7x weekly  Quadruped Rocking Backward - 15 reps - 1x daily - 7x weekly  Clamshell with Resistance - 15 reps - 2-3 sets - 1x daily - 7x weekly  Seated Piriformis Stretch - 3 reps - 20 hold - 1x daily - 7x weekly  Seated Piriformis Stretch - 3 reps - 30 hold - 1x daily - 7x weekly  Half Kneeling Hip Flexor Stretch with Chair - 3 reps - 20 hold - 1x daily - 7x weekly  Single Leg Sit to Stand with Arms Extended - 10 reps - 3 sets - 1x daily - 7x weekly  Side Stepping with Resistance at Feet - 15 reps - 2 sets - 1x daily - 7x weekly    [facility] [address] [place], [place] [address] Phone # [phone] Fax [phone]

## 2018-10-24 ENCOUNTER — Encounter: Payer: Self-pay | Admitting: Physical Therapy

## 2018-10-24 ENCOUNTER — Ambulatory Visit: Payer: BC Managed Care – PPO | Admitting: Physical Therapy

## 2018-10-24 ENCOUNTER — Other Ambulatory Visit: Payer: Self-pay

## 2018-10-24 DIAGNOSIS — M62838 Other muscle spasm: Secondary | ICD-10-CM

## 2018-10-24 DIAGNOSIS — G8929 Other chronic pain: Secondary | ICD-10-CM

## 2018-10-24 DIAGNOSIS — M25561 Pain in right knee: Secondary | ICD-10-CM | POA: Diagnosis not present

## 2018-10-24 DIAGNOSIS — M25562 Pain in left knee: Secondary | ICD-10-CM

## 2018-10-24 DIAGNOSIS — M6281 Muscle weakness (generalized): Secondary | ICD-10-CM

## 2018-10-24 NOTE — Therapy (Signed)
[facility] [address] [place], [place], [address] Phone: [phone]   Fax:  [phone]  Physical Therapy Treatment  Patient Details  Name: Marguerita Stapp MRN: [id number] Date of Birth: [birth year] Referring Provider (PT): Dorna Leitz, MD    Encounter Date: 10/24/2018  PT End of Session - 10/24/18 1939    Visit Number  13    Date for PT Re-Evaluation  10/05/18    Authorization Type  Medicaid     Authorization Time Period  Pending medicaid approval; 10/06/18 to 11/16/18    Authorization - Visit Number  10    Authorization - Number of Visits  15    PT Start Time  1856    PT Stop Time  1946    PT Time Calculation (min)  50 min    Activity Tolerance  Patient tolerated treatment well;No increased pain    Behavior During Therapy  WFL for tasks assessed/performed       Past Medical History:  Diagnosis Date  . Breast cancer ([place]) 05/2016   right  . Chronic constipation   . Family history of cancer   . Fibroid, uterine   . IBS (irritable bowel syndrome)    IBS - C (constipation)  . Personal history of chemotherapy   . Personal history of radiation therapy     Past Surgical History:  Procedure Laterality Date  . BARTHOLIN CYST MARSUPIALIZATION  09/04/2002  . BARTHOLIN GLAND CYST EXCISION Left 07/13/2005  . BREAST LUMPECTOMY WITH RADIOACTIVE SEED AND SENTINEL LYMPH NODE BIOPSY Right 06/07/2016   Procedure: BREAST LUMPECTOMY WITH RADIOACTIVE SEED AND SENTINEL LYMPH NODE BIOPSY;  Surgeon: Alphonsa Overall, MD;  Location: [facility];  Service: General;  Laterality: Right;  . HYSTEROSCOPY  2016  . MYOMECTOMY  07/20/2004   Laparotomy  . OVARIAN CYST REMOVAL Right 07/20/2004  . PORTACATH PLACEMENT N/A 07/05/2016   REMOVED Fall 2018 ; Procedure: INSERTION PORT-A-CATH WITH [place];  Surgeon: Alphonsa Overall, MD;  Location: [facility];  Service: General;  Laterality: N/A;  . ROBOTIC ASSISTED  TOTAL HYSTERECTOMY WITH BILATERAL SALPINGO OOPHERECTOMY Bilateral 01/04/2018   Procedure: XI ROBOTIC ASSISTED TOTAL HYSTERECTOMY WITH BILATERAL SALPINGO OOPHORECTOMY;  Surgeon: Isabel Caprice, MD;  Location[facility];  Service: Gynecology;  Laterality: Bilateral;    There were no vitals filed for this visit.  Subjective Assessment - 10/24/18 1857    Subjective  Pt states that she is doing her walking and exercises consistently throughout the day without any issues. She has no pain currently.    Pertinent History  breast cancer; hysterectomy    Patient Stated Goals  walk without pain    Currently in Pain?  No/denies    Pain Onset  More than a month ago                       [facility] Adult PT Treatment/Exercise - 10/24/18 0001      Knee/Hip Exercises: Stretches   Other Knee/Hip Stretches  kneeling quadriceps stretch x30 sec each     Other Knee/Hip Stretches  self foam rolling quadriceps, ITB and gluteals      Knee/Hip Exercises: Aerobic   Elliptical  x3 min forward, x3 min backward PT present to discuss HEP      Knee/Hip Exercises: Machines for Strengthening   Total Gym Leg Press  Seat 6 #160 2x25 reps; single leg #110 2x20      Knee/Hip Exercises: Standing  Step Down  Both;2 sets;15 reps;Hand Hold: 1    Step Down Limitations  slow eccentric       Knee/Hip Exercises: Seated   Other Seated Knee/Hip Exercises  hip ER/IR with red TB x15 reps each             PT Education - 10/24/18 1938    Education Details  technique with therex    Person(s) Educated  Patient    Methods  Explanation;Handout;Verbal cues    Comprehension  Verbalized understanding;Returned demonstration       PT Short Term Goals - 10/02/18 1126      PT SHORT TERM GOAL #1   Title  Pt will demo consistency and independence with her initial HEP to improve exercise understanding and improve LE strength.     Time  4    Period  Weeks    Status  Achieved    Target Date  09/06/18        PT  Long Term Goals - 10/02/18 1126      PT LONG TERM GOAL #1   Title  Pt will have increase in BLE strength to 5/5 MMT which will allow for safe return to gym program on her own after d/c.     Baseline  5/5 MMT    Time  10    Period  Weeks    Status  Achieved      PT LONG TERM GOAL #2   Title  Pt will be able to complete single leg mini squat without UE support and with minimal knee valgus deviation x10 reps which will reflect improvements in LE strength and neuromusular control     Baseline  able to complete without pain, knee valgus noted and weight shifted towards toes    Time  10    Period  Weeks    Status  Partially Met      PT LONG TERM GOAL #3   Title  Pt will have atleast 9pt improvement on her LEFS to reflect significant decrease in her LE disability and pain with daily activity.     Baseline  initial eval was 63/80 and update is 72/80    Time  10    Period  Weeks    Status  Achieved      PT LONG TERM GOAL #4   Title  Pt will report atleast 50% improvement in her pain with daily activity and have good understanding of safe exercise progressions and activity for the gym.     Baseline  atleast 40%    Time  10    Period  Weeks    Status  Partially Met      PT LONG TERM GOAL #5   Title  Pt will be able to walk for atleast 1 hour in her neighborhood without knee pain at a steady pace, in order to maintain a healthy lifestyle.    Time  6    Period  Weeks    Status  New    Target Date  11/16/18            Plan - 10/24/18 1939    Clinical Impression Statement  Pt is doing well with her HEP, noting no difficulties with this. This session she was able to progress all exercises without increase in knee pain. She does require therapist instruction on repetition control in order to avoid irritating the knees. Pt was able to progress to a 6 inch box for step downs and demonstrated  more hip instability with slowed eccentric portion of the movement. This did improve some with 1 UE  support and therapist verbal cuing. Ended without increase in knee pain.    Rehab Potential  Good    PT Frequency  1x / week    PT Duration  6 weeks    PT Treatment/Interventions  ADLs/Self Care Home Management;Cryotherapy;Functional mobility training;Therapeutic activities;Therapeutic exercise;Balance training;Patient/family education;Neuromuscular re-education;Manual techniques;Passive range of motion;Taping;Dry needling    PT Next Visit Plan  progress hip rotation ROM and strength; increase to red TB seated IR/ER; single leg leg press; step down control 6"; TB to quadruped hip extension if able    PT Home Exercise Plan  Access Code: [email or web address]     Consulted and Agree with Plan of Care  Patient       Patient will benefit from skilled therapeutic intervention in order to improve the following deficits and impairments:  Decreased activity tolerance, Decreased strength, Decreased safety awareness, Impaired flexibility, Pain, Improper body mechanics, Decreased range of motion  Visit Diagnosis: 1. Chronic pain of right knee   2. Chronic pain of left knee   3. Muscle weakness (generalized)   4. Other muscle spasm        Problem List Patient Active Problem List   Diagnosis Date Noted  . Fibroid uterus 01/04/2018  . Fibroid, uterine 08/21/2017  . Port catheter in place 09/06/2016  . Genetic testing 05/09/2016  . Malignant neoplasm of upper-inner quadrant of right breast in female, estrogen receptor positive ([place]) 04/27/2016  . Family history of breast cancer   . Family history of ovarian cancer   . Family history of prostate cancer   . Family history of pancreatic cancer     Sherol Dade 10/24/2018, 7:51 PM  [facility] [address] [place], [place], [address] Phone: [phone]   Fax:  [phone]  Name: Jaquasia Doscher MRN: [id number] Date of Birth: February 11, 1976

## 2018-10-30 ENCOUNTER — Encounter: Payer: Medicaid Other | Admitting: Physical Therapy

## 2018-10-31 ENCOUNTER — Other Ambulatory Visit: Payer: Self-pay

## 2018-10-31 DIAGNOSIS — Z170 Estrogen receptor positive status [ER+]: Secondary | ICD-10-CM

## 2018-10-31 DIAGNOSIS — C50211 Malignant neoplasm of upper-inner quadrant of right female breast: Secondary | ICD-10-CM

## 2018-10-31 NOTE — Progress Notes (Signed)
[place]  [phone] [phone]     ID: Candice [name] DOB: [birth year]  MR#: [id number]  TIW#:[id number]  Patient Care Team: Candice Jordan, MD as PCP - General (Family Medicine) Candice Overall, MD as Consulting Physician (General Surgery) Candice [name], Candice Dad, MD as Consulting Physician (Oncology) Candice Gibson, MD as Attending Physician (Radiation Oncology) Candice Cockayne, MD as Referring Physician (Surgical Oncology) Candice [name], Candice Massed, NP as Nurse Practitioner (Hematology and Oncology) Candice Caprice, MD as Consulting Physician (Gynecologic Oncology) OTHER MD:  CHIEF COMPLAINT: estrogen receptor positive breast cancer  CURRENT TREATMENT:  anastrozole   INTERVAL HISTORY: Candice [name] returns today for follow-up of her estrogen receptor positive breast cancer.   She continues on anastrozole, with good tolerance. She reports hot flashes, but they are "nothing to complain about."  Since her last visit, she underwent bilateral diagnostic mammography with tomography at [facility] on 04/19/2018 showing: breast density category C; no evidence of malignancy in either breast.  She also underwent bone density screening on 10/09/2018. This showed a T-score of -0.9, which is considered normal.   REVIEW OF SYSTEMS: Candice [name] reports she walks in the mornings. She states she is having a bowel movement every day. She reports she had lost weight following her surgery, but she has since gained it back. She is Surveyor, quantity at [employer]. She lives at home with her parents.A detailed review of systems was otherwise stable.    BREAST CANCER HISTORY: From the original intake note:  Candice [name] was evaluated at the [facility] in [place] in December, at which time a mass in the right breast was noted. She was referred for [facility] and screening mammography was obtained, showing a possible mass in the right breast.on  04/21/2016 she underwent right diagnostic mammography with tomography and ultrasonography at the breast Center, and this confirmedan irregular spiculat in the right breast measuring 1.8 cm, which was not palpable to the mammographer. Ultrasonography confirmed an irregular hypoechoic right breast mass at the 12:30 o'clock position 4 cm from the nipple measuring 2.2 cm. The right axilla was sonographically benign.  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  Her subsequent history is detailed below   PAST MEDICAL HISTORY: Past Medical History:  Diagnosis Date   Breast cancer ([place]) 05/2016   right   Chronic constipation    Family history of cancer    Fibroid, uterine    IBS (irritable bowel syndrome)    IBS - C (constipation)   Personal history of chemotherapy    Personal history of radiation therapy     PAST SURGICAL HISTORY: Past Surgical History:  Procedure Laterality Date   BARTHOLIN CYST MARSUPIALIZATION  09/04/2002   BARTHOLIN GLAND CYST EXCISION Left 07/13/2005   BREAST LUMPECTOMY WITH RADIOACTIVE SEED AND SENTINEL LYMPH NODE BIOPSY Right 06/07/2016   Procedure: BREAST LUMPECTOMY WITH RADIOACTIVE SEED AND SENTINEL LYMPH NODE BIOPSY;  Surgeon: Candice Overall, MD;  Location: [facility];  Service: General;  Laterality: Right;   HYSTEROSCOPY  2016   MYOMECTOMY  07/20/2004   Laparotomy   OVARIAN CYST REMOVAL Right 07/20/2004   PORTACATH PLACEMENT N/A 07/05/2016   REMOVED Fall 2018 ; Procedure: INSERTION PORT-A-CATH WITH [place];  Surgeon: Candice Overall, MD;  Location: [facility];  Service: General;  Laterality: N/A;   ROBOTIC ASSISTED TOTAL HYSTERECTOMY WITH BILATERAL SALPINGO OOPHERECTOMY  Bilateral 01/04/2018   Procedure: XI ROBOTIC ASSISTED TOTAL HYSTERECTOMY WITH BILATERAL SALPINGO OOPHORECTOMY;  Surgeon: Candice Caprice, MD;  [facility];  Service: Gynecology;  Laterality: Bilateral;    FAMILY HISTORY Family History  Problem Relation Age of Onset   Breast cancer Mother 56   Pancreatic cancer Paternal Grandmother    Prostate cancer Paternal Grandfather    Ovarian cancer Other        MGMs sister   Breast cancer Other        mother's maternal first cousin  The patient's father, Candice [name], lives in [place] and works as a Geneticist, molecular. The patient's mother is a Quarry manager. The patient has no brothers and 1 sister, who works in [place] as a Network engineer. The patient's mother had breast cancer, stage 0, diagnosed at age 55. There are 2 maternal relatives (mother's sister and mother's niece) with breast cancer, both postmenopausal.   GYNECOLOGIC HISTORY:  Menarche age 6, the patient is GX P0. No LMP recorded. (Menstrual status: Chemotherapy).   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 She generally lives in [place] by herself although she is currently staying at her parents' in [place].  Both her parents work, her mother in a nursing home and her father for the water department in [place].    ADVANCED DIRECTIVES: Not in place. The patient tells me she intends to name her sister as her healthcare power of attorney.    HEALTH MAINTENANCE: Social History   Tobacco Use   Smoking status: Never Smoker   Smokeless tobacco: Never Used  Substance Use Topics   Alcohol use: Yes    Alcohol/week: 1.0 standard drinks    Types: 1 Glasses of wine per week    Comment: once per month   Drug use: No     Colonoscopy: July 2016, in [place]  PAP:  Bone density: Never   Allergies  Allergen Reactions    Paclitaxel Itching   Amoxicillin Rash    Has patient had a PCN reaction causing immediate rash, facial/tongue/throat swelling, SOB or lightheadedness with hypotension: Yes Has patient had a PCN reaction causing severe rash involving mucus membranes or skin necrosis: Yes Has patient had a PCN reaction that required hospitalization: Yes Has patient had a PCN reaction occurring within the last 10 years: No If all of the above answers are "NO", then may proceed with Cephalosporin use.     Current Outpatient Medications  Medication Sig Dispense Refill   anastrozole (ARIMIDEX) 1 MG tablet TAKE 1 TABLET(1 MG) BY MOUTH DAILY 30 tablet 2   gabapentin (NEURONTIN) 300 MG capsule Take 1 capsule (300 mg total) by mouth at bedtime. 90 capsule 4   venlafaxine XR (EFFEXOR-XR) 37.5 MG 24 hr capsule Take 1 capsule (37.5 mg total) by mouth daily with breakfast. 60 capsule 6   No current facility-administered medications for this visit.     OBJECTIVE: young African-American woman who appears well  Vitals:   11/01/18 1507  BP: 104/89  Pulse: 76  Resp: 18  Temp: 97.8 F (36.6 C)  SpO2: 100%     Body mass index is 28.73 kg/m.    ECOG FS: 1 - Symptomatic but completely ambulatory Filed Weights   11/01/18 1507  Weight: 170 lb (  77.1 kg)    Sclerae unicteric, EOMs intact Wearing a mask No cervical or supraclavicular adenopathy Lungs no rales or rhonchi Heart regular rate and rhythm Abd soft, nontender, positive bowel sounds MSK no focal spinal tenderness, no upper extremity lymphedema Neuro: nonfocal, well oriented, appropriate affect Breasts: The right breast is status post lumpectomy and radiation.  The cosmetic result is good.  There is no evidence of local recurrence.  The left breast is benign.  Both axillae are benign.   LAB RESULTS:  CMP     Component Value Date/Time   NA 143 11/01/2018 1454   NA 142 02/15/2017 1054   K 4.0 11/01/2018 1454   K 4.1 02/15/2017 1054   CL 107  11/01/2018 1454   CO2 27 11/01/2018 1454   CO2 25 02/15/2017 1054   GLUCOSE 102 (H) 11/01/2018 1454   GLUCOSE 71 02/15/2017 1054   BUN 13 11/01/2018 1454   BUN 12.3 02/15/2017 1054   CREATININE 0.92 11/01/2018 1454   CREATININE 0.9 02/15/2017 1054   CALCIUM 9.5 11/01/2018 1454   CALCIUM 9.6 02/15/2017 1054   PROT 7.2 11/01/2018 1454   PROT 7.0 02/15/2017 1054   ALBUMIN 4.3 11/01/2018 1454   ALBUMIN 3.8 02/15/2017 1054   AST 22 11/01/2018 1454   AST 25 02/15/2017 1054   ALT 14 11/01/2018 1454   ALT 15 02/15/2017 1054   ALKPHOS 91 11/01/2018 1454   ALKPHOS 62 02/15/2017 1054   BILITOT 0.3 11/01/2018 1454   BILITOT 0.60 02/15/2017 1054   GFRNONAA >60 11/01/2018 1454   GFRAA >60 11/01/2018 1454    INo results found for: SPEP, UPEP  Lab Results  Component Value Date   WBC 3.8 (L) 11/01/2018   NEUTROABS 1.3 (L) 11/01/2018   HGB 13.5 11/01/2018   HCT 39.6 11/01/2018   MCV 93.0 11/01/2018   PLT 230 11/01/2018      Chemistry      Component Value Date/Time   NA 143 11/01/2018 1454   NA 142 02/15/2017 1054   K 4.0 11/01/2018 1454   K 4.1 02/15/2017 1054   CL 107 11/01/2018 1454   CO2 27 11/01/2018 1454   CO2 25 02/15/2017 1054   BUN 13 11/01/2018 1454   BUN 12.3 02/15/2017 1054   CREATININE 0.92 11/01/2018 1454   CREATININE 0.9 02/15/2017 1054      Component Value Date/Time   CALCIUM 9.5 11/01/2018 1454   CALCIUM 9.6 02/15/2017 1054   ALKPHOS 91 11/01/2018 1454   ALKPHOS 62 02/15/2017 1054   AST 22 11/01/2018 1454   AST 25 02/15/2017 1054   ALT 14 11/01/2018 1454   ALT 15 02/15/2017 1054   BILITOT 0.3 11/01/2018 1454   BILITOT 0.60 02/15/2017 1054       No results found for: LABCA2  No components found for: LABCA125  No results for input(s): INR in the last 168 hours.  Urinalysis    Component Value Date/Time   COLORURINE YELLOW 01/02/2018 1441   APPEARANCEUR HAZY (A) 01/02/2018 1441   LABSPEC 1.018 01/02/2018 1441   PHURINE 5.0 01/02/2018 1441    GLUCOSEU NEGATIVE 01/02/2018 1441   HGBUR NEGATIVE 01/02/2018 1441   BILIRUBINUR NEGATIVE 01/02/2018 1441   KETONESUR NEGATIVE 01/02/2018 1441   PROTEINUR NEGATIVE 01/02/2018 1441   NITRITE NEGATIVE 01/02/2018 1441   LEUKOCYTESUR NEGATIVE 01/02/2018 1441     STUDIES: Dg Bone Density  Result Date: 10/09/2018 EXAM: DUAL X-RAY ABSORPTIOMETRY (DXA) FOR BONE MINERAL DENSITY IMPRESSION: Referring Physician:  Chauncey Cruel  Your patient completed a BMD test using Lunar IDXA DXA system ( analysis version: 16 ) manufactured by [employer]. Technologist: AW PATIENT: Name: Candice [name], Candice [name] Patient ID: [id number] Birth Date: 1975-06-09 Height: 64.0 in. Sex: Female Measured: 10/09/2018 Weight: 168.4 lbs. Indications: Anastrazole, Bilateral Ovariectomy (65.51), Breast Cancer History, Estrogen Deficient, Hysterectomy, Omeprazole, Postmenopausal Fractures: None Treatments: Calcium (E943.0), Hormone Therapy For Cancer ASSESSMENT: The BMD measured at Femur Total Left is 0.888 g/cm2 with a T-score of -0.9. This patient is considered normal according to [facility]) criteria. The scan quality is good. Site Region Measured Date Measured Age YA BMD Significant CHANGE T-score DualFemur Total Left 10/09/2018    42.3         -0.9    0.888 g/cm2 AP Spine  L1-L4      10/09/2018    42.3         1.0     1.302 g/cm2 DualFemur Total Mean 10/09/2018    42.3         -0.8    0.906 g/cm2 World Health Organization [facility]) criteria for post-menopausal, Caucasian Women: Normal       T-score at or above -1 SD Osteopenia   T-score between -1 and -2.5 SD Osteoporosis T-score at or below -2.5 SD RECOMMENDATION: 1. All patients should optimize calcium and vitamin D intake. 2. Consider FDA approved medical therapies in postmenopausal women and men aged 35 years and older, based on the following: a. A hip or vertebral (clinical or morphometric) fracture b. T- score < or = -2.5 at the femoral neck or spine after appropriate  evaluation to exclude secondary causes c. Low bone mass (T-score between -1.0 and -2.5 at the femoral neck or spine) and a 10 year probability of a hip fracture > or = 3% or a 10 year probability of a major osteoporosis-related fracture > or = 20% based on the [place]-adapted WHO algorithm d. Clinician judgment and/or patient preferences may indicate treatment for people with 10-year fracture probabilities above or below these levels FOLLOW-UP: Patients with diagnosis of osteoporosis or at high risk for fracture should have regular bone mineral density tests. For patients eligible for Medicare, routine testing is allowed once every 2 years. The testing frequency can be increased to one year for patients who have rapidly progressing disease, those who are receiving or discontinuing medical therapy to restore bone mass, or have additional risk factors. I have reviewed this report and agree with the above findings. [facility] Radiology Electronically Signed   By: Franki Cabot M.D.   On: 10/09/2018 08:44     ELIGIBLE FOR AVAILABLE RESEARCH PROTOCOL: no  ASSESSMENT: 43 y.o. [place] woman currently residing in [place], status post right breast upper inner quadrant biopsy 04/21/2016 for a clinical T2 N0, stage IIa invasive ductal carcinoma, grade 2, estrogen and progesterone receptor positive, HER-2 nonamplified, with an MIB-1 of 20%  (a) biopsy of 2 additional suspicious areas in the right breast 05/12/2016 was benign  (1) started tamoxifen 04/27/2016, Discontinued at the start of chemotherapy  (2) Oncotype DX obtained from the original biopsy showed a score of 14, predicting a 10 year risk of recurrence outside the breast of 9% if the patient's only systemic therapy is tamoxifen for 5 years. It also predicts no benefit from chemotherapy.   (3) patient is not interested in fertility preservation  (4) genetics testing 04/27/2016 through the [facility] gene panel offered by [employer] found no deleterious mutations in [place], ATM, BARD1,  BMPR1A, BRCA1, BRCA2, BRIP1, CHD1, CDK4, CDKN2A, CHEK2, EPCAM (large rearrangement only), MLH1, MSH2, MSH6, MUTYH, NBN, PALB2, PMS2, PTEN, RAD51C, RAD51D, SMAD4, STK11, and TP53. Sequencing was performed for select regions of POLE and POLD1, and large rearrangement analysis was performed for select regions of GREM1.   (5) status post right lumpectomy with sentinel lymph node sampling 06/07/2016 for a pT1c pN1, stage IB invasive ductal carcinoma, grade 2, with negative margins   (6) Mammaprint sent from the final surgical sample was read as high risk, indicating a need for chemotherapy   (7) doxorubicin and cyclophosphamide in dose dense fashion 4  started 07/12/2016, completed 08/23/2016 followed by paclitaxel weekly 12 started 09/06/2016 (received 2 cycles), changed to Abraxane on 09/27/2016 due to reaction to Paclitaxel: Abraxane discontinued after 1 cycle because of the same reaction.  (8) adjuvant radiation 11/01/16-12/19/16 Site/dose:   1. Right Breast: 50 Gy in 25 fractions                          2. Right Breast Boost: 10 Gy in 5 fractions                          3. Right Breast SCV: 50 Gy in 25 fractions    (9) resumed tamoxifen 12/26/2016, discontinued January 2019 in preparation for anastrozole  (10) goserelin started 05/01/2017, stopped 12/13/2017  (11) status post laparoscopic hysterectomy with bilateral salpingo-oophorectomy 01/04/2018 with benign pathology  (12) anastrozole started 06/19/2017  (a) bone density 10/09/2018 is normal (T score of -0.9)  PLAN: Jona is now a little over 2 years out from definitive surgery for her breast cancer with no evidence of disease recurrence.  This is very favorable.  She is tolerating anastrozole generally well and the plan is to continue that a minimum of 5 years.  We discussed her bone density results, which are normal.  Nevertheless I recommended she start vitamin D  1000 units daily.  She has an excellent exercise program.  They are keeping appropriate pandemic precautions  She will have her next mammogram late January.  She will return to see me March of next year  She knows to call for any other issues that may develop before the next visit   Lyndol Vanderheiden, Candice Dad, MD  11/01/18 3:48 PM Medical Oncology and Hematology [facility] [place], [place] [address] Tel. [phone]    Fax. [phone]    I, Wilburn Mylar, am acting as scribe for Dr. Virgie [name]. Jahmani Staup.  I, Lurline Del MD, have reviewed the above documentation for accuracy and completeness, and I agree with the above.

## 2018-11-01 ENCOUNTER — Ambulatory Visit: Payer: Medicaid Other | Attending: Orthopedic Surgery | Admitting: Physical Therapy

## 2018-11-01 ENCOUNTER — Encounter: Payer: Self-pay | Admitting: Physical Therapy

## 2018-11-01 ENCOUNTER — Other Ambulatory Visit: Payer: Self-pay

## 2018-11-01 ENCOUNTER — Inpatient Hospital Stay: Payer: Medicaid Other | Attending: Oncology

## 2018-11-01 ENCOUNTER — Inpatient Hospital Stay (HOSPITAL_BASED_OUTPATIENT_CLINIC_OR_DEPARTMENT_OTHER): Payer: Medicaid Other | Admitting: Oncology

## 2018-11-01 VITALS — BP 104/89 | HR 76 | Temp 97.8°F | Resp 18 | Wt 170.0 lb

## 2018-11-01 DIAGNOSIS — M25562 Pain in left knee: Secondary | ICD-10-CM | POA: Diagnosis present

## 2018-11-01 DIAGNOSIS — Z79811 Long term (current) use of aromatase inhibitors: Secondary | ICD-10-CM | POA: Diagnosis not present

## 2018-11-01 DIAGNOSIS — M25561 Pain in right knee: Secondary | ICD-10-CM | POA: Diagnosis not present

## 2018-11-01 DIAGNOSIS — M62838 Other muscle spasm: Secondary | ICD-10-CM | POA: Diagnosis present

## 2018-11-01 DIAGNOSIS — M6281 Muscle weakness (generalized): Secondary | ICD-10-CM | POA: Insufficient documentation

## 2018-11-01 DIAGNOSIS — D251 Intramural leiomyoma of uterus: Secondary | ICD-10-CM

## 2018-11-01 DIAGNOSIS — C50211 Malignant neoplasm of upper-inner quadrant of right female breast: Secondary | ICD-10-CM | POA: Diagnosis not present

## 2018-11-01 DIAGNOSIS — G8929 Other chronic pain: Secondary | ICD-10-CM | POA: Diagnosis present

## 2018-11-01 DIAGNOSIS — Z170 Estrogen receptor positive status [ER+]: Secondary | ICD-10-CM | POA: Insufficient documentation

## 2018-11-01 LAB — CBC WITH DIFFERENTIAL (CANCER CENTER ONLY)
Abs Immature Granulocytes: 0.01 10*3/uL (ref 0.00–0.07)
Basophils Absolute: 0 10*3/uL (ref 0.0–0.1)
Basophils Relative: 1 %
Eosinophils Absolute: 0.2 10*3/uL (ref 0.0–0.5)
Eosinophils Relative: 6 %
HCT: 39.6 % (ref 36.0–46.0)
Hemoglobin: 13.5 g/dL (ref 12.0–15.0)
Immature Granulocytes: 0 %
Lymphocytes Relative: 49 %
Lymphs Abs: 1.9 10*3/uL (ref 0.7–4.0)
MCH: 31.7 pg (ref 26.0–34.0)
MCHC: 34.1 g/dL (ref 30.0–36.0)
MCV: 93 fL (ref 80.0–100.0)
Monocytes Absolute: 0.3 10*3/uL (ref 0.1–1.0)
Monocytes Relative: 8 %
Neutro Abs: 1.3 10*3/uL — ABNORMAL LOW (ref 1.7–7.7)
Neutrophils Relative %: 36 %
Platelet Count: 230 10*3/uL (ref 150–400)
RBC: 4.26 MIL/uL (ref 3.87–5.11)
RDW: 11.8 % (ref 11.5–15.5)
WBC Count: 3.8 10*3/uL — ABNORMAL LOW (ref 4.0–10.5)
nRBC: 0 % (ref 0.0–0.2)

## 2018-11-01 LAB — CMP (CANCER CENTER ONLY)
ALT: 14 U/L (ref 0–44)
AST: 22 U/L (ref 15–41)
Albumin: 4.3 g/dL (ref 3.5–5.0)
Alkaline Phosphatase: 91 U/L (ref 38–126)
Anion gap: 9 (ref 5–15)
BUN: 13 mg/dL (ref 6–20)
CO2: 27 mmol/L (ref 22–32)
Calcium: 9.5 mg/dL (ref 8.9–10.3)
Chloride: 107 mmol/L (ref 98–111)
Creatinine: 0.92 mg/dL (ref 0.44–1.00)
GFR, Est AFR Am: >60 mL/min (ref >60)
GFR, Est Non Af Am: >60 mL/min (ref >60)
Glucose, Bld: 102 mg/dL — ABNORMAL HIGH (ref 70–99)
Potassium: 4 mmol/L (ref 3.5–5.1)
Sodium: 143 mmol/L (ref 135–145)
Total Bilirubin: 0.3 mg/dL (ref 0.3–1.2)
Total Protein: 7.2 g/dL (ref 6.5–8.1)

## 2018-11-01 MED ORDER — ANASTROZOLE 1 MG PO TABS: 1.0000 mg | ORAL_TABLET | Freq: Every day | ORAL | 4 refills | Status: DC

## 2018-11-01 MED ORDER — VITAMIN D 25 MCG (1000 UNIT) PO TABS: 1000.0000 [IU] | ORAL_TABLET | Freq: Every day | ORAL | 4 refills | Status: AC

## 2018-11-01 NOTE — Therapy (Signed)
[facility] [address] [place], [place], [address] Phone: [phone]   Fax:  [phone]  Physical Therapy Treatment  Patient Details  Name: Candice [name] MRN: [id number] Date of Birth: [birth year] Referring Provider (PT): Dorna Leitz, MD    Encounter Date: 11/01/2018  PT End of Session - 11/01/18 1413    Visit Number  14    Date for PT Re-Evaluation  10/05/18    Authorization Type  Medicaid     Authorization Time Period  Pending medicaid approval; 10/06/18 to 11/16/18    Authorization - Visit Number  11    Authorization - Number of Visits  15    PT Start Time  3244    PT Stop Time  1412    PT Time Calculation (min)  38 min    Activity Tolerance  Patient tolerated treatment well;No increased pain    Behavior During Therapy  WFL for tasks assessed/performed       Past Medical History:  Diagnosis Date  . Breast cancer ([place]) 05/2016   right  . Chronic constipation   . Family history of cancer   . Fibroid, uterine   . IBS (irritable bowel syndrome)    IBS - C (constipation)  . Personal history of chemotherapy   . Personal history of radiation therapy     Past Surgical History:  Procedure Laterality Date  . BARTHOLIN CYST MARSUPIALIZATION  09/04/2002  . BARTHOLIN GLAND CYST EXCISION Left 07/13/2005  . BREAST LUMPECTOMY WITH RADIOACTIVE SEED AND SENTINEL LYMPH NODE BIOPSY Right 06/07/2016   Procedure: BREAST LUMPECTOMY WITH RADIOACTIVE SEED AND SENTINEL LYMPH NODE BIOPSY;  Surgeon: Alphonsa Overall, MD;  Location: [facility];  Service: General;  Laterality: Right;  . HYSTEROSCOPY  2016  . MYOMECTOMY  07/20/2004   Laparotomy  . OVARIAN CYST REMOVAL Right 07/20/2004  . PORTACATH PLACEMENT N/A 07/05/2016   REMOVED Fall 2018 ; Procedure: INSERTION PORT-A-CATH WITH [place];  Surgeon: Alphonsa Overall, MD;  Location: [facility];  Service: General;  Laterality: N/A;  . ROBOTIC ASSISTED  TOTAL HYSTERECTOMY WITH BILATERAL SALPINGO OOPHERECTOMY Bilateral 01/04/2018   Procedure: XI ROBOTIC ASSISTED TOTAL HYSTERECTOMY WITH BILATERAL SALPINGO OOPHORECTOMY;  Surgeon: Isabel Caprice, MD;  [facility];  Service: Gynecology;  Laterality: Bilateral;    There were no vitals filed for this visit.  Subjective Assessment - 11/01/18 1337    Subjective  Pt states that things are going well. She was having more pain the past week than usual. She is still completing her HEP regularly.    Pertinent History  breast cancer; hysterectomy    Patient Stated Goals  walk without pain    Currently in Pain?  No/denies    Pain Onset  More than a month ago                       [facility] Adult PT Treatment/Exercise - 11/01/18 0001      Knee/Hip Exercises: Aerobic   Elliptical  x3 min forward, x3 min backward 5 incline, L1 PT present to discuss importance of adjusting level of activity with change in pain       Knee/Hip Exercises: Seated   Other Seated Knee/Hip Exercises  hip ER/IR with red TB x15 reps each    Sit to Sand  2 sets;10 reps;without UE support   yellow TB around knees      Knee/Hip Exercises: Sidelying   Hip ABduction  Strengthening;Right;Left;1 set;5  reps    Hip ABduction Limitations  with hip ER flexion/extension             PT Education - 11/01/18 1413    Education Details  discussed adjusting HEP to avoid over working areas we are trying to promote recovery    Person(s) Educated  Patient    Methods  Explanation;Handout;Verbal cues    Comprehension  Verbalized understanding;Returned demonstration       PT Short Term Goals - 10/02/18 1126      PT SHORT TERM GOAL #1   Title  Pt will demo consistency and independence with her initial HEP to improve exercise understanding and improve LE strength.     Time  4    Period  Weeks    Status  Achieved    Target Date  09/06/18        PT Long Term Goals - 10/02/18 1126      PT LONG TERM GOAL #1    Title  Pt will have increase in BLE strength to 5/5 MMT which will allow for safe return to gym program on her own after d/c.     Baseline  5/5 MMT    Time  10    Period  Weeks    Status  Achieved      PT LONG TERM GOAL #2   Title  Pt will be able to complete single leg mini squat without UE support and with minimal knee valgus deviation x10 reps which will reflect improvements in LE strength and neuromusular control     Baseline  able to complete without pain, knee valgus noted and weight shifted towards toes    Time  10    Period  Weeks    Status  Partially Met      PT LONG TERM GOAL #3   Title  Pt will have atleast 9pt improvement on her LEFS to reflect significant decrease in her LE disability and pain with daily activity.     Baseline  initial eval was 63/80 and update is 72/80    Time  10    Period  Weeks    Status  Achieved      PT LONG TERM GOAL #4   Title  Pt will report atleast 50% improvement in her pain with daily activity and have good understanding of safe exercise progressions and activity for the gym.     Baseline  atleast 40%    Time  10    Period  Weeks    Status  Partially Met      PT LONG TERM GOAL #5   Title  Pt will be able to walk for atleast 1 hour in her neighborhood without knee pain at a steady pace, in order to maintain a healthy lifestyle.    Time  6    Period  Weeks    Status  New    Target Date  11/16/18            Plan - 11/01/18 1422    Clinical Impression Statement  Pt arrived with reports of increased knee soreness the past week. Therapist made some adjustments to her HEP to promote more periods of relaxation. Pt tends to do more than she needs to in regards to sets and repetitions, which makes it difficult to make proper exercise progressions. Therapist discussed the importance of completing only what is provided on her HEP to ensure that she is using proper technique and adequately loading the knee.  She verbalized understanding and denied  increase in knee pain end of session.    Rehab Potential  Good    PT Frequency  1x / week    PT Duration  6 weeks    PT Treatment/Interventions  ADLs/Self Care Home Management;Cryotherapy;Functional mobility training;Therapeutic activities;Therapeutic exercise;Balance training;Patient/family education;Neuromuscular re-education;Manual techniques;Passive range of motion;Taping;Dry needling    PT Next Visit Plan  progress hip rotation ROM and strength; f/u on knee pain with HEP changes; single leg leg press; step down control 6"; TB to quadruped hip extension if able    PT Home Exercise Plan  Access Code: [email or web address]     Consulted and Agree with Plan of Care  Patient       Patient will benefit from skilled therapeutic intervention in order to improve the following deficits and impairments:  Decreased activity tolerance, Decreased strength, Decreased safety awareness, Impaired flexibility, Pain, Improper body mechanics, Decreased range of motion  Visit Diagnosis: 1. Chronic pain of right knee   2. Chronic pain of left knee   3. Muscle weakness (generalized)   4. Other muscle spasm        Problem List Patient Active Problem List   Diagnosis Date Noted  . Fibroid uterus 01/04/2018  . Fibroid, uterine 08/21/2017  . Port catheter in place 09/06/2016  . Genetic testing 05/09/2016  . Malignant neoplasm of upper-inner quadrant of right breast in female, estrogen receptor positive ([place]) 04/27/2016  . Family history of breast cancer   . Family history of ovarian cancer   . Family history of prostate cancer   . Family history of pancreatic cancer     2:57 PM,11/01/18 Sherol Dade PT, DPT [facility] [address], [place], [place], [address] Phone: [phone]   Fax:  [phone]  Name: Candice [name] MRN: [id number] Date of Birth: [birth year]

## 2018-11-01 NOTE — Patient Instructions (Signed)
Access Code: [email or web address]  URL: [email or web address]  Date: 11/01/2018  Prepared by: Sherol Dade   Exercises  Standing Hip Extension with Resistance - 15 reps - 2 sets - 1x daily  Quadruped Rocking Backward - 15 reps - 1x daily  Clamshell with Resistance - 15 reps - 2-3 sets - 1x daily  Seated Piriformis Stretch - 3 reps - 20 hold - 1x daily  Seated Piriformis Stretch - 3 reps - 30 hold - 1x daily  Half Kneeling Hip Flexor Stretch with Chair - 3 reps - 20 hold - 1x daily  Single Leg Sit to Stand with Arms Extended - 10 reps - 3 sets - 1x daily  Seated Hip Internal Rotation AROM - 15 reps - 1x daily  Side Stepping with Resistance at Feet - 15 reps - 2 sets - 1x daily  Seated Hip External Rotation AROM - 15 reps - 1x daily  Sidelying Hip Abduction at Wall - 15 reps - 3 sets - 1x daily    [facility] [address] [place], [place] [address] Phone # [phone] Fax [phone]

## 2018-11-08 ENCOUNTER — Ambulatory Visit: Payer: Medicaid Other | Admitting: Physical Therapy

## 2018-11-15 ENCOUNTER — Ambulatory Visit: Payer: Medicaid Other | Admitting: Physical Therapy

## 2018-11-15 ENCOUNTER — Telehealth: Payer: Self-pay | Admitting: Physical Therapy

## 2018-11-15 NOTE — Telephone Encounter (Signed)
Pt requested therapist call. PT unable to reach pt regarding today's missed appointment. Left message for her to return call at her earliest convenience.   3:46 PM,11/15/18 [place], [place] at [place]

## 2018-12-14 ENCOUNTER — Telehealth: Payer: Self-pay | Admitting: *Deleted

## 2018-12-14 ENCOUNTER — Other Ambulatory Visit: Payer: Self-pay | Admitting: *Deleted

## 2018-12-14 DIAGNOSIS — Z170 Estrogen receptor positive status [ER+]: Secondary | ICD-10-CM

## 2018-12-14 DIAGNOSIS — C50211 Malignant neoplasm of upper-inner quadrant of right female breast: Secondary | ICD-10-CM

## 2018-12-14 NOTE — Telephone Encounter (Signed)
This RN spoke with pt per her VM- with 2 concerns.  Candice [name] states she developed an "knot" in her inner vaginal wall " that is sore ".  She states it is painful for positioning and walking " I am waddling like a duck "  She denies any discharge.  She had hysteo/BSO last year under .  She states she is also having pain in her right arm - in the axillary.  She states she has the pain " when I stretch it " .    She denies any lumps or cording .  She does not have new swelling ( area is noted as larger then left since surgery which may be s/p fat distrubution ?)  And are is not tender to touch.  This RN discussed post surgical and radiation therapy that could cause scarring that affects movement. Discussed possible benefit from PT with pt and she would like to proceed.  Per vaginal knot- this RN spoke with NP with GYN/onc who states pt needs to be seen by GYN with reference given.  discussed reference office per GYN/ONC - with [place] stating she has been seen remotely by Dr Helane Rima prior to her move to [facility] - upon her return to [place] she attempted to be seen by Dr Helane Rima and was informed " she is not taking new patients."  Discussed vaginal knot may likely be a boil or a Bartholin cyst and discussed home remedies- Candice [name] states she has history of Bartholin's cyst " which I would get often on my right side and Dr Helane Rima would puncture it and let it drain but then she removed the gland and I haven't had an issue until now.  Knot is on left side.  Plan per present and oncoming weekend is pt will use warm compresses and baths for alleviation.  This RN will follow up with GYN on Monday when office reopens.  Candice [name] stated appreciation of discussion and plan.  Referral will be placed for PT as well as this RN will call to Dr Christen Butter for appointment due to pt is known to her remotely on Monday when their office opens.

## 2018-12-17 ENCOUNTER — Telehealth: Payer: Self-pay | Admitting: *Deleted

## 2018-12-17 NOTE — Telephone Encounter (Signed)
This RN called to [facility] and was informed that they are not taking new medicaid patients at this time.  This RN also called to [facility] per pt was seen remotely by Dr Helane Rima for Barholin's cyst. They too are not taking new medicaid pt's ( pt would be considered new ).  This RN left message on Deloise's VM stating above and possible need to be seen by her primary MD due to difficulty getting an urgent appointment presently.  This RN's name and return number given for communication.

## 2018-12-17 NOTE — Telephone Encounter (Signed)
error 

## 2018-12-18 ENCOUNTER — Ambulatory Visit: Payer: BC Managed Care – PPO | Attending: Orthopedic Surgery | Admitting: Physical Therapy

## 2018-12-18 ENCOUNTER — Telehealth: Payer: Self-pay

## 2018-12-18 ENCOUNTER — Other Ambulatory Visit: Payer: Self-pay

## 2018-12-18 ENCOUNTER — Encounter: Payer: Self-pay | Admitting: Physical Therapy

## 2018-12-18 DIAGNOSIS — R293 Abnormal posture: Secondary | ICD-10-CM | POA: Diagnosis present

## 2018-12-18 DIAGNOSIS — M25511 Pain in right shoulder: Secondary | ICD-10-CM | POA: Insufficient documentation

## 2018-12-18 DIAGNOSIS — R102 Pelvic and perineal pain: Secondary | ICD-10-CM

## 2018-12-18 DIAGNOSIS — M25611 Stiffness of right shoulder, not elsewhere classified: Secondary | ICD-10-CM | POA: Diagnosis present

## 2018-12-18 DIAGNOSIS — C50211 Malignant neoplasm of upper-inner quadrant of right female breast: Secondary | ICD-10-CM

## 2018-12-18 DIAGNOSIS — Z170 Estrogen receptor positive status [ER+]: Secondary | ICD-10-CM

## 2018-12-18 NOTE — Telephone Encounter (Signed)
Referral successfully faxed to [facility].  [phone].    Pt aware and voiced appreciation.

## 2018-12-18 NOTE — Therapy (Signed)
[place], [place], [address] Phone: [phone]   Fax:  [phone]  Physical Therapy Evaluation  Patient Details  Name: Candice [name] MRN: [id number] Date of Birth: 12/05/1975 Referring Provider (PT): Dr. Jana Hakim   Encounter Date: 12/18/2018  PT End of Session - 12/18/18 1205    Visit Number  1    Number of Visits  4    Date for PT Re-Evaluation  01/17/19    PT Start Time  1005    PT Stop Time  1045    PT Time Calculation (min)  40 min    Activity Tolerance  Patient tolerated treatment well    Behavior During Therapy  [facility] for tasks assessed/performed       Past Medical History:  Diagnosis Date  . Breast cancer ([place]) 05/2016   right  . Chronic constipation   . Family history of cancer   . Fibroid, uterine   . IBS (irritable bowel syndrome)    IBS - C (constipation)  . Personal history of chemotherapy   . Personal history of radiation therapy     Past Surgical History:  Procedure Laterality Date  . BARTHOLIN CYST MARSUPIALIZATION  09/04/2002  . BARTHOLIN GLAND CYST EXCISION Left 07/13/2005  . BREAST LUMPECTOMY WITH RADIOACTIVE SEED AND SENTINEL LYMPH NODE BIOPSY Right 06/07/2016   Procedure: BREAST LUMPECTOMY WITH RADIOACTIVE SEED AND SENTINEL LYMPH NODE BIOPSY;  Surgeon: Alphonsa Overall, MD;  Location: [facility];  Service: General;  Laterality: Right;  . HYSTEROSCOPY  2016  . MYOMECTOMY  07/20/2004   Laparotomy  . OVARIAN CYST REMOVAL Right 07/20/2004  . PORTACATH PLACEMENT N/A 07/05/2016   REMOVED Fall 2018 ; Procedure: INSERTION PORT-A-CATH WITH [place];  Surgeon: Alphonsa Overall, MD;  Location: [facility];  Service: General;  Laterality: N/A;  . ROBOTIC ASSISTED TOTAL HYSTERECTOMY WITH BILATERAL SALPINGO OOPHERECTOMY Bilateral 01/04/2018   Procedure: XI ROBOTIC ASSISTED TOTAL HYSTERECTOMY WITH BILATERAL SALPINGO OOPHORECTOMY;  Surgeon: Isabel Caprice,  MD;  Location: [facility];  Service: Gynecology;  Laterality: Bilateral;    There were no vitals filed for this visit.   Subjective Assessment - 12/18/18 1159    Subjective  Pain comes and goes under the arm especailly when she is stretching and it catches and then it goes away she has it several times a week, but not every day. She says it has been getting worse in severity and frequency    Patient is accompained by:  Family member    Pertinent History  breast cancer with chemotherapy, lumpectomy with sentinel node biopsy on 06/08/2018, radiation ; toatol hysterectomy 01/04/2018, hx of !BS    Currently in Pain?  Yes    Pain Score  5     Pain Location  Axilla    Pain Orientation  Right    Pain Descriptors / Indicators  Spasm;Shooting    Pain Type  Chronic pain    Pain Radiating Towards  does not radiate    Pain Onset  More than a month ago    Pain Frequency  Intermittent    Aggravating Factors   stretching, reaching,    Pain Relieving Factors  it just passes         [facility] PT Assessment - 12/18/18 0001      Assessment   Medical Diagnosis  right breast cancer     Referring Provider (PT)  Dr. Jana Hakim    Onset Date/Surgical Date  06/07/16  Hand Dominance  Right      Precautions   Precautions  None    Precaution Comments  cancer precautions      Restrictions   Weight Bearing Restrictions  No      Balance Screen   Has the patient fallen in the past 6 months  No    Has the patient had a decrease in activity level because of a fear of falling?   No    Is the patient reluctant to leave their home because of a fear of falling?   No      Home Social worker  Private residence    Living Arrangements  Parent    Available Help at Discharge  Family      Prior Function   Level of Independence  Independent    Vocation  Part time employment    Vocation Requirements  pt is a Pharmacist, hospital     Leisure  pt walks about an hour 3-4 a week , no strengthening for her arm        Cognition   Overall Cognitive Status  Within Functional Limits for tasks assessed      Observation/Other Assessments   Observations  well healed incision in right axilla and breast with fullness visible in left posterior axilla near lats.     Skin Integrity  no open areas     Quick DASH   4.55      Sensation   Light Touch  Not tested      Coordination   Gross Motor Movements are Fluid and Coordinated  Yes   assymtery of scpular movement with arm elevation      Posture/Postural Control   Posture/Postural Control  Postural limitations    Postural Limitations  Rounded Shoulders    Posture Comments  more rounded shoulder on right with  increased size of right supraclavicular fossa       ROM / Strength   AROM / PROM / Strength  AROM;Strength      AROM   Overall AROM Comments  decreased AROM of right shoulder with pain and tightness at end range     Right Shoulder Extension  60 Degrees    Right Shoulder Flexion  150 Degrees    Right Shoulder ABduction  170 Degrees    Right Shoulder External Rotation  90 Degrees    Left Shoulder Extension  65 Degrees    Left Shoulder Flexion  175 Degrees    Left Shoulder ABduction  180 Degrees    Left Shoulder External Rotation  90 Degrees      Strength   Overall Strength  Within functional limits for tasks performed      Palpation   Palpation comment  very tender and tight with trigger points posterior axilla muscluature ( lats and teres major on the right)         LYMPHEDEMA/ONCOLOGY QUESTIONNAIRE - 12/18/18 1032      Type   Cancer Type  Right breast cancer      Lymphedema Assessments   Lymphedema Assessments  Upper extremities      Right Upper Extremity Lymphedema   10 cm Proximal to Olecranon Process  34 cm    Olecranon Process  25 cm    10 cm Proximal to Ulnar Styloid Process  21.5 cm    Just Proximal to Ulnar Styloid Process  15.5 cm    Across Hand at [employer]  19.6 cm    At [employer]  of 2nd Digit  6.2 cm      Left Upper  Extremity Lymphedema   10 cm Proximal to Olecranon Process  35 cm    Olecranon Process  25.3 cm    10 cm Proximal to Ulnar Styloid Process  22 cm    Just Proximal to Ulnar Styloid Process  15.5 cm    Across Hand at [employer]  19.5 cm    At [place] of 2nd Digit  6.2 cm          Quick Dash - 12/18/18 0001    Open a tight or new jar  No difficulty    Do heavy household chores (wash walls, wash floors)  No difficulty    Carry a shopping bag or briefcase  No difficulty    Wash your back  No difficulty    Use a knife to cut food  No difficulty    Recreational activities in which you take some force or impact through your arm, shoulder, or hand (golf, hammering, tennis)  No difficulty    During the past week, to what extent has your arm, shoulder or hand problem interfered with your normal social activities with family, friends, neighbors, or groups?  Not at all    During the past week, to what extent has your arm, shoulder or hand problem limited your work or other regular daily activities  Not at all    Arm, shoulder, or hand pain.  Mild    Tingling (pins and needles) in your arm, shoulder, or hand  Mild    Difficulty Sleeping  No difficulty    DASH Score  4.55 %        Objective measurements completed on examination: See above findings.                PT Short Term Goals - 10/02/18 1126      PT SHORT TERM GOAL #1   Title  Pt will demo consistency and independence with her initial HEP to improve exercise understanding and improve LE strength.     Time  4    Period  Weeks    Status  Achieved    Target Date  09/06/18        PT Long Term Goals - 12/18/18 1305      PT LONG TERM GOAL #1   Title  Pt wil report that pain in right shoulder is decreased to 2/10    Baseline  5/10    Time  4    Period  Weeks    Status  New      PT LONG TERM GOAL #2   Title  Pt will report the pain in her right shoulder has decreased in frequency to one time a week    Baseline   4-5 times a week    Time  4    Period  Weeks    Status  New      PT LONG TERM GOAL #3   Title  Pt will be independent in a home exercise program for stretching to right shoulder    Baseline  no knowledge    Time  4    Period  Weeks    Status  New             Plan - 12/18/18 1206    Clinical Impression Statement  Pt comes to PT 2 1/2 years after right lumpectomy with chemo and radiation with increased pain in right posterior axilla  with tight muscles with trigger points that is increasing in frequency and severity.    Personal Factors and Comorbidities  Comorbidity 3+    Comorbidities  breast cancer with chemo lumpectomy with sentinel node biopsy and radiation. Also had complete hysterctomy about a year ago    Stability/Clinical Decision Making  Stable/Uncomplicated    Clinical Decision Making  Low    Clinical Presentation due to:  pain is increasing in severity and frequency    Rehab Potential  Good    PT Frequency  1x / week    PT Duration  3 weeks    PT Treatment/Interventions  ADLs/Self Care Home Management;Therapeutic activities;Therapeutic exercise;Patient/family education;Manual techniques;Passive range of motion;Taping;Manual lymph drainage;Scar mobilization    PT Next Visit Plan  manual work to right posterior axilla and stretching to right shoulder. teach self myofascial release and HEP    Consulted and Agree with Plan of Care  Patient       Patient will benefit from skilled therapeutic intervention in order to improve the following deficits and impairments:  Decreased activity tolerance, Pain, Decreased range of motion, Increased muscle spasms, Impaired perceived functional ability, Postural dysfunction, Impaired UE functional use  Visit Diagnosis: Abnormal posture - Plan: PT plan of care cert/re-cert  Right shoulder pain, unspecified chronicity - Plan: PT plan of care cert/re-cert  Stiffness of right shoulder, not elsewhere classified - Plan: PT plan of care  cert/re-cert     Problem List Patient Active Problem List   Diagnosis Date Noted  . Fibroid uterus 01/04/2018  . Fibroid, uterine 08/21/2017  . Port catheter in place 09/06/2016  . Genetic testing 05/09/2016  . Malignant neoplasm of upper-inner quadrant of right breast in female, estrogen receptor positive ([place]) 04/27/2016  . Family history of breast cancer   . Family history of ovarian cancer   . Family history of prostate cancer   . Family history of pancreatic cancer    Donato Heinz. Owens Shark PT  [name] 12/18/2018, 1:09 PM  [place], [place], [address] Phone: [phone]   Fax:  [phone]  Name: Candice [name] MRN: [id number] Date of Birth: [birth year]

## 2018-12-26 ENCOUNTER — Ambulatory Visit: Payer: BC Managed Care – PPO

## 2018-12-28 ENCOUNTER — Ambulatory Visit: Payer: BC Managed Care – PPO

## 2019-01-02 ENCOUNTER — Encounter: Payer: Medicaid Other | Admitting: Physical Therapy

## 2019-01-03 ENCOUNTER — Ambulatory Visit: Payer: BC Managed Care – PPO | Attending: Orthopedic Surgery | Admitting: Rehabilitation

## 2019-01-03 DIAGNOSIS — M25511 Pain in right shoulder: Secondary | ICD-10-CM | POA: Insufficient documentation

## 2019-01-03 DIAGNOSIS — R293 Abnormal posture: Secondary | ICD-10-CM | POA: Insufficient documentation

## 2019-01-03 DIAGNOSIS — M25611 Stiffness of right shoulder, not elsewhere classified: Secondary | ICD-10-CM | POA: Insufficient documentation

## 2019-01-09 ENCOUNTER — Ambulatory Visit: Payer: BC Managed Care – PPO | Admitting: Physical Therapy

## 2019-01-09 ENCOUNTER — Encounter: Payer: Self-pay | Admitting: Physical Therapy

## 2019-01-09 ENCOUNTER — Other Ambulatory Visit: Payer: Self-pay

## 2019-01-09 DIAGNOSIS — R293 Abnormal posture: Secondary | ICD-10-CM | POA: Diagnosis present

## 2019-01-09 DIAGNOSIS — M25611 Stiffness of right shoulder, not elsewhere classified: Secondary | ICD-10-CM

## 2019-01-09 DIAGNOSIS — M25511 Pain in right shoulder: Secondary | ICD-10-CM | POA: Diagnosis present

## 2019-01-09 NOTE — Patient Instructions (Addendum)
[email or web address] Categories: active older adults     PELVIC TILT  Lie on back, legs bent. Exhale, tilting top of pelvis back, pubic bone up, to flatten lower back. Inhale, rolling pelvis opposite way, top forward, pubic bone down, arch in back. Repeat __10__ times. Do __2__ sessions per day. Copyright  VHI. All rights reserved.     Lie with hips and knees bent. Slowly inhale, and then exhale. Pull navel toward spine and tighten pelvic floor. Hold for __10_ seconds. Continue to breathe in and out during hold. Rest for _10__ seconds. Repeat __10_ times. Do __2-3_ times a day.   Copyright  VHI. All rights reserved.  Knee Fold   Lie on back, legs bent, arms by sides. Exhale, lifting knee to chest. Inhale, returning. Keep abdominals flat, navel to spine. Repeat __10__ times, alternating legs. Do __2__ sessions per day.  Copyright  VHI. All rights reserved.  Knee Drop   Keep pelvis stable. Without rotating hips, slowly drop knee to side, pause, return to center, bring knee across midline toward opposite hip. Feel obliques engaging. Repeat for ___10_ times each leg.  Copyright  VHI. All rights reserved.  Isometric Hold With Pelvic Floor (Hook-Lying)    Copyright  VHI. All rights reserved.  Heel Slide to Straight   Slide one leg down to straight. Return. Be sure pelvis does not rock forward, tilt, rotate, or tip to side. Do _10__ times. Restabilize pelvis. Repeat with other leg. Do __1-2_ sets, __2_ times per day.  http://ss.exer.us/16   Copyright  VHI. All rights reserved.

## 2019-01-09 NOTE — Therapy (Signed)
[place], [place], [address] Phone: [phone]   Fax:  [phone]  Physical Therapy Treatment  Patient Details  Name: Candice [name] MRN: [id number] Date of Birth: Aug 19, 1975 Referring Provider (PT): Dr. Jana Hakim   Encounter Date: 01/09/2019  PT End of Session - 01/09/19 1046    Visit Number  2    Number of Visits  4    Date for PT Re-Evaluation  01/17/19    Activity Tolerance  Patient tolerated treatment well    Behavior During Therapy  [facility] for tasks assessed/performed       Past Medical History:  Diagnosis Date  . Breast cancer ([place]) 05/2016   right  . Chronic constipation   . Family history of cancer   . Fibroid, uterine   . IBS (irritable bowel syndrome)    IBS - C (constipation)  . Personal history of chemotherapy   . Personal history of radiation therapy     Past Surgical History:  Procedure Laterality Date  . BARTHOLIN CYST MARSUPIALIZATION  09/04/2002  . BARTHOLIN GLAND CYST EXCISION Left 07/13/2005  . BREAST LUMPECTOMY WITH RADIOACTIVE SEED AND SENTINEL LYMPH NODE BIOPSY Right 06/07/2016   Procedure: BREAST LUMPECTOMY WITH RADIOACTIVE SEED AND SENTINEL LYMPH NODE BIOPSY;  Surgeon: Alphonsa Overall, MD;  Location: [facility];  Service: General;  Laterality: Right;  . HYSTEROSCOPY  2016  . MYOMECTOMY  07/20/2004   Laparotomy  . OVARIAN CYST REMOVAL Right 07/20/2004  . PORTACATH PLACEMENT N/A 07/05/2016   REMOVED Fall 2018 ; Procedure: INSERTION PORT-A-CATH WITH [place];  Surgeon: Alphonsa Overall, MD;  Location: [facility];  Service: General;  Laterality: N/A;  . ROBOTIC ASSISTED TOTAL HYSTERECTOMY WITH BILATERAL SALPINGO OOPHERECTOMY Bilateral 01/04/2018   Procedure: XI ROBOTIC ASSISTED TOTAL HYSTERECTOMY WITH BILATERAL SALPINGO OOPHORECTOMY;  Surgeon: Isabel Caprice, MD;  [facility];  Service: Gynecology;  Laterality: Bilateral;    There were  no vitals filed for this visit.  Subjective Assessment - 01/09/19 1007    Subjective  I had a weird thing happen this morning  I was a shooting pain in the breast and it passed. She does feel any heavy . She is still feeling some pain and tightness in her am, but not as fequent as when it was    Pertinent History  breast cancer with chemotherapy, lumpectomy with sentinel node biopsy on 06/08/2018, radiation ; toatol hysterectomy 01/04/2018, hx of !BS    Patient Stated Goals  to get rid of the pain and find our why it is happening    Currently in Pain?  No/denies                       [facility] Adult PT Treatment/Exercise - 01/09/19 0001      Exercises   Exercises  Shoulder;Lumbar      Lumbar Exercises: Supine   Pelvic Tilt  10 reps    Clam  10 reps    Bent Knee Raise  10 reps    Other Supine Lumbar Exercises  single knee to chest with abd engaged       Shoulder Exercises: Supine   Other Supine Exercises  supine with purple ball on thoracic spine and folded pillow at neck for alternating shoulder flexion, horizontal abudtion and lower trunk rotation       Shoulder Exercises: Seated   Other Seated Exercises  neck and upper thoracic streches  Manual Therapy   Manual Therapy  Myofascial release    Manual therapy comments  prolonged pressure to pec major muscle and to posterior shouder at teres major and latts     Myofascial Release  in right sidelying  wiith both hands to lateral chest                PT Short Term Goals - 10/02/18 1126      PT SHORT TERM GOAL #1   Title  Pt will demo consistency and independence with her initial HEP to improve exercise understanding and improve LE strength.     Time  4    Period  Weeks    Status  Achieved    Target Date  09/06/18        PT Long Term Goals - 12/18/18 1305      PT LONG TERM GOAL #1   Title  Pt wil report that pain in right shoulder is decreased to 2/10    Baseline  5/10    Time  4    Period  Weeks     Status  New      PT LONG TERM GOAL #2   Title  Pt will report the pain in her right shoulder has decreased in frequency to one time a week    Baseline  4-5 times a week    Time  4    Period  Weeks    Status  New      PT LONG TERM GOAL #3   Title  Pt will be independent in a home exercise program for stretching to right shoulder    Baseline  no knowledge    Time  4    Period  Weeks    Status  New            Plan - 01/09/19 1047    Clinical Impression Statement  Pt reports her pain is a littel better than it was at eval, but she still has occasional pain in right breast and lateral chest  She is concerned about decreased control of abdomina muscles during deep breathing so transvers abdominal series was taught today Pt did well with exercise    Comorbidities  breast cancer with chemo lumpectomy with sentinel node biopsy and radiation. Also had complete hysterctomy about a year ago    PT Treatment/Interventions  ADLs/Self Care Home Management;Therapeutic activities;Therapeutic exercise;Patient/family education;Manual techniques;Passive range of motion;Taping;Manual lymph drainage;Scar mobilization    PT Next Visit Plan  ask if questions about core exercises and progress as needed. manual work to right oecs and posterior axilla and stretching to right shoulder. teach self myofascial release and HEP    Consulted and Agree with Plan of Care  Patient       Patient will benefit from skilled therapeutic intervention in order to improve the following deficits and impairments:  Decreased activity tolerance, Pain, Decreased range of motion, Increased muscle spasms, Impaired perceived functional ability, Postural dysfunction, Impaired UE functional use  Visit Diagnosis: Abnormal posture  Right shoulder pain, unspecified chronicity  Stiffness of right shoulder, not elsewhere classified     Problem List Patient Active Problem List   Diagnosis Date Noted  . Fibroid uterus 01/04/2018   . Fibroid, uterine 08/21/2017  . Port catheter in place 09/06/2016  . Genetic testing 05/09/2016  . Malignant neoplasm of upper-inner quadrant of right breast in female, estrogen receptor positive ([place]) 04/27/2016  . Family history of breast cancer   . Family history  of ovarian cancer   . Family history of prostate cancer   . Family history of pancreatic cancer    Donato Heinz. Owens Shark PT  [name] 01/09/2019, 10:50 AM  [place], [place], [address] Phone: [phone]   Fax:  [phone]  Name: Zina Fernald MRN: [id number] Date of Birth: 08/12/1975

## 2019-01-14 ENCOUNTER — Ambulatory Visit: Payer: BC Managed Care – PPO

## 2019-01-14 ENCOUNTER — Other Ambulatory Visit: Payer: Self-pay

## 2019-01-14 DIAGNOSIS — R293 Abnormal posture: Secondary | ICD-10-CM

## 2019-01-14 DIAGNOSIS — M25611 Stiffness of right shoulder, not elsewhere classified: Secondary | ICD-10-CM

## 2019-01-14 DIAGNOSIS — M25511 Pain in right shoulder: Secondary | ICD-10-CM

## 2019-01-14 NOTE — Therapy (Signed)
[place], [place], [address] Phone: [phone]   Fax:  [phone]  Physical Therapy Treatment  Patient Details  Name: Candice [name] MRN: [id number] Date of Birth: 03/09/1976 Referring Provider (PT): Dr. Jana Hakim   Encounter Date: 01/14/2019  PT End of Session - 01/14/19 1400    Visit Number  3    Number of Visits  4    Date for PT Re-Evaluation  01/17/19    Authorization Type  Medicaid     Authorization Time Period  Pending medicaid approval; 10/06/18 to 11/16/18; new auth for Cancer Rehab 4 visits from 12/20/18 - 01/16/19    Authorization - Visit Number  3    Authorization - Number of Visits  4    PT Start Time  1302    PT Stop Time  1349    PT Time Calculation (min)  47 min    Activity Tolerance  Patient tolerated treatment well    Behavior During Therapy  [facility] for tasks assessed/performed       Past Medical History:  Diagnosis Date  . Breast cancer ([place]) 05/2016   right  . Chronic constipation   . Family history of cancer   . Fibroid, uterine   . IBS (irritable bowel syndrome)    IBS - C (constipation)  . Personal history of chemotherapy   . Personal history of radiation therapy     Past Surgical History:  Procedure Laterality Date  . BARTHOLIN CYST MARSUPIALIZATION  09/04/2002  . BARTHOLIN GLAND CYST EXCISION Left 07/13/2005  . BREAST LUMPECTOMY WITH RADIOACTIVE SEED AND SENTINEL LYMPH NODE BIOPSY Right 06/07/2016   Procedure: BREAST LUMPECTOMY WITH RADIOACTIVE SEED AND SENTINEL LYMPH NODE BIOPSY;  Surgeon: Alphonsa Overall, MD;  Location: [facility];  Service: General;  Laterality: Right;  . HYSTEROSCOPY  2016  . MYOMECTOMY  07/20/2004   Laparotomy  . OVARIAN CYST REMOVAL Right 07/20/2004  . PORTACATH PLACEMENT N/A 07/05/2016   REMOVED Fall 2018 ; Procedure: INSERTION PORT-A-CATH WITH [place];  Surgeon: Alphonsa Overall, MD;  Location: [place];  Service:  General;  Laterality: N/A;  . ROBOTIC ASSISTED TOTAL HYSTERECTOMY WITH BILATERAL SALPINGO OOPHERECTOMY Bilateral 01/04/2018   Procedure: XI ROBOTIC ASSISTED TOTAL HYSTERECTOMY WITH BILATERAL SALPINGO OOPHORECTOMY;  Surgeon: Isabel Caprice, MD;  Location: [facility];  Service: Gynecology;  Laterality: Bilateral;    There were no vitals filed for this visit.  Subjective Assessment - 01/14/19 1307    Subjective  The pain is better, I mostly just notice it when I move or twist funny. It's like a cramp and I just have to wait for it to pass along the side or under my Rt breast.    Pertinent History  breast cancer with chemotherapy, lumpectomy with sentinel node biopsy on 06/08/2018, radiation ; toatol hysterectomy 01/04/2018, hx of !BS    Patient Stated Goals  to get rid of the pain and find our why it is happening    Currently in Pain?  No/denies                       [facility] Adult PT Treatment/Exercise - 01/14/19 0001      Lumbar Exercises: Supine   Other Supine Lumbar Exercises  Pelvic tilt x10 with 3 sec holds focusing on educating proper deep breathing throughout, then with reverse marching and clam x10 each, added these to [place] program from knee PT      Manual  Therapy   Manual therapy comments  prolonged pressure to pec major muscle and to posterior shoulder at teres major and latts    Myofascial Release  in right sidelying with both hands to lateral chest instructing pt how to return demo with self release laying on side and can try use of tennis ball as well; also in supine at posterior and inferior axilla at areas of tightness.              PT Education - 01/14/19 1357    Education Details  Supine pelvic tilt, then with alternate reverse marching and clam; self myofascial release    Person(s) Educated  Patient    Methods  Explanation;Demonstration;Other (comment);Handout   added these to knee PT Medbridge code   Comprehension  Verbalized understanding;Returned  demonstration;Need further instruction       PT Short Term Goals - 10/02/18 1126      PT SHORT TERM GOAL #1   Title  Pt will demo consistency and independence with her initial HEP to improve exercise understanding and improve LE strength.     Time  4    Period  Weeks    Status  Achieved    Target Date  09/06/18        PT Long Term Goals - 12/18/18 1305      PT LONG TERM GOAL #1   Title  Pt wil report that pain in right shoulder is decreased to 2/10    Baseline  5/10    Time  4    Period  Weeks    Status  New      PT LONG TERM GOAL #2   Title  Pt will report the pain in her right shoulder has decreased in frequency to one time a week    Baseline  4-5 times a week    Time  4    Period  Weeks    Status  New      PT LONG TERM GOAL #3   Title  Pt will be independent in a home exercise program for stretching to right shoulder    Baseline  no knowledge    Time  4    Period  Weeks    Status  New            Plan - 01/14/19 1314    Clinical Impression Statement  Reviewed core exercises from last session and added these to her Medbridge HEP. Then focused on manula therapy to Rt lateral trunk at areas of very palpable tightness and tenderness. Much improved by end of session by softening of tissue noted and pt reporting less tenderness. Instructed her how to duplicate for self myofascial release at home.    Personal Factors and Comorbidities  Comorbidity 3+    Comorbidities  breast cancer with chemo lumpectomy with sentinel node biopsy and radiation. Also had complete hysterctomy about a year ago    Stability/Clinical Decision Making  Stable/Uncomplicated    Rehab Potential  Good    PT Frequency  1x / week    PT Duration  3 weeks    PT Treatment/Interventions  ADLs/Self Care Home Management;Therapeutic activities;Therapeutic exercise;Patient/family education;Manual techniques;Passive range of motion;Taping;Manual lymph drainage;Scar mobilization    PT Next Visit Plan   Medicaid renewal needed next. Pt would like to come 2x/wk for 4 weeks to work towards decreasing muscle tightness at Rt lateral trunk and interested in Visual merchandiser Program.    PT Home Exercise Plan  Access  Code: [email or web address]     Consulted and Agree with Plan of Care  Patient       Patient will benefit from skilled therapeutic intervention in order to improve the following deficits and impairments:  Decreased activity tolerance, Pain, Decreased range of motion, Increased muscle spasms, Impaired perceived functional ability, Postural dysfunction, Impaired UE functional use  Visit Diagnosis: Abnormal posture  Right shoulder pain, unspecified chronicity  Stiffness of right shoulder, not elsewhere classified     Problem List Patient Active Problem List   Diagnosis Date Noted  . Fibroid uterus 01/04/2018  . Fibroid, uterine 08/21/2017  . Port catheter in place 09/06/2016  . Genetic testing 05/09/2016  . Malignant neoplasm of upper-inner quadrant of right breast in female, estrogen receptor positive ([place]) 04/27/2016  . Family history of breast cancer   . Family history of ovarian cancer   . Family history of prostate cancer   . Family history of pancreatic cancer     Otelia Limes, PTA 01/14/2019, 2:05 PM  [place], [place], [address] Phone: [phone]   Fax:  [phone]  Name: Candice [name] MRN: [id number] Date of Birth: 1975/12/04

## 2019-01-15 ENCOUNTER — Ambulatory Visit: Payer: BC Managed Care – PPO | Admitting: Physical Therapy

## 2019-01-15 DIAGNOSIS — R293 Abnormal posture: Secondary | ICD-10-CM | POA: Diagnosis not present

## 2019-01-15 DIAGNOSIS — M25511 Pain in right shoulder: Secondary | ICD-10-CM

## 2019-01-15 DIAGNOSIS — M25611 Stiffness of right shoulder, not elsewhere classified: Secondary | ICD-10-CM

## 2019-01-15 NOTE — Therapy (Signed)
[place], [place], [address] Phone: [phone]   Fax:  [phone]  Physical Therapy Treatment  Patient Details  Name: Candice [name] MRN: [id number] Date of Birth: 1975/09/23 Referring Provider (PT): Dr. Jana Hakim   Encounter Date: 01/15/2019  PT End of Session - 01/15/19 1652    Visit Number  4    Number of Visits  4    Date for PT Re-Evaluation  01/17/19    PT Start Time  1610   pt arrived late   PT Stop Time  1640    PT Time Calculation (min)  30 min    Activity Tolerance  Patient tolerated treatment well    Behavior During Therapy  [facility] for tasks assessed/performed       Past Medical History:  Diagnosis Date  . Breast cancer ([place]) 05/2016   right  . Chronic constipation   . Family history of cancer   . Fibroid, uterine   . IBS (irritable bowel syndrome)    IBS - C (constipation)  . Personal history of chemotherapy   . Personal history of radiation therapy     Past Surgical History:  Procedure Laterality Date  . BARTHOLIN CYST MARSUPIALIZATION  09/04/2002  . BARTHOLIN GLAND CYST EXCISION Left 07/13/2005  . BREAST LUMPECTOMY WITH RADIOACTIVE SEED AND SENTINEL LYMPH NODE BIOPSY Right 06/07/2016   Procedure: BREAST LUMPECTOMY WITH RADIOACTIVE SEED AND SENTINEL LYMPH NODE BIOPSY;  Surgeon: Alphonsa Overall, MD;  Location: [place] [facility];  Service: General;  Laterality: Right;  . HYSTEROSCOPY  2016  . MYOMECTOMY  07/20/2004   Laparotomy  . OVARIAN CYST REMOVAL Right 07/20/2004  . PORTACATH PLACEMENT N/A 07/05/2016   REMOVED Fall 2018 ; Procedure: INSERTION PORT-A-CATH WITH [place];  Surgeon: Alphonsa Overall, MD;  Location: [place];  Service: General;  Laterality: N/A;  . ROBOTIC ASSISTED TOTAL HYSTERECTOMY WITH BILATERAL SALPINGO OOPHERECTOMY Bilateral 01/04/2018   Procedure: XI ROBOTIC ASSISTED TOTAL HYSTERECTOMY WITH BILATERAL SALPINGO OOPHORECTOMY;  Surgeon:  Isabel Caprice, MD;  Location[facility];  Service: Gynecology;  Laterality: Bilateral;    There were no vitals filed for this visit.  Subjective Assessment - 01/15/19 1642    Subjective  Pt says she is doing much better.  She still has the pain, but it is not as often as it was.  It is definitely better    Pertinent History  breast cancer with chemotherapy, lumpectomy with sentinel node biopsy on 06/08/2018, radiation ; toatol hysterectomy 01/04/2018, hx of !BS    Currently in Pain?  No/denies         [facility] PT Assessment - 01/15/19 0001      Assessment   Medical Diagnosis          AROM   Right Shoulder Flexion  165 Degrees                   [facility] Adult PT Treatment/Exercise - 01/15/19 0001      Self-Care   Self-Care  Other Self-Care Comments    Other Self-Care Comments   instructed in sefl myofascial release techniques with tennis ball.       Exercises   Other Exercises   Instructed in stretches and verabally reviewed strenthenin exercises of Strength ABC program Pt is very familiar with these exercises as she has been an exerciser before                PT Short Term Goals - 10/02/18  [place] #1   Title  Pt will demo consistency and independence with her initial HEP to improve exercise understanding and improve LE strength.     Time  4    Period  Weeks    Status  Achieved    Target Date  09/06/18        PT Long Term Goals - 01/15/19 1653      PT LONG TERM GOAL #1   Title  Pt wil report that pain in right shoulder is decreased to 2/10    Baseline  5/10 on eval, pt says it doesn't happen as regulaly but when it does happen it is still 5/10    Status  Partially Met      PT LONG TERM GOAL #2   Title  Pt will report the pain in her right shoulder has decreased in frequency to one time a week    Baseline  4-5 times a week on eval, it has decreased to about once a week    Status  Achieved      PT LONG TERM GOAL #3   Title  Pt  will be independent in a home exercise program for stretching to right shoulder    Status  Achieved            Plan - 01/15/19 1649    Clinical Impression Statement  Pt is doing well.  She feels that she can continue to manage her symptoms at home with increased exercise that she has been taught and with self myofascial release techniques.  She will work on this at home and contact her MD if she needs to come back for another session    Comorbidities  breast cancer with chemo lumpectomy with sentinel node biopsy and radiation. Also had complete hysterctomy about a year ago    PT Next Visit Plan  Discharge this episode    PT Home Exercise Plan  Access Code: [phone]        Patient will benefit from skilled therapeutic intervention in order to improve the following deficits and impairments:  Decreased activity tolerance, Pain, Decreased range of motion, Increased muscle spasms, Impaired perceived functional ability, Postural dysfunction, Impaired UE functional use  Visit Diagnosis: Abnormal posture  Right shoulder pain, unspecified chronicity  Stiffness of right shoulder, not elsewhere classified     Problem List Patient Active Problem List   Diagnosis Date Noted  . Fibroid uterus 01/04/2018  . Fibroid, uterine 08/21/2017  . Port catheter in place 09/06/2016  . Genetic testing 05/09/2016  . Malignant neoplasm of upper-inner quadrant of right breast in female, estrogen receptor positive ([place]) 04/27/2016  . Family history of breast cancer   . Family history of ovarian cancer   . Family history of prostate cancer   . Family history of pancreatic cancer    PHYSICAL THERAPY DISCHARGE SUMMARY  Visits from Start of Care: 4  Current functional level related to goals / functional outcomes: Pt is doing much better and feels that she can manage her symptoms    Remaining deficits: occaisional pain with reaching    Education / Equipment: Home exercise   Plan: Patient agrees  to discharge.  Patient goals were partially met. Patient is being discharged due to not returning since the last visit.  ?????    Donato Heinz. Owens Shark, PT  [name] 01/15/2019, 4:55 PM  [place], [place],  [address] Phone: [phone]   Fax:  [phone]  Name: Candice [name] MRN: [id number] Date of Birth: Nov 29, 1975

## 2019-01-24 ENCOUNTER — Ambulatory Visit
Admission: RE | Admit: 2019-01-24 | Discharge: 2019-01-24 | Disposition: A | Discharge status: Home / Self Care | Payer: BC Managed Care – PPO | Source: Ambulatory Visit | Attending: Family Medicine | Admitting: Family Medicine

## 2019-01-24 ENCOUNTER — Other Ambulatory Visit: Payer: Self-pay | Admitting: Family Medicine

## 2019-01-24 DIAGNOSIS — S99921A Unspecified injury of right foot, initial encounter: Secondary | ICD-10-CM

## 2019-04-20 ENCOUNTER — Other Ambulatory Visit: Payer: Self-pay

## 2019-04-20 ENCOUNTER — Emergency Department (HOSPITAL_COMMUNITY)
Admission: EM | Admit: 2019-04-20 | Discharge: 2019-04-20 | Disposition: A | Discharge status: Home / Self Care | Payer: BC Managed Care – PPO | Attending: Emergency Medicine | Admitting: Emergency Medicine

## 2019-04-20 ENCOUNTER — Encounter (HOSPITAL_COMMUNITY): Payer: Self-pay

## 2019-04-20 ENCOUNTER — Emergency Department (HOSPITAL_COMMUNITY): Payer: BC Managed Care – PPO

## 2019-04-20 DIAGNOSIS — R1084 Generalized abdominal pain: Secondary | ICD-10-CM | POA: Diagnosis not present

## 2019-04-20 DIAGNOSIS — Z79899 Other long term (current) drug therapy: Secondary | ICD-10-CM | POA: Insufficient documentation

## 2019-04-20 DIAGNOSIS — M549 Dorsalgia, unspecified: Secondary | ICD-10-CM

## 2019-04-20 DIAGNOSIS — K5900 Constipation, unspecified: Secondary | ICD-10-CM | POA: Diagnosis not present

## 2019-04-20 DIAGNOSIS — M545 Low back pain: Secondary | ICD-10-CM | POA: Insufficient documentation

## 2019-04-20 LAB — COMPREHENSIVE METABOLIC PANEL
ALT: 17 U/L (ref 0–44)
AST: 26 U/L (ref 15–41)
Albumin: 4.3 g/dL (ref 3.5–5.0)
Alkaline Phosphatase: 101 U/L (ref 38–126)
Anion gap: 9 (ref 5–15)
BUN: 16 mg/dL (ref 6–20)
CO2: 26 mmol/L (ref 22–32)
Calcium: 9.4 mg/dL (ref 8.9–10.3)
Chloride: 106 mmol/L (ref 98–111)
Creatinine, Ser: 0.81 mg/dL (ref 0.44–1.00)
GFR calc Af Amer: >60 mL/min (ref >60)
GFR calc non Af Amer: >60 mL/min (ref >60)
Glucose, Bld: 100 mg/dL — ABNORMAL HIGH (ref 70–99)
Potassium: 4 mmol/L (ref 3.5–5.1)
Sodium: 141 mmol/L (ref 135–145)
Total Bilirubin: 0.6 mg/dL (ref 0.3–1.2)
Total Protein: 7.4 g/dL (ref 6.5–8.1)

## 2019-04-20 LAB — URINALYSIS, ROUTINE W REFLEX MICROSCOPIC
Bilirubin Urine: NEGATIVE
Glucose, UA: NEGATIVE mg/dL
Hgb urine dipstick: NEGATIVE
Ketones, ur: NEGATIVE mg/dL
Leukocytes,Ua: NEGATIVE
Nitrite: NEGATIVE
Protein, ur: NEGATIVE mg/dL
Specific Gravity, Urine: >1.046 — ABNORMAL HIGH (ref 1.005–1.030)
pH: 6 (ref 5.0–8.0)

## 2019-04-20 LAB — CBC
HCT: 40.3 % (ref 36.0–46.0)
Hemoglobin: 13.8 g/dL (ref 12.0–15.0)
MCH: 31.4 pg (ref 26.0–34.0)
MCHC: 34.2 g/dL (ref 30.0–36.0)
MCV: 91.6 fL (ref 80.0–100.0)
Platelets: 236 10*3/uL (ref 150–400)
RBC: 4.4 MIL/uL (ref 3.87–5.11)
RDW: 12.1 % (ref 11.5–15.5)
WBC: 3.9 10*3/uL — ABNORMAL LOW (ref 4.0–10.5)
nRBC: 0 % (ref 0.0–0.2)

## 2019-04-20 LAB — LIPASE, BLOOD: Lipase: 43 U/L (ref 11–51)

## 2019-04-20 MED ORDER — SODIUM CHLORIDE (PF) 0.9 % IJ SOLN
INTRAMUSCULAR | Status: AC
  Filled 2019-04-20: qty 50

## 2019-04-20 MED ORDER — DOCUSATE SODIUM 100 MG PO CAPS: 100.0000 mg | ORAL_CAPSULE | Freq: Two times a day (BID) | ORAL | 0 refills | Status: DC

## 2019-04-20 MED ORDER — IOHEXOL 300 MG/ML  SOLN
100.0000 mL | Freq: Once | INTRAMUSCULAR | Status: AC | PRN
  Administered 2019-04-20: 18:00:00 100 mL via INTRAVENOUS

## 2019-04-20 MED ORDER — SODIUM CHLORIDE 0.9% FLUSH: 3.0000 mL | Freq: Once | INTRAVENOUS | Status: DC

## 2019-04-20 NOTE — ED Notes (Signed)
Pt ambulatory with steady gait and no assistance to the the bathroom Pt obtaining U/A

## 2019-04-20 NOTE — Discharge Instructions (Addendum)
As we discussed, your work-up today was reassuring.  There was no specific reason for your symptoms.  This could be IBS flaring up.  As we discussed, you will need to follow-up with referred GI doctor.  Take stool softeners as directed.  Additionally, as you mentioned, your scan showed a subtle sclerotic area on L4.  This does not appear to be new as it was seen in your previous MRI.  Return the emergency department for any worsening pain, vomiting, fevers or any other worsening or concerning symptoms.

## 2019-04-20 NOTE — ED Triage Notes (Signed)
Patient c/o intermittent left lower back pain and more persistent since last night.  Patient states she is having abdominal distention and constipation for the past few weeks. Patient states the abdominal issues started about the same time as the left lower back pain.

## 2019-04-20 NOTE — ED Notes (Signed)
She is taken to CT at this time. She remains in no distress.

## 2019-04-20 NOTE — ED Provider Notes (Signed)
[facility] DEPT Provider Note   CSN: [id number] Arrival date & time: 04/20/19  1527     History Chief Complaint  Patient presents with   Back Pain   abdominal distention   Abdominal Pain    Candice [name] is a 44 y.o. female history of breast cancer (2018), irritable bowel syndrome who presents for evaluation of intermittent left lower back pain and generalized abdominal pain and bloating.  She states that she has had some mild pain in the left lower back area that has been ongoing for last few weeks.  She states it would come and go but is not giving her much issues.  Last night, she felt the pain became more constant more severe and would not let off.  She describes as a "searing" type pain.  Additionally, she states that over the last few weeks, she has had some mild generalized abdominal distention that has progressively gotten worse.  Over the last few days, she also started developing some generalized abdominal pain.  She states she feels like she is bloated and cannot get a full deep breath in because her so much pressure in her abdomen.  She feels like the 2 pains that she had experiencing are separate and states that they are not connected to each other.  She also reports that she has been constipated.  She had a small bowel movement on Thursday after using an enema but none since then.  She thinks she may have still been passing gas but states it is lower than normal.  She has had no appetite and states that as soon as she eats just a little food, she feels full.  She has not noted any fevers, nausea/vomiting, chest pain, difficulty breathing, dysuria, hematuria.  She is a history of hysterectomy and oophorectomy.  No other abdominal surgeries.  She is not currently receiving any treatments.  The history is provided by the patient.       Past Medical History:  Diagnosis Date   Breast cancer ([place]) 05/2016   right   Chronic constipation     Family history of cancer    Fibroid, uterine    IBS (irritable bowel syndrome)    IBS - C (constipation)   Personal history of chemotherapy    Personal history of radiation therapy     Patient Active Problem List   Diagnosis Date Noted   Fibroid uterus 01/04/2018   Fibroid, uterine 08/21/2017   Port catheter in place 09/06/2016   Genetic testing 05/09/2016   Malignant neoplasm of upper-inner quadrant of right breast in female, estrogen receptor positive ([place]) 04/27/2016   Family history of breast cancer    Family history of ovarian cancer    Family history of prostate cancer    Family history of pancreatic cancer     Past Surgical History:  Procedure Laterality Date   BARTHOLIN CYST MARSUPIALIZATION  09/04/2002   BARTHOLIN GLAND CYST EXCISION Left 07/13/2005   BREAST LUMPECTOMY WITH RADIOACTIVE SEED AND SENTINEL LYMPH NODE BIOPSY Right 06/07/2016   Procedure: BREAST LUMPECTOMY WITH RADIOACTIVE SEED AND SENTINEL LYMPH NODE BIOPSY;  Surgeon: Alphonsa Overall, MD;  Location: [facility];  Service: General;  Laterality: Right;   HYSTEROSCOPY  2016   MYOMECTOMY  07/20/2004   Laparotomy   OVARIAN CYST REMOVAL Right 07/20/2004   PORTACATH PLACEMENT N/A 07/05/2016   REMOVED Fall 2018 ; Procedure: INSERTION PORT-A-CATH WITH [place];  Surgeon: Alphonsa Overall, MD;  Location: [facility];  Service: General;  Laterality: N/A;   ROBOTIC ASSISTED TOTAL HYSTERECTOMY WITH BILATERAL SALPINGO OOPHERECTOMY Bilateral 01/04/2018   Procedure: XI ROBOTIC ASSISTED TOTAL HYSTERECTOMY WITH BILATERAL SALPINGO OOPHORECTOMY;  Surgeon: Isabel Caprice, MD;  Location: [facility];  Service: Gynecology;  Laterality: Bilateral;     OB History    Gravida  0   Para  0   Term  0   Preterm  0   AB  0   Living  0     SAB  0   TAB  0   Ectopic  0   Multiple  0   Live Births  0           Family History  Problem Relation Age of Onset   Breast cancer  Mother 33   Pancreatic cancer Paternal Grandmother    Prostate cancer Paternal Grandfather    Ovarian cancer Other        MGMs sister   Breast cancer Other        mother's maternal first cousin    Social History   Tobacco Use   Smoking status: Never Smoker   Smokeless tobacco: Never Used  Substance Use Topics   Alcohol use: Yes    Alcohol/week: 1.0 standard drinks    Types: 1 Glasses of wine per week    Comment: once per month   Drug use: No    Home Medications Prior to Admission medications   Medication Sig Start Date End Date Taking? Authorizing Provider  anastrozole (ARIMIDEX) 1 MG tablet Take 1 tablet (1 mg total) by mouth daily. 11/01/18  Yes Magrinat, Virgie Dad, MD  cholecalciferol (VITAMIN D3) 25 MCG (1000 UT) tablet Take 1 tablet (1,000 Units total) by mouth daily. 11/01/18  Yes Magrinat, Virgie Dad, MD  naproxen (NAPROSYN) 500 MG tablet Take 1,000 mg by mouth every 12 (twelve) hours as needed for pain. 04/11/19  Yes [provider]  omeprazole (PRILOSEC) 40 MG capsule Take 40 mg by mouth daily. 04/02/19  Yes [provider]  vitamin B-12 (CYANOCOBALAMIN) 100 MCG tablet Take 100 mcg by mouth daily.   Yes [provider]  docusate sodium (COLACE) 100 MG capsule Take 1 capsule (100 mg total) by mouth every 12 (twelve) hours. 04/20/19   Volanda Napoleon, PA-C    Allergies    Paclitaxel and Amoxicillin  Review of Systems   Review of Systems  Constitutional: Negative for fever.  Respiratory: Negative for cough and shortness of breath.   Cardiovascular: Negative for chest pain.  Gastrointestinal: Positive for abdominal distention, abdominal pain and constipation. Negative for nausea and vomiting.  Genitourinary: Negative for dysuria and hematuria.  Musculoskeletal: Positive for back pain.  Neurological: Negative for headaches.  All other systems reviewed and are negative.   Physical Exam Updated Vital Signs BP 127/87 (BP Location: Right  Arm)    Pulse 75    Temp 98.2 F (36.8 C) (Oral)    Resp 15    Ht 5' 4.5" (1.638 m)    Wt 77.1 kg    SpO2 99%    BMI 28.73 kg/m   Physical Exam Vitals and nursing note reviewed.  Constitutional:      Appearance: Normal appearance. She is well-developed.     Comments: Sitting comfortably on examination table  HENT:     Head: Normocephalic and atraumatic.  Eyes:     General: Lids are normal.     Conjunctiva/sclera: Conjunctivae normal.     Pupils: Pupils are equal, round,  and reactive to light.  Cardiovascular:     Rate and Rhythm: Normal rate and regular rhythm.     Pulses: Normal pulses.     Heart sounds: Normal heart sounds. No murmur. No friction rub. No gallop.   Pulmonary:     Effort: Pulmonary effort is normal.     Breath sounds: Normal breath sounds.     Comments: Lungs clear to auscultation bilaterally.  Symmetric chest rise.  No wheezing, rales, rhonchi. Abdominal:     General: Bowel sounds are normal. There is distension.     Palpations: Abdomen is soft. Abdomen is not rigid.     Tenderness: There is generalized abdominal tenderness. There is no right CVA tenderness, left CVA tenderness or guarding.     Comments: Slight abdominal distention noted.  Bowel sounds are good.  Generalized tenderness with no focal point.  No rigidity, guarding.  No CVA tenderness noted bilaterally.  Musculoskeletal:        General: Normal range of motion.     Cervical back: Full passive range of motion without pain.       Back:     Comments: Tenderness palpation noted to paraspinal muscles of the lower left lumbar region.  No midline L-spine tenderness.  Skin:    General: Skin is warm and dry.     Capillary Refill: Capillary refill takes less than 2 seconds.  Neurological:     Mental Status: She is alert and oriented to person, place, and time.  Psychiatric:        Speech: Speech normal.     ED Results / Procedures / Treatments   Labs (all labs ordered are listed, but only abnormal  results are displayed) Labs Reviewed  COMPREHENSIVE METABOLIC PANEL - Abnormal; Notable for the following components:      Result Value   Glucose, Bld 100 (*)    All other components within normal limits  CBC - Abnormal; Notable for the following components:   WBC 3.9 (*)    All other components within normal limits  URINALYSIS, ROUTINE W REFLEX MICROSCOPIC - Abnormal; Notable for the following components:   Color, Urine STRAW (*)    Specific Gravity, Urine >1.046 (*)    All other components within normal limits  LIPASE, BLOOD    EKG None  Radiology CT ABDOMEN PELVIS W CONTRAST  Result Date: 04/20/2019 CLINICAL DATA:  Intermittent LEFT lower back and abdominal pain since last night, abdominal distension and constipation for few weeks, past history of RIGHT breast cancer, irritable bowel syndrome EXAM: CT ABDOMEN AND PELVIS WITH CONTRAST TECHNIQUE: Multidetector CT imaging of the abdomen and pelvis was performed using the standard protocol following bolus administration of intravenous contrast. Sagittal and coronal MPR images reconstructed from axial data set. CONTRAST:  155mL OMNIPAQUE IOHEXOL 300 MG/ML SOLN IV. No oral contrast. COMPARISON:  None Correlation: MR lumbar spine 07/18/2017 FINDINGS: Lower chest: Lung bases clear Hepatobiliary: Gallbladder and liver normal appearance Pancreas: Normal appearance Spleen: Normal appearance Adrenals/Urinary Tract: Adrenal glands, kidneys, ureters, and bladder normal appearance Stomach/Bowel: Low lying cecum in pelvis. Normal appendix RIGHT pelvis. Stomach and bowel loops normal appearance Vascular/Lymphatic: Vascular structures patent. Few normal sized retroperitoneal lymph nodes without adenopathy. Reproductive: Uterus and ovaries surgically absent Other: Tiny amount of free pelvic fluid. No free air. No hernia. No inflammatory process. Musculoskeletal: Subtle nonspecific sclerotic focus in the anterior L4 vertebral body unchanged from prior MR.  IMPRESSION: No acute intra-abdominal or intrapelvic abnormalities. Post hysterectomy and BILATERAL oophorectomy. Electronically Signed  By: Lavonia Dana M.D.   On: 04/20/2019 18:08   CT L-SPINE NO CHARGE  Result Date: 04/20/2019 CLINICAL DATA:  Intermittent low back pain which worsened became more persistent last night. EXAM: CT LUMBAR SPINE WITHOUT CONTRAST TECHNIQUE: Multidetector CT imaging of the lumbar spine was performed without intravenous contrast administration. Multiplanar CT image reconstructions were also generated. COMPARISON:  None. FINDINGS: Segmentation: Standard. Alignment: Normal. Vertebrae: No acute fracture or focal pathologic process. Paraspinal and other soft tissues: See report of dedicated abdomen and pelvis CT scan this same day. Disc levels: Intervertebral disc space height is maintained at all levels. IMPRESSION: Negative lumbar spine CT. Electronically Signed   By: Inge Rise M.D.   On: 04/20/2019 17:59    Procedures Procedures (including critical care time)  Medications Ordered in ED Medications  sodium chloride flush (NS) 0.9 % injection 3 mL (has no administration in time range)  sodium chloride (PF) 0.9 % injection (has no administration in time range)  iohexol (OMNIPAQUE) 300 MG/ML solution 100 mL (100 mLs Intravenous Contrast Given 04/20/19 1739)    ED Course  I have reviewed the triage vital signs and the nursing notes.  Pertinent labs & imaging results that were available during my care of the patient were reviewed by me and considered in my medical decision making (see chart for details).    MDM Rules/Calculators/A&P                      44 y.o. F past medical history of breast cancer who presents for evaluation of generalized abdominal pain, left lower back pain, constipation that has been ongoing for the last few weeks but worsened last night.  No fevers, nausea/vomiting.  History of hysterectomy and oophorectomy. Patient is afebrile, non-toxic  appearing, sitting comfortably on examination table. Vital signs reviewed and stable.  On exam, she does have some mild generalized abdominal distention as well as pain.  No rigidity, guarding.  Plan to check labs.  Given her history of cancer, will plan for CT abdomen pelvis for any evaluation of recurrence of cancer that would be causing her symptoms.  CBC shows leukopenia of 3.9.  Otherwise stable.  CMP is unremarkable.  Lipase is unremarkable.  Urine negative for any acute abnormalities.  CT on pelvis shows no acute intra-abdominal or intrapelvic abnormalities.  Her CT L-spine did not show any acute abnormalities.  There is mention of sclerotic area on her lumbar spine but was seen previously on her MRI does not appear to be new.  Discussed results with patient.  Patient is sitting comfortably no signs of distress.  She has a history of IBS-C and has been seen by GI previously.  She has not seen them in about a year.  I discussed with her that this could be contributing to her symptoms and instructed her to follow-up with her GI doctor as directed. At this time, patient exhibits no emergent life-threatening condition that require further evaluation in ED or admission. Patient had ample opportunity for questions and discussion. All patient's questions were answered with full understanding. Strict return precautions discussed. Patient expresses understanding and agreement to plan.   Portions of this note were generated with [employer]. Dictation errors may occur despite best attempts at proofreading.   Final Clinical Impression(s) / ED Diagnoses Final diagnoses:  Back pain  Generalized abdominal pain  Constipation, unspecified constipation type    Rx / DC Orders ED Discharge Orders  Ordered    docusate sodium (COLACE) 100 MG capsule  Every 12 hours     04/20/19 1929           Desma Mcgregor 04/20/19 2304    Malvin Johns, MD 04/20/19 2340

## 2019-05-02 ENCOUNTER — Telehealth: Payer: Self-pay | Admitting: *Deleted

## 2019-05-02 NOTE — Telephone Encounter (Signed)
This RN received VM from pt stating she has applied for life insurance who states they requested her medical records to complete application- her medical records have not been received.  She states she has called several times " but I do not know who to speak to or what the status is regarding what I have requested "  She states medical records were requested weeks ago " but I do not know what has happened or who to ask or talk to "  Note pt was tearful in above statements.  This RN transferred call to office director for appropriate follow up due to this RN does not know the process for requesting medical records.  This RN did return call to pt to inform her of above action- obtained identified VM- message left.

## 2019-05-07 ENCOUNTER — Other Ambulatory Visit: Payer: Self-pay | Admitting: Oncology

## 2019-05-07 ENCOUNTER — Ambulatory Visit
Admission: RE | Admit: 2019-05-07 | Discharge: 2019-05-07 | Disposition: A | Discharge status: Home / Self Care | Payer: Medicaid Other | Source: Ambulatory Visit | Attending: Oncology | Admitting: Oncology

## 2019-05-07 DIAGNOSIS — C50211 Malignant neoplasm of upper-inner quadrant of right female breast: Secondary | ICD-10-CM

## 2019-05-07 DIAGNOSIS — Z170 Estrogen receptor positive status [ER+]: Secondary | ICD-10-CM

## 2019-05-07 DIAGNOSIS — N6320 Unspecified lump in the left breast, unspecified quadrant: Secondary | ICD-10-CM

## 2019-05-07 DIAGNOSIS — D251 Intramural leiomyoma of uterus: Secondary | ICD-10-CM

## 2019-05-16 ENCOUNTER — Telehealth: Payer: Self-pay | Admitting: *Deleted

## 2019-05-16 NOTE — Telephone Encounter (Signed)
Sister Barron Alvine [phone]) [facility] note requesting medical records for [facility] for insurance.   "Process started November; Insurance company has reached out a few times.  A [employer] form was signed to release records.  She is running out of time to apply for insurance and needs records soon.  Who do I need to speak with about this matter."    Provided [facility] department manages records and forms to release records.  This nurse left messages with [employer] with above information.  Clair Gulling will call [employer] and return call to office.

## 2019-05-20 NOTE — Telephone Encounter (Signed)
"  Candice [name] calling to check status of medical records request.  Candice [name] has called twelve times since November."  Again advised to speak with [facility] department.  Received voicemail with transfer attempts.   No previously signed [employer] located within EMR documents or media by this nurse at this time.  Provided toll free fax [phone] to resubmit request.

## 2019-05-30 ENCOUNTER — Other Ambulatory Visit: Payer: Self-pay | Admitting: *Deleted

## 2019-05-30 DIAGNOSIS — Z170 Estrogen receptor positive status [ER+]: Secondary | ICD-10-CM

## 2019-05-30 DIAGNOSIS — C50211 Malignant neoplasm of upper-inner quadrant of right female breast: Secondary | ICD-10-CM

## 2019-06-02 NOTE — Progress Notes (Signed)
[place]  Telephone:([phone] Fax:(336) [phone]     ID: [name] DOB: 1975/06/07  MR#: [id number]  CHE#:[id number]  Patient Care Team: Jonathon Jordan, MD as PCP - General (Family Medicine) Alphonsa Overall, MD as Consulting Physician (General Surgery) Bernabe Dorce, Virgie Dad, MD as Consulting Physician (Oncology) Eppie Gibson, MD as Attending Physician (Radiation Oncology) Cheryll Cockayne, MD as Referring Physician (Surgical Oncology) Delice Bison, Charlestine Massed, NP as Nurse Practitioner (Hematology and Oncology) Isabel Caprice, MD as Consulting Physician (Gynecologic Oncology) OTHER MD:  CHIEF COMPLAINT: estrogen receptor positive breast cancer  CURRENT TREATMENT:  anastrozole   INTERVAL HISTORY: Pailynn returns today for follow-up of her estrogen receptor positive breast cancer accompanied by her sister.   She continues on anastrozole, with good tolerance.  Her hot flushes and night sweats are better.  Her most recent bone density screening on 10/09/2018 showed a T-score of -0.9, which is considered normal.  Since her last visit, she underwent bilateral diagnostic mammography with tomography and left breast ultrasonographhy at [facility] on 05/07/2019 showing: breast density category C; no evidence of malignancy in either breast; focally dilated duct in left breast without intraductal mass or debris.  She also underwent right foot x-ray on 01/24/2019 for pain in her 4th toe. This showed an acute, obliquely oriented, minimally displaced fracture of the 4th toe.   She also presented to the ED on 04/20/2019 with worsened generalized abdominal pain, left lower back pain, and constipation. Pelvis and lumbar spine CT's were both negative for acute abnormalities. She was given colace and recommended to follow up with her GI doctor for her history of IBS-C.   REVIEW OF SYSTEMS: Taygen continues to have constipation problems.  Occasionally she uses a colon  cleanse.  This is not a new issue and has been present for many years.  She has seen GI and they have suggested MiraLAX daily she has a good diet mostly vegetables, and make sure to drink 4 bottles of water a day.  She is also continuing to exercise regularly.  Despite all this she is gaining weight and this is a big issue for her.    BREAST CANCER HISTORY: From the original intake note:  Julian [facility] [place] in December, at which time a mass in the right breast was noted. She was referred for BCCCP and screening mammography was obtained, showing a possible mass in the right breast.on 04/21/2016 she underwent right diagnostic mammography with tomography and ultrasonography at the breast Center, and this confirmedan irregular spiculat in the right breast measuring 1.8 cm, which was not palpable to the mammographer. Ultrasonography confirmed an irregular hypoechoic right breast mass at the 12:30 o'clock position 4 cm from the nipple measuring 2.2 cm. The right axilla was sonographically benign.  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  Her subsequent history is detailed below   PAST MEDICAL HISTORY: Past Medical History:  Diagnosis Date   Breast cancer ([place]) 05/2016   right   Chronic constipation    Family history of cancer    Fibroid, uterine    IBS (irritable bowel syndrome)    IBS - C (constipation)   Personal history of chemotherapy    Personal history of radiation  therapy     PAST SURGICAL HISTORY: Past Surgical History:  Procedure Laterality Date   BARTHOLIN CYST MARSUPIALIZATION  09/04/2002   BARTHOLIN GLAND CYST EXCISION Left 07/13/2005   BREAST LUMPECTOMY  Right 06/07/2016   BREAST LUMPECTOMY WITH RADIOACTIVE SEED AND SENTINEL LYMPH NODE BIOPSY Right 06/07/2016   Procedure: BREAST LUMPECTOMY WITH RADIOACTIVE SEED AND SENTINEL LYMPH NODE BIOPSY;  Surgeon: Alphonsa Overall, MD;  Location: [facility];  Service: General;  Laterality: Right;   HYSTEROSCOPY  2016   MYOMECTOMY  07/20/2004   Laparotomy   OVARIAN CYST REMOVAL Right 07/20/2004   PORTACATH PLACEMENT N/A 07/05/2016   REMOVED Fall 2018 ; Procedure: INSERTION PORT-A-CATH WITH [place];  Surgeon: Alphonsa Overall, MD;  Location: [facility];  Service: General;  Laterality: N/A;   ROBOTIC ASSISTED TOTAL HYSTERECTOMY WITH BILATERAL SALPINGO OOPHERECTOMY Bilateral 01/04/2018   Procedure: XI ROBOTIC ASSISTED TOTAL HYSTERECTOMY WITH BILATERAL SALPINGO OOPHORECTOMY;  Surgeon: Isabel Caprice, MD;  Location: [facility];  Service: Gynecology;  Laterality: Bilateral;    FAMILY HISTORY Family History  Problem Relation Age of Onset   Breast cancer Mother 72   Pancreatic cancer Paternal Grandmother    Prostate cancer Paternal Grandfather    Ovarian cancer Other        MGMs sister   Breast cancer Other        mother's maternal first cousin  The patient's father, [name], lives in [place] and works as a Geneticist, molecular. The patient's mother is a Quarry manager. The patient has no brothers and 1 sister, who works in [place] as a Network engineer. The patient's mother had breast cancer, stage 0, diagnosed at age 81. There are 2 maternal relatives (mother's sister and mother's niece) with breast cancer, both postmenopausal.   GYNECOLOGIC HISTORY:  Menarche age 48, the patient is GX P0. Patient's last menstrual period was 06/18/2016.   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 She generally lives in [place] by herself although she is  currently staying at her parents' in [place].  Both her parents work, her mother in a nursing home and her father for the water department in [place].    ADVANCED DIRECTIVES: Not in place. The patient tells me she intends to name her sister as her healthcare power of attorney.    HEALTH MAINTENANCE: Social History   Tobacco Use   Smoking status: Never Smoker   Smokeless tobacco: Never Used  Substance Use Topics   Alcohol use: Yes    Alcohol/week: 1.0 standard drinks    Types: 1 Glasses of wine per week    Comment: once per month   Drug use: No     Colonoscopy: July 2016, in [place]  PAP:  Bone density: Never   Allergies  Allergen Reactions   Paclitaxel Itching   Amoxicillin Rash    Has patient had a PCN reaction causing immediate rash, facial/tongue/throat swelling, SOB or lightheadedness with hypotension: Yes Has patient had a PCN reaction causing severe rash involving mucus membranes or skin necrosis: Yes Has patient had a PCN reaction that required hospitalization: Yes Has patient had a PCN reaction occurring within the last 10 years: No If all of the above answers are "NO", then may proceed with Cephalosporin use.     Current Outpatient Medications  Medication Sig Dispense Refill   anastrozole (ARIMIDEX) 1 MG tablet Take 1 tablet (1  mg total) by mouth daily. 90 tablet 4   cholecalciferol (VITAMIN D3) 25 MCG (1000 UT) tablet Take 1 tablet (1,000 Units total) by mouth daily. 90 tablet 4   docusate sodium (COLACE) 100 MG capsule Take 1 capsule (100 mg total) by mouth every 12 (twelve) hours. 20 capsule 0   naproxen (NAPROSYN) 500 MG tablet Take 1,000 mg by mouth every 12 (twelve) hours as needed for pain.     omeprazole (PRILOSEC) 40 MG capsule Take 40 mg by mouth daily.     vitamin B-12 (CYANOCOBALAMIN) 100 MCG tablet Take 100 mcg by mouth daily.     No current facility-administered medications for this visit.    OBJECTIVE: young African-American  woman in no acute distress  Vitals:   06/03/19 1524  BP: 107/72  Pulse: 82  Resp: 18  Temp: 98 F (36.7 C)  SpO2: 100%     Body mass index is 29.61 kg/m.    ECOG FS: 1 - Symptomatic but completely ambulatory Filed Weights   06/03/19 1524  Weight: 175 lb 3.2 oz (79.5 kg)    Sclerae unicteric, EOMs intact Wearing a mask No cervical or supraclavicular adenopathy Lungs no rales or rhonchi Heart regular rate and rhythm Abd soft, nontender, positive bowel sounds MSK no focal spinal tenderness, no upper extremity lymphedema Neuro: nonfocal, well oriented, appropriate affect Breasts: The right breast is status post lumpectomy and radiation.  The cosmetic result is good.  There is no evidence of local recurrence.  The left breast is benign.  Both axillae are benign.   LAB RESULTS:  CMP     Component Value Date/Time   NA 143 06/03/2019 1500   NA 142 02/15/2017 1054   K 3.9 06/03/2019 1500   K 4.1 02/15/2017 1054   CL 105 06/03/2019 1500   CO2 29 06/03/2019 1500   CO2 25 02/15/2017 1054   GLUCOSE 99 06/03/2019 1500   GLUCOSE 71 02/15/2017 1054   BUN 10 06/03/2019 1500   BUN 12.3 02/15/2017 1054   CREATININE 0.84 06/03/2019 1500   CREATININE 0.9 02/15/2017 1054   CALCIUM 9.1 06/03/2019 1500   CALCIUM 9.6 02/15/2017 1054   PROT 7.1 06/03/2019 1500   PROT 7.0 02/15/2017 1054   ALBUMIN 4.1 06/03/2019 1500   ALBUMIN 3.8 02/15/2017 1054   AST 26 06/03/2019 1500   AST 25 02/15/2017 1054   ALT 17 06/03/2019 1500   ALT 15 02/15/2017 1054   ALKPHOS 113 06/03/2019 1500   ALKPHOS 62 02/15/2017 1054   BILITOT 0.2 (L) 06/03/2019 1500   BILITOT 0.60 02/15/2017 1054   GFRNONAA >60 06/03/2019 1500   GFRAA >60 06/03/2019 1500    INo results found for: SPEP, UPEP  Lab Results  Component Value Date   WBC 2.9 (L) 06/03/2019   NEUTROABS 0.6 (L) 06/03/2019   HGB 14.3 06/03/2019   HCT 41.2 06/03/2019   MCV 90.5 06/03/2019   PLT 225 06/03/2019      Chemistry      Component  Value Date/Time   NA 143 06/03/2019 1500   NA 142 02/15/2017 1054   K 3.9 06/03/2019 1500   K 4.1 02/15/2017 1054   CL 105 06/03/2019 1500   CO2 29 06/03/2019 1500   CO2 25 02/15/2017 1054   BUN 10 06/03/2019 1500   BUN 12.3 02/15/2017 1054   CREATININE 0.84 06/03/2019 1500   CREATININE 0.9 02/15/2017 1054      Component Value Date/Time   CALCIUM 9.1 06/03/2019 1500  CALCIUM 9.6 02/15/2017 1054   ALKPHOS 113 06/03/2019 1500   ALKPHOS 62 02/15/2017 1054   AST 26 06/03/2019 1500   AST 25 02/15/2017 1054   ALT 17 06/03/2019 1500   ALT 15 02/15/2017 1054   BILITOT 0.2 (L) 06/03/2019 1500   BILITOT 0.60 02/15/2017 1054     No results found for: LABCA2  No components found for: [id number]  No results for input(s): INR in the last 168 hours.  Urinalysis    Component Value Date/Time   COLORURINE STRAW (A) 04/20/2019 1911   APPEARANCEUR CLEAR 04/20/2019 1911   LABSPEC >1.046 (H) 04/20/2019 1911   PHURINE 6.0 04/20/2019 1911   GLUCOSEU NEGATIVE 04/20/2019 1911   HGBUR NEGATIVE 04/20/2019 1911   BILIRUBINUR NEGATIVE 04/20/2019 1911   KETONESUR NEGATIVE 04/20/2019 1911   PROTEINUR NEGATIVE 04/20/2019 1911   NITRITE NEGATIVE 04/20/2019 1911   LEUKOCYTESUR NEGATIVE 04/20/2019 1911    STUDIES: US BREAST LTD UNI LEFT INC AXILLA  Result Date: 05/07/2019 CLINICAL DATA:  44 year old female presenting for annual surveillance following left breast cancer in 2018 status post lumpectomy and radiation. EXAM: DIGITAL DIAGNOSTIC BILATERAL MAMMOGRAM WITH CAD ULTRASOUND LEFT BREAST COMPARISON:  Previous exam(s). ACR Breast Density Category c: The breast tissue is heterogeneously dense, which may obscure small masses. FINDINGS: Mammogram: Right breast: Spot magnification views were performed at the lumpectomy site in the superior and posterior right breast. There are stable postsurgical changes. No suspicious mass, distortion, or microcalcifications are identified to suggest presence of  malignancy. Left breast: In the lower outer left breast there is an oval circumscribed mass measuring approximately 1.2 cm. No additional findings identified in the left breast. Mammographic images were processed with CAD. Ultrasound: Targeted ultrasound is performed in the left breast at 4 o'clock 3 cm from the nipple demonstrating an oval circumscribed anechoic mass measuring 1.2 x 0.4 x 1.0 cm which appears to represent a focally dilated duct. No intraductal mass or debris identified. This likely corresponds to the mammographic finding. There are multiple other prominent ducts in the left retroareolar breast. IMPRESSION: 1.  Stable post lumpectomy changes in the right breast. 2. Focally dilated duct in the left breast without intraductal mass or debris. 3.  No mammographic evidence of malignancy in the bilateral breasts. RECOMMENDATION: Diagnostic bilateral mammogram in 1 year. I have discussed the findings and recommendations with the patient. If applicable, a reminder letter will be sent to the patient regarding the next appointment. BI-RADS CATEGORY  2: Benign. Electronically Signed   By: Audie Pinto M.D.   On: 05/07/2019 12:54   MM DIAG BREAST TOMO BILATERAL  Result Date: 05/07/2019 CLINICAL DATA:  44 year old female presenting for annual surveillance following left breast cancer in 2018 status post lumpectomy and radiation. EXAM: DIGITAL DIAGNOSTIC BILATERAL MAMMOGRAM WITH CAD ULTRASOUND LEFT BREAST COMPARISON:  Previous exam(s). ACR Breast Density Category c: The breast tissue is heterogeneously dense, which may obscure small masses. FINDINGS: Mammogram: Right breast: Spot magnification views were performed at the lumpectomy site in the superior and posterior right breast. There are stable postsurgical changes. No suspicious mass, distortion, or microcalcifications are identified to suggest presence of malignancy. Left breast: In the lower outer left breast there is an oval circumscribed mass  measuring approximately 1.2 cm. No additional findings identified in the left breast. Mammographic images were processed with CAD. Ultrasound: Targeted ultrasound is performed in the left breast at 4 o'clock 3 cm from the nipple demonstrating an oval circumscribed anechoic mass measuring 1.2 x 0.4 x 1.0  cm which appears to represent a focally dilated duct. No intraductal mass or debris identified. This likely corresponds to the mammographic finding. There are multiple other prominent ducts in the left retroareolar breast. IMPRESSION: 1.  Stable post lumpectomy changes in the right breast. 2. Focally dilated duct in the left breast without intraductal mass or debris. 3.  No mammographic evidence of malignancy in the bilateral breasts. RECOMMENDATION: Diagnostic bilateral mammogram in 1 year. I have discussed the findings and recommendations with the patient. If applicable, a reminder letter will be sent to the patient regarding the next appointment. BI-RADS CATEGORY  2: Benign. Electronically Signed   By: Audie Pinto M.D.   On: 05/07/2019 12:54    ELIGIBLE FOR AVAILABLE RESEARCH PROTOCOL: no  ASSESSMENT: 44 y.o. [place] woman currently residing in [place], status post right breast upper inner quadrant biopsy 04/21/2016 for a clinical T2 N0, stage IIa invasive ductal carcinoma, grade 2, estrogen and progesterone receptor positive, HER-2 nonamplified, with an MIB-1 of 20%  (a) biopsy of 2 additional suspicious areas in the right breast 05/12/2016 was benign  (1) started tamoxifen 04/27/2016, Discontinued at the start of chemotherapy  (2) Oncotype DX obtained from the original biopsy showed a score of 14, predicting a 10 year risk of recurrence outside the breast of 9% if the patient's only systemic therapy is tamoxifen for 5 years. It also predicts no benefit from chemotherapy.   (3) patient is not interested in fertility preservation  (4) genetics testing 04/27/2016 through the  [facility] gene panel offered by [facility] found no deleterious mutations in APC, ATM, BARD1, BMPR1A, BRCA1, BRCA2, BRIP1, CHD1, CDK4, CDKN2A, CHEK2, EPCAM (large rearrangement only), MLH1, MSH2, MSH6, MUTYH, NBN, PALB2, PMS2, PTEN, RAD51C, RAD51D, SMAD4, STK11, and TP53. Sequencing was performed for select regions of POLE and POLD1, and large rearrangement analysis was performed for select regions of GREM1.   (5) status post right lumpectomy with sentinel lymph node sampling 06/07/2016 for a pT1c pN1, stage IB invasive ductal carcinoma, grade 2, with negative margins   (6) Mammaprint sent from the final surgical sample was read as high risk, indicating a need for chemotherapy   (7) doxorubicin and cyclophosphamide in dose dense fashion 4  started 07/12/2016, completed 08/23/2016 followed by paclitaxel weekly 12 started 09/06/2016 (received 2 cycles), changed to Abraxane on 09/27/2016 due to reaction to Paclitaxel: Abraxane discontinued after 1 cycle because of the same reaction.  (8) adjuvant radiation 11/01/16-12/19/16 Site/dose:   1. Right Breast: 50 Gy in 25 fractions                          2. Right Breast Boost: 10 Gy in 5 fractions                          3. Right Breast SCV: 50 Gy in 25 fractions    (9) resumed tamoxifen 12/26/2016, discontinued January 2019 in preparation for anastrozole  (10) goserelin started 05/01/2017, stopped 12/13/2017  (11) status post laparoscopic hysterectomy with bilateral salpingo-oophorectomy 01/04/2018 with benign pathology  (12) anastrozole started 06/19/2017  (a) bone density 10/09/2018 is normal (T score of -0.9)   PLAN: Derin is now 3 years out from her definitive surgery with no evidence of disease recurrence.  This is very favorable.  She is tolerating anastrozole well and the plan will be to continue that a total of 5 years.  I am not sure what  the solution is going to be for her irritable bowel-like problems.  What I  suggested is that she take Colace 2 tablets every morning and metoclopramide 5 mg every morning and do that for about 10 days to see if she can become a bit more regular.  If she does not she will add MiraLAX also taken every day and try that for a couple of weeks.  If that all does not work she will let me know  She is applying for a job at E[employer] and is hoping to get it.  They had many questions regarding supplements and detoxification and we discussed that.  At this point I am delighted at how well she is doing.  She will see me again in 1 year.  She knows to call for any other issue that may develop before then.  Total encounter time 30 minutes.*  Nagi Furio, Virgie Dad, MD  06/03/19 4:05 PM Medical Oncology and Hematology [facility] [place], [place] [address] Tel. [phone]    Fax. [phone]    I, Wilburn Mylar, am acting as scribe for Dr. Virgie Dad. Dala Breault.  I, Lurline Del MD, have reviewed the above documentation for accuracy and completeness, and I agree with the above.   *Total Encounter Time as defined by the Centers for Medicare and Medicaid Services includes, in addition to the face-to-face time of a patient visit (documented in the note above) non-face-to-face time: obtaining and reviewing outside history, ordering and reviewing medications, tests or procedures, care coordination (communications with other health care professionals or caregivers) and documentation in the medical record.

## 2019-06-03 ENCOUNTER — Inpatient Hospital Stay: Payer: BC Managed Care – PPO

## 2019-06-03 ENCOUNTER — Other Ambulatory Visit: Payer: Self-pay

## 2019-06-03 ENCOUNTER — Inpatient Hospital Stay: Payer: BC Managed Care – PPO | Attending: Oncology | Admitting: Oncology

## 2019-06-03 VITALS — BP 107/72 | HR 82 | Temp 98.0°F | Resp 18 | Ht 64.5 in | Wt 175.2 lb

## 2019-06-03 DIAGNOSIS — Z170 Estrogen receptor positive status [ER+]: Secondary | ICD-10-CM

## 2019-06-03 DIAGNOSIS — C50211 Malignant neoplasm of upper-inner quadrant of right female breast: Secondary | ICD-10-CM

## 2019-06-03 DIAGNOSIS — Z9221 Personal history of antineoplastic chemotherapy: Secondary | ICD-10-CM | POA: Diagnosis not present

## 2019-06-03 DIAGNOSIS — Z79811 Long term (current) use of aromatase inhibitors: Secondary | ICD-10-CM | POA: Diagnosis not present

## 2019-06-03 DIAGNOSIS — Z923 Personal history of irradiation: Secondary | ICD-10-CM | POA: Insufficient documentation

## 2019-06-03 LAB — CMP (CANCER CENTER ONLY)
ALT: 17 U/L (ref 0–44)
AST: 26 U/L (ref 15–41)
Albumin: 4.1 g/dL (ref 3.5–5.0)
Alkaline Phosphatase: 113 U/L (ref 38–126)
Anion gap: 9 (ref 5–15)
BUN: 10 mg/dL (ref 6–20)
CO2: 29 mmol/L (ref 22–32)
Calcium: 9.1 mg/dL (ref 8.9–10.3)
Chloride: 105 mmol/L (ref 98–111)
Creatinine: 0.84 mg/dL (ref 0.44–1.00)
GFR, Est AFR Am: >60 mL/min (ref >60)
GFR, Estimated: >60 mL/min (ref >60)
Glucose, Bld: 99 mg/dL (ref 70–99)
Potassium: 3.9 mmol/L (ref 3.5–5.1)
Sodium: 143 mmol/L (ref 135–145)
Total Bilirubin: 0.2 mg/dL — ABNORMAL LOW (ref 0.3–1.2)
Total Protein: 7.1 g/dL (ref 6.5–8.1)

## 2019-06-03 LAB — CBC WITH DIFFERENTIAL (CANCER CENTER ONLY)
Abs Immature Granulocytes: 0 10*3/uL (ref 0.00–0.07)
Basophils Absolute: 0 10*3/uL (ref 0.0–0.1)
Basophils Relative: 1 %
Eosinophils Absolute: 0.1 10*3/uL (ref 0.0–0.5)
Eosinophils Relative: 4 %
HCT: 41.2 % (ref 36.0–46.0)
Hemoglobin: 14.3 g/dL (ref 12.0–15.0)
Immature Granulocytes: 0 %
Lymphocytes Relative: 61 %
Lymphs Abs: 1.8 10*3/uL (ref 0.7–4.0)
MCH: 31.4 pg (ref 26.0–34.0)
MCHC: 34.7 g/dL (ref 30.0–36.0)
MCV: 90.5 fL (ref 80.0–100.0)
Monocytes Absolute: 0.4 10*3/uL (ref 0.1–1.0)
Monocytes Relative: 15 %
Neutro Abs: 0.6 10*3/uL — ABNORMAL LOW (ref 1.7–7.7)
Neutrophils Relative %: 19 %
Platelet Count: 225 10*3/uL (ref 150–400)
RBC: 4.55 MIL/uL (ref 3.87–5.11)
RDW: 12 % (ref 11.5–15.5)
WBC Count: 2.9 10*3/uL — ABNORMAL LOW (ref 4.0–10.5)
nRBC: 0 % (ref 0.0–0.2)

## 2019-06-03 MED ORDER — METOCLOPRAMIDE HCL 5 MG PO TABS: 5.0000 mg | ORAL_TABLET | Freq: Every day | ORAL | 4 refills | Status: DC

## 2019-06-03 MED ORDER — DOCUSATE SODIUM 100 MG PO CAPS: 200.0000 mg | ORAL_CAPSULE | Freq: Every day | ORAL | 3 refills | Status: DC

## 2019-06-04 ENCOUNTER — Telehealth: Payer: Self-pay | Admitting: Oncology

## 2019-06-04 NOTE — Telephone Encounter (Signed)
Left pt a voicemail with new app detailst for appt scheduled on  06/02/2020. Mailed a reminder letter and calendar.

## 2019-07-04 ENCOUNTER — Other Ambulatory Visit: Payer: Self-pay

## 2019-07-04 ENCOUNTER — Ambulatory Visit: Payer: BC Managed Care – PPO | Attending: Family Medicine | Admitting: Physical Therapy

## 2019-07-04 ENCOUNTER — Encounter: Payer: Self-pay | Admitting: Physical Therapy

## 2019-07-04 DIAGNOSIS — M25561 Pain in right knee: Secondary | ICD-10-CM | POA: Diagnosis not present

## 2019-07-04 DIAGNOSIS — M25562 Pain in left knee: Secondary | ICD-10-CM | POA: Insufficient documentation

## 2019-07-04 DIAGNOSIS — M6281 Muscle weakness (generalized): Secondary | ICD-10-CM | POA: Diagnosis present

## 2019-07-04 DIAGNOSIS — G8929 Other chronic pain: Secondary | ICD-10-CM | POA: Insufficient documentation

## 2019-07-04 DIAGNOSIS — M62838 Other muscle spasm: Secondary | ICD-10-CM | POA: Insufficient documentation

## 2019-07-05 NOTE — Therapy (Addendum)
[facility] [address] [place], [place], [address] Phone: [phone]   Fax:  [phone]  Physical Therapy Evaluation  Patient Details  Name: Candice [name] MRN: [id number] Date of Birth: 1975/07/09 Referring Provider (PT): Jonathon Jordan, [place]   Encounter Date: 07/04/2019  PT End of Session - 07/05/19 2010    Visit Number  1    Date for PT Re-Evaluation  08/29/19    PT Start Time  [id number]    PT Stop Time  [id number]    PT Time Calculation (min)  39 min    Activity Tolerance  Patient tolerated treatment well    Behavior During Therapy  [facility] for tasks assessed/performed       Past Medical History:  Diagnosis Date  . Breast cancer ([place]) 05/2016   right  . Chronic constipation   . Family history of cancer   . Fibroid, uterine   . IBS (irritable bowel syndrome)    IBS - C (constipation)  . Personal history of chemotherapy   . Personal history of radiation therapy     Past Surgical History:  Procedure Laterality Date  . BARTHOLIN CYST MARSUPIALIZATION  09/04/2002  . BARTHOLIN GLAND CYST EXCISION Left 07/13/2005  . BREAST LUMPECTOMY Right 06/07/2016  . BREAST LUMPECTOMY WITH RADIOACTIVE SEED AND SENTINEL LYMPH NODE BIOPSY Right 06/07/2016   Procedure: BREAST LUMPECTOMY WITH RADIOACTIVE SEED AND SENTINEL LYMPH NODE BIOPSY;  Surgeon: Alphonsa Overall, MD;  Location: [place];  Service: General;  Laterality: Right;  . HYSTEROSCOPY  2016  . MYOMECTOMY  07/20/2004   Laparotomy  . OVARIAN CYST REMOVAL Right 07/20/2004  . PORTACATH PLACEMENT N/A 07/05/2016   REMOVED Fall 2018 ; Procedure: INSERTION PORT-A-CATH WITH [place];  Surgeon: Alphonsa Overall, MD;  Location: [place];  Service: General;  Laterality: N/A;  . ROBOTIC ASSISTED TOTAL HYSTERECTOMY WITH BILATERAL SALPINGO OOPHERECTOMY Bilateral 01/04/2018   Procedure: XI ROBOTIC ASSISTED TOTAL HYSTERECTOMY WITH BILATERAL SALPINGO OOPHORECTOMY;   Surgeon: Isabel Caprice, MD;  [facility];  Service: Gynecology;  Laterality: Bilateral;    There were no vitals filed for this visit.   Subjective Assessment - 07/04/19 1538    Subjective  I felt better after coming in here last year, but I stopped doing the exercises and now the knees started hurting again. I just have to sit down and rest when they hurt, I have some pain with stairs, not all the time.    Pertinent History  name pronounced Tha-see- ya (also goes by "T")    How long can you stand comfortably?  2 hours; dancing <1 hour    How long can you walk comfortably?  1 hour    Patient Stated Goals  be able to dance at a club or concert and stand for the whole    Currently in Pain?  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, unspecified knee    Referring Provider (PT)  Jonathon Jordan, M    Onset Date/Surgical Date  --   2 years ago   Prior Therapy  Yes, 2 years ago      Precautions   Precautions  None      Restrictions   Weight Bearing Restrictions  No      Balance Screen   Has the patient fallen in the past 6 months  No      [place] residence    Living Arrangements  Parent      Prior Function   Level of Independence  Independent      Cognition   Overall Cognitive Status  Within Functional Limits for tasks assessed      Posture/Postural Control   Posture/Postural Control   Postural limitations    Postural Limitations  Forward head;Decreased thoracic kyphosis;Anterior pelvic tilt      ROM / Strength   AROM / PROM / Strength  Strength      Strength   Overall Strength Comments  mostly 5/5 except below    Strength Assessment Site  Hip;Knee    Right/Left Hip  Right;Left    Right Hip External Rotation   4+/5    Right Hip ABduction  4+/5    Left Hip External Rotation  4+/5    Left Hip ABduction  4+/5      Flexibility   Soft Tissue Assessment /Muscle Length  yes    Hamstrings  WFL    Quadriceps  WFL      Palpation   Palpation comment  tight IT band      Special Tests   Other special tests  single leg stand x 10 sec slight Trendelenburg; squat and sit to stand unremarkable      Ambulation/Gait   Gait Pattern  Within Functional Limits                Objective measurements completed on examination: See above findings.                   PT Long Term Goals - 07/05/19 2005      PT LONG TERM GOAL #1   Title  Pt will have increase in BLE strength to 5/5 MMT which will allow for safe return to gym program on her own after d/c    Baseline  4+/5 bilat hip abduction and ER    Time  8    Period  Weeks    Status  New    Target Date  08/29/19      PT LONG TERM GOAL #2   Title  Pt will be able to complete single leg mini squat without UE support and with minimal knee valgus deviation x10 reps which will reflect improvements in LE strength and neuromusular control    Time  8    Period  Weeks    Status  New    Target Date  08/29/19      PT LONG TERM GOAL #3   Title  Pt will be independent in a home exercise program for stretching and strengthening bilateral knees    Baseline  minimal knowledge from previous PT over one year ago    Time  8  Period  Weeks    Status  New      PT LONG TERM GOAL #4   Title  Pt will report atleast 50% improvement in her pain with daily activity and have good understanding of safe exercise  progressions and activity for the gym    Time  8    Period  Weeks    Status  New    Target Date  08/29/19      PT LONG TERM GOAL #5   Title  Pt will be able to walk and stand for at least 3 hours at a time for social activities and household chores.    Time  8    Period  Weeks    Status  New    Target Date  08/29/19             Plan - 07/05/19 2013    Clinical Impression Statement  Pt presents to clinic today due to bilateral knee pain from chondromalacia.  Pt has been seen over one year ago for this issue and was much better after previous round of PT.  Pt has had to deal with other health issues related to healing after treatment for breast cancer and has not been able to maintain her home program.  She is now feeling like she can focus on her strength again and would like to be more active so she is here to make sure she is progressing her exercises correctly so that she does not do further damage.  Today's assessment is significant for some mild Trendelenburg with single leg standing.  She demonstrates good control of her knee with stairs but her issues arise more with activities lasting greater than 30 minutes.  Pt has mild hip weakness.  She also demosntrates anterior pelvic tilt adding to valgus stress on her knees.  She will benefit from skilled PT to guide her to safe and effective strengthening program so she can return to activities for more healthy and higher quality of life.    Personal Factors and Comorbidities  Comorbidity 2    Comorbidities  hx of hysterectomy, estrogen positive breast cancer; chronic and progressive condition    Examination-Activity Limitations  Stand;Stairs    Examination-Participation Restrictions  Community Activity    Stability/Clinical Decision Making  Stable/Uncomplicated    Clinical Decision Making  Low    Rehab Potential  Excellent    PT Frequency  2x / week    PT Duration  8 weeks    PT Treatment/Interventions  ADLs/Self Care Home  Management;Cryotherapy;Electrical Stimulation;Moist Heat;Iontophoresis 4mg /ml Dexamethasone;Neuromuscular re-education;Therapeutic exercise;Therapeutic activities;Stair training;Gait training;Patient/family education;Manual techniques;Dry needling;Passive range of motion;Taping    PT Next Visit Plan  hip and knee stretches; starting at moderate hip and knee strengthening and education on hip and knee alignment so she will know when she is fatigued to take a break    PT Home Exercise Plan  still has exercises from previous PT and will start exercises she is comfortable with (using green band)    Consulted and Agree with Plan of Care  Patient       Patient will benefit from skilled therapeutic intervention in order to improve the following deficits and impairments:  Pain, Decreased strength, Postural dysfunction, Increased muscle spasms, Increased fascial restricitons  Visit Diagnosis: Chronic pain of right knee - Plan: PT plan of care cert/re-cert  Chronic pain of left knee - Plan: PT plan of care cert/re-cert  Muscle weakness (generalized) - Plan: PT plan of care cert/re-cert  Other muscle spasm - Plan: PT plan of care cert/re-cert     Problem List Patient Active Problem List   Diagnosis Date Noted  . Fibroid uterus 01/04/2018  . Fibroid, uterine 08/21/2017  . Port catheter in place 09/06/2016  . Genetic testing 05/09/2016  . Malignant neoplasm of upper-inner quadrant of right breast in female, estrogen receptor positive ([place]) 04/27/2016  . Family history of breast cancer   . Family history of ovarian cancer   . Family history of prostate cancer   . Family history of pancreatic cancer     Jule Ser, PT 07/05/2019, 9:21 PM  [facility] [address], [place], [place], [address] Phone: [phone]   Fax:  [phone]  Name: Candice [name] MRN: [id number] Date of Birth: 02/15/1976

## 2019-07-10 ENCOUNTER — Other Ambulatory Visit: Payer: Self-pay

## 2019-07-10 ENCOUNTER — Ambulatory Visit: Payer: BC Managed Care – PPO

## 2019-07-10 DIAGNOSIS — M6281 Muscle weakness (generalized): Secondary | ICD-10-CM

## 2019-07-10 DIAGNOSIS — M25562 Pain in left knee: Secondary | ICD-10-CM

## 2019-07-10 DIAGNOSIS — M25561 Pain in right knee: Secondary | ICD-10-CM | POA: Diagnosis not present

## 2019-07-10 DIAGNOSIS — G8929 Other chronic pain: Secondary | ICD-10-CM

## 2019-07-10 DIAGNOSIS — M62838 Other muscle spasm: Secondary | ICD-10-CM

## 2019-07-10 NOTE — Therapy (Signed)
[facility] [address] [place], [place], [address] Phone: [phone]   Fax:  [phone]  Physical Therapy Treatment  Patient Details  Name: Candice [name] MRN: [id number] Date of Birth: July 23, 1975 Referring Provider (PT): Jonathon Jordan, [place]   Encounter Date: 07/10/2019  PT End of Session - 07/10/19 1657    Visit Number  2    Date for PT Re-Evaluation  08/29/19    PT Start Time  [id number]    PT Stop Time  1658    PT Time Calculation (min)  42 min    Activity Tolerance  Patient tolerated treatment well    Behavior During Therapy  [facility] for tasks assessed/performed       Past Medical History:  Diagnosis Date  . Breast cancer ([place]) 05/2016   right  . Chronic constipation   . Family history of cancer   . Fibroid, uterine   . IBS (irritable bowel syndrome)    IBS - C (constipation)  . Personal history of chemotherapy   . Personal history of radiation therapy     Past Surgical History:  Procedure Laterality Date  . BARTHOLIN CYST MARSUPIALIZATION  09/04/2002  . BARTHOLIN GLAND CYST EXCISION Left 07/13/2005  . BREAST LUMPECTOMY Right 06/07/2016  . BREAST LUMPECTOMY WITH RADIOACTIVE SEED AND SENTINEL LYMPH NODE BIOPSY Right 06/07/2016   Procedure: BREAST LUMPECTOMY WITH RADIOACTIVE SEED AND SENTINEL LYMPH NODE BIOPSY;  Surgeon: Alphonsa Overall, MD;  Location: [place];  Service: General;  Laterality: Right;  . HYSTEROSCOPY  2016  . MYOMECTOMY  07/20/2004   Laparotomy  . OVARIAN CYST REMOVAL Right 07/20/2004  . PORTACATH PLACEMENT N/A 07/05/2016   REMOVED Fall 2018 ; Procedure: INSERTION PORT-A-CATH WITH [place];  Surgeon: Alphonsa Overall, MD;  Location: [place];  Service: General;  Laterality: N/A;  . ROBOTIC ASSISTED TOTAL HYSTERECTOMY WITH BILATERAL SALPINGO OOPHERECTOMY Bilateral 01/04/2018   Procedure: XI ROBOTIC ASSISTED TOTAL HYSTERECTOMY WITH BILATERAL SALPINGO OOPHORECTOMY;   Surgeon: Isabel Caprice, MD;  [facility];  Service: Gynecology;  Laterality: Bilateral;    There were no vitals filed for this visit.  Subjective Assessment - 07/10/19 1622    Subjective  My knee pain is up and down.  I know I need to do exercises for my knees.    Pertinent History  name pronounced Tha-see- ya (also goes by "T")    Currently in Pain?  No/denies                       [facility] Adult PT Treatment/Exercise - 07/10/19 0001      Exercises   Exercises  Knee/Hip;Lumbar      Lumbar Exercises: Stretches   Active Hamstring Stretch  3 reps;20 seconds;Left;Right    Piriformis Stretch  Left;Right;3 reps;20 seconds      Lumbar Exercises: Aerobic   Nustep  Level 1x 8 minutes    PT present to discuss progress     Knee/Hip Exercises: Standing   Forward Step Up  Both;2 sets;10 reps;Step Height: 6"      Knee/Hip Exercises: Seated   Sit to Sand  2 sets;10 reps;without UE support   modified from single leg due to reduced control     Knee/Hip Exercises: Sidelying   Clams  2x10 with green band   tactile and verbal cues required to reduce substitution            PT Education - 07/10/19 1642  Education Details  Access Code: [phone]    Person(s) Educated  Patient    Methods  Explanation;Demonstration    Comprehension  Verbalized understanding;Returned demonstration          PT Long Term Goals - 07/05/19 2005      PT LONG TERM GOAL #1   Title  Pt will have increase in BLE strength to 5/5 MMT which will allow for safe return to gym program on her own after d/c    Baseline  4+/5 bilat hip abduction and ER    Time  8    Period  Weeks    Status  New    Target Date  08/29/19      PT LONG TERM GOAL #2   Title  Pt will be able to complete single leg mini squat without UE support and with minimal knee valgus deviation x10 reps which will reflect improvements in LE strength and neuromusular control    Time  8    Period  Weeks    Status  New     Target Date  08/29/19      PT LONG TERM GOAL #3   Title  Pt will be independent in a home exercise program for stretching and strengthening bilateral knees    Baseline  minimal knowledge from previous PT over one year ago    Time  8    Period  Weeks    Status  New      PT LONG TERM GOAL #4   Title  Pt will report atleast 50% improvement in her pain with daily activity and have good understanding of safe exercise progressions and activity for the gym    Time  8    Period  Weeks    Status  New    Target Date  08/29/19      PT LONG TERM GOAL #5   Title  Pt will be able to walk and stand for at least 3 hours at a time for social activities and household chores.    Time  8    Period  Weeks    Status  New    Target Date  08/29/19            Plan - 07/10/19 1638    Clinical Impression Statement  Pt with first time follow-up after evaluation.  Pt has been going to the gym and doing leg press and other weights without any pain or limitation.  PT provided review of HEP issued the last time pt was here for knee pain.  PT educated pt on not overdoing it with exercise.  Pt required minor verbal and tactile cues for technique.  Pt didn't have enough eccentric control with single leg squats so PT modified to bil squats and pt was able to demonstrate more control with this.  Pt will continue to benefit from skilled PT to address bil knee pain.    Comorbidities  hx of hysterectomy, estrogen positive breast cancer; chronic and progressive condition    PT Frequency  2x / week    PT Duration  8 weeks    PT Treatment/Interventions  ADLs/Self Care Home Management;Cryotherapy;Electrical Stimulation;Moist Heat;Iontophoresis 4mg /ml Dexamethasone;Neuromuscular re-education;Therapeutic exercise;Therapeutic activities;Stair training;Gait training;Patient/family education;Manual techniques;Dry needling;Passive range of motion;Taping    PT Next Visit Plan  bil hip and knee strength, stability and flexibility     PT Home Exercise Plan  Access Code: [email or web address]    Consulted and Agree with Plan of Care  Patient  Patient will benefit from skilled therapeutic intervention in order to improve the following deficits and impairments:  Pain, Decreased strength, Postural dysfunction, Increased muscle spasms, Increased fascial restricitons  Visit Diagnosis: Chronic pain of right knee  Chronic pain of left knee  Muscle weakness (generalized)  Other muscle spasm     Problem List Patient Active Problem List   Diagnosis Date Noted  . Fibroid uterus 01/04/2018  . Fibroid, uterine 08/21/2017  . Port catheter in place 09/06/2016  . Genetic testing 05/09/2016  . Malignant neoplasm of upper-inner quadrant of right breast in female, estrogen receptor positive ([place]) 04/27/2016  . Family history of breast cancer   . Family history of ovarian cancer   . Family history of prostate cancer   . Family history of pancreatic cancer      Sigurd Sos, PT 07/10/19 5:05 PM  [facility] [address], [place], [place], [address] Phone: [phone]   Fax:  [phone]  Name: Ellissa Manfull MRN: [id number] Date of Birth: [birth year]

## 2019-07-10 NOTE — Patient Instructions (Signed)
Access Code: [email or web address] URL: [email or web address] Date: 07/10/2019 Prepared by: Claiborne Billings  Exercises  Sit to Stand without Arm Support - 2 x daily - 7 x weekly - 2 sets - 10 reps Supine Bridge with Resistance Band - 1 x daily - 7 x weekly - 10 reps - 3 sets

## 2019-07-15 ENCOUNTER — Other Ambulatory Visit: Payer: Self-pay | Admitting: Oncology

## 2019-07-15 MED ORDER — GABAPENTIN 300 MG PO CAPS: 300.0000 mg | ORAL_CAPSULE | Freq: Every day | ORAL | 4 refills | Status: DC

## 2019-07-17 ENCOUNTER — Other Ambulatory Visit: Payer: Self-pay

## 2019-07-17 ENCOUNTER — Ambulatory Visit: Payer: BC Managed Care – PPO

## 2019-07-17 DIAGNOSIS — M62838 Other muscle spasm: Secondary | ICD-10-CM

## 2019-07-17 DIAGNOSIS — M25562 Pain in left knee: Secondary | ICD-10-CM

## 2019-07-17 DIAGNOSIS — M25561 Pain in right knee: Secondary | ICD-10-CM | POA: Diagnosis not present

## 2019-07-17 DIAGNOSIS — G8929 Other chronic pain: Secondary | ICD-10-CM

## 2019-07-17 DIAGNOSIS — M6281 Muscle weakness (generalized): Secondary | ICD-10-CM

## 2019-07-17 NOTE — Therapy (Signed)
[facility] [address], [place], [place], [address] Phone: [phone]   Fax:  [phone]  Physical Therapy Treatment  Patient Details  Name: Candice [name] MRN: [id number] Date of Birth: 1976-02-18 Referring Provider (PT): Jonathon Jordan, [place]   Encounter Date: 07/17/2019  PT End of Session - 07/17/19 1655    Visit Number  3    Date for PT Re-Evaluation  08/29/19    Authorization Type  Medicaid    Authorization - Visit Number  2    Authorization - Number of Visits  3    PT Start Time  [id number]    PT Stop Time  1657    PT Time Calculation (min)  40 min    Activity Tolerance  Patient tolerated treatment well    Behavior During Therapy  [facility] for tasks assessed/performed       Past Medical History:  Diagnosis Date  . Breast cancer ([place]) 05/2016   right  . Chronic constipation   . Family history of cancer   . Fibroid, uterine   . IBS (irritable bowel syndrome)    IBS - C (constipation)  . Personal history of chemotherapy   . Personal history of radiation therapy     Past Surgical History:  Procedure Laterality Date  . BARTHOLIN CYST MARSUPIALIZATION  09/04/2002  . BARTHOLIN GLAND CYST EXCISION Left 07/13/2005  . BREAST LUMPECTOMY Right 06/07/2016  . BREAST LUMPECTOMY WITH RADIOACTIVE SEED AND SENTINEL LYMPH NODE BIOPSY Right 06/07/2016   Procedure: BREAST LUMPECTOMY WITH RADIOACTIVE SEED AND SENTINEL LYMPH NODE BIOPSY;  Surgeon: Alphonsa Overall, MD;  Location: [place];  Service: General;  Laterality: Right;  . HYSTEROSCOPY  2016  . MYOMECTOMY  07/20/2004   Laparotomy  . OVARIAN CYST REMOVAL Right 07/20/2004  . PORTACATH PLACEMENT N/A 07/05/2016   REMOVED Fall 2018 ; Procedure: INSERTION PORT-A-CATH WITH [place];  Surgeon: Alphonsa Overall, MD;  Location: [place];  Service: General;  Laterality: N/A;  . ROBOTIC ASSISTED TOTAL HYSTERECTOMY WITH BILATERAL SALPINGO OOPHERECTOMY  Bilateral 01/04/2018   Procedure: XI ROBOTIC ASSISTED TOTAL HYSTERECTOMY WITH BILATERAL SALPINGO OOPHORECTOMY;  Surgeon: Isabel Caprice, MD;  [facility];  Service: Gynecology;  Laterality: Bilateral;    There were no vitals filed for this visit.  Subjective Assessment - 07/17/19 1620    Subjective  I am doing my exercises.  I feel pain sometimes with long periods of standing and walking.    Pertinent History  name pronounced Candice [name] (also goes by "Candice [name]")    Currently in Pain?  No/denies   up to 6/10 pain                      [facility] Adult PT Treatment/Exercise - 07/17/19 0001      Lumbar Exercises: Aerobic   Nustep  Level 2 x 8 minutes    PT present to discuss progress     Knee/Hip Exercises: Machines for Strengthening   Cybex Leg Press  seat 5: both 110# 2x10, 70# 2x10      Knee/Hip Exercises: Standing   Forward Step Up  Both;2 sets;10 reps;Step Height: 6"    Step Down  Both;2 sets;10 reps    SLS  Rt and Lt x 3  with focus on alignment      Knee/Hip Exercises: Seated   Sit to Sand  2 sets;10 reps;without UE support  PT Education - 07/17/19 1649    Education Details  Access Code: [email or web address]    Person(s) Educated  Patient    Methods  Explanation;Demonstration;Handout    Comprehension  Verbalized understanding;Returned demonstration          PT Long Term Goals - 07/17/19 1621      PT LONG TERM GOAL #4   Title  Pt will report atleast 50% improvement in her pain with daily activity and have good understanding of safe exercise progressions and activity for the gym    Baseline  improved control of the muscles      PT LONG TERM GOAL #5   Title  Pt will be able to walk and stand for at least 3 hours at a time for social activities and household chores.    Baseline  limited to 1 hour    Time  8    Period  Weeks    Status  On-going            Plan - 07/17/19 1635    Clinical Impression Statement  Pt is more consistent with HEP  for LE strength.  Pt reports improved strength/control of muscles in her legs since the start of care.  Pt with improved single leg stability with exercise today and PT provided verbal cues for alignment and to improve activation of glute med.  Pt is limited to walking 1 hour at this time.  Pt will continue to benefit from skilled PT to improve bil LE strength, stability and flexibility.    PT Treatment/Interventions  ADLs/Self Care Home Management;Cryotherapy;Electrical Stimulation;Moist Heat;Iontophoresis 4mg /ml Dexamethasone;Neuromuscular re-education;Therapeutic exercise;Therapeutic activities;Stair training;Gait training;Patient/family education;Manual techniques;Dry needling;Passive range of motion;Taping    PT Next Visit Plan  bil hip and knee strength, stability and flexibility.  ERO next and request Medicaid    PT Home Exercise Plan  Access Code: [phone]    Recommended Other Services  initial cert is signed    Consulted and Agree with Plan of Care  Patient       Patient will benefit from skilled therapeutic intervention in order to improve the following deficits and impairments:  Pain, Decreased strength, Postural dysfunction, Increased muscle spasms, Increased fascial restricitons  Visit Diagnosis: Chronic pain of left knee  Chronic pain of right knee  Muscle weakness (generalized)  Other muscle spasm     Problem List Patient Active Problem List   Diagnosis Date Noted  . Fibroid uterus 01/04/2018  . Fibroid, uterine 08/21/2017  . Port catheter in place 09/06/2016  . Genetic testing 05/09/2016  . Malignant neoplasm of upper-inner quadrant of right breast in female, estrogen receptor positive ([place]) 04/27/2016  . Family history of breast cancer   . Family history of ovarian cancer   . Family history of prostate cancer   . Family history of pancreatic cancer     Sigurd Sos, PT 07/17/19 4:57 PM  [facility] [address], [place], [place], [address] Phone: [phone]   Fax:  [phone]  Name: Candice [name] MRN: [id number] Date of Birth: [birth year]

## 2019-07-17 NOTE — Patient Instructions (Signed)
Access Code: [email or web address] URL: [email or web address] Date: 07/17/2019 Prepared by: Claiborne Billings  Exercises   Single Leg Stance - 2 x daily - 7 x weekly - 1 sets - 3 reps - 20-30 hold Forward Step Down - 2 x daily - 7 x weekly - 2 sets - 10 reps

## 2019-07-24 ENCOUNTER — Ambulatory Visit: Payer: BC Managed Care – PPO

## 2019-07-24 ENCOUNTER — Other Ambulatory Visit: Payer: Self-pay

## 2019-07-24 DIAGNOSIS — G8929 Other chronic pain: Secondary | ICD-10-CM

## 2019-07-24 DIAGNOSIS — M25561 Pain in right knee: Secondary | ICD-10-CM | POA: Diagnosis not present

## 2019-07-24 DIAGNOSIS — M6281 Muscle weakness (generalized): Secondary | ICD-10-CM

## 2019-07-24 DIAGNOSIS — M62838 Other muscle spasm: Secondary | ICD-10-CM

## 2019-07-24 DIAGNOSIS — M25562 Pain in left knee: Secondary | ICD-10-CM

## 2019-07-24 NOTE — Therapy (Signed)
[facility] [address], [place], [place], [address] Phone: [phone]   Fax:  [phone]  Physical Therapy Treatment  Patient Details  Name: Candice [name] MRN: [id number] Date of Birth: Oct 26, 1975 Referring Provider (PT): Jonathon Jordan, [place]   Encounter Date: 07/24/2019  PT End of Session - 07/24/19 1659    Visit Number  4    Date for PT Re-Evaluation  08/29/19    Authorization Type  Medicaid    Authorization - Visit Number  3    Authorization - Number of Visits  3    PT Start Time  [id number]    PT Stop Time  1658    PT Time Calculation (min)  43 min    Activity Tolerance  Patient tolerated treatment well    Behavior During Therapy  [facility] for tasks assessed/performed       Past Medical History:  Diagnosis Date  . Breast cancer ([place]) 05/2016   right  . Chronic constipation   . Family history of cancer   . Fibroid, uterine   . IBS (irritable bowel syndrome)    IBS - C (constipation)  . Personal history of chemotherapy   . Personal history of radiation therapy     Past Surgical History:  Procedure Laterality Date  . BARTHOLIN CYST MARSUPIALIZATION  09/04/2002  . BARTHOLIN GLAND CYST EXCISION Left 07/13/2005  . BREAST LUMPECTOMY Right 06/07/2016  . BREAST LUMPECTOMY WITH RADIOACTIVE SEED AND SENTINEL LYMPH NODE BIOPSY Right 06/07/2016   Procedure: BREAST LUMPECTOMY WITH RADIOACTIVE SEED AND SENTINEL LYMPH NODE BIOPSY;  Surgeon: Alphonsa Overall, MD;  Location: [place];  Service: General;  Laterality: Right;  . HYSTEROSCOPY  2016  . MYOMECTOMY  07/20/2004   Laparotomy  . OVARIAN CYST REMOVAL Right 07/20/2004  . PORTACATH PLACEMENT N/A 07/05/2016   REMOVED Fall 2018 ; Procedure: INSERTION PORT-A-CATH WITH [place];  Surgeon: Alphonsa Overall, MD;  Location: [place];  Service: General;  Laterality: N/A;  . ROBOTIC ASSISTED TOTAL HYSTERECTOMY WITH BILATERAL SALPINGO OOPHERECTOMY  Bilateral 01/04/2018   Procedure: XI ROBOTIC ASSISTED TOTAL HYSTERECTOMY WITH BILATERAL SALPINGO OOPHORECTOMY;  Surgeon: Isabel Caprice, MD;  [facility];  Service: Gynecology;  Laterality: Bilateral;    There were no vitals filed for this visit.  Subjective Assessment - 07/24/19 1621    Subjective  I am feeling like I am getting stronger.  I feel soreness.    Currently in Pain?  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, unspecified knee      Strength   Right Hip External Rotation   4+/5    Right Hip ABduction  5/5    Left Hip External Rotation  4+/5    Left Hip ABduction  2+/5    Left Hip ADduction  5/5  [facility] Adult PT Treatment/Exercise - 07/24/19 0001      Lumbar Exercises: Aerobic   Nustep  Level 2 x 8 minutes    PT present to discuss progress     Knee/Hip Exercises: Standing   SLS  Rt and Lt x 3  with focus on alignment    Walking with Sports Cord  30# 4 ways with mini squat    Other Standing Knee Exercises  single leg squat with modification to improve alignment x10 bil- use of mirror for visual feetback.      Other Standing Knee Exercises  sidestepping with green band 4x25 feet.                  PT Long Term Goals - 07/24/19 1622      PT LONG TERM GOAL #1   Title  Pt will have increase in BLE strength to 5/5 MMT which will allow for safe return to gym program on her own after d/c    Baseline  4+/5 bilat hip abduction and ER    Time  8    Period  Weeks    Status  On-going      PT LONG TERM  GOAL #2   Title  Pt will be able to complete single leg mini squat without UE support and with minimal knee valgus deviation x10 reps which will reflect improvements in LE strength and neuromusular control    Baseline  not able to do on single limb, excessive valgus.  Able to do with modified technique with 25% weight bearing into opposite LE    Time  8    Period  Weeks    Status  On-going      PT LONG TERM GOAL #3   Title  Pt will be independent in a home exercise program for stretching and strengthening bilateral knees    Baseline  further advancement needed as pt gets stronger    Time  8    Period  Weeks    Status  On-going      PT LONG TERM GOAL #4   Title  Pt will report atleast 50% improvement in her pain with daily activity and have good understanding of safe exercise progressions and activity for the gym    Baseline  60%    Status  Achieved      PT LONG TERM GOAL #5   Title  Pt will be able to walk and stand for at least 3 hours at a time for social activities and household chores.    Baseline  limited to 1 to 1.5 hours    Time  8    Period  Weeks    Status  On-going            Plan - 07/24/19 1652    Clinical Impression Statement  Pt reports improved strength/control of muscles in her legs since the start of care and a 60% reduction in pain and overall fatigue.  Pt with improved single leg stability with exercise today and PT provided verbal cues for alignment and to improve activation of glute med.  Pt remains challenged with single leg squat and demonstrates excessive valgus with trials on the Rt and Lt.  Pt was able to demonstrate with mild valgus with modified technique. Pt is limited to walking and standing 1-1.5 hours at this time due to pain and fatigue.  Pt will continue to benefit from skilled PT to improve bil LE strength, stability and flexibility.  PT Frequency  2x / week    PT Duration  8 weeks    PT Treatment/Interventions  ADLs/Self Care Home  Management;Cryotherapy;Electrical Stimulation;Moist Heat;Iontophoresis 4mg /ml Dexamethasone;Neuromuscular re-education;Therapeutic exercise;Therapeutic activities;Stair training;Gait training;Patient/family education;Manual techniques;Dry needling;Passive range of motion;Taping    PT Next Visit Plan  bil hip and knee strength, stability and flexibility.  Continue to work on hip and knee stability, strength and alignment.    PT Home Exercise Plan  Access Code: [email or web address]    Consulted and Agree with Plan of Care  Patient       Patient will benefit from skilled therapeutic intervention in order to improve the following deficits and impairments:  Pain, Decreased strength, Postural dysfunction, Increased muscle spasms, Increased fascial restricitons  Visit Diagnosis: Chronic pain of left knee  Chronic pain of right knee  Muscle weakness (generalized)  Other muscle spasm     Problem List Patient Active Problem List   Diagnosis Date Noted  . Fibroid uterus 01/04/2018  . Fibroid, uterine 08/21/2017  . Port catheter in place 09/06/2016  . Genetic testing 05/09/2016  . Malignant neoplasm of upper-inner quadrant of right breast in female, estrogen receptor positive ([place]) 04/27/2016  . Family history of breast cancer   . Family history of ovarian cancer   . Family history of prostate cancer   . Family history of pancreatic cancer      Sigurd Sos, PT 07/24/19 5:07 PM  [facility] [address] [place], [place], [address] Phone: [phone]   Fax:  [phone]  Name: Candice [name] MRN: [id number] Date of Birth: January 05, 1976

## 2019-08-05 ENCOUNTER — Other Ambulatory Visit: Payer: Self-pay | Admitting: Podiatry

## 2019-08-05 ENCOUNTER — Ambulatory Visit (INDEPENDENT_AMBULATORY_CARE_PROVIDER_SITE_OTHER): Payer: BC Managed Care – PPO | Admitting: Podiatry

## 2019-08-05 ENCOUNTER — Encounter: Payer: Self-pay | Admitting: Podiatry

## 2019-08-05 ENCOUNTER — Ambulatory Visit (INDEPENDENT_AMBULATORY_CARE_PROVIDER_SITE_OTHER): Payer: BC Managed Care – PPO

## 2019-08-05 ENCOUNTER — Other Ambulatory Visit: Payer: Self-pay

## 2019-08-05 VITALS — BP 111/66 | HR 66 | Temp 97.8°F | Resp 16

## 2019-08-05 DIAGNOSIS — S92514S Nondisplaced fracture of proximal phalanx of right lesser toe(s), sequela: Secondary | ICD-10-CM

## 2019-08-05 DIAGNOSIS — M79671 Pain in right foot: Secondary | ICD-10-CM

## 2019-08-05 DIAGNOSIS — M779 Enthesopathy, unspecified: Secondary | ICD-10-CM

## 2019-08-08 ENCOUNTER — Ambulatory Visit: Payer: BC Managed Care – PPO | Admitting: Physical Therapy

## 2019-08-13 ENCOUNTER — Ambulatory Visit: Payer: BC Managed Care – PPO | Attending: Family Medicine | Admitting: Physical Therapy

## 2019-08-13 ENCOUNTER — Encounter: Payer: Self-pay | Admitting: Physical Therapy

## 2019-08-13 ENCOUNTER — Other Ambulatory Visit: Payer: Self-pay

## 2019-08-13 DIAGNOSIS — M62838 Other muscle spasm: Secondary | ICD-10-CM

## 2019-08-13 DIAGNOSIS — M25561 Pain in right knee: Secondary | ICD-10-CM | POA: Diagnosis present

## 2019-08-13 DIAGNOSIS — G8929 Other chronic pain: Secondary | ICD-10-CM | POA: Diagnosis present

## 2019-08-13 DIAGNOSIS — R293 Abnormal posture: Secondary | ICD-10-CM | POA: Insufficient documentation

## 2019-08-13 DIAGNOSIS — M25562 Pain in left knee: Secondary | ICD-10-CM | POA: Diagnosis not present

## 2019-08-13 DIAGNOSIS — M6281 Muscle weakness (generalized): Secondary | ICD-10-CM | POA: Diagnosis present

## 2019-08-13 NOTE — Therapy (Signed)
[facility] [address], [place], [place], [address] Phone: [phone]   Fax:  [phone]  Physical Therapy Treatment  Patient Details  Name: Candice [name] MRN: [id number] Date of Birth: 1975/12/26 Referring Provider (PT): Jonathon Jordan, [place]   Encounter Date: 08/13/2019  PT End of Session - 08/13/19 1501    Visit Number  5    Date for PT Re-Evaluation  08/29/19    Authorization Type  Medicaid    Authorization - Visit Number  1    Authorization - Number of Visits  12    PT Start Time  [id number]    PT Stop Time  1528    PT Time Calculation (min)  42 min    Activity Tolerance  Patient tolerated treatment well    Behavior During Therapy  [facility] for tasks assessed/performed       Past Medical History:  Diagnosis Date  . Breast cancer ([place]) 05/2016   right  . Chronic constipation   . Family history of cancer   . Fibroid, uterine   . IBS (irritable bowel syndrome)    IBS - C (constipation)  . Personal history of chemotherapy   . Personal history of radiation therapy     Past Surgical History:  Procedure Laterality Date  . BARTHOLIN CYST MARSUPIALIZATION  09/04/2002  . BARTHOLIN GLAND CYST EXCISION Left 07/13/2005  . BREAST LUMPECTOMY Right 06/07/2016  . BREAST LUMPECTOMY WITH RADIOACTIVE SEED AND SENTINEL LYMPH NODE BIOPSY Right 06/07/2016   Procedure: BREAST LUMPECTOMY WITH RADIOACTIVE SEED AND SENTINEL LYMPH NODE BIOPSY;  Surgeon: Alphonsa Overall, MD;  Location: [place];  Service: General;  Laterality: Right;  . HYSTEROSCOPY  2016  . MYOMECTOMY  07/20/2004   Laparotomy  . OVARIAN CYST REMOVAL Right 07/20/2004  . PORTACATH PLACEMENT N/A 07/05/2016   REMOVED Fall 2018 ; Procedure: INSERTION PORT-A-CATH WITH [place];  Surgeon: Alphonsa Overall, MD;  Location: [place];  Service: General;  Laterality: N/A;  . ROBOTIC ASSISTED TOTAL HYSTERECTOMY WITH BILATERAL SALPINGO OOPHERECTOMY  Bilateral 01/04/2018   Procedure: XI ROBOTIC ASSISTED TOTAL HYSTERECTOMY WITH BILATERAL SALPINGO OOPHORECTOMY;  Surgeon: Isabel Caprice, MD;  [facility];  Service: Gynecology;  Laterality: Bilateral;    There were no vitals filed for this visit.  Subjective Assessment - 08/13/19 1450    Subjective  Pt states that she is back walking. She was able to go for an hour with her mom but as she reaches the hour, her knees are a little achy. She is also back at the gym now.    Currently in Pain?  No/denies                        [facility] Adult PT Treatment/Exercise - 08/13/19 0001      Knee/Hip Exercises: Aerobic   Elliptical  L1 x5 min PT present to discuss activity/HEP      Knee/Hip Exercises: Standing   Walking with Sports Cord  #30 backwards/sidestepping each way x5 reps each     Other Standing Knee Exercises  lateral step down 4" 2x10 reps visual/tactile cues for improved knee valgus and trendelenburg     Other Standing Knee Exercises  wall sits just above 90 deg: 3x30 sec       Knee/Hip Exercises: Supine   Other Supine Knee/Hip Exercises  glute bridge off mat table 3x10 holding #25 plate  PT Education - 08/13/19 1527    Education Details  technique with therex    Person(s) Educated  Patient    Methods  Explanation;Verbal cues;Tactile cues    Comprehension  Verbalized understanding;Need further instruction          PT Long Term Goals - 07/24/19 1622      PT LONG TERM GOAL #1   Title  Pt will have increase in BLE strength to 5/5 MMT which will allow for safe return to gym program on her own after d/c    Baseline  4+/5 bilat hip abduction and ER    Time  8    Period  Weeks    Status  On-going      PT LONG TERM GOAL #2   Title  Pt will be able to complete single leg mini squat without UE support and with minimal knee valgus deviation x10 reps which will reflect improvements in LE strength and neuromusular control    Baseline  not able  to do on single limb, excessive valgus.  Able to do with modified technique with 25% weight bearing into opposite LE    Time  8    Period  Weeks    Status  On-going      PT LONG TERM GOAL #3   Title  Pt will be independent in a home exercise program for stretching and strengthening bilateral knees    Baseline  further advancement needed as pt gets stronger    Time  8    Period  Weeks    Status  On-going      PT LONG TERM GOAL #4   Title  Pt will report atleast 50% improvement in her pain with daily activity and have good understanding of safe exercise progressions and activity for the gym    Baseline  60%    Status  Achieved      PT LONG TERM GOAL #5   Title  Pt will be able to walk and stand for at least 3 hours at a time for social activities and household chores.    Baseline  limited to 1 to 1.5 hours    Time  8    Period  Weeks    Status  On-going            Plan - 08/13/19 1528    Clinical Impression Statement  Pt is progressing her activity at home. She has returned to the gym and is walking up to an hour several days during the week. Pt remains highly motivated during her sessions and requires close PT monitoring to ensure she maintains proper technique. Pt had the most difficulty with lateral step downs, demonstrating trendeleburg and knee valgus deviation. This was improved with tactile and visual feedback. Pt denied increase in knee pain following today's session.    PT Frequency  2x / week    PT Duration  8 weeks    PT Treatment/Interventions  ADLs/Self Care Home Management;Cryotherapy;Electrical Stimulation;Moist Heat;Iontophoresis 4mg /ml Dexamethasone;Neuromuscular re-education;Therapeutic exercise;Therapeutic activities;Stair training;Gait training;Patient/family education;Manual techniques;Dry needling;Passive range of motion;Taping    PT Next Visit Plan  bil hip and knee strength, stability and flexibility. Review lateral step down and technique; Continue to work  on hip and knee stability, strength and alignment.    PT Home Exercise Plan  Access Code: [email or web address]    Consulted and Agree with Plan of Care  Patient       Patient will benefit from skilled therapeutic intervention in order to  improve the following deficits and impairments:  Pain, Decreased strength, Postural dysfunction, Increased muscle spasms, Increased fascial restricitons  Visit Diagnosis: Chronic pain of left knee  Chronic pain of right knee  Muscle weakness (generalized)  Other muscle spasm     Problem List Patient Active Problem List   Diagnosis Date Noted  . Globus pharyngeus 09/17/2018  . Laryngopharyngeal reflux (LPR) 09/17/2018  . Fibroid uterus 01/04/2018  . Fibroid, uterine 08/21/2017  . Other constipation 09/29/2016  . Port catheter in place 09/06/2016  . Genetic testing 05/09/2016  . Malignant neoplasm of upper-inner quadrant of right breast in female, estrogen receptor positive ([place]) 04/27/2016  . Family history of breast cancer   . Family history of ovarian cancer   . Family history of prostate cancer   . Family history of pancreatic cancer     3:32 PM,08/13/19 Sherol Dade PT, DPT [facility] [address] [place], [place], [address] Phone: [phone]   Fax:  [phone]  Name: Candice [name] MRN: [id number] Date of Birth: 1975-06-21

## 2019-08-14 NOTE — Progress Notes (Signed)
Subjective:   Patient ID: Candice [name], female   DOB: 44 y.o.   MRN: [id number]   HPI 44 year old female presents the office with concerns of pain to the right foot, pointing to the MPJ from the second third fourth.  She states that she previously had a fracture of the fourth toe which is in the boot has been immobilized.  She has not been as active but recently she is contact her activity she started for discomfort.  She is able to walk and do activities with some mild soreness.  She said no recent injury.  No recent treatment.   Review of Systems  All other systems reviewed and are negative.  Past Medical History:  Diagnosis Date  . Breast cancer ([place]) 05/2016   right  . Chronic constipation   . Family history of cancer   . Fibroid, uterine   . IBS (irritable bowel syndrome)    IBS - C (constipation)  . Personal history of chemotherapy   . Personal history of radiation therapy     Past Surgical History:  Procedure Laterality Date  . BARTHOLIN CYST MARSUPIALIZATION  09/04/2002  . BARTHOLIN GLAND CYST EXCISION Left 07/13/2005  . BREAST LUMPECTOMY Right 06/07/2016  . BREAST LUMPECTOMY WITH RADIOACTIVE SEED AND SENTINEL LYMPH NODE BIOPSY Right 06/07/2016   Procedure: BREAST LUMPECTOMY WITH RADIOACTIVE SEED AND SENTINEL LYMPH NODE BIOPSY;  Surgeon: Alphonsa Overall, MD;  Location: [place];  Service: General;  Laterality: Right;  . HYSTEROSCOPY  2016  . MYOMECTOMY  07/20/2004   Laparotomy  . OVARIAN CYST REMOVAL Right 07/20/2004  . PORTACATH PLACEMENT N/A 07/05/2016   REMOVED Fall 2018 ; Procedure: INSERTION PORT-A-CATH WITH [place];  Surgeon: Alphonsa Overall, MD;  Location: [place];  Service: General;  Laterality: N/A;  . ROBOTIC ASSISTED TOTAL HYSTERECTOMY WITH BILATERAL SALPINGO OOPHERECTOMY Bilateral 01/04/2018   Procedure: XI ROBOTIC ASSISTED TOTAL HYSTERECTOMY WITH BILATERAL SALPINGO OOPHORECTOMY;  Surgeon: Isabel Caprice, MD;  [facility];  Service: Gynecology;  Laterality: Bilateral;     Current Outpatient Medications:  .  anastrozole (ARIMIDEX) 1 MG tablet, Take 1 tablet (1 mg total) by mouth daily., Disp: 90 tablet, Rfl: 4 .  cholecalciferol (VITAMIN D3) 25 MCG (1000 UT) tablet, Take 1 tablet (1,000 Units total) by mouth daily., Disp: 90 tablet, Rfl: 4 .  docusate sodium (COLACE) 100 MG capsule, Take 1 capsule (100 mg total) by mouth every 12 (twelve) hours., Disp: 20 capsule, Rfl: 0 .  docusate sodium (COLACE) 100 MG capsule, Take 2 capsules (200 mg total) by mouth daily with breakfast., Disp: 60 capsule, Rfl: 3 .  etodolac (LODINE) 400 MG tablet, Take 400 mg by mouth 2 (two) times daily., Disp: , Rfl:  .  gabapentin (NEURONTIN) 300 MG capsule, Take 1 capsule (300 mg total) by mouth at bedtime., Disp: 90 capsule, Rfl: 4 .  metoCLOPramide (REGLAN) 5 MG tablet, Take 1 tablet (5 mg total) by mouth daily with breakfast., Disp: 90 tablet, Rfl: 4 .  naproxen (NAPROSYN) 500 MG tablet, Take 1,000 mg by mouth every 12 (twelve) hours as needed for pain., Disp: , Rfl:  .  omeprazole (PRILOSEC) 40 MG capsule, Take 40 mg by mouth daily., Disp: , Rfl:  .  sertraline (ZOLOFT) 50 MG tablet, TAKE 1/2 TABLET BY MOUTH ONCE A DAY FOR 7 DAYS THEN 1 TABLET ONCE A DAY, Disp: , Rfl:  .  tiZANidine (ZANAFLEX) 2 MG tablet, Take 2 mg by mouth 3 (three) times  daily., Disp: , Rfl:  .  vitamin B-12 (CYANOCOBALAMIN) 100 MCG tablet, Take 100 mcg by mouth daily., Disp: , Rfl:   Allergies  Allergen Reactions  . Paclitaxel Itching  . Amoxicillin Rash    Has patient had a PCN reaction causing immediate rash, facial/tongue/throat swelling, SOB or lightheadedness with hypotension: Yes Has patient had a PCN reaction causing severe rash involving mucus membranes or skin necrosis: Yes Has patient had a PCN reaction that required hospitalization: Yes Has patient had a PCN reaction occurring within the last 10 years: No If all of the above answers are "NO",  then may proceed with Cephalosporin use.          Objective:  Physical Exam  General: AAO x3, NAD  Dermatological: Skin is warm, dry and supple bilateral. Nails x 10 are well manicured; remaining integument appears unremarkable at this time. There are no open sores, no preulcerative lesions, no rash or signs of infection present.  Vascular: Dorsalis Pedis artery and Posterior Tibial artery pedal pulses are 2/4 bilateral with immedate capillary fill time.There is no pain with calf compression, swelling, warmth, erythema.   Neruologic: Grossly intact via light touch bilateral.   Musculoskeletal: There is no pain to the fourth toe of the lesser digits himself.  Mild discomfort on the second, third, fourth MPJ present.  Pinpoint tenderness.  Continue current erythema.  There is no pain or crepitation or restriction of range of motion.  Flexor, extensor tendons appear intact.  No other areas of discomfort.  Gait: Unassisted, Nonantalgic.       Assessment:   Capsulitis right foot MPJs     Plan:  -Treatment options discussed including all alternatives, risks, and complications -Etiology of symptoms were discussed -X-rays were obtained and reviewed with the patient.  There is a healing fracture to the fourth toe.  There is no other evidence of acute fracture. -I think her symptoms are due to result of overuse, compensation.  We discussed changing shoes and orthotic support.  Offloading pads can metatarsal pads.  She is anti-inflammatories as needed.  Return if symptoms worsen or fail to improve.  Trula Slade DPM

## 2019-08-15 ENCOUNTER — Encounter: Payer: BC Managed Care – PPO | Admitting: Physical Therapy

## 2019-08-19 ENCOUNTER — Other Ambulatory Visit: Payer: Self-pay | Admitting: *Deleted

## 2019-08-19 MED ORDER — GABAPENTIN 300 MG PO CAPS: 300.0000 mg | ORAL_CAPSULE | Freq: Every day | ORAL | 4 refills | Status: DC

## 2019-08-20 ENCOUNTER — Ambulatory Visit: Payer: BC Managed Care – PPO | Admitting: Physical Therapy

## 2019-08-20 ENCOUNTER — Other Ambulatory Visit: Payer: Self-pay

## 2019-08-20 ENCOUNTER — Encounter: Payer: Self-pay | Admitting: Physical Therapy

## 2019-08-20 DIAGNOSIS — M25562 Pain in left knee: Secondary | ICD-10-CM | POA: Diagnosis not present

## 2019-08-20 DIAGNOSIS — G8929 Other chronic pain: Secondary | ICD-10-CM

## 2019-08-20 DIAGNOSIS — M6281 Muscle weakness (generalized): Secondary | ICD-10-CM

## 2019-08-20 DIAGNOSIS — R293 Abnormal posture: Secondary | ICD-10-CM

## 2019-08-20 DIAGNOSIS — M62838 Other muscle spasm: Secondary | ICD-10-CM

## 2019-08-20 NOTE — Patient Instructions (Signed)
Access Code: [email or web address] URL: [email or web address] Date: 08/20/2019 Prepared by: Jari Favre  Exercises Standing Hip Extension with Resistance - 1 x daily - 15 reps - 2 sets Quadruped Rocking Backward - 1 x daily - 15 reps Clamshell with Resistance - 1 x daily - 15 reps - 2-3 sets Seated Piriformis Stretch - 1 x daily - 3 reps - 20 hold Half Kneeling Hip Flexor Stretch with Chair - 1 x daily - 3 reps - 20 hold Seated Hip Internal Rotation AROM - 1 x daily - 15 reps Side Stepping with Resistance at Feet - 1 x daily - 15 reps - 2 sets Seated Hip External Rotation AROM - 1 x daily - 15 reps Supine Posterior Pelvic Tilt - 1 x daily - 7 x weekly - 10 reps - 2 sets - 3 hold Sidelying Hip Abduction at Wall - 1 x daily - 15 reps - 3 sets Supine 90/90 Alternating Heel Touches with Posterior Pelvic Tilt - 1 x daily - 7 x weekly - 10 reps - 2 sets - 3 hold Sit to Stand without Arm Support - 2 x daily - 7 x weekly - 2 sets - 10 reps Supine Bridge with Resistance Band - 1 x daily - 7 x weekly - 10 reps - 3 sets Single Leg Stance - 2 x daily - 7 x weekly - 1 sets - 3 reps - 20-30 hold Forward Step Down - 2 x daily - 7 x weekly - 2 sets - 10 reps Single Leg Sit to Stand with Arms Extended - 1 x daily - 7 x weekly - 3 sets - 10 reps

## 2019-08-20 NOTE — Therapy (Signed)
[facility] [address], [place], [place], [address] Phone: [phone]   Fax:  [phone]  Physical Therapy Treatment  Patient Details  Name: Candice [name] MRN: [id number] Date of Birth: 1975/06/10 Referring Provider (PT): Jonathon Jordan, [place]   Encounter Date: 08/20/2019  PT End of Session - 08/20/19 1409    Visit Number  6    Date for PT Re-Evaluation  08/29/19    Authorization Type  Medicaid    Authorization - Visit Number  2    Authorization - Number of Visits  12    PT Start Time  [id number]   arrived late   PT Stop Time  1445    PT Time Calculation (min)  38 min    Activity Tolerance  Patient tolerated treatment well    Behavior During Therapy  [facility] for tasks assessed/performed       Past Medical History:  Diagnosis Date  . Breast cancer ([place]) 05/2016   right  . Chronic constipation   . Family history of cancer   . Fibroid, uterine   . IBS (irritable bowel syndrome)    IBS - C (constipation)  . Personal history of chemotherapy   . Personal history of radiation therapy     Past Surgical History:  Procedure Laterality Date  . BARTHOLIN CYST MARSUPIALIZATION  09/04/2002  . BARTHOLIN GLAND CYST EXCISION Left 07/13/2005  . BREAST LUMPECTOMY Right 06/07/2016  . BREAST LUMPECTOMY WITH RADIOACTIVE SEED AND SENTINEL LYMPH NODE BIOPSY Right 06/07/2016   Procedure: BREAST LUMPECTOMY WITH RADIOACTIVE SEED AND SENTINEL LYMPH NODE BIOPSY;  Surgeon: Alphonsa Overall, MD;  Location: [place];  Service: General;  Laterality: Right;  . HYSTEROSCOPY  2016  . MYOMECTOMY  07/20/2004   Laparotomy  . OVARIAN CYST REMOVAL Right 07/20/2004  . PORTACATH PLACEMENT N/A 07/05/2016   REMOVED Fall 2018 ; Procedure: INSERTION PORT-A-CATH WITH [place];  Surgeon: Alphonsa Overall, MD;  Location: [place];  Service: General;  Laterality: N/A;  . ROBOTIC ASSISTED TOTAL HYSTERECTOMY WITH BILATERAL SALPINGO  OOPHERECTOMY Bilateral 01/04/2018   Procedure: XI ROBOTIC ASSISTED TOTAL HYSTERECTOMY WITH BILATERAL SALPINGO OOPHORECTOMY;  Surgeon: Isabel Caprice, MD;  [facility];  Service: Gynecology;  Laterality: Bilateral;    There were no vitals filed for this visit.  Subjective Assessment - 08/20/19 1410    Subjective  I started going back to the gym. I do the leg press and extension and hamstring curls and do steps.    Patient Stated Goals  be able to dance at a club or concert and stand for the whole    Currently in Pain?  No/denies                        [facility] Adult PT Treatment/Exercise - 08/20/19 0001      Knee/Hip Exercises: Aerobic   Elliptical  L4 x5 min PT present to discuss activity/HEP      Knee/Hip Exercises: Standing   Forward Lunges  Right;Left;15 reps    Forward Lunges Limitations  on sliders - did 5 reps with yellow band for abduction    Functional Squat  20 reps   yellow band around knees   Walking with Sports Cord  #30 backwards/sidestepping each way x5 reps each     Other Standing Knee Exercises  fwd step down - has IR hip moment  Lt>RT    Other Standing Knee Exercises  single leg stand to  sit - work on [employer], museum/gallery             PT Education - 08/20/19 1445    Education Details  Access Code: [email or web address]    Person(s) Educated  Patient    Methods  Explanation;Demonstration;Verbal cues;Handout    Comprehension  Verbalized understanding;Returned demonstration          PT Long Term Goals - 07/24/19 1622      PT LONG TERM GOAL #1   Title  Pt will have increase in BLE strength to 5/5 MMT which will allow for safe return to gym program on her own after d/c    Baseline  4+/5 bilat hip abduction and ER    Time  8    Period  Weeks    Status  On-going      PT LONG TERM GOAL #2   Title  Pt will be able to complete single leg mini squat without UE support and with minimal knee valgus deviation x10 reps which will reflect improvements in LE  strength and neuromusular control    Baseline  not able to do on single limb, excessive valgus.  Able to do with modified technique with 25% weight bearing into opposite LE    Time  8    Period  Weeks    Status  On-going      PT LONG TERM GOAL #3   Title  Pt will be independent in a home exercise program for stretching and strengthening bilateral knees    Baseline  further advancement needed as pt gets stronger    Time  8    Period  Weeks    Status  On-going      PT LONG TERM GOAL #4   Title  Pt will report atleast 50% improvement in her pain with daily activity and have good understanding of safe exercise progressions and activity for the gym    Baseline  60%    Status  Achieved      PT LONG TERM GOAL #5   Title  Pt will be able to walk and stand for at least 3 hours at a time for social activities and household chores.    Baseline  limited to 1 to 1.5 hours    Time  8    Period  Weeks    Status  On-going            Plan - 08/20/19 1530    Clinical Impression Statement  Pt is progressing and back to the gym now.  Pt has not had pain recently.  She requires monitoring from PT throughout session due to knee valgus and internal hip rotation Lt>Rt.  Pt did well with cues from resistance and with modification of exercises.  No increased pain after todays treatment.    PT Treatment/Interventions  ADLs/Self Care Home Management;Cryotherapy;Electrical Stimulation;Moist Heat;Iontophoresis 4mg /ml Dexamethasone;Neuromuscular re-education;Therapeutic exercise;Therapeutic activities;Stair training;Gait training;Patient/family education;Manual techniques;Dry needling;Passive range of motion;Taping    PT Next Visit Plan  bil hip and knee strength, stability and flexibility. Review lateral step down and technique; Continue to work on hip and knee stability, strength and alignment.    PT Home Exercise Plan  Access Code: [email or web address]    Consulted and Agree with Plan of Care  Patient       Patient  will benefit from skilled therapeutic intervention in order to improve the following deficits and impairments:  Pain, Decreased strength, Postural dysfunction, Increased muscle spasms, Increased fascial restricitons  Visit Diagnosis: Chronic pain  of left knee  Chronic pain of right knee  Muscle weakness (generalized)  Other muscle spasm  Abnormal posture     Problem List Patient Active Problem List   Diagnosis Date Noted  . Globus pharyngeus 09/17/2018  . Laryngopharyngeal reflux (LPR) 09/17/2018  . Fibroid uterus 01/04/2018  . Fibroid, uterine 08/21/2017  . Other constipation 09/29/2016  . Port catheter in place 09/06/2016  . Genetic testing 05/09/2016  . Malignant neoplasm of upper-inner quadrant of right breast in female, estrogen receptor positive ([place]) 04/27/2016  . Family history of breast cancer   . Family history of ovarian cancer   . Family history of prostate cancer   . Family history of pancreatic cancer     Jule Ser, PT 08/20/2019, 3:32 PM  [facility] [address], [place], [place], [address] Phone: [phone]   Fax:  [phone]  Name: Deana Wiegel MRN: [id number] Date of Birth: 07-12-1975

## 2019-08-22 ENCOUNTER — Ambulatory Visit: Payer: BC Managed Care – PPO | Admitting: Physician Assistant

## 2019-08-22 ENCOUNTER — Encounter: Payer: BC Managed Care – PPO | Admitting: Physical Therapy

## 2019-08-27 ENCOUNTER — Other Ambulatory Visit: Payer: Self-pay

## 2019-08-27 ENCOUNTER — Encounter: Payer: Self-pay | Admitting: Physical Therapy

## 2019-08-27 ENCOUNTER — Ambulatory Visit: Payer: BC Managed Care – PPO | Attending: Family Medicine | Admitting: Physical Therapy

## 2019-08-27 DIAGNOSIS — M25561 Pain in right knee: Secondary | ICD-10-CM | POA: Insufficient documentation

## 2019-08-27 DIAGNOSIS — M6281 Muscle weakness (generalized): Secondary | ICD-10-CM | POA: Diagnosis present

## 2019-08-27 DIAGNOSIS — G8929 Other chronic pain: Secondary | ICD-10-CM | POA: Diagnosis present

## 2019-08-27 DIAGNOSIS — M25562 Pain in left knee: Secondary | ICD-10-CM | POA: Diagnosis not present

## 2019-08-27 DIAGNOSIS — M62838 Other muscle spasm: Secondary | ICD-10-CM | POA: Diagnosis present

## 2019-08-27 DIAGNOSIS — R293 Abnormal posture: Secondary | ICD-10-CM

## 2019-08-27 NOTE — Therapy (Signed)
[facility] [address], [place], [place], [address] Phone: [phone]   Fax:  [phone]  Physical Therapy Treatment  Patient Details  Name: Candice [name] MRN: [id number] Date of Birth: Dec 20, 1975 Referring Provider (PT): Jonathon Jordan, [place]   Encounter Date: 08/27/2019  PT End of Session - 08/27/19 1538    Visit Number  7    Date for PT Re-Evaluation  08/29/19    Authorization Type  Medicaid    Authorization - Visit Number  3    Authorization - Number of Visits  12    PT Start Time  [id number]    PT Stop Time  1533    PT Time Calculation (min)  44 min    Activity Tolerance  Patient tolerated treatment well    Behavior During Therapy  [facility] for tasks assessed/performed       Past Medical History:  Diagnosis Date  . Breast cancer ([place]) 05/2016   right  . Chronic constipation   . Family history of cancer   . Fibroid, uterine   . IBS (irritable bowel syndrome)    IBS - C (constipation)  . Personal history of chemotherapy   . Personal history of radiation therapy     Past Surgical History:  Procedure Laterality Date  . BARTHOLIN CYST MARSUPIALIZATION  09/04/2002  . BARTHOLIN GLAND CYST EXCISION Left 07/13/2005  . BREAST LUMPECTOMY Right 06/07/2016  . BREAST LUMPECTOMY WITH RADIOACTIVE SEED AND SENTINEL LYMPH NODE BIOPSY Right 06/07/2016   Procedure: BREAST LUMPECTOMY WITH RADIOACTIVE SEED AND SENTINEL LYMPH NODE BIOPSY;  Surgeon: Alphonsa Overall, MD;  Location: [place];  Service: General;  Laterality: Right;  . HYSTEROSCOPY  2016  . MYOMECTOMY  07/20/2004   Laparotomy  . OVARIAN CYST REMOVAL Right 07/20/2004  . PORTACATH PLACEMENT N/A 07/05/2016   REMOVED Fall 2018 ; Procedure: INSERTION PORT-A-CATH WITH [place];  Surgeon: Alphonsa Overall, MD;  Location: [place];  Service: General;  Laterality: N/A;  . ROBOTIC ASSISTED TOTAL HYSTERECTOMY WITH BILATERAL SALPINGO OOPHERECTOMY  Bilateral 01/04/2018   Procedure: XI ROBOTIC ASSISTED TOTAL HYSTERECTOMY WITH BILATERAL SALPINGO OOPHORECTOMY;  Surgeon: Isabel Caprice, MD;  [facility];  Service: Gynecology;  Laterality: Bilateral;    There were no vitals filed for this visit.  Subjective Assessment - 08/27/19 1603    Subjective  Pt states she is working on it but still has hard time keeping knee from wobbling    Currently in Pain?  No/denies                        [facility] Adult PT Treatment/Exercise - 08/27/19 0001      Neuro Re-ed    Neuro Re-ed Details   cues for knee and pelvis alignemnt throughout      Lumbar Exercises: Aerobic   Nustep  L3 x 8 minutes      Lumbar Exercises: Seated   Sit to Stand  10 reps    Sit to Stand Limitations  single leg with min assist from other leg    Other Seated Lumbar Exercises  hip IR/ER 20x each side      Knee/Hip Exercises: Standing   Other Standing Knee Exercises  hip hinge with dowel and without dowel; single and double leg - 10x                  PT Long Term Goals - 07/24/19 1622  PT LONG TERM GOAL #1   Title  Pt will have increase in BLE strength to 5/5 MMT which will allow for safe return to gym program on her own after d/c    Baseline  4+/5 bilat hip abduction and ER    Time  8    Period  Weeks    Status  On-going      PT LONG TERM GOAL #2   Title  Pt will be able to complete single leg mini squat without UE support and with minimal knee valgus deviation x10 reps which will reflect improvements in LE strength and neuromusular control    Baseline  not able to do on single limb, excessive valgus.  Able to do with modified technique with 25% weight bearing into opposite LE    Time  8    Period  Weeks    Status  On-going      PT LONG TERM GOAL #3   Title  Pt will be independent in a home exercise program for stretching and strengthening bilateral knees    Baseline  further advancement needed as pt gets stronger    Time  8     Period  Weeks    Status  On-going      PT LONG TERM GOAL #4   Title  Pt will report atleast 50% improvement in her pain with daily activity and have good understanding of safe exercise progressions and activity for the gym    Baseline  60%    Status  Achieved      PT LONG TERM GOAL #5   Title  Pt will be able to walk and stand for at least 3 hours at a time for social activities and household chores.    Baseline  limited to 1 to 1.5 hours    Time  8    Period  Weeks    Status  On-going            Plan - 08/27/19 1541    Clinical Impression Statement  Pt continues to progress with strength building exercises.  She has a lot of LE strength in larger muscle groups that are overpower hip rotators.  She will continue to benefit from skilled PT to work on strengthening smaller stability muscles and to reinforce new mototo patters.  Pt needed cues in order to keep pelvic position and knee alignment neutral throughout session.  Modifications given as needed in order to maintain alignment.  Able to do double and single leg variations in standing and with functional movments.    Comorbidities  hx of hysterectomy, estrogen positive breast cancer; chronic and progressive condition    PT Treatment/Interventions  ADLs/Self Care Home Management;Cryotherapy;Electrical Stimulation;Moist Heat;Iontophoresis 4mg /ml Dexamethasone;Neuromuscular re-education;Therapeutic exercise;Therapeutic activities;Stair training;Gait training;Patient/family education;Manual techniques;Dry needling;Passive range of motion;Taping    PT Next Visit Plan  bil hip and knee strength, stability and flexibility. Review lateral step down and technique; Continue to work on hip and knee stability, strength and alignment.    PT Home Exercise Plan  Access Code: [email or web address]    Consulted and Agree with Plan of Care  Patient       Patient will benefit from skilled therapeutic intervention in order to improve the following deficits and  impairments:  Pain, Decreased strength, Postural dysfunction, Increased muscle spasms, Increased fascial restricitons  Visit Diagnosis: Chronic pain of left knee  Chronic pain of right knee  Muscle weakness (generalized)  Other muscle spasm  Abnormal posture  Problem List Patient Active Problem List   Diagnosis Date Noted  . Globus pharyngeus 09/17/2018  . Laryngopharyngeal reflux (LPR) 09/17/2018  . Fibroid uterus 01/04/2018  . Fibroid, uterine 08/21/2017  . Other constipation 09/29/2016  . Port catheter in place 09/06/2016  . Genetic testing 05/09/2016  . Malignant neoplasm of upper-inner quadrant of right breast in female, estrogen receptor positive ([place]) 04/27/2016  . Family history of breast cancer   . Family history of ovarian cancer   . Family history of prostate cancer   . Family history of pancreatic cancer     Jule Ser, PT 08/27/2019, 4:04 PM  [facility] [address] [place], [place], [address] Phone: [phone]   Fax:  [phone]  Name: Abbigayle Naragon MRN: [id number] Date of Birth: 04-Oct-1975

## 2019-08-29 ENCOUNTER — Ambulatory Visit: Payer: BC Managed Care – PPO | Admitting: Physical Therapy

## 2019-09-02 ENCOUNTER — Ambulatory Visit: Payer: BC Managed Care – PPO | Admitting: Podiatry

## 2019-09-03 ENCOUNTER — Ambulatory Visit: Payer: BC Managed Care – PPO | Admitting: Physical Therapy

## 2019-09-05 ENCOUNTER — Encounter: Payer: BC Managed Care – PPO | Admitting: Physical Therapy

## 2019-09-10 ENCOUNTER — Other Ambulatory Visit: Payer: Self-pay

## 2019-09-10 ENCOUNTER — Ambulatory Visit: Payer: BC Managed Care – PPO | Admitting: Physical Therapy

## 2019-09-10 ENCOUNTER — Encounter: Payer: Self-pay | Admitting: Physical Therapy

## 2019-09-10 DIAGNOSIS — M25562 Pain in left knee: Secondary | ICD-10-CM

## 2019-09-10 DIAGNOSIS — M62838 Other muscle spasm: Secondary | ICD-10-CM

## 2019-09-10 DIAGNOSIS — M6281 Muscle weakness (generalized): Secondary | ICD-10-CM

## 2019-09-10 DIAGNOSIS — R293 Abnormal posture: Secondary | ICD-10-CM

## 2019-09-10 DIAGNOSIS — G8929 Other chronic pain: Secondary | ICD-10-CM

## 2019-09-11 NOTE — Therapy (Addendum)
[facility] [address] [place], [place], [address] Phone: [phone]   Fax:  [phone]  Physical Therapy Treatment  Patient Details  Name: Candice [name] MRN: [id number] Date of Birth: 01-21-1976 Referring Provider (PT): Jonathon Jordan, [place]   Encounter Date: 09/10/2019   PT End of Session - 09/10/19 1619    Visit Number 8    Authorization Type Medicaid    Authorization - Visit Number 4    Authorization - Number of Visits 12    PT Start Time 7591    PT Stop Time 1701    PT Time Calculation (min) 42 min    Activity Tolerance Patient tolerated treatment well    Behavior During Therapy [facility] for tasks assessed/performed           Past Medical History:  Diagnosis Date  . Breast cancer ([place]) 05/2016   right  . Chronic constipation   . Family history of cancer   . Fibroid, uterine   . IBS (irritable bowel syndrome)    IBS - C (constipation)  . Personal history of chemotherapy   . Personal history of radiation therapy     Past Surgical History:  Procedure Laterality Date  . BARTHOLIN CYST MARSUPIALIZATION  09/04/2002  . BARTHOLIN GLAND CYST EXCISION Left 07/13/2005  . BREAST LUMPECTOMY Right 06/07/2016  . BREAST LUMPECTOMY WITH RADIOACTIVE SEED AND SENTINEL LYMPH NODE BIOPSY Right 06/07/2016   Procedure: BREAST LUMPECTOMY WITH RADIOACTIVE SEED AND SENTINEL LYMPH NODE BIOPSY;  Surgeon: Alphonsa Overall, MD;  Location: [place];  Service: General;  Laterality: Right;  . HYSTEROSCOPY  2016  . MYOMECTOMY  07/20/2004   Laparotomy  . OVARIAN CYST REMOVAL Right 07/20/2004  . PORTACATH PLACEMENT N/A 07/05/2016   REMOVED Fall 2018 ; Procedure: INSERTION PORT-A-CATH WITH [place];  Surgeon: Alphonsa Overall, MD;  Location: [place];  Service: General;  Laterality: N/A;  . ROBOTIC ASSISTED TOTAL HYSTERECTOMY WITH BILATERAL SALPINGO OOPHERECTOMY Bilateral 01/04/2018   Procedure: XI ROBOTIC  ASSISTED TOTAL HYSTERECTOMY WITH BILATERAL SALPINGO OOPHORECTOMY;  Surgeon: Isabel Caprice, MD;  [facility];  Service: Gynecology;  Laterality: Bilateral;    There were no vitals filed for this visit.   Subjective Assessment - 09/10/19 1622    Subjective Pt states when she does her exercises she is feeling better.  Pt had her schedule thrown off with graduations so wasn't doing as much of the exercises.  I have to go up and down the stairs a lot working at home and I find that it effects me.    Pertinent History name pronounced [name] (also goes by "T")    Patient Stated Goals be able to dance at a club or concert and stand for the whole    Currently in Pain? 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, unspecified knee    Referring Provider (PT) Jonathon Jordan, M      Strength   Right Hip External Rotation  5/5    Right Hip ABduction 5/5    Left Hip External Rotation 5/5    Left Hip ABduction 5/5    Left Hip ADduction 5/5                         [facility] Adult PT Treatment/Exercise -  09/12/19 0001      Lumbar Exercises: Aerobic   Nustep L3 x 8 minutes      Knee/Hip Exercises: Standing   Forward Lunges Limitations hip hing on one LE with slider under the other foot - TC and VC to keep pelvis level    Forward Step Up Right;Left;20 reps    Other Standing Knee Exercises hip hinge without dowel 10x; 2lbweights 10x                       PT Long Term Goals - 09/10/19 1626      PT LONG TERM GOAL #1   Title Pt will have increase in BLE strength to 5/5 MMT which will allow for safe return to gym program on her own after d/c    Baseline 5/5    Status Achieved      PT LONG TERM GOAL #2   Title Pt will be able to complete single leg mini squat with min UE support x1 and with minimal knee valgus deviation x10 reps which will reflect improvements in  LE strength and neuromusular control    Baseline unable to do 10x; can do 7x Lt and cannot do on Rt LE    Time 8    Period Weeks    Status Revised    Target Date 11/05/19      PT LONG TERM GOAL #3   Title Pt will be independent in a home exercise program for stretching and strengthening bilateral knees    Baseline further advancement needed as pt gets stronger    Time 8    Period Weeks    Status On-going    Target Date 11/05/19      PT LONG TERM GOAL #4   Title Pt will report atleast 75% improvement in her pain with daily activity and have good understanding of safe exercise progressions and activity for the gym    Baseline 60%    Time 8    Period Weeks    Status Revised    Target Date 11/05/19      PT LONG TERM GOAL #5   Title Pt will be able to walk and stand for at least 2 hours at a time for household chores and work/social activities    Baseline limited to 1 to 1.5 hours    Time 8    Period Weeks    Status Revised    Target Date 11/05/19      Additional Long Term Goals   Additional Long Term Goals Yes      PT LONG TERM GOAL #6   Title Pt will be able to go up and down the stairs 8x/day for work and be able to stand to take a shower at the end of the day    Time 8    Period Weeks    Status New    Target Date 11/05/19                 Plan - 09/11/19 0752    Clinical Impression Statement Pt continues to make progress with PT.  Due to scheduling and her work schedule it has been challenging to get in for the amount of visits needed/requested.  She will benefit form skilled PT to continue to work on her goals as previously stated and goals that have been revised and added.  Pt is not expected to be pain free but is making progress towards the functional strength needed for  ability to get up and down stairs throughout the day as well as being up and moving for at least 2 hours at a time so she can perform household chores and work related activities.  Pt demonstrates  improved hip strength as mentioned above, but still lacking stability during functional movements.  Continue to progress to address impairments and funcitonal goals.    Comorbidities hx of hysterectomy, estrogen positive breast cancer; chronic and progressive condition    PT Frequency 2x / week    PT Duration 8 weeks    PT Treatment/Interventions ADLs/Self Care Home Management;Cryotherapy;Electrical Stimulation;Moist Heat;Iontophoresis 4mg /ml Dexamethasone;Neuromuscular re-education;Therapeutic exercise;Therapeutic activities;Stair training;Gait training;Patient/family education;Manual techniques;Dry needling;Passive range of motion;Taping    PT Next Visit Plan bil hip and knee strength, stability and flexibility. Review lateral step down and hip hinge and add weight as tolerated; Continue to work on hip and knee stability, strength and alignment.    PT Home Exercise Plan Access Code: [email or web address]    Consulted and Agree with Plan of Care Patient           Patient will benefit from skilled therapeutic intervention in order to improve the following deficits and impairments:  Pain, Decreased strength, Postural dysfunction, Increased muscle spasms, Increased fascial restricitons  Visit Diagnosis: Chronic pain of left knee  Chronic pain of right knee  Muscle weakness (generalized)  Other muscle spasm  Abnormal posture     Problem List Patient Active Problem List   Diagnosis Date Noted  . Globus pharyngeus 09/17/2018  . Laryngopharyngeal reflux (LPR) 09/17/2018  . Fibroid uterus 01/04/2018  . Fibroid, uterine 08/21/2017  . Other constipation 09/29/2016  . Port catheter in place 09/06/2016  . Genetic testing 05/09/2016  . Malignant neoplasm of upper-inner quadrant of right breast in female, estrogen receptor positive ([place]) 04/27/2016  . Family history of breast cancer   . Family history of ovarian cancer   . Family history of prostate cancer   . Family history of pancreatic cancer      Jule Ser, PT 09/12/2019, 12:42 PM  [facility] [address] [place], [place], [address] Phone: [phone]   Fax:  [phone]  Name: Candice [name] MRN: [id number] Date of Birth: [birth year]

## 2019-09-12 ENCOUNTER — Ambulatory Visit: Payer: BC Managed Care – PPO | Admitting: Physician Assistant

## 2019-09-12 ENCOUNTER — Encounter: Payer: BC Managed Care – PPO | Admitting: Physical Therapy

## 2019-09-12 NOTE — Addendum Note (Signed)
Addended by: Su Hoff on: 09/12/2019 12:44 PM   Modules accepted: Orders

## 2019-10-08 ENCOUNTER — Other Ambulatory Visit: Payer: Self-pay

## 2019-10-08 ENCOUNTER — Ambulatory Visit: Payer: BC Managed Care – PPO | Attending: Family Medicine | Admitting: Physical Therapy

## 2019-10-08 ENCOUNTER — Encounter: Payer: Self-pay | Admitting: Physical Therapy

## 2019-10-08 DIAGNOSIS — G8929 Other chronic pain: Secondary | ICD-10-CM | POA: Diagnosis present

## 2019-10-08 DIAGNOSIS — M25562 Pain in left knee: Secondary | ICD-10-CM | POA: Insufficient documentation

## 2019-10-08 DIAGNOSIS — M25561 Pain in right knee: Secondary | ICD-10-CM | POA: Insufficient documentation

## 2019-10-08 DIAGNOSIS — M6281 Muscle weakness (generalized): Secondary | ICD-10-CM

## 2019-10-08 NOTE — Therapy (Signed)
[facility] [address] [place], [place], [address] Phone: [phone]   Fax:  [phone]  Physical Therapy Treatment  Patient Details  Name: Candice [name] MRN: [id number] Date of Birth: [birth year] Referring Provider (PT): Jonathon Jordan, [place]   Encounter Date: 10/08/2019   PT End of Session - 10/08/19 1655    Visit Number 9    Date for PT Re-Evaluation 11/05/19    Authorization Type Medicaid    Authorization - Visit Number 5    Authorization - Number of Visits 12    PT Start Time 5456    PT Stop Time 2563    PT Time Calculation (min) 38 min    Activity Tolerance Patient tolerated treatment well;No increased pain    Behavior During Therapy WFL for tasks assessed/performed           Past Medical History:  Diagnosis Date  . Breast cancer ([place]) 05/2016   right  . Chronic constipation   . Family history of cancer   . Fibroid, uterine   . IBS (irritable bowel syndrome)    IBS - C (constipation)  . Personal history of chemotherapy   . Personal history of radiation therapy     Past Surgical History:  Procedure Laterality Date  . BARTHOLIN CYST MARSUPIALIZATION  09/04/2002  . BARTHOLIN GLAND CYST EXCISION Left 07/13/2005  . BREAST LUMPECTOMY Right 06/07/2016  . BREAST LUMPECTOMY WITH RADIOACTIVE SEED AND SENTINEL LYMPH NODE BIOPSY Right 06/07/2016   Procedure: BREAST LUMPECTOMY WITH RADIOACTIVE SEED AND SENTINEL LYMPH NODE BIOPSY;  Surgeon: Alphonsa Overall, MD;  Location: [place];  Service: General;  Laterality: Right;  . HYSTEROSCOPY  2016  . MYOMECTOMY  07/20/2004   Laparotomy  . OVARIAN CYST REMOVAL Right 07/20/2004  . PORTACATH PLACEMENT N/A 07/05/2016   REMOVED Fall 2018 ; Procedure: INSERTION PORT-A-CATH WITH [place];  Surgeon: Alphonsa Overall, MD;  Location: [place];  Service: General;  Laterality: N/A;  . ROBOTIC ASSISTED TOTAL HYSTERECTOMY WITH BILATERAL SALPINGO  OOPHERECTOMY Bilateral 01/04/2018   Procedure: XI ROBOTIC ASSISTED TOTAL HYSTERECTOMY WITH BILATERAL SALPINGO OOPHORECTOMY;  Surgeon: Isabel Caprice, MD;  Location: [facility];  Service: Gynecology;  Laterality: Bilateral;    There were no vitals filed for this visit.   Subjective Assessment - 10/08/19 1626    Subjective Pt states that she is being more intentional with her leg exercises. This is making a big difference with her pain. She still has a little bit of pain, but it isn't intense.    Pertinent History name pronounced [name] (also goes by "T")    Patient Stated Goals be able to dance at a club or concert and stand for the whole    Currently in Pain? No/denies                             [facility] Adult PT Treatment/Exercise - 10/08/19 0001      Knee/Hip Exercises: Standing   Step Down Right;Left;3 sets;10 reps;Hand Hold: 1;Step Height: 6"    Step Down Limitations last 2 sets holding #10 dumbbell in opposite hand     Other Standing Knee Exercises power tower deadlift #25 x15 reps     Other Standing Knee Exercises single leg sit 2x10 reps 1st set with foam pad in seat         seated recumbent bike L2 x5 min PT Present to discuss progress with home  program         PT Education - 10/08/19 1655    Education Details decreasing PT POC    Person(s) Educated Patient    Methods Explanation    Comprehension Verbalized understanding               PT Long Term Goals - 09/10/19 1626      PT LONG TERM GOAL #1   Title Pt will have increase in BLE strength to 5/5 MMT which will allow for safe return to gym program on her own after d/c    Baseline 5/5    Status Achieved      PT LONG TERM GOAL #2   Title Pt will be able to complete single leg mini squat with min UE support x1 and with minimal knee valgus deviation x10 reps which will reflect improvements in LE strength and neuromusular control    Baseline unable to do 10x; can do 7x Lt and cannot do on Rt  LE    Time 8    Period Weeks    Status Revised    Target Date 11/05/19      PT LONG TERM GOAL #3   Title Pt will be independent in a home exercise program for stretching and strengthening bilateral knees    Baseline further advancement needed as pt gets stronger    Time 8    Period Weeks    Status On-going    Target Date 11/05/19      PT LONG TERM GOAL #4   Title Pt will report atleast 75% improvement in her pain with daily activity and have good understanding of safe exercise progressions and activity for the gym    Baseline 60%    Time 8    Period Weeks    Status Revised    Target Date 11/05/19      PT LONG TERM GOAL #5   Title Pt will be able to walk and stand for at least 2 hours at a time for household chores and work/social activities    Baseline limited to 1 to 1.5 hours    Time 8    Period Weeks    Status Revised    Target Date 11/05/19      Additional Long Term Goals   Additional Long Term Goals Yes      PT LONG TERM GOAL #6   Title Pt will be able to go up and down the stairs 8x/day for work and be able to stand to take a shower at the end of the day    Time 8    Period Weeks    Status New    Target Date 11/05/19                 Plan - 10/08/19 1657    Clinical Impression Statement Pt is doing well with her gym program. She has mild discomfort in the knees during the day but has been seeing improvements in this since performing her HEP and gym program more consistently. PT had good knee valgus control with step downs and single leg sit compared to previous sessions. PT progressed deadlifts with some resistance so that she can include this in her current gym routine. Pt was agreeable with decrease in PT frequency to 1xweek for the remainder of her POC secondary to increasing exercise independence and improving pain.    Comorbidities hx of hysterectomy, estrogen positive breast cancer; chronic and progressive condition    PT Frequency  2x / week    PT  Duration 8 weeks    PT Treatment/Interventions ADLs/Self Care Home Management;Cryotherapy;Electrical Stimulation;Moist Heat;Iontophoresis 4mg /ml Dexamethasone;Neuromuscular re-education;Therapeutic exercise;Therapeutic activities;Stair training;Gait training;Patient/family education;Manual techniques;Dry needling;Passive range of motion;Taping    PT Next Visit Plan bil hip and knee strength, stability and flexibility. Review lateral step down and hip hinge and add weight as tolerated; Continue to work on hip and knee stability, strength and alignment.    PT Home Exercise Plan Access Code: [email or web address]    Consulted and Agree with Plan of Care Patient           Patient will benefit from skilled therapeutic intervention in order to improve the following deficits and impairments:  Pain, Decreased strength, Postural dysfunction, Increased muscle spasms, Increased fascial restricitons  Visit Diagnosis: Chronic pain of left knee  Chronic pain of right knee  Muscle weakness (generalized)     Problem List Patient Active Problem List   Diagnosis Date Noted  . Globus pharyngeus 09/17/2018  . Laryngopharyngeal reflux (LPR) 09/17/2018  . Fibroid uterus 01/04/2018  . Fibroid, uterine 08/21/2017  . Other constipation 09/29/2016  . Port catheter in place 09/06/2016  . Genetic testing 05/09/2016  . Malignant neoplasm of upper-inner quadrant of right breast in female, estrogen receptor positive ([place]) 04/27/2016  . Family history of breast cancer   . Family history of ovarian cancer   . Family history of prostate cancer   . Family history of pancreatic cancer     5:10 PM,10/08/19 Sherol Dade PT, DPT [facility] [address] [place], [place], [address] Phone: [phone]   Fax:  [phone]  Name: Candice [name] MRN: [id number] Date of Birth:  04-Jun-1975

## 2019-10-09 ENCOUNTER — Encounter: Payer: BC Managed Care – PPO | Admitting: Physical Therapy

## 2019-10-10 ENCOUNTER — Ambulatory Visit: Payer: BC Managed Care – PPO | Admitting: Podiatry

## 2019-10-14 ENCOUNTER — Encounter: Payer: BC Managed Care – PPO | Admitting: Physical Therapy

## 2019-10-16 ENCOUNTER — Ambulatory Visit: Payer: BC Managed Care – PPO

## 2019-10-21 ENCOUNTER — Encounter: Payer: BC Managed Care – PPO | Admitting: Physical Therapy

## 2019-10-23 ENCOUNTER — Ambulatory Visit: Payer: BC Managed Care – PPO | Admitting: Physical Therapy

## 2019-10-28 ENCOUNTER — Encounter: Payer: BC Managed Care – PPO | Admitting: Physical Therapy

## 2019-10-30 ENCOUNTER — Ambulatory Visit: Payer: BC Managed Care – PPO | Attending: Family Medicine | Admitting: Physical Therapy

## 2019-10-30 ENCOUNTER — Other Ambulatory Visit: Payer: Self-pay

## 2019-10-30 ENCOUNTER — Encounter: Payer: Self-pay | Admitting: Physical Therapy

## 2019-10-30 DIAGNOSIS — R293 Abnormal posture: Secondary | ICD-10-CM | POA: Diagnosis present

## 2019-10-30 DIAGNOSIS — G8929 Other chronic pain: Secondary | ICD-10-CM | POA: Insufficient documentation

## 2019-10-30 DIAGNOSIS — M25561 Pain in right knee: Secondary | ICD-10-CM | POA: Diagnosis present

## 2019-10-30 DIAGNOSIS — M62838 Other muscle spasm: Secondary | ICD-10-CM | POA: Insufficient documentation

## 2019-10-30 DIAGNOSIS — M25562 Pain in left knee: Secondary | ICD-10-CM | POA: Insufficient documentation

## 2019-10-30 DIAGNOSIS — M6281 Muscle weakness (generalized): Secondary | ICD-10-CM | POA: Diagnosis present

## 2019-10-30 NOTE — Therapy (Addendum)
[facility] [address], [place], [place], [address] Phone: [phone]   Fax:  [phone]  Physical Therapy Treatment  Patient Details  Name: Candice [name] MRN: [id number] Date of Birth: 1975/04/22 Referring Provider (PT): Jonathon Jordan, [place]   Encounter Date: 10/30/2019   PT End of Session - 10/30/19 1625    Visit Number 10    Date for PT Re-Evaluation 11/05/19    Authorization Type Medicaid    Authorization - Visit Number 6    Authorization - Number of Visits 12    PT Start Time 6767    PT Stop Time 2094    PT Time Calculation (min) 40 min    Activity Tolerance Patient tolerated treatment well;No increased pain    Behavior During Therapy WFL for tasks assessed/performed           Past Medical History:  Diagnosis Date  . Breast cancer ([place]) 05/2016   right  . Chronic constipation   . Family history of cancer   . Fibroid, uterine   . IBS (irritable bowel syndrome)    IBS - C (constipation)  . Personal history of chemotherapy   . Personal history of radiation therapy     Past Surgical History:  Procedure Laterality Date  . BARTHOLIN CYST MARSUPIALIZATION  09/04/2002  . BARTHOLIN GLAND CYST EXCISION Left 07/13/2005  . BREAST LUMPECTOMY Right 06/07/2016  . BREAST LUMPECTOMY WITH RADIOACTIVE SEED AND SENTINEL LYMPH NODE BIOPSY Right 06/07/2016   Procedure: BREAST LUMPECTOMY WITH RADIOACTIVE SEED AND SENTINEL LYMPH NODE BIOPSY;  Surgeon: Alphonsa Overall, MD;  Location: [place];  Service: General;  Laterality: Right;  . HYSTEROSCOPY  2016  . MYOMECTOMY  07/20/2004   Laparotomy  . OVARIAN CYST REMOVAL Right 07/20/2004  . PORTACATH PLACEMENT N/A 07/05/2016   REMOVED Fall 2018 ; Procedure: INSERTION PORT-A-CATH WITH [place];  Surgeon: Alphonsa Overall, MD;  Location: [facility];  Service: General;  Laterality: N/A;  . ROBOTIC ASSISTED TOTAL HYSTERECTOMY WITH BILATERAL SALPINGO  OOPHERECTOMY Bilateral 01/04/2018   Procedure: XI ROBOTIC ASSISTED TOTAL HYSTERECTOMY WITH BILATERAL SALPINGO OOPHORECTOMY;  Surgeon: Isabel Caprice, MD;  [facility];  Service: Gynecology;  Laterality: Bilateral;    There were no vitals filed for this visit.   Subjective Assessment - 10/30/19 1619    Subjective Pt states the pain is not as intense and as frequent.  States she jogs a little and does the eliptical.    Patient Stated Goals be able to dance at a club or concert and stand for the whole    Currently in Pain? No/denies                             [facility] Adult PT Treatment/Exercise - 10/30/19 0001      Lumbar Exercises: Aerobic   Nustep L5 x 8 min PT present for status update      Lumbar Exercises: Supine   Large Ball Abdominal Isometric Limitations holding red pball overhead back flat and LE 90-90 10x 5 sec; then LE straight 5 x 10 sec      Knee/Hip Exercises: Standing   Step Down Right;Left;3 sets;10 reps;Hand Hold: 1;Step Height: 6"    Step Down Limitations last 2 sets holding #10 dumbbell in opposite hand     Other Standing Knee Exercises dead lift with 30 lb bar    Other Standing Knee Exercises single leg sit 2x10 reps  1st set with foam pad in seat; able to remove pad after 10 reps sit to stand single leg      Knee/Hip Exercises: Prone   Other Prone Exercises red pball walk outs and roll in/out 10x                  PT Education - 10/30/19 1653    Education Details added pball walk outs, knees in and out and core in supine with LE ext and ball -    Person(s) Educated Patient    Methods Explanation;Demonstration    Comprehension Verbalized understanding;Returned demonstration               PT Long Term Goals - 10/30/19 1622      PT LONG TERM GOAL #3   Title Pt will be independent in a home exercise program for stretching and strengthening bilateral knees      PT LONG TERM GOAL #4   Title Pt will report atleast 75%  improvement in her pain with daily activity and have good understanding of safe exercise progressions and activity for the gym    Baseline 70% improved    Status Partially Met      PT LONG TERM GOAL #5   Title Pt will be able to walk and stand for at least 2 hours at a time for household chores and work/social activities    Baseline After 2 hours of gym work my knees are not bad; just tired    Status Achieved      PT LONG TERM GOAL #6   Title Pt will be able to go up and down the stairs 8x/day for work and be able to stand to take a shower at the end of the day    Baseline "I don't know"    Status On-going                 Plan - 10/30/19 1656    Clinical Impression Statement Pt did well with exercises and able to proress to single leg sit to stand without foam mat 10x each side.  Today's session we focused on adding exercises for core. Pt has made good progress with goals and should be able to discharge next visit as she has been able to progress on her own at the gym.    PT Treatment/Interventions ADLs/Self Care Home Management;Cryotherapy;Electrical Stimulation;Moist Heat;Iontophoresis 43m/ml Dexamethasone;Neuromuscular re-education;Therapeutic exercise;Therapeutic activities;Stair training;Gait training;Patient/family education;Manual techniques;Dry needling;Passive range of motion;Taping    PT Next Visit Plan re-assess goals and review pball exercises and core as needed - planks and side planks possibly    PT Home Exercise Plan Access Code: [email or web address]   Consulted and Agree with Plan of Care Patient           Patient will benefit from skilled therapeutic intervention in order to improve the following deficits and impairments:  Pain, Decreased strength, Postural dysfunction, Increased muscle spasms, Increased fascial restricitons  Visit Diagnosis: Chronic pain of left knee  Chronic pain of right knee  Muscle weakness (generalized)  Other muscle spasm  Abnormal  posture     Problem List Patient Active Problem List   Diagnosis Date Noted  . Globus pharyngeus 09/17/2018  . Laryngopharyngeal reflux (LPR) 09/17/2018  . Fibroid uterus 01/04/2018  . Fibroid, uterine 08/21/2017  . Other constipation 09/29/2016  . Port catheter in place 09/06/2016  . Genetic testing 05/09/2016  . Malignant ne[place] of upper-inner quadrant of right breast [place]  04/27/2016  . Family history of breast cancer   . Family history of ovarian cancer   . Family history of prostate cancer   . Family history of pancreatic cancer     Jule Ser, PT 10/30/2019, 5:05 PM PHYSICAL THERAPY DISCHARGE SUMMARY  Visits from Start of Care: 10  Current functional level related to goals / functional outcomes: Pt canceled remaining appt and reports that she is ready for D/C as she is feeling better.     Remaining deficits: See above for current status.     Education / Equipment: HEP Plan: Patient agrees to discharge.  Patient goals were partially met. Patient is being discharged due to being pleased with the current functional level.  ?????        Sigurd Sos, PT 11/04/19 4:02 PM   [facility] [address] [place], [place], [address] Phone: [phone]   Fax:  [phone]  Name: Lasheba Stevens MRN: [id number] Date of Birth: 09-01-1975

## 2019-10-31 ENCOUNTER — Ambulatory Visit: Payer: BC Managed Care – PPO | Admitting: Podiatry

## 2019-11-04 ENCOUNTER — Ambulatory Visit: Payer: BC Managed Care – PPO

## 2019-11-13 ENCOUNTER — Other Ambulatory Visit: Payer: Self-pay | Admitting: Oncology

## 2019-11-13 DIAGNOSIS — Z170 Estrogen receptor positive status [ER+]: Secondary | ICD-10-CM

## 2019-11-13 DIAGNOSIS — C50211 Malignant neoplasm of upper-inner quadrant of right female breast: Secondary | ICD-10-CM

## 2019-11-26 ENCOUNTER — Ambulatory Visit: Payer: BC Managed Care – PPO | Admitting: Podiatry

## 2019-12-06 ENCOUNTER — Telehealth: Payer: Self-pay | Admitting: *Deleted

## 2019-12-06 NOTE — Telephone Encounter (Signed)
This RN spoke with pt per her VM stating she has onset x 3 episodes of " blood in my urine ".  She states " I do not really know which doctor I should call to further evaluate this "  She states episode on 9/7 most recent.  " It is when I pee- and looking in the toilet and see the blood in my urine "  She states she has had several intermittent occurrences - not ongoing at one time - just more periodically.  She denies any pain, burning or urgency with urination.  Pt has had a complete hysterectomy and is on anastrazole presently.  This RN discussed above may be more related to dry vaginal and genital tissues due to being post menopausal and on anastrazole.  This RN recommended she see her GYN ( not the surgical Onc/Gyn) for further evaluation ( discussed likely needs an internal exam of vaginal walls ).  Candice [name] states she has a regular gyn ( forgot her name ) and will call her office per above.  Pt also verbalized understanding to call this office again for further assistance if need arises.

## 2019-12-26 ENCOUNTER — Ambulatory Visit: Payer: BC Managed Care – PPO | Admitting: Podiatry

## 2020-01-14 ENCOUNTER — Ambulatory Visit: Payer: BC Managed Care – PPO | Admitting: Podiatry

## 2020-02-17 ENCOUNTER — Ambulatory Visit: Payer: BC Managed Care – PPO | Admitting: Podiatry

## 2020-04-08 ENCOUNTER — Other Ambulatory Visit: Payer: Self-pay | Admitting: Oncology

## 2020-04-08 DIAGNOSIS — Z9889 Other specified postprocedural states: Secondary | ICD-10-CM

## 2020-04-08 DIAGNOSIS — Z853 Personal history of malignant neoplasm of breast: Secondary | ICD-10-CM

## 2020-05-19 ENCOUNTER — Ambulatory Visit
Admission: RE | Admit: 2020-05-19 | Discharge: 2020-05-19 | Disposition: A | Discharge status: Home / Self Care | Payer: BC Managed Care – PPO | Source: Ambulatory Visit | Attending: Oncology | Admitting: Oncology

## 2020-05-19 DIAGNOSIS — Z9889 Other specified postprocedural states: Secondary | ICD-10-CM

## 2020-05-19 DIAGNOSIS — Z853 Personal history of malignant neoplasm of breast: Secondary | ICD-10-CM

## 2020-05-26 ENCOUNTER — Other Ambulatory Visit: Payer: Self-pay | Admitting: Oncology

## 2020-05-26 DIAGNOSIS — R928 Other abnormal and inconclusive findings on diagnostic imaging of breast: Secondary | ICD-10-CM

## 2020-05-29 ENCOUNTER — Other Ambulatory Visit: Payer: Self-pay

## 2020-05-29 DIAGNOSIS — C50211 Malignant neoplasm of upper-inner quadrant of right female breast: Secondary | ICD-10-CM

## 2020-05-29 DIAGNOSIS — Z170 Estrogen receptor positive status [ER+]: Secondary | ICD-10-CM

## 2020-05-30 ENCOUNTER — Ambulatory Visit
Admission: RE | Admit: 2020-05-30 | Discharge: 2020-05-30 | Disposition: A | Discharge status: Home / Self Care | Payer: BC Managed Care – PPO | Source: Ambulatory Visit | Attending: Oncology | Admitting: Oncology

## 2020-05-30 ENCOUNTER — Other Ambulatory Visit: Payer: Self-pay

## 2020-05-30 ENCOUNTER — Ambulatory Visit: Payer: BC Managed Care – PPO

## 2020-05-30 DIAGNOSIS — R928 Other abnormal and inconclusive findings on diagnostic imaging of breast: Secondary | ICD-10-CM

## 2020-06-01 NOTE — Progress Notes (Signed)
[name]  [phone] [phone]     ID: Candice [name] DOB: Aug 31, 1975  MR#: [id number]  TZG#:[id number]  Patient Care Team: Jonathon Jordan, MD as PCP - General (Family Medicine) Alphonsa Overall, MD as Consulting Physician (General Surgery) Brihanna Devenport, Virgie Dad, MD as Consulting Physician (Oncology) Eppie Gibson, MD as Attending Physician (Radiation Oncology) Cheryll Cockayne, MD as Referring Physician (Surgical Oncology) Delice Bison, Charlestine Massed, NP as Nurse Practitioner (Hematology and Oncology) Isabel Caprice, MD (Inactive) as Consulting Physician (Gynecologic Oncology) OTHER MD:  CHIEF COMPLAINT: estrogen receptor positive breast cancer  CURRENT TREATMENT:  anastrozole   INTERVAL HISTORY: Sabrina returns today for follow-up of her estrogen receptor positive breast cancer accompanied by her sister.   She continues on anastrozole, with good tolerance.  Her hot flushes and night sweats are better.  She continues to have problems with constipation which however are not going to be related to this medication.  Her most recent bone density screening on 10/09/2018 showed a T-score of -0.9, which is considered normal.  She had routine screening mammography on 05/19/2020 showing a possible abnormality in the left breast. She underwent left diagnostic mammogram at [facility] on 05/30/2020 showing: breast density category C; no evidence of malignancy.   REVIEW OF SYSTEMS: Shadai tells me for reasons that she does not understand she has been feeling anxious.  This is unusual for her.  She says her work is the same as before, family is doing all right, and she is continuing to exercise although perhaps not walking or running as much as before because of some knee issues.  She was tried on Zoloft with no results.  Aside from this issue a detailed review of systems today was benign   COVID 19 VACCINATION STATUS: Status post J&J followed by a  Moderna booster November 2021    BREAST CANCER HISTORY: From the original intake note:  Candice [name] [place] in December, at which time a mass in the right breast was noted. She was referred for BCCCP and screening mammography was obtained, showing a possible mass in the right breast.on 04/21/2016 she underwent right diagnostic mammography with tomography and ultrasonography at the breast Center, and this confirmedan irregular spiculat in the right breast measuring 1.8 cm, which was not palpable to the mammographer. Ultrasonography confirmed an irregular hypoechoic right breast mass at the 12:30 o'clock position 4 cm from the nipple measuring 2.2 cm. The right axilla was sonographically benign.  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  Her subsequent history is detailed below   PAST MEDICAL HISTORY: Past Medical History:  Diagnosis Date  . Breast cancer ([place]) 05/2016   right  . Chronic constipation   . Family history of cancer   . Fibroid, uterine   . IBS (irritable bowel syndrome)    IBS - C (constipation)  . Personal history of chemotherapy   . Personal history of radiation therapy     PAST SURGICAL HISTORY: Past Surgical History:  Procedure Laterality Date  . BARTHOLIN CYST MARSUPIALIZATION  09/04/2002  . BARTHOLIN GLAND CYST EXCISION Left 07/13/2005  . BREAST LUMPECTOMY Right 06/07/2016  . BREAST LUMPECTOMY WITH RADIOACTIVE SEED AND SENTINEL LYMPH NODE BIOPSY Right 06/07/2016  Procedure: BREAST LUMPECTOMY WITH RADIOACTIVE SEED AND SENTINEL LYMPH NODE BIOPSY;  Surgeon: Alphonsa Overall, MD;  Location: [facility];  Service: General;  Laterality: Right;  .  HYSTEROSCOPY  2016  . MYOMECTOMY  07/20/2004   Laparotomy  . OVARIAN CYST REMOVAL Right 07/20/2004  . PORTACATH PLACEMENT N/A 07/05/2016   REMOVED Fall 2018 ; Procedure: INSERTION PORT-A-CATH WITH [place];  Surgeon: Alphonsa Overall, MD;  Location: [facility];  Service: General;  Laterality: N/A;  . ROBOTIC ASSISTED TOTAL HYSTERECTOMY WITH BILATERAL SALPINGO OOPHERECTOMY Bilateral 01/04/2018   Procedure: XI ROBOTIC ASSISTED TOTAL HYSTERECTOMY WITH BILATERAL SALPINGO OOPHORECTOMY;  Surgeon: Isabel Caprice, MD;  Location: [facility];  Service: Gynecology;  Laterality: Bilateral;    FAMILY HISTORY Family History  Problem Relation Age of Onset  . Breast cancer Mother 68  . Pancreatic cancer Paternal Grandmother   . Prostate cancer Paternal Grandfather   . Ovarian cancer Other        MGMs sister  The patient's father, Candice [name], lives in [place] and works as a Geneticist, molecular. The patient's mother is a Quarry manager. The patient has no brothers and 1 sister, who works in [place] as a Network engineer. The patient's mother had breast cancer, stage 0, diagnosed at age 53. There are 2 maternal relatives (mother's sister and mother's niece) with breast cancer, both postmenopausal.   GYNECOLOGIC HISTORY:  Menarche age 77, the patient is GX P0. Patient's last menstrual period was 06/18/2016.   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 She generally lives in [place] by herself although she is currently staying at her parents' in [place].  Both her parents work, her mother in a nursing home and her father for the water department in [place].  Her sister works in [place] as a Network engineer.    ADVANCED DIRECTIVES: Not in place. The patient tells me she intends to name her sister as her healthcare power of attorney.    HEALTH  MAINTENANCE: Social History   Tobacco Use  . Smoking status: Never Smoker  . Smokeless tobacco: Never Used  Vaping Use  . Vaping Use: Never used  Substance Use Topics  . Alcohol use: Yes    Alcohol/week: 1.0 standard drink    Types: 1 Glasses of wine per week    Comment: once per month  . Drug use: No     Colonoscopy: July 2016, in [place]  PAP:  Bone density: Never   Allergies  Allergen Reactions  . Paclitaxel Itching  . Amoxicillin Rash    Has patient had a PCN reaction causing immediate rash, facial/tongue/throat swelling, SOB or lightheadedness with hypotension: Yes Has patient had a PCN reaction causing severe rash involving mucus membranes or skin necrosis: Yes Has patient had a PCN reaction that required hospitalization: Yes Has patient had a PCN reaction occurring within the last 10 years: No If all of the above answers are "NO", then may proceed with Cephalosporin use.     Current Outpatient Medications  Medication Sig Dispense Refill  . anastrozole (ARIMIDEX) 1 MG tablet TAKE 1 TABLET(1 MG) BY MOUTH DAILY 90 tablet 4  . cholecalciferol (VITAMIN D3) 25 MCG (1000 UT) tablet Take 1 tablet (1,000 Units total) by mouth daily. 90 tablet 4  . docusate sodium (COLACE) 100 MG capsule Take 1 capsule (100 mg total) by mouth every 12 (twelve) hours. [place]  capsule 0  . docusate sodium (COLACE) 100 MG capsule Take 2 capsules (200 mg total) by mouth daily with breakfast. 60 capsule 3  . etodolac (LODINE) 400 MG tablet Take 400 mg by mouth 2 (two) times daily.    Marland Kitchen gabapentin (NEURONTIN) 300 MG capsule Take 1 capsule (300 mg total) by mouth at bedtime. 90 capsule 4  . metoCLOPramide (REGLAN) 5 MG tablet Take 1 tablet (5 mg total) by mouth daily with breakfast. 90 tablet 4  . naproxen (NAPROSYN) 500 MG tablet Take 1,000 mg by mouth every 12 (twelve) hours as needed for pain.    Marland Kitchen omeprazole (PRILOSEC) 40 MG capsule Take 40 mg by mouth daily.    . sertraline (ZOLOFT) 50 MG tablet  TAKE 1/2 TABLET BY MOUTH ONCE A DAY FOR 7 DAYS THEN 1 TABLET ONCE A DAY    . tiZANidine (ZANAFLEX) 2 MG tablet Take 2 mg by mouth 3 (three) times daily.    . vitamin B-12 (CYANOCOBALAMIN) 100 MCG tablet Take 100 mcg by mouth daily.     No current facility-administered medications for this visit.    OBJECTIVE: African-American woman who appears well  Vitals:   06/02/20 1540  BP: 110/71  Pulse: 82  Resp: 20  Temp: 97.7 F (36.5 C)  SpO2: 100%     Body mass index is 27.34 kg/m.    ECOG FS: 1 - Symptomatic but completely ambulatory Filed Weights   06/02/20 1540  Weight: 161 lb 12.8 oz (73.4 kg)    Sclerae unicteric, EOMs intact Wearing a mask No cervical or supraclavicular adenopathy Lungs no rales or rhonchi Heart regular rate and rhythm Abd soft, nontender, positive bowel sounds MSK no focal spinal tenderness, no upper extremity lymphedema Neuro: nonfocal, well oriented, appropriate affect Breasts: The right breast is status post lumpectomy followed by radiation.  There is no evidence of local recurrence.  The left breast and both axillae are benign   LAB RESULTS:  CMP     Component Value Date/Time   NA 139 06/02/2020 1518   NA 142 02/15/2017 1054   K 3.6 06/02/2020 1518   K 4.1 02/15/2017 1054   CL 105 06/02/2020 1518   CO2 26 06/02/2020 1518   CO2 25 02/15/2017 1054   GLUCOSE 88 06/02/2020 1518   GLUCOSE 71 02/15/2017 1054   BUN 15 06/02/2020 1518   BUN 12.3 02/15/2017 1054   CREATININE 0.87 06/02/2020 1518   CREATININE 0.9 02/15/2017 1054   CALCIUM 9.3 06/02/2020 1518   CALCIUM 9.6 02/15/2017 1054   PROT 7.6 06/02/2020 1518   PROT 7.0 02/15/2017 1054   ALBUMIN 4.3 06/02/2020 1518   ALBUMIN 3.8 02/15/2017 1054   AST 23 06/02/2020 1518   AST 25 02/15/2017 1054   ALT 13 06/02/2020 1518   ALT 15 02/15/2017 1054   ALKPHOS 76 06/02/2020 1518   ALKPHOS 62 02/15/2017 1054   BILITOT 0.4 06/02/2020 1518   BILITOT 0.60 02/15/2017 1054   GFRNONAA >60 06/02/2020  1518   GFRAA >60 06/03/2019 1500    INo results found for: SPEP, UPEP  Lab Results  Component Value Date   WBC 3.7 (L) 06/02/2020   NEUTROABS 1.4 (L) 06/02/2020   HGB 14.1 06/02/2020   HCT 40.8 06/02/2020   MCV 91.7 06/02/2020   PLT 213 06/02/2020      Chemistry      Component Value Date/Time   NA 139 06/02/2020 1518   NA 142 02/15/2017 1054   K 3.6 06/02/2020 1518  K 4.1 02/15/2017 1054   CL 105 06/02/2020 1518   CO2 26 06/02/2020 1518   CO2 25 02/15/2017 1054   BUN 15 06/02/2020 1518   BUN 12.3 02/15/2017 1054   CREATININE 0.87 06/02/2020 1518   CREATININE 0.9 02/15/2017 1054      Component Value Date/Time   CALCIUM 9.3 06/02/2020 1518   CALCIUM 9.6 02/15/2017 1054   ALKPHOS 76 06/02/2020 1518   ALKPHOS 62 02/15/2017 1054   AST 23 06/02/2020 1518   AST 25 02/15/2017 1054   ALT 13 06/02/2020 1518   ALT 15 02/15/2017 1054   BILITOT 0.4 06/02/2020 1518   BILITOT 0.60 02/15/2017 1054     No results found for: LABCA2  No components found for: LABCA125  No results for input(s): INR in the last 168 hours.  Urinalysis    Component Value Date/Time   COLORURINE STRAW (A) 04/20/2019 1911   APPEARANCEUR CLEAR 04/20/2019 1911   LABSPEC >1.046 (H) 04/20/2019 1911   PHURINE 6.0 04/20/2019 1911   GLUCOSEU NEGATIVE 04/20/2019 1911   HGBUR NEGATIVE 04/20/2019 1911   BILIRUBINUR NEGATIVE 04/20/2019 1911   KETONESUR NEGATIVE 04/20/2019 1911   PROTEINUR NEGATIVE 04/20/2019 1911   NITRITE NEGATIVE 04/20/2019 1911   LEUKOCYTESUR NEGATIVE 04/20/2019 1911    STUDIES: MM DIAG BREAST TOMO UNI LEFT  Result Date: 05/30/2020 CLINICAL DATA:  Screening recall for asymmetry seen in the left breast. History of right lumpectomy 2018. EXAM: DIGITAL DIAGNOSTIC UNILATERAL LEFT MAMMOGRAM WITH TOMOSYNTHESIS AND CAD TECHNIQUE: Left digital diagnostic mammography and breast tomosynthesis was performed. The images were evaluated with computer-aided detection. COMPARISON:  Previous exams.  ACR Breast Density Category c: The breast tissue is heterogeneously dense, which may obscure small masses. FINDINGS: Spot compression tomograms were performed over the inner left breast. The initially questioned possible left breast asymmetry resolves on the additional imaging with findings compatible with overlapping fibroglandular tissue. There is no mammographic evidence of malignancy in the left breast. IMPRESSION: No mammographic evidence of malignancy in the left breast. RECOMMENDATION: Screening mammogram in one year.(Code:SM-B-01Y) I have discussed the findings and recommendations with the patient. If applicable, a reminder letter will be sent to the patient regarding the next appointment. BI-RADS CATEGORY  1: Negative. Electronically Signed   By: Everlean Alstrom M.D.   On: 05/30/2020 11:41   MM 3D SCREEN BREAST BILATERAL  Result Date: 05/23/2020 CLINICAL DATA:  Screening. EXAM: DIGITAL SCREENING BILATERAL MAMMOGRAM WITH TOMOSYNTHESIS AND CAD TECHNIQUE: Bilateral screening digital craniocaudal and mediolateral oblique mammograms were obtained. Bilateral screening digital breast tomosynthesis was performed. The images were evaluated with computer-aided detection. COMPARISON:  Previous exam(s). ACR Breast Density Category c: The breast tissue is heterogeneously dense, which may obscure small masses. FINDINGS: In the left breast, a possible asymmetry warrants further evaluation. In the right breast, no findings suspicious for malignancy. IMPRESSION: Further evaluation is suggested for possible asymmetry in the left breast. RECOMMENDATION: Diagnostic mammogram and possibly ultrasound of the left breast. (Code:[id number]) The patient will be contacted regarding the findings, and additional imaging will be scheduled. BI-RADS CATEGORY  0: Incomplete. Need additional imaging evaluation and/or prior mammograms for comparison. Electronically Signed   By: Margarette Canada M.D.   On: 05/23/2020 19:24    ELIGIBLE FOR  AVAILABLE RESEARCH PROTOCOL: no  ASSESSMENT: 45 y.o. [place] woman currently residing in [place], status post right breast upper inner quadrant biopsy 04/21/2016 for a clinical T2 N0, stage IIa invasive ductal carcinoma, grade 2, estrogen and progesterone receptor positive, HER-2 nonamplified,  with an MIB-1 of 20%  (a) biopsy of 2 additional suspicious areas in the right breast 05/12/2016 was benign  (1) started tamoxifen 04/27/2016, Discontinued at the start of chemotherapy  (2) Oncotype DX obtained from the original biopsy showed a score of 14, predicting a 10 year risk of recurrence outside the breast of 9% if the patient's only systemic therapy is tamoxifen for 5 years. It also predicts no benefit from chemotherapy.   (3) patient is not interested in fertility preservation  (4) genetics testing 04/27/2016 through the [facility] gene panel offered by [facility] found no deleterious mutations in APC, ATM, BARD1, BMPR1A, BRCA1, BRCA2, BRIP1, CHD1, CDK4, CDKN2A, CHEK2, EPCAM (large rearrangement only), MLH1, MSH2, MSH6, MUTYH, NBN, PALB2, PMS2, PTEN, RAD51C, RAD51D, SMAD4, STK11, and TP53. Sequencing was performed for select regions of POLE and POLD1, and large rearrangement analysis was performed for select regions of GREM1.   (5) status post right lumpectomy with sentinel lymph node sampling 06/07/2016 for a pT1c pN1, stage IB invasive ductal carcinoma, grade 2, with negative margins   (6) Mammaprint sent from the final surgical sample was read as high risk, indicating a need for chemotherapy   (7) doxorubicin and cyclophosphamide in dose dense fashion 4  started 07/12/2016, completed 08/23/2016 followed by paclitaxel weekly 12 started 09/06/2016 (received 2 cycles), changed to Abraxane on 09/27/2016 due to reaction to Paclitaxel: Abraxane discontinued after 1 cycle because of the same reaction.  (8) adjuvant radiation 11/01/16-12/19/16 Site/dose:   1. Right  Breast: 50 Gy in 25 fractions                          2. Right Breast Boost: 10 Gy in 5 fractions                          3. Right Breast SCV: 50 Gy in 25 fractions    (9) resumed tamoxifen 12/26/2016, discontinued January 2019 in preparation for anastrozole  (10) goserelin started 05/01/2017, stopped 12/13/2017  (11) status post laparoscopic hysterectomy with bilateral salpingo-oophorectomy 01/04/2018 with benign pathology  (12) anastrozole started 06/19/2017, to be c continue through mid 2024  (a) bone density 10/09/2018 is normal (T score of -0.9)   PLAN: [place] is now 4 years out from definitive surgery with no evidence of disease recurrence.  This is very favorable.  She is tolerating anastrozole well and the plan is to continue it for a total of 5 years.  Adding the about 6 months on tamoxifen that she had before starting anastrozole, that will take her through May 2024  The constipation issue is longstanding.  When she had her hysterectomy she thought it had been solved and had been due to fibroids but apparently not.  She says when she goes to [place] and needs [place] food the constipation goes away.  I have asked her to take MiraLAX and stool softeners daily but stay away from laxatives as much as possible.  She felt that she had difficulty taking a deep breath.  I did not know any deficit and her lung exam but there was some questionable dullness on the right so I set her up for chest x-ray today.  By my reading there is no hemidiaphragm elevation  She will return in 1 year for routine follow-up.  She knows to call for any other issue that may develop before then  Total encounter time 30 minutes.*   Haylynn Pha, Sarajane Jews  C, MD  06/02/20 5:15 PM Medical Oncology and Hematology [facility] [place], [place] [address] Tel. [phone]    Fax. [phone]    I, Wilburn Mylar, am acting as scribe for Dr. Virgie Dad. Krystie Leiter.  I, Lurline Del MD, have reviewed the above documentation for accuracy and completeness, and I agree with the above.   *Total Encounter Time as defined by the Centers for Medicare and Medicaid Services includes, in addition to the face-to-face time of a patient visit (documented in the note above) non-face-to-face time: obtaining and reviewing outside history, ordering and reviewing medications, tests or procedures, care coordination (communications with other health care professionals or caregivers) and documentation in the medical record.

## 2020-06-02 ENCOUNTER — Other Ambulatory Visit: Payer: Self-pay

## 2020-06-02 ENCOUNTER — Ambulatory Visit (HOSPITAL_COMMUNITY)
Admission: RE | Admit: 2020-06-02 | Discharge: 2020-06-02 | Disposition: A | Discharge status: Home / Self Care | Payer: BC Managed Care – PPO | Source: Ambulatory Visit | Attending: Oncology | Admitting: Oncology

## 2020-06-02 ENCOUNTER — Inpatient Hospital Stay: Payer: BC Managed Care – PPO

## 2020-06-02 ENCOUNTER — Inpatient Hospital Stay: Payer: BC Managed Care – PPO | Attending: Oncology | Admitting: Oncology

## 2020-06-02 VITALS — BP 110/71 | HR 82 | Temp 97.7°F | Resp 20 | Ht 64.5 in | Wt 161.8 lb

## 2020-06-02 DIAGNOSIS — C50211 Malignant neoplasm of upper-inner quadrant of right female breast: Secondary | ICD-10-CM | POA: Diagnosis present

## 2020-06-02 DIAGNOSIS — Z90722 Acquired absence of ovaries, bilateral: Secondary | ICD-10-CM | POA: Insufficient documentation

## 2020-06-02 DIAGNOSIS — K5900 Constipation, unspecified: Secondary | ICD-10-CM | POA: Diagnosis not present

## 2020-06-02 DIAGNOSIS — Z170 Estrogen receptor positive status [ER+]: Secondary | ICD-10-CM

## 2020-06-02 DIAGNOSIS — Z923 Personal history of irradiation: Secondary | ICD-10-CM | POA: Insufficient documentation

## 2020-06-02 DIAGNOSIS — Z9221 Personal history of antineoplastic chemotherapy: Secondary | ICD-10-CM | POA: Insufficient documentation

## 2020-06-02 LAB — CMP (CANCER CENTER ONLY)
ALT: 13 U/L (ref 0–44)
AST: 23 U/L (ref 15–41)
Albumin: 4.3 g/dL (ref 3.5–5.0)
Alkaline Phosphatase: 76 U/L (ref 38–126)
Anion gap: 8 (ref 5–15)
BUN: 15 mg/dL (ref 6–20)
CO2: 26 mmol/L (ref 22–32)
Calcium: 9.3 mg/dL (ref 8.9–10.3)
Chloride: 105 mmol/L (ref 98–111)
Creatinine: 0.87 mg/dL (ref 0.44–1.00)
GFR, Estimated: >60 mL/min (ref >60)
Glucose, Bld: 88 mg/dL (ref 70–99)
Potassium: 3.6 mmol/L (ref 3.5–5.1)
Sodium: 139 mmol/L (ref 135–145)
Total Bilirubin: 0.4 mg/dL (ref 0.3–1.2)
Total Protein: 7.6 g/dL (ref 6.5–8.1)

## 2020-06-02 LAB — CBC WITH DIFFERENTIAL (CANCER CENTER ONLY)
Abs Immature Granulocytes: 0 10*3/uL (ref 0.00–0.07)
Basophils Absolute: 0 10*3/uL (ref 0.0–0.1)
Basophils Relative: 1 %
Eosinophils Absolute: 0.1 10*3/uL (ref 0.0–0.5)
Eosinophils Relative: 4 %
HCT: 40.8 % (ref 36.0–46.0)
Hemoglobin: 14.1 g/dL (ref 12.0–15.0)
Immature Granulocytes: 0 %
Lymphocytes Relative: 51 %
Lymphs Abs: 1.9 10*3/uL (ref 0.7–4.0)
MCH: 31.7 pg (ref 26.0–34.0)
MCHC: 34.6 g/dL (ref 30.0–36.0)
MCV: 91.7 fL (ref 80.0–100.0)
Monocytes Absolute: 0.2 10*3/uL (ref 0.1–1.0)
Monocytes Relative: 5 %
Neutro Abs: 1.4 10*3/uL — ABNORMAL LOW (ref 1.7–7.7)
Neutrophils Relative %: 39 %
Platelet Count: 213 10*3/uL (ref 150–400)
RBC: 4.45 MIL/uL (ref 3.87–5.11)
RDW: 11.9 % (ref 11.5–15.5)
WBC Count: 3.7 10*3/uL — ABNORMAL LOW (ref 4.0–10.5)
nRBC: 0 % (ref 0.0–0.2)

## 2020-06-03 ENCOUNTER — Telehealth: Payer: Self-pay | Admitting: Oncology

## 2020-06-03 ENCOUNTER — Encounter: Payer: Self-pay | Admitting: Oncology

## 2020-06-03 NOTE — Telephone Encounter (Signed)
Scheduled appts per 3/8 los. Left voicemail with appt date/time.

## 2021-01-03 ENCOUNTER — Other Ambulatory Visit: Payer: Self-pay | Admitting: Oncology

## 2021-01-03 DIAGNOSIS — Z170 Estrogen receptor positive status [ER+]: Secondary | ICD-10-CM

## 2021-05-07 ENCOUNTER — Telehealth: Payer: Self-pay | Admitting: Hematology and Oncology

## 2021-05-07 NOTE — Telephone Encounter (Signed)
Sch per 2/10 inbasket,pt rq to sch appt, left pt message

## 2021-05-18 ENCOUNTER — Other Ambulatory Visit: Payer: Self-pay

## 2021-05-18 DIAGNOSIS — C50211 Malignant neoplasm of upper-inner quadrant of right female breast: Secondary | ICD-10-CM

## 2021-05-19 ENCOUNTER — Other Ambulatory Visit: Payer: Self-pay

## 2021-05-19 ENCOUNTER — Inpatient Hospital Stay (HOSPITAL_BASED_OUTPATIENT_CLINIC_OR_DEPARTMENT_OTHER): Payer: Medicaid Other | Admitting: Hematology and Oncology

## 2021-05-19 ENCOUNTER — Encounter: Payer: Self-pay | Admitting: Hematology and Oncology

## 2021-05-19 ENCOUNTER — Inpatient Hospital Stay: Payer: Medicaid Other | Attending: Hematology and Oncology

## 2021-05-19 VITALS — BP 120/65 | HR 83 | Temp 98.2°F | Resp 16 | Ht 64.0 in | Wt 161.6 lb

## 2021-05-19 DIAGNOSIS — D708 Other neutropenia: Secondary | ICD-10-CM | POA: Diagnosis not present

## 2021-05-19 DIAGNOSIS — C50211 Malignant neoplasm of upper-inner quadrant of right female breast: Secondary | ICD-10-CM

## 2021-05-19 DIAGNOSIS — Z9221 Personal history of antineoplastic chemotherapy: Secondary | ICD-10-CM | POA: Diagnosis not present

## 2021-05-19 DIAGNOSIS — Z170 Estrogen receptor positive status [ER+]: Secondary | ICD-10-CM

## 2021-05-19 DIAGNOSIS — Z79811 Long term (current) use of aromatase inhibitors: Secondary | ICD-10-CM | POA: Insufficient documentation

## 2021-05-19 DIAGNOSIS — K5909 Other constipation: Secondary | ICD-10-CM

## 2021-05-19 DIAGNOSIS — Z923 Personal history of irradiation: Secondary | ICD-10-CM | POA: Insufficient documentation

## 2021-05-19 LAB — CMP (CANCER CENTER ONLY)
ALT: 15 U/L (ref 0–44)
AST: 27 U/L (ref 15–41)
Albumin: 4.7 g/dL (ref 3.5–5.0)
Alkaline Phosphatase: 65 U/L (ref 38–126)
Anion gap: 5 (ref 5–15)
BUN: 10 mg/dL (ref 6–20)
CO2: 29 mmol/L (ref 22–32)
Calcium: 9.5 mg/dL (ref 8.9–10.3)
Chloride: 104 mmol/L (ref 98–111)
Creatinine: 0.94 mg/dL (ref 0.44–1.00)
GFR, Estimated: >60 mL/min (ref >60)
Glucose, Bld: 88 mg/dL (ref 70–99)
Potassium: 3.5 mmol/L (ref 3.5–5.1)
Sodium: 138 mmol/L (ref 135–145)
Total Bilirubin: 0.5 mg/dL (ref 0.3–1.2)
Total Protein: 7.6 g/dL (ref 6.5–8.1)

## 2021-05-19 LAB — CBC WITH DIFFERENTIAL (CANCER CENTER ONLY)
Abs Immature Granulocytes: 0 10*3/uL (ref 0.00–0.07)
Basophils Absolute: 0 10*3/uL (ref 0.0–0.1)
Basophils Relative: 1 %
Eosinophils Absolute: 0.1 10*3/uL (ref 0.0–0.5)
Eosinophils Relative: 3 %
HCT: 41.4 % (ref 36.0–46.0)
Hemoglobin: 14.2 g/dL (ref 12.0–15.0)
Immature Granulocytes: 0 %
Lymphocytes Relative: 57 %
Lymphs Abs: 2.3 10*3/uL (ref 0.7–4.0)
MCH: 30.8 pg (ref 26.0–34.0)
MCHC: 34.3 g/dL (ref 30.0–36.0)
MCV: 89.8 fL (ref 80.0–100.0)
Monocytes Absolute: 0.3 10*3/uL (ref 0.1–1.0)
Monocytes Relative: 7 %
Neutro Abs: 1.3 10*3/uL — ABNORMAL LOW (ref 1.7–7.7)
Neutrophils Relative %: 32 %
Platelet Count: 232 10*3/uL (ref 150–400)
RBC: 4.61 MIL/uL (ref 3.87–5.11)
RDW: 12.3 % (ref 11.5–15.5)
WBC Count: 4 10*3/uL (ref 4.0–10.5)
nRBC: 0 % (ref 0.0–0.2)

## 2021-05-19 NOTE — Progress Notes (Signed)
[facility]  [phone]) [phone] [phone]) [phone]     ID: Candice [name] DOB: [birth year]  MR#: [id number]  RSW#:[id number]  Patient Care Team: Candice Jordan, MD as PCP - General (Family Medicine) Candice Overall, MD as Consulting Physician (General Surgery) Magrinat, Virgie Dad, MD (Inactive) as Consulting Physician (Oncology) Candice Gibson, MD as Attending Physician (Radiation Oncology) Candice Cockayne, MD as Referring Physician (Surgical Oncology) Candice [name], Candice Massed, NP as Nurse Practitioner (Hematology and Oncology) Candice Caprice, MD (Inactive) as Consulting Physician (Gynecologic Oncology) OTHER MD:  CHIEF COMPLAINT: estrogen receptor positive breast cancer  CURRENT TREATMENT:  anastrozole   INTERVAL HISTORY:  Candice [name] returns today for follow-up of her estrogen receptor positive breast cancer accompanied by her sister.  She continues on anastrozole, with good tolerance.  She however reports ongoing constipation, need to take stool softeners and laxatives every day for the past several months, abdominal pain, loss of fatigue, loss of appetite and bright red blood in stool.  She is worried about the possibility of having breast cancer in her gut.   She has colonoscopy appointment in May 2023.   Her last bone density screening on 10/09/2018 showed a T-score of -0.9, which is considered normal. She denies any changes in her breast.  She has taken tamoxifen for about 6 months prior to surgery. She had routine screening mammography on 05/19/2020 showing a possible abnormality in the left breast. She underwent left diagnostic mammogram at [facility] on 05/30/2020 showing: breast density category C; no evidence of malignancy.  She is due for another mammogram and bone density at this time.  REVIEW OF SYSTEMS:  Rest of the pertinent 10 point ROS reviewed and negative.  COVID 19 VACCINATION STATUS: Status post J&J followed by a Moderna booster  November 2021    BREAST CANCER HISTORY: From the original intake note:  Candice [name] [facility] [place] in December, at which time a mass in the right breast was noted. She was referred for B[facility]CP and screening mammography was obtained, showing a possible mass in the right breast.on 04/21/2016 she underwent right diagnostic mammography with tomography and ultrasonography at the breast Center, and this confirmedan irregular spiculat in the right breast measuring 1.8 cm, which was not palpable to the mammographer. Ultrasonography confirmed an irregular hypoechoic right breast mass at the 12:30 o'clock position 4 cm from the nipple measuring 2.2 cm. The right axilla was sonographically benign.  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  Her subsequent history is detailed below   PAST MEDICAL HISTORY: Past Medical History:  Diagnosis Date   Breast cancer ([facility]) 05/2016   right   Chronic constipation    Family history of cancer    Fibroid, uterine    IBS (irritable bowel syndrome)    IBS - C (constipation)   Personal history of chemotherapy    Personal history of radiation therapy     PAST SURGICAL HISTORY: Past Surgical History:  Procedure Laterality Date   BARTHOLIN CYST MARSUPIALIZATION  09/04/2002   BARTHOLIN GLAND CYST EXCISION Left 07/13/2005   BREAST LUMPECTOMY Right 06/07/2016   BREAST LUMPECTOMY WITH RADIOACTIVE SEED AND SENTINEL LYMPH NODE BIOPSY Right 06/07/2016   Procedure: BREAST LUMPECTOMY WITH RADIOACTIVE SEED AND SENTINEL LYMPH  NODE BIOPSY;  Surgeon: Candice Overall, MD;  Location: [name];  Service: General;  Laterality: Right;   HYSTEROSCOPY  2016   MYOMECTOMY   07/20/2004   Laparotomy   OVARIAN CYST REMOVAL Right 07/20/2004   PORTACATH PLACEMENT N/A 07/05/2016   REMOVED Fall 2018 ; Procedure: INSERTION PORT-A-CATH WITH [place];  Surgeon: Candice Overall, MD;  Location: [facility];  Service: General;  Laterality: N/A;   ROBOTIC ASSISTED TOTAL HYSTERECTOMY WITH BILATERAL SALPINGO OOPHERECTOMY Bilateral 01/04/2018   Procedure: XI ROBOTIC ASSISTED TOTAL HYSTERECTOMY WITH BILATERAL SALPINGO OOPHORECTOMY;  Surgeon: Candice Caprice, MD;  Location: [facility];  Service: Gynecology;  Laterality: Bilateral;    FAMILY HISTORY Family History  Problem Relation Age of Onset   Breast cancer Mother 54   Pancreatic cancer Paternal Grandmother    Prostate cancer Paternal Grandfather    Ovarian cancer Other        MGMs sister  The patient's father, Candice [name], lives in [place] and works as a Geneticist, molecular. The patient's mother is a Quarry manager. The patient has no brothers and 1 sister, who works in [place] as a Network engineer. The patient's mother had breast cancer, stage 0, diagnosed at age 37. There are 2 maternal relatives (mother's sister and mother's niece) with breast cancer, both postmenopausal.   GYNECOLOGIC HISTORY:  Menarche age 63, the patient is GX P0. Patient's last menstrual period was 06/18/2016.   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 She generally lives in [place] by herself although she is currently staying at her parents' in [place].  Both her parents work, her mother in a nursing home and her father for the water department in [place].  Her sister works in [place] as a Network engineer.    ADVANCED DIRECTIVES: Not in place. The patient tells me she intends to name her sister as her healthcare power of attorney.    HEALTH MAINTENANCE: Social History   Tobacco Use   Smoking  status: Never   Smokeless tobacco: Never  Vaping Use   Vaping Use: Never used  Substance Use Topics   Alcohol use: Yes    Alcohol/week: 1.0 standard drink    Types: 1 Glasses of wine per week    Comment: once per month   Drug use: No     Colonoscopy: July 2016, in [place]  PAP:  Bone density: Never   Allergies  Allergen Reactions   Paclitaxel Itching   Amoxicillin Rash    Has patient had a PCN reaction causing immediate rash, facial/tongue/throat swelling, SOB or lightheadedness with hypotension: Yes Has patient had a PCN reaction causing severe rash involving mucus membranes or skin necrosis: Yes Has patient had a PCN reaction that required hospitalization: Yes Has patient had a PCN reaction occurring within the last 10 years: No If all of the above answers are "NO", then may proceed with Cephalosporin use.     Current Outpatient Medications  Medication Sig Dispense Refill   anastrozole (ARIMIDEX) 1 MG tablet TAKE 1 TABLET(1 MG) BY MOUTH DAILY 90 tablet 4   cholecalciferol (VITAMIN D3) 25 MCG (1000 UT) tablet Take 1 tablet (1,000 Units total) by mouth daily. 90 tablet 4   docusate sodium (COLACE) 100 MG capsule Take 1 capsule (100 mg total) by mouth every 12 (twelve) hours. 20 capsule 0   docusate sodium (COLACE) 100 MG capsule Take  2 capsules (200 mg total) by mouth daily with breakfast. 60 capsule 3   etodolac (LODINE) 400 MG tablet Take 400 mg by mouth 2 (two) times daily.     vitamin B-12 (CYANOCOBALAMIN) 100 MCG tablet Take 100 mcg by mouth daily.     naproxen (NAPROSYN) 500 MG tablet Take 1,000 mg by mouth every 12 (twelve) hours as needed for pain. (Patient not taking: Reported on 05/19/2021)     omeprazole (PRILOSEC) 40 MG capsule Take 40 mg by mouth daily.     No current facility-administered medications for this visit.    OBJECTIVE: African-American woman who appears well  Vitals:   05/19/21 1550  BP: 120/65  Pulse: 83  Resp: 16  Temp: 98.2 F (36.8 C)   SpO2: 98%     Body mass index is 27.74 kg/m.    ECOG FS: 1 - Symptomatic but completely ambulatory Filed Weights   05/19/21 1550  Weight: 161 lb 9.6 oz (73.3 kg)    Physical Exam Constitutional:      Appearance: Normal appearance.  HENT:     Head: Normocephalic and atraumatic.  Cardiovascular:     Rate and Rhythm: Normal rate and regular rhythm.  Pulmonary:     Effort: Pulmonary effort is normal.     Breath sounds: Normal breath sounds.  Chest:     Comments: Bilateral breasts inspected.  No palpable masses or regional adenopathy. Abdominal:     General: Abdomen is flat. Bowel sounds are normal.     Palpations: Abdomen is soft.  Skin:    General: Skin is warm and dry.  Neurological:     General: No focal deficit present.     Mental Status: She is alert.  Psychiatric:        Mood and Affect: Mood normal.    LAB RESULTS:  CMP     Component Value Date/Time   NA 138 05/19/2021 1539   NA 142 02/15/2017 1054   K 3.5 05/19/2021 1539   K 4.1 02/15/2017 1054   CL 104 05/19/2021 1539   CO2 29 05/19/2021 1539   CO2 25 02/15/2017 1054   GLUCOSE 88 05/19/2021 1539   GLUCOSE 71 02/15/2017 1054   BUN 10 05/19/2021 1539   BUN 12.3 02/15/2017 1054   CREATININE 0.94 05/19/2021 1539   CREATININE 0.9 02/15/2017 1054   CALCIUM 9.5 05/19/2021 1539   CALCIUM 9.6 02/15/2017 1054   PROT 7.6 05/19/2021 1539   PROT 7.0 02/15/2017 1054   ALBUMIN 4.7 05/19/2021 1539   ALBUMIN 3.8 02/15/2017 1054   AST 27 05/19/2021 1539   AST 25 02/15/2017 1054   ALT 15 05/19/2021 1539   ALT 15 02/15/2017 1054   ALKPHOS 65 05/19/2021 1539   ALKPHOS 62 02/15/2017 1054   BILITOT 0.5 05/19/2021 1539   BILITOT 0.60 02/15/2017 1054   GFRNONAA >60 05/19/2021 1539   GFRAA >60 06/03/2019 1500    INo results found for: SPEP, UPEP  Lab Results  Component Value Date   WBC 4.0 05/19/2021   NEUTROABS 1.3 (L) 05/19/2021   HGB 14.2 05/19/2021   HCT 41.4 05/19/2021   MCV 89.8 05/19/2021   PLT 232  05/19/2021      Chemistry      Component Value Date/Time   NA 138 05/19/2021 1539   NA 142 02/15/2017 1054   K 3.5 05/19/2021 1539   K 4.1 02/15/2017 1054   CL 104 05/19/2021 1539   CO2 29 05/19/2021 1539   CO2 25 02/15/2017  1054   BUN 10 05/19/2021 1539   BUN 12.3 02/15/2017 1054   CREATININE 0.94 05/19/2021 1539   CREATININE 0.9 02/15/2017 1054      Component Value Date/Time   CALCIUM 9.5 05/19/2021 1539   CALCIUM 9.6 02/15/2017 1054   ALKPHOS 65 05/19/2021 1539   ALKPHOS 62 02/15/2017 1054   AST 27 05/19/2021 1539   AST 25 02/15/2017 1054   ALT 15 05/19/2021 1539   ALT 15 02/15/2017 1054   BILITOT 0.5 05/19/2021 1539   BILITOT 0.60 02/15/2017 1054     No results found for: LABCA2  No components found for: LABCA125  No results for input(s): INR in the last 168 hours.  Urinalysis    Component Value Date/Time   COLORURINE STRAW (A) 04/20/2019 1911   APPEARANCEUR CLEAR 04/20/2019 1911   LABSPEC >1.046 (H) 04/20/2019 1911   PHURINE 6.0 04/20/2019 1911   GLUCOSEU NEGATIVE 04/20/2019 1911   HGBUR NEGATIVE 04/20/2019 1911   BILIRUBINUR NEGATIVE 04/20/2019 1911   KETONESUR NEGATIVE 04/20/2019 1911   PROTEINUR NEGATIVE 04/20/2019 1911   NITRITE NEGATIVE 04/20/2019 1911   LEUKOCYTESUR NEGATIVE 04/20/2019 1911    STUDIES: No results found.   ELIGIBLE FOR AVAILABLE RESEARCH PROTOCOL: no  ASSESSMENT: 46 y.o. [place] woman currently residing in [place], status post right breast upper inner quadrant biopsy 04/21/2016 for a clinical T2 N0, stage IIa invasive ductal carcinoma, grade 2, estrogen and progesterone receptor positive, HER-2 nonamplified, with an MIB-1 of 20%  (a) biopsy of 2 additional suspicious areas in the right breast 05/12/2016 was benign  (1) started tamoxifen 04/27/2016, Discontinued at the start of chemotherapy  (2) Oncotype DX obtained from the original biopsy showed a score of 14, predicting a 10 year risk of recurrence  outside the breast of 9% if the patient's only systemic therapy is tamoxifen for 5 years. It also predicts no benefit from chemotherapy.   (3) patient is not interested in fertility preservation  (4) genetics testing 04/27/2016 through the [facility] gene panel offered by [facility] found no deleterious mutations in APC, ATM, BARD1, BMPR1A, BRCA1, BRCA2, BRIP1, CHD1, CDK4, CDKN2A, CHEK2, EPCAM (large rearrangement only), MLH1, MSH2, MSH6, MUTYH, NBN, PALB2, PMS2, PTEN, RAD51C, RAD51D, SMAD4, STK11, and TP53. Sequencing was performed for select regions of POLE and POLD1, and large rearrangement analysis was performed for select regions of GREM1.   (5) status post right lumpectomy with sentinel lymph node sampling 06/07/2016 for a pT1c pN1, stage IB invasive ductal carcinoma, grade 2, with negative margins   (6) Mammaprint sent from the final surgical sample was read as high risk, indicating a need for chemotherapy   (7) doxorubicin and cyclophosphamide in dose dense fashion 4  started 07/12/2016, completed 08/23/2016 followed by paclitaxel weekly 12 started 09/06/2016 (received 2 cycles), changed to Abraxane on 09/27/2016 due to reaction to Paclitaxel: Abraxane discontinued after 1 cycle because of the same reaction.  (8) adjuvant radiation 11/01/16-12/19/16  Site/dose:   1. Right Breast: 50 Gy in 25 fractions                          2. Right Breast Boost: 10 Gy in 5 fractions                          3. Right Breast SCV: 50 Gy in 25 fractions    (9) resumed tamoxifen 12/26/2016, discontinued January 2019 in preparation for anastrozole  (10)  goserelin started 05/01/2017, stopped 12/13/2017  (11) status post laparoscopic hysterectomy with bilateral salpingo-oophorectomy 01/04/2018 with benign pathology  (12) anastrozole started 06/19/2017, to be c continue through mid 2024  (a) bone density 10/09/2018 is normal (T score of -0.9)   PLAN:  Patient is doing quite well on  anastrozole, no major adverse effects reported except for some hot flashes and intermittent night sweats.  She will be doing her mammogram soon and she is also due for a bone density both of these have been ordered. No palpable masses or concerns on exam today. With regards to her abdominal symptoms such as ongoing constipation, bright red blood in stool, abdominal pain, loss of appetite and early satiety, have discussed that she should see a gastroenterologist for further evaluation.  She has seen a physician in [place] gastroenterology or a physician assistant couple years ago, has a follow-up scheduled in May.  We will try to attempt and reach out to them to see if the colonoscopy and evaluation can be expedited. She will return to clinic in 6 months or sooner as needed.  Total encounter time 30 minutes.*   *Total Encounter Time as defined by the Centers for Medicare and Medicaid Services includes, in addition to the face-to-face time of a patient visit (documented in the note above) non-face-to-face time: obtaining and reviewing outside history, ordering and reviewing medications, tests or procedures, care coordination (communications with other health care professionals or caregivers) and documentation in the medical record.

## 2021-05-19 NOTE — Assessment & Plan Note (Signed)
Leukopenia and neutropenia on multiple occasions, stable.  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

## 2021-05-27 ENCOUNTER — Other Ambulatory Visit: Payer: Self-pay | Admitting: Hematology and Oncology

## 2021-05-27 DIAGNOSIS — C50211 Malignant neoplasm of upper-inner quadrant of right female breast: Secondary | ICD-10-CM

## 2021-05-31 ENCOUNTER — Other Ambulatory Visit: Payer: Self-pay | Admitting: Hematology and Oncology

## 2021-05-31 DIAGNOSIS — Z853 Personal history of malignant neoplasm of breast: Secondary | ICD-10-CM

## 2021-06-03 ENCOUNTER — Other Ambulatory Visit: Payer: BC Managed Care – PPO

## 2021-06-03 ENCOUNTER — Ambulatory Visit: Payer: BC Managed Care – PPO | Admitting: Hematology and Oncology

## 2021-06-10 ENCOUNTER — Ambulatory Visit: Payer: BC Managed Care – PPO | Admitting: Hematology and Oncology

## 2021-06-10 ENCOUNTER — Other Ambulatory Visit: Payer: BC Managed Care – PPO

## 2021-06-18 ENCOUNTER — Ambulatory Visit
Admission: RE | Admit: 2021-06-18 | Discharge: 2021-06-18 | Disposition: A | Discharge status: Home / Self Care | Payer: Medicaid Other | Source: Ambulatory Visit | Attending: Hematology and Oncology | Admitting: Hematology and Oncology

## 2021-06-18 DIAGNOSIS — Z853 Personal history of malignant neoplasm of breast: Secondary | ICD-10-CM

## 2021-08-03 ENCOUNTER — Other Ambulatory Visit: Payer: Self-pay | Admitting: Physician Assistant

## 2021-08-03 ENCOUNTER — Ambulatory Visit
Admission: RE | Admit: 2021-08-03 | Discharge: 2021-08-03 | Disposition: A | Discharge status: Home / Self Care | Payer: Medicaid Other | Source: Ambulatory Visit | Attending: Physician Assistant | Admitting: Physician Assistant

## 2021-08-03 DIAGNOSIS — R52 Pain, unspecified: Secondary | ICD-10-CM

## 2021-11-05 ENCOUNTER — Ambulatory Visit
Admission: RE | Admit: 2021-11-05 | Discharge: 2021-11-05 | Disposition: A | Discharge status: Home / Self Care | Payer: Medicaid Other | Source: Ambulatory Visit | Attending: Hematology and Oncology | Admitting: Hematology and Oncology

## 2021-11-05 DIAGNOSIS — Z170 Estrogen receptor positive status [ER+]: Secondary | ICD-10-CM

## 2021-11-16 ENCOUNTER — Other Ambulatory Visit: Payer: Self-pay

## 2021-11-16 ENCOUNTER — Encounter: Payer: Self-pay | Admitting: Hematology and Oncology

## 2021-11-16 ENCOUNTER — Inpatient Hospital Stay: Payer: Medicaid Other | Attending: Hematology and Oncology | Admitting: Hematology and Oncology

## 2021-11-16 VITALS — BP 112/81 | HR 99 | Temp 97.9°F | Resp 18 | Ht 64.0 in | Wt 161.0 lb

## 2021-11-16 DIAGNOSIS — Z79811 Long term (current) use of aromatase inhibitors: Secondary | ICD-10-CM | POA: Diagnosis not present

## 2021-11-16 DIAGNOSIS — C50211 Malignant neoplasm of upper-inner quadrant of right female breast: Secondary | ICD-10-CM | POA: Diagnosis present

## 2021-11-16 DIAGNOSIS — M8580 Other specified disorders of bone density and structure, unspecified site: Secondary | ICD-10-CM | POA: Insufficient documentation

## 2021-11-16 DIAGNOSIS — Z9221 Personal history of antineoplastic chemotherapy: Secondary | ICD-10-CM | POA: Diagnosis not present

## 2021-11-16 DIAGNOSIS — Z170 Estrogen receptor positive status [ER+]: Secondary | ICD-10-CM | POA: Insufficient documentation

## 2021-11-16 DIAGNOSIS — Z79899 Other long term (current) drug therapy: Secondary | ICD-10-CM | POA: Insufficient documentation

## 2021-11-16 DIAGNOSIS — Z923 Personal history of irradiation: Secondary | ICD-10-CM | POA: Insufficient documentation

## 2021-11-16 NOTE — Progress Notes (Signed)
[place]  [phone] [phone]     ID: Candice [name] DOB: [birth year]  MR#: [id number]  HYI#:[id number]  Patient Care Team: Jonathon Jordan, MD as PCP - General (Family Medicine) Alphonsa Overall, MD as Consulting Physician (General Surgery) Magrinat, Virgie Dad, MD (Inactive) as Consulting Physician (Oncology) Eppie Gibson, MD as Attending Physician (Radiation Oncology) Cheryll Cockayne, MD as Referring Physician (Surgical Oncology) Delice Bison, Charlestine Massed, NP as Nurse Practitioner (Hematology and Oncology) Isabel Caprice, MD (Inactive) as Consulting Physician (Gynecologic Oncology) OTHER MD:  CHIEF COMPLAINT: estrogen receptor positive breast cancer  CURRENT TREATMENT:  anastrozole   INTERVAL HISTORY:  Candice [name] returns today for follow-up of her estrogen receptor positive breast cancer accompanied by her sister.  She continues on anastrozole, with good tolerance.   Colonoscopy was negative for any malignancy, patient mentions. She now thinks these symptoms could be from stress. Most recent bone density 11/05/2021 with a T score of -2.0, considered osteopenia. Mammogram from March 2023 with no evidence of malignancy. Rest of the pertinent 10 point ROS reviewed and negative.  COVID 19 VACCINATION STATUS: Status post J&J followed by a Moderna booster November 2021    BREAST CANCER HISTORY: From the original intake note:  Candice [name] [facility] [place] in December, at which time a mass in the right breast was noted. She was referred for [facility]CP and screening mammography was obtained, showing a possible mass in the right breast.on 04/21/2016 she underwent right diagnostic mammography with tomography and ultrasonography at the breast Center, and this confirmedan irregular spiculat in the right breast measuring 1.8 cm, which was not palpable to the mammographer. Ultrasonography confirmed an irregular  hypoechoic right breast mass at the 12:30 o'clock position 4 cm from the nipple measuring 2.2 cm. The right axilla was sonographically benign.  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  Her subsequent history is detailed below   PAST MEDICAL HISTORY: Past Medical History:  Diagnosis Date   Breast cancer ([place]) 05/2016   right   Chronic constipation    Family history of cancer    Fibroid, uterine    IBS (irritable bowel syndrome)    IBS - C (constipation)   Personal history of chemotherapy    Personal history of radiation therapy     PAST SURGICAL HISTORY: Past Surgical History:  Procedure Laterality Date   BARTHOLIN CYST MARSUPIALIZATION  09/04/2002   BARTHOLIN GLAND CYST EXCISION Left 07/13/2005   BREAST LUMPECTOMY Right 06/07/2016   BREAST LUMPECTOMY WITH RADIOACTIVE SEED AND SENTINEL LYMPH NODE BIOPSY Right 06/07/2016   Procedure: BREAST LUMPECTOMY WITH RADIOACTIVE SEED AND SENTINEL LYMPH NODE BIOPSY;  Surgeon: Alphonsa Overall, MD;  Location: [facility];  Service: General;  Laterality: Right;   HYSTEROSCOPY  2016   MYOMECTOMY  07/20/2004   Laparotomy   OVARIAN CYST REMOVAL Right 07/20/2004   PORTACATH PLACEMENT N/A 07/05/2016   REMOVED Fall 2018 ; Procedure: INSERTION PORT-A-CATH WITH [place];  Surgeon: Alphonsa Overall, MD;  Location: [facility];  Service: General;  Laterality: N/A;   ROBOTIC ASSISTED TOTAL HYSTERECTOMY WITH BILATERAL SALPINGO OOPHERECTOMY Bilateral 01/04/2018   Procedure: XI ROBOTIC ASSISTED TOTAL HYSTERECTOMY WITH BILATERAL SALPINGO OOPHORECTOMY;  Surgeon: Isabel Caprice, MD;  Location: [facility];  Service: Gynecology;  Laterality: Bilateral;    FAMILY HISTORY Family History   Problem Relation Age of Onset   Breast cancer Mother 36   Pancreatic cancer Paternal Grandmother    Prostate cancer Paternal Grandfather    Ovarian cancer Other        MGMs sister  The patient's father, [name], lives in [place] and works as a Geneticist, molecular. The patient's mother is a Quarry manager. The patient has no brothers and 1 sister, who works in [place] as a Network engineer. The patient's mother had breast cancer, stage 0, diagnosed at age 47. There are 2 maternal relatives (mother's sister and mother's niece) with breast cancer, both postmenopausal.   GYNECOLOGIC HISTORY:  Menarche age 7, the patient is GX P0. Patient's last menstrual period was 06/18/2016.   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 She generally lives in [place] by herself although she is currently staying at her parents' in [place].  Both her parents work, her mother in a nursing home and her father for the water department in [place].  Her sister works in [place] as a Network engineer.    ADVANCED DIRECTIVES: Not in place. The patient tells me she intends to name her sister as her healthcare power of attorney.    HEALTH MAINTENANCE: Social History   Tobacco Use   Smoking status: Never   Smokeless tobacco: Never  Vaping Use   Vaping Use: Never used  Substance Use Topics   Alcohol use: Yes    Alcohol/week: 1.0 standard drink of alcohol    Types: 1 Glasses of wine per week    Comment: once per month   Drug use: No     Colonoscopy: July 2016, in [place]  PAP:  Bone density: Never   Allergies  Allergen Reactions   Paclitaxel Itching   Amoxicillin Rash    Has patient had a PCN reaction causing immediate rash, facial/tongue/throat swelling, SOB or lightheadedness with hypotension: Yes Has patient had a PCN reaction causing severe rash  involving mucus membranes or skin necrosis: Yes Has patient had a PCN reaction that required hospitalization: Yes Has patient had a PCN reaction occurring within the last 10 years: No If all of the above answers are "NO", then may proceed with Cephalosporin use.     Current Outpatient Medications  Medication Sig Dispense Refill   anastrozole (ARIMIDEX) 1 MG tablet TAKE 1 TABLET(1 MG) BY MOUTH DAILY 90 tablet 4   cholecalciferol (VITAMIN D3) 25 MCG (1000 UT) tablet Take 1 tablet (1,000 Units total) by mouth daily. 90 tablet 4   docusate sodium (COLACE) 100 MG capsule Take 1 capsule (100 mg total) by mouth every 12 (twelve) hours. 20 capsule 0   docusate sodium (COLACE) 100 MG capsule Take 2 capsules (200 mg total) by mouth daily with breakfast. 60 capsule 3   etodolac (LODINE) 400 MG tablet Take 400 mg by mouth 2 (two) times daily.     naproxen (NAPROSYN) 500 MG tablet Take 1,000 mg by mouth every 12 (twelve) hours as needed for pain. (Patient not taking: Reported on 05/19/2021)     omeprazole (PRILOSEC) 40 MG capsule Take 40 mg by mouth daily.     vitamin B-12 (CYANOCOBALAMIN) 100 MCG tablet Take 100 mcg by mouth daily.     No current facility-administered medications for this visit.    OBJECTIVE: African-American  woman who appears well  Vitals:   11/16/21 1538  BP: 112/81  Pulse: 99  Resp: 18  Temp: 97.9 F (36.6 C)  SpO2: 100%     Body mass index is 27.64 kg/m.    ECOG FS: 1 - Symptomatic but completely ambulatory Filed Weights   11/16/21 1538  Weight: 161 lb (73 kg)    Physical Exam Constitutional:      Appearance: Normal appearance.  HENT:     Head: Normocephalic and atraumatic.  Cardiovascular:     Rate and Rhythm: Normal rate and regular rhythm.  Pulmonary:     Effort: Pulmonary effort is normal.     Breath sounds: Normal breath sounds.  Chest:     Comments: Bilateral breasts inspected.  No palpable masses or regional adenopathy. Abdominal:     General: Abdomen  is flat. Bowel sounds are normal.     Palpations: Abdomen is soft.  Skin:    General: Skin is warm and dry.  Neurological:     General: No focal deficit present.     Mental Status: She is alert.  Psychiatric:        Mood and Affect: Mood normal.     LAB RESULTS:  CMP     Component Value Date/Time   NA 138 05/19/2021 1539   NA 142 02/15/2017 1054   K 3.5 05/19/2021 1539   K 4.1 02/15/2017 1054   CL 104 05/19/2021 1539   CO2 29 05/19/2021 1539   CO2 25 02/15/2017 1054   GLUCOSE 88 05/19/2021 1539   GLUCOSE 71 02/15/2017 1054   BUN 10 05/19/2021 1539   BUN 12.3 02/15/2017 1054   CREATININE 0.94 05/19/2021 1539   CREATININE 0.9 02/15/2017 1054   CALCIUM 9.5 05/19/2021 1539   CALCIUM 9.6 02/15/2017 1054   PROT 7.6 05/19/2021 1539   PROT 7.0 02/15/2017 1054   ALBUMIN 4.7 05/19/2021 1539   ALBUMIN 3.8 02/15/2017 1054   AST 27 05/19/2021 1539   AST 25 02/15/2017 1054   ALT 15 05/19/2021 1539   ALT 15 02/15/2017 1054   ALKPHOS 65 05/19/2021 1539   ALKPHOS 62 02/15/2017 1054   BILITOT 0.5 05/19/2021 1539   BILITOT 0.60 02/15/2017 1054   GFRNONAA >60 05/19/2021 1539   GFRAA >60 06/03/2019 1500    INo results found for: "SPEP", "UPEP"  Lab Results  Component Value Date   WBC 4.0 05/19/2021   NEUTROABS 1.3 (L) 05/19/2021   HGB 14.2 05/19/2021   HCT 41.4 05/19/2021   MCV 89.8 05/19/2021   PLT 232 05/19/2021      Chemistry      Component Value Date/Time   NA 138 05/19/2021 1539   NA 142 02/15/2017 1054   K 3.5 05/19/2021 1539   K 4.1 02/15/2017 1054   CL 104 05/19/2021 1539   CO2 29 05/19/2021 1539   CO2 25 02/15/2017 1054   BUN 10 05/19/2021 1539   BUN 12.3 02/15/2017 1054   CREATININE 0.94 05/19/2021 1539   CREATININE 0.9 02/15/2017 1054      Component Value Date/Time   CALCIUM 9.5 05/19/2021 1539   CALCIUM 9.6 02/15/2017 1054   ALKPHOS 65 05/19/2021 1539   ALKPHOS 62 02/15/2017 1054   AST 27 05/19/2021 1539   AST 25 02/15/2017 1054   ALT 15  05/19/2021 1539   ALT 15 02/15/2017 1054   BILITOT 0.5 05/19/2021 1539   BILITOT 0.60 02/15/2017 1054     No results found for: "LABCA2"  No components  found for: "LABCA125"  No results for input(s): "INR" in the last 168 hours.  Urinalysis    Component Value Date/Time   COLORURINE STRAW (A) 04/20/2019 1911   APPEARANCEUR CLEAR 04/20/2019 1911   LABSPEC >1.046 (H) 04/20/2019 1911   PHURINE 6.0 04/20/2019 1911   GLUCOSEU NEGATIVE 04/20/2019 1911   HGBUR NEGATIVE 04/20/2019 1911   BILIRUBINUR NEGATIVE 04/20/2019 1911   KETONESUR NEGATIVE 04/20/2019 1911   PROTEINUR NEGATIVE 04/20/2019 1911   NITRITE NEGATIVE 04/20/2019 1911   LEUKOCYTESUR NEGATIVE 04/20/2019 1911    STUDIES: DG Bone Density  Result Date: 11/05/2021 EXAM: DUAL X-RAY ABSORPTIOMETRY (DXA) FOR BONE MINERAL DENSITY IMPRESSION: Referring Physician:  Benay Pike Your patient completed a bone mineral density test using GE Lunar iDXA system (analysis version: 16). Technologist: [place] PATIENT: Name: Candice [name], Candice [name] Patient ID: [id number] Birth Date: 16-Apr-1975 Height: 64.0 in. Sex: Female Measured: 11/05/2021 Weight: 160.0 lbs. Indications: Bilateral Ovariectomy (65.51), Breast Cancer History, Estrogen Deficient, Hysterectomy, Postmenopausal Fractures: None Treatments: None ASSESSMENT: The BMD measured at Forearm Radius 33% is 0.702 g/cm2 with a T-score of -2.0. This patient is considered osteopenic/low bone mass according to [facility]) criteria. The quality of the exam is good. Site Region Measured Date Measured Age YA BMD Significant CHANGE T-score Left Forearm Radius 33% 11/05/2021 45.3 -2.0 0.702 g/cm2 AP Spine L1-L4 11/05/2021 45.3 -0.1 1.171 g/cm2 * AP Spine L1-L4 10/09/2018 42.3 1.0 1.302 g/cm2 DualFemur Total Left 11/05/2021 45.3 -1.2 0.852 g/cm2 * DualFemur Total Left 10/09/2018 42.3 -0.9 0.888 g/cm2 DualFemur Total Mean 11/05/2021 45.3 -1.2 0.857 g/cm2 * DualFemur Total Mean 10/09/2018 42.3 -0.8  0.906 g/cm2 World Health Organization [facility]) criteria for post-menopausal, Caucasian Women: Normal       T-score at or above -1 SD Osteopenia   T-score between -1 and -2.5 SD Osteoporosis T-score at or below -2.5 SD RECOMMENDATION: 1. All patients should optimize calcium and vitamin D intake. 2. Consider FDA-approved medical therapies in postmenopausal women and men aged 21 years and older, based on the following: a. A hip or vertebral (clinical or morphometric) fracture. b. T-score = -2.5 at the femoral neck or spine after appropriate evaluation to exclude secondary causes. c. Low bone mass (T-score between -1.0 and -2.5 at the femoral neck or spine) and a 10-year probability of a hip fracture = 3% or a 10-year probability of a major osteoporosis-related fracture = 20% based on the [place]-adapted WHO algorithm. d. Clinician judgment and/or patient preferences may indicate treatment for people with 10-year fracture probabilities above or below these levels. FOLLOW-UP: Patients with diagnosis of osteoporosis or at high risk for fracture should have regular bone mineral density tests.? Patients eligible for Medicare are allowed routine testing every 2 years.? The testing frequency can be increased to one year for patients who have rapidly progressing disease, are receiving or discontinuing medical therapy to restore bone mass, or have additional risk factors. I have reviewed this study and agree with the findings. [facility] FRAX* 10-year Probability of Fracture Based on femoral neck BMD: DualFemur (Left) Major Osteoporotic Fracture: 1.2% Hip Fracture:                0.1% Population:                  [place] (Black) Risk Factors:                None *FRAX is a Materials engineer of the [employer] for Metabolic Bone Disease, a [place]  Organization (WHO) [place]. ASSESSMENT: The probability of a major osteoporotic fracture is 1.2 % within the next ten years. The  probability of a hip fracture is 0.1 % within the next ten years. Electronically Signed   By: Dorise Bullion III M.D.   On: 11/05/2021 12:38     ELIGIBLE FOR AVAILABLE RESEARCH PROTOCOL: no  ASSESSMENT: 46 y.o. [place] woman currently residing in [place], status post right breast upper inner quadrant biopsy 04/21/2016 for a clinical T2 N0, stage IIa invasive ductal carcinoma, grade 2, estrogen and progesterone receptor positive, HER-2 nonamplified, with an MIB-1 of 20%  (a) biopsy of 2 additional suspicious areas in the right breast 05/12/2016 was benign  (1) started tamoxifen 04/27/2016, Discontinued at the start of chemotherapy  (2) Oncotype DX obtained from the original biopsy showed a score of 14, predicting a 10 year risk of recurrence outside the breast of 9% if the patient's only systemic therapy is tamoxifen for 5 years. It also predicts no benefit from chemotherapy.   (3) patient is not interested in fertility preservation  (4) genetics testing 04/27/2016 through the [facility] gene panel offered by [facility] found no deleterious mutations in APC, ATM, BARD1, BMPR1A, BRCA1, BRCA2, BRIP1, CHD1, CDK4, CDKN2A, CHEK2, EPCAM (large rearrangement only), MLH1, MSH2, MSH6, MUTYH, NBN, PALB2, PMS2, PTEN, RAD51C, RAD51D, SMAD4, STK11, and TP53. Sequencing was performed for select regions of POLE and POLD1, and large rearrangement analysis was performed for select regions of GREM1.   (5) status post right lumpectomy with sentinel lymph node sampling 06/07/2016 for a pT1c pN1, stage IB invasive ductal carcinoma, grade 2, with negative margins   (6) Mammaprint sent from the final surgical sample was read as high risk, indicating a need for chemotherapy   (7) doxorubicin and cyclophosphamide in dose dense fashion 4  started 07/12/2016, completed 08/23/2016 followed by paclitaxel weekly 12 started 09/06/2016 (received 2 cycles), changed to Abraxane on 09/27/2016 due  to reaction to Paclitaxel: Abraxane discontinued after 1 cycle because of the same reaction.  (8) adjuvant radiation 11/01/16-12/19/16  Site/dose:   1. Right Breast: 50 Gy in 25 fractions                          2. Right Breast Boost: 10 Gy in 5 fractions                          3. Right Breast SCV: 50 Gy in 25 fractions    (9) resumed tamoxifen 12/26/2016, discontinued January 2019 in preparation for anastrozole  (10) goserelin started 05/01/2017, stopped 12/13/2017  (11) status post laparoscopic hysterectomy with bilateral salpingo-oophorectomy 01/04/2018 with benign pathology  (12) anastrozole started 06/19/2017, to be c continue through mid 2024  (a) bone density 10/09/2018 is normal (T score of -0.9)   PLAN:  Patient is doing quite well on anastrozole, no major adverse effects reported except for some hot flashes and intermittent night sweats.   No concerning evidence for recurrence.  Most recent mammogram without any findings. She tells me that she had a colonoscopy since her last visit which was unremarkable, we do not have access to this report. She will continue anastrozole through May 2024 as mentioned above by Dr. Jana Hakim.  We today have discussed her bone density results which show significant worsening of the T score.  I briefly discussed about bisphosphonates which she is not interested in.  She will continue vitamin  D supplementation, calcium as tolerated and weightbearing exercises. Return to clinic in May 2024  Total encounter time 30 minutes.*   *Total Encounter Time as defined by the Centers for Medicare and Medicaid Services includes, in addition to the face-to-face time of a patient visit (documented in the note above) non-face-to-face time: obtaining and reviewing outside history, ordering and reviewing medications, tests or procedures, care coordination (communications with other health care professionals or caregivers) and documentation in the medical  record.

## 2021-11-30 ENCOUNTER — Other Ambulatory Visit: Payer: Self-pay

## 2021-11-30 ENCOUNTER — Ambulatory Visit: Payer: Medicaid Other | Attending: Family Medicine | Admitting: Physical Therapy

## 2021-11-30 DIAGNOSIS — R293 Abnormal posture: Secondary | ICD-10-CM

## 2021-11-30 DIAGNOSIS — K581 Irritable bowel syndrome with constipation: Secondary | ICD-10-CM | POA: Insufficient documentation

## 2021-11-30 DIAGNOSIS — M25511 Pain in right shoulder: Secondary | ICD-10-CM | POA: Diagnosis present

## 2021-11-30 DIAGNOSIS — R279 Unspecified lack of coordination: Secondary | ICD-10-CM

## 2021-11-30 DIAGNOSIS — M6281 Muscle weakness (generalized): Secondary | ICD-10-CM

## 2021-11-30 DIAGNOSIS — M62838 Other muscle spasm: Secondary | ICD-10-CM

## 2021-11-30 DIAGNOSIS — G8929 Other chronic pain: Secondary | ICD-10-CM | POA: Insufficient documentation

## 2021-11-30 NOTE — Patient Instructions (Addendum)
   Balloon breathing: when you feel like you need to strain, instead bring loose fist to mouth and blow out as if blowing a balloon. May do this a few times to assist in relaxation of rectum to empty.

## 2021-11-30 NOTE — Addendum Note (Signed)
Addended by: Junie Panning on: 11/30/2021 04:07 PM   Modules accepted: Orders

## 2021-11-30 NOTE — Therapy (Signed)
OUTPATIENT PHYSICAL THERAPY FEMALE PELVIC EVALUATION   Patient Name: Candice [name] MRN: [id number] DOB:[birth year], 46 y.o., female Today's Date: 11/30/2021   PT End of Session - 11/30/21 1506     Visit Number 1    Date for PT Re-Evaluation 03/01/22    Authorization Type [place] Medicaid    PT Start Time 1450    PT Stop Time 8657    PT Time Calculation (min) 41 min    Activity Tolerance Patient tolerated treatment well    Behavior During Therapy [facility] for tasks assessed/performed             Past Medical History:  Diagnosis Date   Breast cancer ([place]) 05/2016   right   Chronic constipation    Family history of cancer    Fibroid, uterine    IBS (irritable bowel syndrome)    IBS - C (constipation)   Personal history of chemotherapy    Personal history of radiation therapy    Past Surgical History:  Procedure Laterality Date   BARTHOLIN CYST MARSUPIALIZATION  09/04/2002   BARTHOLIN GLAND CYST EXCISION Left 07/13/2005   BREAST LUMPECTOMY Right 06/07/2016   BREAST LUMPECTOMY WITH RADIOACTIVE SEED AND SENTINEL LYMPH NODE BIOPSY Right 06/07/2016   Procedure: BREAST LUMPECTOMY WITH RADIOACTIVE SEED AND SENTINEL LYMPH NODE BIOPSY;  Surgeon: Alphonsa Overall, MD;  Location: [facility];  Service: General;  Laterality: Right;   HYSTEROSCOPY  2016   MYOMECTOMY  07/20/2004   Laparotomy   OVARIAN CYST REMOVAL Right 07/20/2004   PORTACATH PLACEMENT N/A 07/05/2016   REMOVED Fall 2018 ; Procedure: INSERTION PORT-A-CATH WITH [place];  Surgeon: Alphonsa Overall, MD;  Location: [place];  Service: General;  Laterality: N/A;   ROBOTIC ASSISTED TOTAL HYSTERECTOMY WITH BILATERAL SALPINGO OOPHERECTOMY Bilateral 01/04/2018   Procedure: XI ROBOTIC ASSISTED TOTAL HYSTERECTOMY WITH BILATERAL SALPINGO OOPHORECTOMY;  Surgeon: Isabel Caprice, MD;  Location: [facility];  Service: Gynecology;  Laterality: Bilateral;   Patient Active Problem List   Diagnosis Date Noted   Benign  ethnic neutropenia ([place]) 05/19/2021   Globus pharyngeus 09/17/2018   Laryngopharyngeal reflux (LPR) 09/17/2018   Fibroid uterus 01/04/2018   Fibroid, uterine 08/21/2017   Other constipation 09/29/2016   Port catheter in place 09/06/2016   Genetic testing 05/09/2016   Malignant neoplasm of upper-inner quadrant of right breast in female, estrogen receptor positive ([place]) 04/27/2016   Family history of breast cancer    Family history of ovarian cancer    Family history of prostate cancer    Family history of pancreatic cancer     PCP: Jonathon Jordan, MD  REFERRING PROVIDER: Saintclair Halsted, FNP  REFERRING DIAG: [phone] (ICD-10-CM) - Other chronic pain M25.511 (ICD-10-CM) - Pain in right shoulder K58.1 (ICD-10-CM) - Irritable bowel syndrome with constipation  THERAPY DIAG:  Muscle weakness (generalized)  Unspecified lack of coordination  Abnormal posture  Rationale for Evaluation and Treatment Rehabilitation  ONSET DATE: 2016 at least.   SUBJECTIVE:  SUBJECTIVE STATEMENT: Pt reports her biggest complaint today is bil knees. She report tries to work out on her own but her knees are limiting her with bil pain chronically. Pt is status post chemo/radiation treatment for rt breast CA 2018. Has completed all treatment now but prior to diagnosis was training for body building but has felt globally weaker since treatment, stressed and now having constipation issues.   Fluid intake: Yes: 5 x16 oz water per day,      PAIN:  Are you having pain? Yes NPRS scale: 9/10 Pain location:  bil knee pain is worst pain and biggest concern right now  Pain type: "knees feel weak" and achy Pain description: intermittent   Aggravating factors: walking, exercises, prolonged standing Relieving factors: rest,  ice  PRECAUTIONS: Other: post CA treatment  WEIGHT BEARING RESTRICTIONS No  FALLS:  Has patient fallen in last 6 months? 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   PLOF: Independent  PATIENT GOALS to have less knee pain to be able to be more active   PERTINENT HISTORY:  (-) colonoscopy, hysterectomy  estrogen receptor positive breast cancer, Mammogram from March 2023 with no evidence of malignancy, 04/21/2016 she underwent right diagnostic mammography with tomography and ultrasonography and confirmedan irregular spiculat in the right breast measuring 1.8 cm, which was not palpable to the mammographer, 04/21/2016 biopsy of this mass showed ([id number]) and invasive ductal carcinoma, grade 2, estrogen receptor 95% positive, progesterone receptor 100% positive, with strong staining intensity, with an MIB-1 of 20%, and no HER-2 amplification, the signals ratio being 1.71 and the number per cell 3.00.right lumpectomy with sentinel lymph node sampling 06/07/2016 for a pT1c pN1, stage IB invasive ductal carcinoma, grade 2, with negative margins, had chemo and radiation   Sexual abuse: No  BOWEL MOVEMENT Pain with bowel movement: No Type of bowel movement:Type (Bristol Stool Scale) varies sometimes 3-6, Frequency every other to every 3rd day, and Strain Yes Fully empty rectum: Yes: most of the time Leakage: No Pads: No Fiber supplement: Yes: psyllium helps to less, colace   URINATION Pain with urination: No Fully empty bladder: Yes:   Stream: Strong Urgency: No Frequency: not quicker than every 2 hours Leakage:  none Pads: No  INTERCOURSE Pain with intercourse: Initial Penetration and During Penetration Ability to have vaginal penetration:  Yes: but sometimes not painful, does have dryness Climax: yes but has low desire since hysterectomy  Marinoff Scale: 0/3  PREGNANCY Vaginal deliveries  0 Tearing No C-section deliveries 0 Currently pregnant No  PROLAPSE None    OBJECTIVE:   DIAGNOSTIC FINDINGS:  (-) colonoscopy,  Mammogram from March 2023 with no evidence of malignancy,   PATIENT SURVEYS:    PFIQ-7 29 (emotional stress and frustration)   COGNITION:  Overall cognitive status: Within functional limits for tasks assessed     SENSATION:  Light touch: Appears intact  Proprioception: Appears intact  MUSCLE LENGTH: Bil hamstrings and adductors limited by 25%                 POSTURE: rounded shoulders, forward head, and posterior pelvic tilt   LUMBARAROM/PROM  A/PROM A/PROM  eval  Flexion Limited by 25%  Extension WFL  Right lateral flexion Limited by 25%  Left lateral flexion Limited by 25%  Right rotation WFL  Left rotation WFL   (Blank rows = not tested)  LOWER EXTREMITY ROM:  Bil LE WFL however achy pain  reported at end range knee flexion and prolonged end range knee extension  LOWER EXTREMITY MMT:  Bil hip abduction 3+/5, hip flexion 4/5, hip adduction and extension 4/5; knee flexion 4/5 with slight pain per pt, extension 4/5 with slight pain, ankles 5/5 no pain  PALPATION:   General  fascial tightness throughout all quadrants of abdomen, no pain but pt reports she can feel "pressure at upper mid quadrant and lower right quadrant                External Perineal Exam deferred                             Internal Pelvic Floor deferred  Patient confirms identification and approves PT to assess internal pelvic floor and treatment No  PELVIC MMT:   MMT eval  Vaginal   Internal Anal Sphincter   External Anal Sphincter   Puborectalis   Diastasis Recti   (Blank rows = not tested)        TONE: Deferred   PROLAPSE: deferred  TODAY'S TREATMENT  EVAL Examination completed, findings reviewed, pt educated on POC, abdominal massage, and voiding and breathing mechanics. Pt motivated to participate in PT and agreeable to attempt  recommendations.      If treatment provided at initial evaluation, no treatment charged due to lack of authorization.       PATIENT EDUCATION:  Education details: abdominal massage, voiding and breathing mechanics Person educated: Patient Education method: Explanation, Demonstration, Tactile cues, Verbal cues, and Handouts Education comprehension: verbalized understanding and returned demonstration   HOME EXERCISE PROGRAM: abdominal massage, voiding and breathing mechanics. Plan to given formal HEP more specific to knee pain, strengthening, and diaphragmatic breathing   ASSESSMENT:  CLINICAL IMPRESSION: Patient is a 46 y.o. female who was seen today for physical therapy evaluation and treatment for chronic constipation, bil knee pain and Rt shoulder pain. Pt reports her shoulder bothers her sometimes but her biggest complaint in bil knee pains. Pt reports this greatly limits her mobility as she wants to work out, be more active and she knows this will help constipation but due to pain is unable to do what she wants. Of note, pt does have history of Rt breast cancer  pT1c pN1, stage IB invasive ductal carcinoma, grade 2, with negative margins with completion of chemo and radiation since diagnosis in 2018. Recent imaging all (-).  Due to cancer being estrogen (+) she had total hysterectomy as well, since has struggled with decreased sexual desire, vaginal dryness and irritation intermittently as well. Pt reports since this, has had global weakness. Pt found to have decreased flexibility at bil hips, spine, fascial restrictions throughout abdomen, global tension and pt reports history of of jaw clenching and knows she tightens  "everything, all the time" and working on relaxation. Also decreased bil hip and core strength, impaired posture and breathing mechanics with noted chest breathing pattern limited abdominal mobility. Pt educated on abdominal massage, balloon breathing, and voiding mechanics to  address constipation first, pt would benefit from additional PT to further addition constipation and bil knee pain to meet pt goals and improve QOL.    OBJECTIVE IMPAIRMENTS decreased coordination, decreased endurance, decreased mobility, decreased strength, increased fascial restrictions, impaired flexibility, improper body mechanics, postural dysfunction, and pain.   ACTIVITY LIMITATIONS carrying, lifting, bending, standing, squatting, stairs, and locomotion level  PARTICIPATION LIMITATIONS: interpersonal relationship, community activity, yard work, and fitness  PERSONAL FACTORS Time  since onset of injury/illness/exacerbation and 1 comorbidity: medical history  are also affecting patient's functional outcome.   REHAB POTENTIAL: Good  CLINICAL DECISION MAKING: Stable/uncomplicated  EVALUATION COMPLEXITY: Low   GOALS: Goals reviewed with patient? Yes  SHORT TERM GOALS: Target date: 12/28/2021  Pt to be I with HEP.  Baseline: Goal status: INITIAL  2.  Pt to report ability to stand for at least 30 mins with no more than 4/10 bil knee pain to improve QOL.  Baseline:  Goal status: INITIAL  3. Pt will report 25% less abdominal bloating and discomfort due to improved muscle tone throughout the core  Baseline:  Goal status: INITIAL  4.  Pt to be I with abdominal massage and breathing mechanics to improve voiding habits. Baseline:  Goal status: INITIAL   LONG TERM GOALS: Target date:  03/01/22    Pt to be I with advanced HEP.  Baseline:  Goal status: INITIAL  2.  Pt to report ability to walk for at least 30 mins with no more than 2/10 bil knee pain to improve QOL.  Baseline:  Goal status: INITIAL  3.  Pt will report her BMs are complete and occurring at least every other day due to improved bowel habits and evacuation techniques.  Baseline:  Goal status: INITIAL  4.  Pt to demonstrate at least 5/5 bil hip strength for improved pelvic stability and functional squats without  leakage.  Baseline:  Goal status: INITIAL  5.  Pt to tolerate 30 mins of functional strength training within good technique with minimal to know knee pain for improved tolerance to fitness outside of PT and QOL.  Baseline:  Goal status: INITIAL  6.  Pt to demonstrate at least 4/5 pelvic floor strength with ability to fully relax pelvic floor between contractions to improve overall mobility of pelvic floor and voiding mechanics as well as pelvic stability.  Baseline:  Goal status: INITIAL  PLAN: PT FREQUENCY: 2x/week  PT DURATION: 8 weeks  PLANNED INTERVENTIONS: Therapeutic exercises, Therapeutic activity, Neuromuscular re-education, Patient/Family education, Self Care, Joint mobilization, Aquatic Therapy, Dry Needling, Electrical stimulation, Spinal mobilization, Cryotherapy, Moist heat, Compression bandaging, scar mobilization, Taping, Vasopneumatic device, Traction, Ultrasound, Biofeedback, Ionotophoresis 34m/ml Dexamethasone, and Manual therapy  PLAN FOR NEXT SESSION: ortho for bil knee pain (hip and core strengthening, functional strengthening for knees/hips) give HEP for this   HStacy Gardner PT, DPT 09/05/234:03 PM

## 2021-12-03 ENCOUNTER — Ambulatory Visit: Payer: Medicaid Other | Admitting: Physical Therapy

## 2021-12-03 DIAGNOSIS — R279 Unspecified lack of coordination: Secondary | ICD-10-CM

## 2021-12-03 DIAGNOSIS — M6281 Muscle weakness (generalized): Secondary | ICD-10-CM

## 2021-12-03 DIAGNOSIS — G8929 Other chronic pain: Secondary | ICD-10-CM

## 2021-12-03 DIAGNOSIS — M25511 Pain in right shoulder: Secondary | ICD-10-CM | POA: Diagnosis not present

## 2021-12-03 NOTE — Therapy (Signed)
OUTPATIENT PHYSICAL THERAPY PROGRESS NOTE   Patient Name: Candice [name] MRN: [id number] DOB:10-16-1975, 47 y.o., female Today's Date: 12/03/2021   PT End of Session - 12/03/21 1018     Visit Number 2    Date for PT Re-Evaluation 03/01/22    Authorization Type [place] Medicaid    PT Start Time 1018    PT Stop Time 1058    PT Time Calculation (min) 40 min    Activity Tolerance Patient tolerated treatment well             Past Medical History:  Diagnosis Date   Breast cancer ([place]) 05/2016   right   Chronic constipation    Family history of cancer    Fibroid, uterine    IBS (irritable bowel syndrome)    IBS - C (constipation)   Personal history of chemotherapy    Personal history of radiation therapy    Past Surgical History:  Procedure Laterality Date   BARTHOLIN CYST MARSUPIALIZATION  09/04/2002   BARTHOLIN GLAND CYST EXCISION Left 07/13/2005   BREAST LUMPECTOMY Right 06/07/2016   BREAST LUMPECTOMY WITH RADIOACTIVE SEED AND SENTINEL LYMPH NODE BIOPSY Right 06/07/2016   Procedure: BREAST LUMPECTOMY WITH RADIOACTIVE SEED AND SENTINEL LYMPH NODE BIOPSY;  Surgeon: Alphonsa Overall, MD;  Location: [facility];  Service: General;  Laterality: Right;   HYSTEROSCOPY  2016   MYOMECTOMY  07/20/2004   Laparotomy   OVARIAN CYST REMOVAL Right 07/20/2004   PORTACATH PLACEMENT N/A 07/05/2016   REMOVED Fall 2018 ; Procedure: INSERTION PORT-A-CATH WITH [place];  Surgeon: Alphonsa Overall, MD;  Location: [facility];  Service: General;  Laterality: N/A;   ROBOTIC ASSISTED TOTAL HYSTERECTOMY WITH BILATERAL SALPINGO OOPHERECTOMY Bilateral 01/04/2018   Procedure: XI ROBOTIC ASSISTED TOTAL HYSTERECTOMY WITH BILATERAL SALPINGO OOPHORECTOMY;  Surgeon: Isabel Caprice, MD;  Location: [facility];  Service: Gynecology;  Laterality: Bilateral;   Patient Active Problem List   Diagnosis Date Noted   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   Globus pharyngeus 09/17/2018    Laryngopharyngeal reflux (LPR) 09/17/2018   Fibroid uterus 01/04/2018   Fibroid, uterine 08/21/2017   Other constipation 09/29/2016   Port catheter in place 09/06/2016   Genetic testing 05/09/2016   Malignant neoplasm of upper-inner quadrant of right breast in female, estrogen receptor positive ([place]) 04/27/2016   Family history of breast cancer    Family history of ovarian cancer    Family history of prostate cancer    Family history of pancreatic cancer     PCP: Jonathon Jordan, MD  REFERRING PROVIDER: Saintclair Halsted, FNP  REFERRING DIAG: [phone] (ICD-10-CM) - Other chronic pain M25.511 (ICD-10-CM) - Pain in right shoulder K58.1 (ICD-10-CM) - Irritable bowel syndrome with constipation  THERAPY DIAG:  Muscle weakness (generalized)  Unspecified lack of coordination  Chronic pain of both knees  Rationale for Evaluation and Treatment Rehabilitation  ONSET DATE: 2016 at least.   SUBJECTIVE:  SUBJECTIVE STATEMENT:    Bil anterior knee pain.  All day every day.   Working with a Economist to help with stress.  Tense and tight especially in the AM. Going to the pool and doing water ex's.  I go to the gym and do the leg press, knee extension machine and HS curls.  Doing the Elliptical.   PAIN:  Are you having pain? Yes NPRS scale: 7/10 Pain location: bil anterior knees Pain type: "knees feel weak" and achy Pain description: intermittent   Aggravating factors: walking, exercises, prolonged standing Relieving factors: rest, ice  PRECAUTIONS: Other: post CA treatment  WEIGHT BEARING RESTRICTIONS No  FALLS:  Has patient fallen in last 6 months? 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   PLOF:  Independent  PATIENT GOALS to have less knee pain to be able to be more active   PERTINENT HISTORY:  (-) colonoscopy, hysterectomy  estrogen receptor positive breast cancer, Mammogram from March 2023 with no evidence of malignancy, 04/21/2016 she underwent right diagnostic mammography with tomography and ultrasonography and confirmedan irregular spiculat in the right breast measuring 1.8 cm, which was not palpable to the mammographer, 04/21/2016 biopsy of this mass showed ([id number]) and invasive ductal carcinoma, grade 2, estrogen receptor 95% positive, progesterone receptor 100% positive, with strong staining intensity, with an MIB-1 of 20%, and no HER-2 amplification, the signals ratio being 1.71 and the number per cell 3.00.right lumpectomy with sentinel lymph node sampling 06/07/2016 for a pT1c pN1, stage IB invasive ductal carcinoma, grade 2, with negative margins, had chemo and radiation   Sexual abuse: No  BOWEL MOVEMENT Pain with bowel movement: No Type of bowel movement:Type (Bristol Stool Scale) varies sometimes 3-6, Frequency every other to every 3rd day, and Strain Yes Fully empty rectum: Yes: most of the time Leakage: No Pads: No Fiber supplement: Yes: psyllium helps to less, colace   URINATION Pain with urination: No Fully empty bladder: Yes:   Stream: Strong Urgency: No Frequency: not quicker than every 2 hours Leakage:  none Pads: No  INTERCOURSE Pain with intercourse: Initial Penetration and During Penetration Ability to have vaginal penetration:  Yes: but sometimes not painful, does have dryness Climax: yes but has low desire since hysterectomy  Marinoff Scale: 0/3  PREGNANCY Vaginal deliveries 0 Tearing No C-section deliveries 0 Currently pregnant No  PROLAPSE None    OBJECTIVE:   DIAGNOSTIC FINDINGS:  (-) colonoscopy,  Mammogram from March 2023 with no evidence of malignancy,   PATIENT SURVEYS:    PFIQ-7 29 (emotional stress and frustration)    COGNITION:  Overall cognitive status: Within functional limits for tasks assessed     SENSATION:  Light touch: Appears intact  Proprioception: Appears intact  MUSCLE LENGTH: Bil hamstrings and adductors limited by 25%                 POSTURE: rounded shoulders, forward head, and posterior pelvic tilt   LUMBARAROM/PROM  A/PROM A/PROM  eval  Flexion Limited by 25%  Extension WFL  Right lateral flexion Limited by 25%  Left lateral flexion Limited by 25%  Right rotation WFL  Left rotation WFL   (Blank rows = not tested)  LOWER EXTREMITY ROM:  Bil LE WFL however achy pain reported at end range knee flexion and prolonged end range knee extension  LOWER EXTREMITY MMT:  Bil hip abduction 3+/5, hip flexion 4/5, hip adduction and extension 4/5; knee  flexion 4/5 with slight pain per pt, extension 4/5 with slight pain, ankles 5/5 no pain  PALPATION:   General  fascial tightness throughout all quadrants of abdomen, no pain but pt reports she can feel "pressure at upper mid quadrant and lower right quadrant                External Perineal Exam deferred                             Internal Pelvic Floor deferred  Patient confirms identification and approves PT to assess internal pelvic floor and treatment No  PELVIC MMT:   MMT eval  Vaginal   Internal Anal Sphincter   External Anal Sphincter   Puborectalis   Diastasis Recti   (Blank rows = not tested)        TONE: Deferred   PROLAPSE: deferred  TODAY'S TREATMENT  9/8:  Review of current HEP from past PT admissions to correct technique: hip hinge/crouch, avoid compensatory trunk rotation, limit knee valgus/medial collapse Clam with band SLR Standing sidestepping with band at feet and above knees One leg stand to sit 6 and 4 inch step downs (better technique with 4 inch) Hip flexor stretch in 1/2 kneeling and supine Discussed her gym routine and recommended duration and frequency of strengthening  ex      If treatment provided at initial evaluation, no treatment charged due to lack of authorization.       PATIENT EDUCATION:  Education details: abdominal massage, voiding and breathing mechanics Person educated: Patient Education method: Explanation, Demonstration, Tactile cues, Verbal cues, and Handouts Education comprehension: verbalized understanding and returned demonstration   HOME EXERCISE PROGRAM: Access Code: [email or web address] URL: [email or web address] Date: 12/03/2021 Prepared by: Ruben Im  Exercises - Hip Flexor Stretch at [facility] of Bed  - 1 x daily - 7 x weekly - 1 sets - 3 reps - 20 hold - Forward Step Down  - 1 x daily - 7 x weekly - 1 sets - 10 reps - Clamshell with Resistance  - 1 x daily - 7 x weekly - 1 sets - 10 reps abdominal massage, voiding and breathing mechanics. Plan to given formal HEP more specific to knee pain, strengthening, and diaphragmatic breathing   ASSESSMENT:  CLINICAL IMPRESSION: Patient returns for  treatment for bil knee pain (her most bothersome issue) but was also evaluated for pelvic floor PT and Rt shoulder pain.  The patient is well-known to this therapist from prior PT course for LBP and hip in the past.  She is an avid exerciser and was training for body building when she was diagnosed with breast cancer.  She continues to do her previous PT HEP which we reviewed and she was instructed in some modifications and technique changes for knee pain.  We also discussed reducing the duration of ex to 1 hour and performing every other day rather than daily (for LE strengthening).  She would like to focus on right shoulder pain next visit.    OBJECTIVE IMPAIRMENTS decreased coordination, decreased endurance, decreased mobility, decreased strength, increased fascial restrictions, impaired flexibility, improper body mechanics, postural dysfunction, and pain.   ACTIVITY LIMITATIONS carrying, lifting, bending, standing, squatting, stairs,  and locomotion level  PARTICIPATION LIMITATIONS: interpersonal relationship, community activity, yard work, and fitness  PERSONAL FACTORS Time since onset of injury/illness/exacerbation and 1 comorbidity: medical history  are also affecting patient's functional outcome.   REHAB POTENTIAL: Good  CLINICAL DECISION  MAKING: Stable/uncomplicated  EVALUATION COMPLEXITY: Low   GOALS: Goals reviewed with patient? Yes  SHORT TERM GOALS: Target date: 12/28/2021  Pt to be I with HEP.  Baseline: Goal status: INITIAL  2.  Pt to report ability to stand for at least 30 mins with no more than 4/10 bil knee pain to improve QOL.  Baseline:  Goal status: INITIAL  3. Pt will report 25% less abdominal bloating and discomfort due to improved muscle tone throughout the core  Baseline:  Goal status: INITIAL  4.  Pt to be I with abdominal massage and breathing mechanics to improve voiding habits. Baseline:  Goal status: INITIAL   LONG TERM GOALS: Target date:  03/01/22    Pt to be I with advanced HEP.  Baseline:  Goal status: INITIAL  2.  Pt to report ability to walk for at least 30 mins with no more than 2/10 bil knee pain to improve QOL.  Baseline:  Goal status: INITIAL  3.  Pt will report her BMs are complete and occurring at least every other day due to improved bowel habits and evacuation techniques.  Baseline:  Goal status: INITIAL  4.  Pt to demonstrate at least 5/5 bil hip strength for improved pelvic stability and functional squats without leakage.  Baseline:  Goal status: INITIAL  5.  Pt to tolerate 30 mins of functional strength training within good technique with minimal to know knee pain for improved tolerance to fitness outside of PT and QOL.  Baseline:  Goal status: INITIAL  6.  Pt to demonstrate at least 4/5 pelvic floor strength with ability to fully relax pelvic floor between contractions to improve overall mobility of pelvic floor and voiding mechanics as well as  pelvic stability.  Baseline:  Goal status: INITIAL  PLAN: PT FREQUENCY: 2x/week  PT DURATION: 8 weeks  PLANNED INTERVENTIONS: Therapeutic exercises, Therapeutic activity, Neuromuscular re-education, Patient/Family education, Self Care, Joint mobilization, Aquatic Therapy, Dry Needling, Electrical stimulation, Spinal mobilization, Cryotherapy, Moist heat, Compression bandaging, scar mobilization, Taping, Vasopneumatic device, Traction, Ultrasound, Biofeedback, Ionotophoresis 72m/ml Dexamethasone, and Manual therapy  PLAN FOR NEXT SESSION: next visit discuss shoulder ex's;  ortho for bil knee pain (hip and core strengthening, functional strengthening for knees/hips); pelvic floor PT  SRuben Im PT 12/03/21 12:02 PM Phone: [phone]: [phone]

## 2021-12-08 ENCOUNTER — Encounter: Payer: Self-pay | Admitting: Physical Therapy

## 2021-12-08 ENCOUNTER — Ambulatory Visit: Payer: Medicaid Other | Admitting: Physical Therapy

## 2021-12-08 DIAGNOSIS — M6281 Muscle weakness (generalized): Secondary | ICD-10-CM

## 2021-12-08 DIAGNOSIS — M25511 Pain in right shoulder: Secondary | ICD-10-CM | POA: Diagnosis not present

## 2021-12-08 DIAGNOSIS — R293 Abnormal posture: Secondary | ICD-10-CM

## 2021-12-08 NOTE — Therapy (Signed)
OUTPATIENT PHYSICAL THERAPY PROGRESS NOTE   Patient Name: Candice [name] MRN: [id number] DOB:April 04, 1975, 46 y.o., female Today's Date: 12/08/2021   PT End of Session - 12/08/21 1101     Visit Number 3    Date for PT Re-Evaluation 03/01/22    Authorization Type [place] Medicaid    Authorization Time Period 27 VL    PT Start Time 1101    PT Stop Time 6387    PT Time Calculation (min) 44 min    Activity Tolerance Patient tolerated treatment well    Behavior During Therapy WFL for tasks assessed/performed              Past Medical History:  Diagnosis Date   Breast cancer ([place]) 05/2016   right   Chronic constipation    Family history of cancer    Fibroid, uterine    IBS (irritable bowel syndrome)    IBS - C (constipation)   Personal history of chemotherapy    Personal history of radiation therapy    Past Surgical History:  Procedure Laterality Date   BARTHOLIN CYST MARSUPIALIZATION  09/04/2002   BARTHOLIN GLAND CYST EXCISION Left 07/13/2005   BREAST LUMPECTOMY Right 06/07/2016   BREAST LUMPECTOMY WITH RADIOACTIVE SEED AND SENTINEL LYMPH NODE BIOPSY Right 06/07/2016   Procedure: BREAST LUMPECTOMY WITH RADIOACTIVE SEED AND SENTINEL LYMPH NODE BIOPSY;  Surgeon: Alphonsa Overall, MD;  Location: [facility];  Service: General;  Laterality: Right;   HYSTEROSCOPY  2016   MYOMECTOMY  07/20/2004   Laparotomy   OVARIAN CYST REMOVAL Right 07/20/2004   PORTACATH PLACEMENT N/A 07/05/2016   REMOVED Fall 2018 ; Procedure: INSERTION PORT-A-CATH WITH [place];  Surgeon: Alphonsa Overall, MD;  Location: [facility];  Service: General;  Laterality: N/A;   ROBOTIC ASSISTED TOTAL HYSTERECTOMY WITH BILATERAL SALPINGO OOPHERECTOMY Bilateral 01/04/2018   Procedure: XI ROBOTIC ASSISTED TOTAL HYSTERECTOMY WITH BILATERAL SALPINGO OOPHORECTOMY;  Surgeon: Isabel Caprice, MD;  Location: [facility];  Service: Gynecology;  Laterality: Bilateral;   Patient Active Problem List    Diagnosis Date Noted   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   Globus pharyngeus 09/17/2018   Laryngopharyngeal reflux (LPR) 09/17/2018   Fibroid uterus 01/04/2018   Fibroid, uterine 08/21/2017   Other constipation 09/29/2016   Port catheter in place 09/06/2016   Genetic testing 05/09/2016   Malignant neoplasm of upper-inner quadrant of right breast in female, estrogen receptor positive ([place]) 04/27/2016   Family history of breast cancer    Family history of ovarian cancer    Family history of prostate cancer    Family history of pancreatic cancer     PCP: Jonathon Jordan, MD  REFERRING PROVIDER: Saintclair Halsted, FNP  REFERRING DIAG: [phone] (ICD-10-CM) - Other chronic pain M25.511 (ICD-10-CM) - Pain in right shoulder K58.1 (ICD-10-CM) - Irritable bowel syndrome with constipation  THERAPY DIAG:  Muscle weakness (generalized)  Abnormal posture  Rationale for Evaluation and Treatment Rehabilitation  ONSET DATE: 2016 at least.   SUBJECTIVE:  SUBJECTIVE STATEMENT:  Last session helped me understand what I need to do with my knees.  I still don't understand how to have a rest day, yet complete HEP from PT and do my machine exercises.  I need to do weight lifting b/c I am osteopenic.  PAIN:  Are you having pain? Yes NPRS scale: 7/10 Pain location: bil anterior knees Pain type: "knees feel weak" and achy Pain description: intermittent   Aggravating factors: walking, exercises, prolonged standing Relieving factors: rest, ice  PRECAUTIONS: Other: post CA treatment  WEIGHT BEARING RESTRICTIONS No  FALLS:  Has patient fallen in last 6 months? 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   PLOF:  Independent  PATIENT GOALS to have less knee pain to be able to be more active   PERTINENT HISTORY:  (-) colonoscopy, hysterectomy  estrogen receptor positive breast cancer, Mammogram from March 2023 with no evidence of malignancy, 04/21/2016 she underwent right diagnostic mammography with tomography and ultrasonography and confirmedan irregular spiculat in the right breast measuring 1.8 cm, which was not palpable to the mammographer, 04/21/2016 biopsy of this mass showed ([id number]) and invasive ductal carcinoma, grade 2, estrogen receptor 95% positive, progesterone receptor 100% positive, with strong staining intensity, with an MIB-1 of 20%, and no HER-2 amplification, the signals ratio being 1.71 and the number per cell 3.00.right lumpectomy with sentinel lymph node sampling 06/07/2016 for a pT1c pN1, stage IB invasive ductal carcinoma, grade 2, with negative margins, had chemo and radiation   Sexual abuse: No  BOWEL MOVEMENT Pain with bowel movement: No Type of bowel movement:Type (Bristol Stool Scale) varies sometimes 3-6, Frequency every other to every 3rd day, and Strain Yes Fully empty rectum: Yes: most of the time Leakage: No Pads: No Fiber supplement: Yes: psyllium helps to less, colace   URINATION Pain with urination: No Fully empty bladder: Yes:   Stream: Strong Urgency: No Frequency: not quicker than every 2 hours Leakage:  none Pads: No  INTERCOURSE Pain with intercourse: Initial Penetration and During Penetration Ability to have vaginal penetration:  Yes: but sometimes not painful, does have dryness Climax: yes but has low desire since hysterectomy  Marinoff Scale: 0/3  PREGNANCY Vaginal deliveries 0 Tearing No C-section deliveries 0 Currently pregnant No  PROLAPSE None    OBJECTIVE:   DIAGNOSTIC FINDINGS:  (-) colonoscopy,  Mammogram from March 2023 with no evidence of malignancy,   PATIENT SURVEYS:    PFIQ-7 29 (emotional stress and frustration)    COGNITION:  Overall cognitive status: Within functional limits for tasks assessed     SENSATION:  Light touch: Appears intact  Proprioception: Appears intact  MUSCLE LENGTH: Bil hamstrings and adductors limited by 25%                 POSTURE: rounded shoulders, forward head, and posterior pelvic tilt   LUMBARAROM/PROM  A/PROM A/PROM  eval  Flexion Limited by 25%  Extension WFL  Right lateral flexion Limited by 25%  Left lateral flexion Limited by 25%  Right rotation WFL  Left rotation WFL   (Blank rows = not tested)  LOWER EXTREMITY ROM:  Bil LE WFL however achy pain reported at end range knee flexion and prolonged end range knee extension  LOWER EXTREMITY MMT:  Bil hip abduction 3+/5, hip flexion 4/5, hip adduction and extension 4/5; knee flexion 4/5 with slight pain per pt, extension 4/5 with slight pain, ankles 5/5 no  pain  PALPATION:   General  fascial tightness throughout all quadrants of abdomen, no pain but pt reports she can feel "pressure at upper mid quadrant and lower right quadrant                External Perineal Exam deferred                             Internal Pelvic Floor deferred  Patient confirms identification and approves PT to assess internal pelvic floor and treatment No  PELVIC MMT:   MMT eval  Vaginal   Internal Anal Sphincter   External Anal Sphincter   Puborectalis   Diastasis Recti   (Blank rows = not tested)        TONE: Deferred   PROLAPSE: deferred  TODAY'S TREATMENT  9/13: Review of LE HEP from past PT (sidestepping band around knees, stagger stance or single leg sit to stand/stand to sit, hip ext with band resistance, bridge with hip abd - revised for fig 4 bridge) Discussion of focus on UE day/alt with LE day. Discussion of use of aqua jogger in deep water to get cardio in ways other than elliptical which hurts knees after 30 min, incorporate recumbent bike as another alternative For UE focus: work on row, lat  pulldown, bil ER, avoid over extension on rows and bil ER, nothing overhead for now Trial of lat pulldown and low seated row in clinic for form feedback  9/8:  Review of current HEP from past PT admissions to correct technique: hip hinge/crouch, avoid compensatory trunk rotation, limit knee valgus/medial collapse Clam with band SLR Standing sidestepping with band at feet and above knees One leg stand to sit 6 and 4 inch step downs (better technique with 4 inch) Hip flexor stretch in 1/2 kneeling and supine Discussed her gym routine and recommended duration and frequency of strengthening ex      If treatment provided at initial evaluation, no treatment charged due to lack of authorization.       PATIENT EDUCATION:  Education details: abdominal massage, voiding and breathing mechanics Person educated: Patient Education method: Explanation, Demonstration, Tactile cues, Verbal cues, and Handouts Education comprehension: verbalized understanding and returned demonstration   HOME EXERCISE PROGRAM: Access Code: [email or web address] URL: [email or web address] Date: 12/03/2021 Prepared by: Ruben Im  Exercises - Hip Flexor Stretch at [facility] of Bed  - 1 x daily - 7 x weekly - 1 sets - 3 reps - 20 hold - Forward Step Down  - 1 x daily - 7 x weekly - 1 sets - 10 reps - Clamshell with Resistance  - 1 x daily - 7 x weekly - 1 sets - 10 reps abdominal massage, voiding and breathing mechanics. Plan to given formal HEP more specific to knee pain, strengthening, and diaphragmatic breathing   ASSESSMENT:  CLINICAL IMPRESSION: Session spent educating patient on weekly exercise plan to give muscle groups a break in between gym sessions - suggestion to do UE day alternating with LE day at gym, with cardio on UE day to allow muscle recovery and relief for bil knee pain.  Discussed good starting place to start at gym for UE machines given history of chronic shoulder pain on Rt.  Pt may need 1-2  more ortho focused follow up visits if she has any more questions.  Otherwise, pelvic floor treatment next visits.   OBJECTIVE IMPAIRMENTS decreased coordination, decreased endurance, decreased mobility, decreased strength, increased fascial restrictions,  impaired flexibility, improper body mechanics, postural dysfunction, and pain.   ACTIVITY LIMITATIONS carrying, lifting, bending, standing, squatting, stairs, and locomotion level  PARTICIPATION LIMITATIONS: interpersonal relationship, community activity, yard work, and fitness  PERSONAL FACTORS Time since onset of injury/illness/exacerbation and 1 comorbidity: medical history  are also affecting patient's functional outcome.   REHAB POTENTIAL: Good  CLINICAL DECISION MAKING: Stable/uncomplicated  EVALUATION COMPLEXITY: Low   GOALS: Goals reviewed with patient? Yes  SHORT TERM GOALS: Target date: 12/28/2021  Pt to be I with HEP.  Baseline: Goal status: INITIAL  2.  Pt to report ability to stand for at least 30 mins with no more than 4/10 bil knee pain to improve QOL.  Baseline:  Goal status: INITIAL  3. Pt will report 25% less abdominal bloating and discomfort due to improved muscle tone throughout the core  Baseline:  Goal status: INITIAL  4.  Pt to be I with abdominal massage and breathing mechanics to improve voiding habits. Baseline:  Goal status: INITIAL   LONG TERM GOALS: Target date:  03/01/22    Pt to be I with advanced HEP.  Baseline:  Goal status: INITIAL  2.  Pt to report ability to walk for at least 30 mins with no more than 2/10 bil knee pain to improve QOL.  Baseline:  Goal status: INITIAL  3.  Pt will report her BMs are complete and occurring at least every other day due to improved bowel habits and evacuation techniques.  Baseline:  Goal status: INITIAL  4.  Pt to demonstrate at least 5/5 bil hip strength for improved pelvic stability and functional squats without leakage.  Baseline:  Goal status:  INITIAL  5.  Pt to tolerate 30 mins of functional strength training within good technique with minimal to know knee pain for improved tolerance to fitness outside of PT and QOL.  Baseline:  Goal status: INITIAL  6.  Pt to demonstrate at least 4/5 pelvic floor strength with ability to fully relax pelvic floor between contractions to improve overall mobility of pelvic floor and voiding mechanics as well as pelvic stability.  Baseline:  Goal status: INITIAL  PLAN: PT FREQUENCY: 2x/week  PT DURATION: 8 weeks  PLANNED INTERVENTIONS: Therapeutic exercises, Therapeutic activity, Neuromuscular re-education, Patient/Family education, Self Care, Joint mobilization, Aquatic Therapy, Dry Needling, Electrical stimulation, Spinal mobilization, Cryotherapy, Moist heat, Compression bandaging, scar mobilization, Taping, Vasopneumatic device, Traction, Ultrasound, Biofeedback, Ionotophoresis 40m/ml Dexamethasone, and Manual therapy  PLAN FOR NEXT SESSION: next visit discuss shoulder ex's;  ortho for bil knee pain (hip and core strengthening, functional strengthening for knees/hips); pelvic floor PT  Mikah Poss, PT 12/08/21 1:37 PM  Phone: [phone]: [phone]

## 2021-12-28 ENCOUNTER — Ambulatory Visit: Payer: Medicaid Other | Attending: Family Medicine | Admitting: Physical Therapy

## 2021-12-28 DIAGNOSIS — M6281 Muscle weakness (generalized): Secondary | ICD-10-CM | POA: Diagnosis not present

## 2021-12-28 DIAGNOSIS — M62838 Other muscle spasm: Secondary | ICD-10-CM | POA: Diagnosis present

## 2021-12-28 DIAGNOSIS — R279 Unspecified lack of coordination: Secondary | ICD-10-CM | POA: Diagnosis present

## 2021-12-28 NOTE — Therapy (Addendum)
OUTPATIENT PHYSICAL THERAPY PROGRESS NOTE   Patient Name: Candice [name] MRN: [id number] DOB:[birth year], 46 y.o., female Today's Date: 12/28/2021   PT End of Session - 12/28/21 0809     Visit Number 4    Date for PT Re-Evaluation 03/01/22    Authorization Type [place] [employer]    Authorization Time Period 27 VL    PT Start Time 0804   pt arrival time   PT Stop Time 0842    PT Time Calculation (min) 38 min    Activity Tolerance Patient tolerated treatment well    Behavior During Therapy [facility] for tasks assessed/performed              Past Medical History:  Diagnosis Date   Breast cancer ([place]) 05/2016   right   Chronic constipation    Family history of cancer    Fibroid, uterine    IBS (irritable bowel syndrome)    IBS - C (constipation)   Personal history of chemotherapy    Personal history of radiation therapy    Past Surgical History:  Procedure Laterality Date   BARTHOLIN CYST MARSUPIALIZATION  09/04/2002   BARTHOLIN GLAND CYST EXCISION Left 07/13/2005   BREAST LUMPECTOMY Right 06/07/2016   BREAST LUMPECTOMY WITH RADIOACTIVE SEED AND SENTINEL LYMPH NODE BIOPSY Right 06/07/2016   Procedure: BREAST LUMPECTOMY WITH RADIOACTIVE SEED AND SENTINEL LYMPH NODE BIOPSY;  Surgeon: Alphonsa Overall, MD;  Location: [place];  Service: General;  Laterality: Right;   HYSTEROSCOPY  2016   MYOMECTOMY  07/20/2004   Laparotomy   OVARIAN CYST REMOVAL Right 07/20/2004   PORTACATH PLACEMENT N/A 07/05/2016   REMOVED Fall 2018 ; Procedure: INSERTION PORT-A-CATH WITH [place];  Surgeon: Alphonsa Overall, MD;  Location: [place];  Service: General;  Laterality: N/A;   ROBOTIC ASSISTED TOTAL HYSTERECTOMY WITH BILATERAL SALPINGO OOPHERECTOMY Bilateral 01/04/2018   Procedure: XI ROBOTIC ASSISTED TOTAL HYSTERECTOMY WITH BILATERAL SALPINGO OOPHORECTOMY;  Surgeon: Isabel Caprice, MD;  Location: [facility];  Service: Gynecology;  Laterality: Bilateral;   Patient Active  Problem List   Diagnosis Date Noted   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   Globus pharyngeus 09/17/2018   Laryngopharyngeal reflux (LPR) 09/17/2018   Fibroid uterus 01/04/2018   Fibroid, uterine 08/21/2017   Other constipation 09/29/2016   Port catheter in place 09/06/2016   Genetic testing 05/09/2016   Malignant neoplasm of upper-inner quadrant of right breast in female, estrogen receptor positive ([place]) 04/27/2016   Family history of breast cancer    Family history of ovarian cancer    Family history of prostate cancer    Family history of pancreatic cancer     PCP: Jonathon Jordan, MD  REFERRING PROVIDER: Saintclair Halsted, FNP  REFERRING DIAG: [phone] (ICD-10-CM) - Other chronic pain M25.511 (ICD-10-CM) - Pain in right shoulder K58.1 (ICD-10-CM) - Irritable bowel syndrome with constipation  THERAPY DIAG:  Muscle weakness (generalized)  Unspecified lack of coordination  Other muscle spasm  Rationale for Evaluation and Treatment Rehabilitation  ONSET DATE: 2016 at least.   SUBJECTIVE:  SUBJECTIVE STATEMENT:  Reports she got cortisone  shots in both knees a week ago and reports they haven't improved yet but doctor told her of other options and exercises to improve mobility. Pt states she has been walking more for exercise and this helps more than weight lifting with pain. Pt reports she is now having a bowel movement every morning with implementation of fiber, use of squatty potty and increased water intake and mobility. Pt pleased with this.   PAIN:  Are you having pain? Yes NPRS scale: 7/10 Pain location: bil anterior knees Pain type: "knees feel weak" and achy Pain description: intermittent   Aggravating factors: walking, exercises, prolonged standing Relieving factors: rest,  ice  PRECAUTIONS: Other: post CA treatment  WEIGHT BEARING RESTRICTIONS No  FALLS:  Has patient fallen in last 6 months? 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   PLOF: Independent  PATIENT GOALS to have less knee pain to be able to be more active   PERTINENT HISTORY:  (-) colonoscopy, hysterectomy  estrogen receptor positive breast cancer, Mammogram from March 2023 with no evidence of malignancy, 04/21/2016 she underwent right diagnostic mammography with tomography and ultrasonography and confirmedan irregular spiculat in the right breast measuring 1.8 cm, which was not palpable to the mammographer, 04/21/2016 biopsy of this mass showed ([id number]) and invasive ductal carcinoma, grade 2, estrogen receptor 95% positive, progesterone receptor 100% positive, with strong staining intensity, with an MIB-1 of 20%, and no HER-2 amplification, the signals ratio being 1.71 and the number per cell 3.00.right lumpectomy with sentinel lymph node sampling 06/07/2016 for a pT1c pN1, stage IB invasive ductal carcinoma, grade 2, with negative margins, had chemo and radiation   Sexual abuse: No  BOWEL MOVEMENT Pain with bowel movement: No Type of bowel movement:Type (Bristol Stool Scale) varies sometimes 3-6, Frequency every other to every 3rd day, and Strain Yes Fully empty rectum: Yes: most of the time Leakage: No Pads: No Fiber supplement: Yes: psyllium helps to less, colace   URINATION Pain with urination: No Fully empty bladder: Yes:   Stream: Strong Urgency: No Frequency: not quicker than every 2 hours Leakage:  none Pads: No  INTERCOURSE Pain with intercourse: Initial Penetration and During Penetration Ability to have vaginal penetration:  Yes: but sometimes not painful, does have dryness Climax: yes but has low desire since hysterectomy  Marinoff Scale: 0/3  PREGNANCY Vaginal deliveries  0 Tearing No C-section deliveries 0 Currently pregnant No  PROLAPSE None    OBJECTIVE:   DIAGNOSTIC FINDINGS:  (-) colonoscopy,  Mammogram from March 2023 with no evidence of malignancy,   PATIENT SURVEYS:    PFIQ-7 29 (emotional stress and frustration)   COGNITION:  Overall cognitive status: Within functional limits for tasks assessed     SENSATION:  Light touch: Appears intact  Proprioception: Appears intact  MUSCLE LENGTH: Bil hamstrings and adductors limited by 25%                 POSTURE: rounded shoulders, forward head, and posterior pelvic tilt   LUMBARAROM/PROM  A/PROM A/PROM  eval  Flexion Limited by 25%  Extension WFL  Right lateral flexion Limited by 25%  Left lateral flexion Limited by 25%  Right rotation WFL  Left rotation WFL   (Blank rows = not tested)  LOWER EXTREMITY ROM:  Bil LE WFL however achy pain reported at end range knee flexion and prolonged end range knee extension  LOWER EXTREMITY MMT:  Bil hip abduction 3+/5, hip flexion 4/5, hip adduction and extension 4/5; knee flexion 4/5 with slight pain per pt, extension 4/5 with slight pain, ankles 5/5 no pain  PALPATION:   General  fascial tightness throughout all quadrants of abdomen, no pain but pt reports she can feel "pressure at upper mid quadrant and lower right quadrant                External Perineal Exam deferred                             Internal Pelvic Floor deferred  Patient confirms identification and approves PT to assess internal pelvic floor and treatment No  PELVIC MMT:   MMT eval  Vaginal   Internal Anal Sphincter   External Anal Sphincter   Puborectalis   Diastasis Recti   (Blank rows = not tested)        TONE: Deferred   PROLAPSE: deferred  TODAY'S TREATMENT   12/28/21 : Abdominal massage with noted fascial restrictions at Rt and upper quadrants, improved with x10 CW and x10 CCW 3x10 diaphragmatic breathing  X10 diaphragmatic breathing with  ball squeeze for improved coordination Pt educated to complete diaphragmatic breathing and abdominal massage daily for improved abdominal mobility and decreased tension for improved peristalsis and abdominal relaxation  9/13: Review of LE HEP from past PT (sidestepping band around knees, stagger stance or single leg sit to stand/stand to sit, hip ext with band resistance, bridge with hip abd - revised for fig 4 bridge) Discussion of focus on UE day/alt with LE day. Discussion of use of aqua jogger in deep water to get cardio in ways other than elliptical which hurts knees after 30 min, incorporate recumbent bike as another alternative For UE focus: work on row, lat pulldown, bil ER, avoid over extension on rows and bil ER, nothing overhead for now Trial of lat pulldown and low seated row in clinic for form feedback  9/8:  Review of current HEP from past PT admissions to correct technique: hip hinge/crouch, avoid compensatory trunk rotation, limit knee valgus/medial collapse Clam with band SLR Standing sidestepping with band at feet and above knees One leg stand to sit 6 and 4 inch step downs (better technique with 4 inch) Hip flexor stretch in 1/2 kneeling and supine Discussed her gym routine and recommended duration and frequency of strengthening ex      If treatment provided at initial evaluation, no treatment charged due to lack of authorization.       PATIENT EDUCATION:  Education details: abdominal massage, voiding and breathing mechanics Person educated: Patient Education method: Explanation, Demonstration, Tactile cues, Verbal cues, and Handouts Education comprehension: verbalized understanding and returned demonstration   HOME EXERCISE PROGRAM: Access Code: [email or web address] URL: [email or web address] Date: 12/03/2021 Prepared by: Ruben Im  Exercises - Hip Flexor Stretch at [facility] of Bed  - 1 x daily - 7 x weekly - 1 sets - 3 reps - 20 hold - Forward Step Down   - 1 x daily - 7 x weekly - 1 sets - 10 reps - Clamshell with Resistance  - 1 x daily - 7 x weekly - 1 sets - 10 reps abdominal massage, voiding and breathing mechanics. Plan to given formal HEP more specific to knee pain, strengthening, and diaphragmatic breathing   ASSESSMENT:  CLINICAL IMPRESSION: Pt session focused on abdominal massage manual work, NMRE for diaphragmatic breathing training with  max cues and visual demonstrations to improve technique as pt demonstrated chest breathing and has tension throughout rt and upper abdominal quadrants. Pt tolerated well and denied additional questions. Pt would benefit from additional PT to further address deficits.      OBJECTIVE IMPAIRMENTS decreased coordination, decreased endurance, decreased mobility, decreased strength, increased fascial restrictions, impaired flexibility, improper body mechanics, postural dysfunction, and pain.   ACTIVITY LIMITATIONS carrying, lifting, bending, standing, squatting, stairs, and locomotion level  PARTICIPATION LIMITATIONS: interpersonal relationship, community activity, yard work, and fitness  PERSONAL FACTORS Time since onset of injury/illness/exacerbation and 1 comorbidity: medical history  are also affecting patient's functional outcome.   REHAB POTENTIAL: Good  CLINICAL DECISION MAKING: Stable/uncomplicated  EVALUATION COMPLEXITY: Low   GOALS: Goals reviewed with patient? Yes  SHORT TERM GOALS: Target date: 12/28/2021  Pt to be I with HEP.  Baseline: Goal status: MET  2.  Pt to report ability to stand for at least 30 mins with no more than 4/10 bil knee pain to improve QOL.  Baseline:  Goal status: MET  3. Pt will report 25% less abdominal bloating and discomfort due to improved muscle tone throughout the core  Baseline:  Goal status: MET  4.  Pt to be I with abdominal massage and breathing mechanics to improve voiding habits. Baseline:  Goal status: on going   LONG TERM GOALS: Target  date:  03/01/22    Pt to be I with advanced HEP.  Baseline:  Goal status: INITIAL  2.  Pt to report ability to walk for at least 30 mins with no more than 2/10 bil knee pain to improve QOL.  Baseline:  Goal status: INITIAL  3.  Pt will report her BMs are complete and occurring at least every other day due to improved bowel habits and evacuation techniques.  Baseline:  Goal status: INITIAL  4.  Pt to demonstrate at least 5/5 bil hip strength for improved pelvic stability and functional squats without leakage.  Baseline:  Goal status: INITIAL  5.  Pt to tolerate 30 mins of functional strength training within good technique with minimal to know knee pain for improved tolerance to fitness outside of PT and QOL.  Baseline:  Goal status: INITIAL  6.  Pt to demonstrate at least 4/5 pelvic floor strength with ability to fully relax pelvic floor between contractions to improve overall mobility of pelvic floor and voiding mechanics as well as pelvic stability.  Baseline:  Goal status: INITIAL  PLAN: PT FREQUENCY: 2x/week  PT DURATION: 8 weeks  PLANNED INTERVENTIONS: Therapeutic exercises, Therapeutic activity, Neuromuscular re-education, Patient/Family education, Self Care, Joint mobilization, Aquatic Therapy, Dry Needling, Electrical stimulation, Spinal mobilization, Cryotherapy, Moist heat, Compression bandaging, scar mobilization, Taping, Vasopneumatic device, Traction, Ultrasound, Biofeedback, Ionotophoresis 55m/ml Dexamethasone, and Manual therapy  PLAN FOR NEXT SESSION: next visit discuss shoulder ex's;  ortho for bil knee pain (hip and core strengthening, functional strengthening for knees/hips); pelvic floor PT  HStacy Gardner PT, DPT 10/03/238:46 AM  PHYSICAL THERAPY DISCHARGE SUMMARY  Visits from Start of Care: 4  Current functional level related to goals / functional outcomes: Unable to formally reassess, pt requested DC and has not been back since last appointment    Remaining deficits: Unable to formally reassess   Education / Equipment: HEP   Patient agrees to discharge. Patient goals were partially met. Patient is being discharged due to the patient's request.   HStacy Gardner PT, DPT 03/07/2309:33 AM

## 2022-01-04 ENCOUNTER — Ambulatory Visit: Payer: Medicaid Other | Admitting: Physical Therapy

## 2022-01-11 ENCOUNTER — Ambulatory Visit: Payer: Medicaid Other | Admitting: Physical Therapy

## 2022-01-18 ENCOUNTER — Ambulatory Visit: Payer: Medicaid Other | Admitting: Physical Therapy

## 2022-01-21 ENCOUNTER — Other Ambulatory Visit: Payer: Self-pay | Admitting: *Deleted

## 2022-01-21 DIAGNOSIS — C50211 Malignant neoplasm of upper-inner quadrant of right female breast: Secondary | ICD-10-CM

## 2022-01-21 MED ORDER — ANASTROZOLE 1 MG PO TABS: ORAL_TABLET | ORAL | 4 refills | Status: DC

## 2022-01-25 ENCOUNTER — Ambulatory Visit: Payer: Medicaid Other | Admitting: Physical Therapy

## 2022-02-01 ENCOUNTER — Encounter: Payer: Medicaid Other | Admitting: Physical Therapy

## 2022-02-09 ENCOUNTER — Encounter: Payer: Medicaid Other | Admitting: Physical Therapy

## 2022-03-08 ENCOUNTER — Ambulatory Visit: Payer: Medicaid Other | Admitting: Physical Therapy

## 2022-06-23 ENCOUNTER — Ambulatory Visit: Payer: Medicaid Other

## 2022-07-20 ENCOUNTER — Ambulatory Visit
Admission: RE | Admit: 2022-07-20 | Discharge: 2022-07-20 | Disposition: A | Discharge status: Home / Self Care | Payer: Medicaid Other | Source: Ambulatory Visit | Attending: Hematology and Oncology | Admitting: Hematology and Oncology

## 2022-07-20 DIAGNOSIS — Z170 Estrogen receptor positive status [ER+]: Secondary | ICD-10-CM

## 2022-08-30 ENCOUNTER — Inpatient Hospital Stay: Payer: BC Managed Care – PPO | Attending: Hematology and Oncology | Admitting: Hematology and Oncology

## 2022-11-02 ENCOUNTER — Encounter: Payer: Self-pay | Admitting: Family Medicine

## 2022-12-01 ENCOUNTER — Inpatient Hospital Stay: Payer: BC Managed Care – PPO | Attending: Hematology and Oncology | Admitting: Hematology and Oncology

## 2022-12-01 VITALS — BP 106/68 | HR 70 | Temp 98.2°F | Resp 17 | Wt 154.4 lb

## 2022-12-01 DIAGNOSIS — Z923 Personal history of irradiation: Secondary | ICD-10-CM | POA: Diagnosis not present

## 2022-12-01 DIAGNOSIS — Z853 Personal history of malignant neoplasm of breast: Secondary | ICD-10-CM | POA: Diagnosis present

## 2022-12-01 DIAGNOSIS — Z9221 Personal history of antineoplastic chemotherapy: Secondary | ICD-10-CM | POA: Diagnosis not present

## 2022-12-01 DIAGNOSIS — C50211 Malignant neoplasm of upper-inner quadrant of right female breast: Secondary | ICD-10-CM | POA: Diagnosis not present

## 2022-12-01 DIAGNOSIS — Z170 Estrogen receptor positive status [ER+]: Secondary | ICD-10-CM

## 2022-12-01 NOTE — Progress Notes (Signed)
[facility]  [phone] [phone]     ID: Candice [name] DOB: 27-Jan-1976  MR#: [id number]  ZHY#:[id number]  Patient Care Team: Mila Palmer, MD as PCP - General (Family Medicine) Ovidio Kin, MD as Consulting Physician (General Surgery) Magrinat, Valentino Hue, MD (Inactive) as Consulting Physician (Oncology) Lonie Peak, MD as Attending Physician (Radiation Oncology) Durenda Hurt, MD as Referring Physician (Surgical Oncology) Axel Filler, Larna Daughters, NP as Nurse Practitioner (Hematology and Oncology) Shonna Chock, MD (Inactive) as Consulting Physician (Gynecologic Oncology)  CHIEF COMPLAINT: estrogen receptor positive breast cancer  CURRENT TREATMENT:  observation   INTERVAL HISTORY:  Candice [name] returns today for follow-up of her estrogen receptor positive breast cancer accompanied by her sister.  She recently lost her mom to metastatic cancer and she is is coping.  She had been very busy last week with the arrangements.  She is now working at [employer], she has a PhD in Wellsite geologist.   She denies any breast changes. She completed 5 years of anastrozole in May 2024.  Mammogram April 2024 with no evidence of malignancy.  Last bone density in August 2023 T-score of -2.0, osteopenia Rest of the pertinent 10 point ROS reviewed and negative.  COVID 19 VACCINATION STATUS: Status post J&J followed by a Moderna booster November 2021    BREAST CANCER HISTORY: From the original intake note:  Candice [name] [facility] [place] in December, at which time a mass in the right breast was noted. She was referred for [facility] and screening mammography was obtained, showing a possible mass in the right breast.on 04/21/2016 she underwent right diagnostic mammography with tomography and ultrasonography at the breast Center, and this confirmedan irregular spiculat in the right breast measuring 1.8 cm, which was not  palpable to the mammographer. Ultrasonography confirmed an irregular hypoechoic right breast mass at the 12:30 o'clock position 4 cm from the nipple measuring 2.2 cm. The right axilla was sonographically benign.  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  Her subsequent history is detailed below   PAST MEDICAL HISTORY: Past Medical History:  Diagnosis Date   Breast cancer (HCC) 05/2016   right   Chronic constipation    Family history of cancer    Fibroid, uterine    IBS (irritable bowel syndrome)    IBS - C (constipation)   Personal history of chemotherapy    Personal history of radiation therapy     PAST SURGICAL HISTORY: Past Surgical History:  Procedure Laterality Date   BARTHOLIN CYST MARSUPIALIZATION  09/04/2002   BARTHOLIN GLAND CYST EXCISION Left 07/13/2005   BREAST LUMPECTOMY Right 06/07/2016   BREAST LUMPECTOMY WITH RADIOACTIVE SEED AND SENTINEL LYMPH NODE BIOPSY Right 06/07/2016   Procedure: BREAST LUMPECTOMY WITH RADIOACTIVE SEED AND SENTINEL LYMPH NODE BIOPSY;  Surgeon: Ovidio Kin, MD;  Location: [facility];  Service: General;  Laterality: Right;   HYSTEROSCOPY  2016   MYOMECTOMY  07/20/2004   Laparotomy   OVARIAN CYST REMOVAL Right 07/20/2004   PORTACATH PLACEMENT N/A 07/05/2016   REMOVED Fall 2018 ; Procedure: INSERTION PORT-A-CATH WITH [place];  Surgeon: Ovidio Kin, MD;  Location: [facility];  Service: General;  Laterality: N/A;   ROBOTIC ASSISTED TOTAL HYSTERECTOMY WITH BILATERAL SALPINGO  OOPHERECTOMY Bilateral 01/04/2018   Procedure: XI ROBOTIC ASSISTED TOTAL HYSTERECTOMY WITH BILATERAL SALPINGO OOPHORECTOMY;  Surgeon: Shonna Chock, MD;  Location: [facility];  Service: Gynecology;   Laterality: Bilateral;    FAMILY HISTORY Family History  Problem Relation Age of Onset   Breast cancer Mother 27   Pancreatic cancer Paternal Grandmother    Prostate cancer Paternal Grandfather    Ovarian cancer Other        MGMs sister  The patient's father, Candice [name], lives in [place] and works as a Radiation protection practitioner. The patient's mother is a Lawyer. The patient has no brothers and 1 sister, who works in [place] as a Forensic scientist. The patient's mother had breast cancer, stage 0, diagnosed at age 42. There are 2 maternal relatives (mother's sister and mother's niece) with breast cancer, both postmenopausal.   GYNECOLOGIC HISTORY:  Menarche age 47, the patient is GX P0. Patient's last menstrual period was 06/18/2016.   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 She is currently Nurse, mental health at [employer]. She generally lives in [place] by herself although she is currently staying at her parents' in [place].  Both her parents work, her mother in a nursing home and her father for the water department in [place].  Her sister works in [place] as a Forensic scientist.    ADVANCED DIRECTIVES: Not in place. The patient tells me she intends to name her sister as her healthcare power of attorney.    HEALTH MAINTENANCE: Social History   Tobacco Use   Smoking status: Never   Smokeless tobacco: Never  Vaping Use   Vaping status: Never Used  Substance Use Topics   Alcohol use: Yes    Alcohol/week: 1.0 standard drink of alcohol    Types: 1 Glasses of wine per week    Comment: once per month   Drug use: No     Colonoscopy: July 2016, in [place]  PAP:  Bone density: Never   Allergies  Allergen Reactions   Paclitaxel Itching   Amoxicillin Rash    Has patient had a PCN reaction causing immediate rash, facial/tongue/throat swelling, SOB or lightheadedness with  hypotension: Yes Has patient had a PCN reaction causing severe rash involving mucus membranes or skin necrosis: Yes Has patient had a PCN reaction that required hospitalization: Yes Has patient had a PCN reaction occurring within the last 10 years: No If all of the above answers are "NO", then may proceed with Cephalosporin use.     Current Outpatient Medications  Medication Sig Dispense Refill   anastrozole (ARIMIDEX) 1 MG tablet TAKE 1 TABLET(1 MG) BY MOUTH DAILY 90 tablet 4   cholecalciferol (VITAMIN D3) 25 MCG (1000 UT) tablet Take 1 tablet (1,000 Units total) by mouth daily. 90 tablet 4   docusate sodium (COLACE) 100 MG capsule Take 1 capsule (100 mg total) by mouth every 12 (twelve) hours. 20 capsule 0   docusate sodium (COLACE) 100 MG capsule Take 2 capsules (200 mg total) by mouth daily with breakfast. 60 capsule 3   etodolac (LODINE) 400 MG tablet Take 400 mg by mouth 2 (two) times daily.     naproxen (NAPROSYN) 500 MG tablet Take 1,000 mg by mouth every 12 (twelve) hours as needed for pain.     omeprazole (PRILOSEC) 40 MG capsule Take 40 mg by mouth daily.     vitamin B-12 (CYANOCOBALAMIN) 100 MCG tablet Take 100 mcg by  mouth daily.     No current facility-administered medications for this visit.    OBJECTIVE: African-American woman who appears well  Vitals:   12/01/22 1426  BP: 106/68  Pulse: 70  Resp: 17  Temp: 98.2 F (36.8 C)  SpO2: 100%     Body mass index is 26.5 kg/m.    ECOG FS: 1 - Symptomatic but completely ambulatory Filed Weights   12/01/22 1426  Weight: 154 lb 6.4 oz (70 kg)    Physical Exam Constitutional:      Appearance: Normal appearance.  HENT:     Head: Normocephalic and atraumatic.  Cardiovascular:     Rate and Rhythm: Normal rate and regular rhythm.  Pulmonary:     Effort: Pulmonary effort is normal.     Breath sounds: Normal breath sounds.  Chest:     Comments: Bilateral breasts inspected.  No palpable masses or regional  adenopathy. Abdominal:     General: Abdomen is flat. Bowel sounds are normal.     Palpations: Abdomen is soft.  Skin:    General: Skin is warm and dry.  Neurological:     General: No focal deficit present.     Mental Status: She is alert.  Psychiatric:        Mood and Affect: Mood normal.     LAB RESULTS:  CMP     Component Value Date/Time   NA 138 05/19/2021 1539   NA 142 02/15/2017 1054   K 3.5 05/19/2021 1539   K 4.1 02/15/2017 1054   CL 104 05/19/2021 1539   CO2 29 05/19/2021 1539   CO2 25 02/15/2017 1054   GLUCOSE 88 05/19/2021 1539   GLUCOSE 71 02/15/2017 1054   BUN 10 05/19/2021 1539   BUN 12.3 02/15/2017 1054   CREATININE 0.94 05/19/2021 1539   CREATININE 0.9 02/15/2017 1054   CALCIUM 9.5 05/19/2021 1539   CALCIUM 9.6 02/15/2017 1054   PROT 7.6 05/19/2021 1539   PROT 7.0 02/15/2017 1054   ALBUMIN 4.7 05/19/2021 1539   ALBUMIN 3.8 02/15/2017 1054   AST 27 05/19/2021 1539   AST 25 02/15/2017 1054   ALT 15 05/19/2021 1539   ALT 15 02/15/2017 1054   ALKPHOS 65 05/19/2021 1539   ALKPHOS 62 02/15/2017 1054   BILITOT 0.5 05/19/2021 1539   BILITOT 0.60 02/15/2017 1054   GFRNONAA >60 05/19/2021 1539   GFRAA >60 06/03/2019 1500    INo results found for: "SPEP", "UPEP"  Lab Results  Component Value Date   WBC 4.0 05/19/2021   NEUTROABS 1.3 (L) 05/19/2021   HGB 14.2 05/19/2021   HCT 41.4 05/19/2021   MCV 89.8 05/19/2021   PLT 232 05/19/2021      Chemistry      Component Value Date/Time   NA 138 05/19/2021 1539   NA 142 02/15/2017 1054   K 3.5 05/19/2021 1539   K 4.1 02/15/2017 1054   CL 104 05/19/2021 1539   CO2 29 05/19/2021 1539   CO2 25 02/15/2017 1054   BUN 10 05/19/2021 1539   BUN 12.3 02/15/2017 1054   CREATININE 0.94 05/19/2021 1539   CREATININE 0.9 02/15/2017 1054      Component Value Date/Time   CALCIUM 9.5 05/19/2021 1539   CALCIUM 9.6 02/15/2017 1054   ALKPHOS 65 05/19/2021 1539   ALKPHOS 62 02/15/2017 1054   AST 27 05/19/2021  1539   AST 25 02/15/2017 1054   ALT 15 05/19/2021 1539   ALT 15 02/15/2017 1054   BILITOT 0.5  05/19/2021 1539   BILITOT 0.60 02/15/2017 1054     No results found for: "LABCA2"  No components found for: "LABCA125"  No results for input(s): "INR" in the last 168 hours.  Urinalysis    Component Value Date/Time   COLORURINE STRAW (A) 04/20/2019 1911   APPEARANCEUR CLEAR 04/20/2019 1911   LABSPEC >1.046 (H) 04/20/2019 1911   PHURINE 6.0 04/20/2019 1911   GLUCOSEU NEGATIVE 04/20/2019 1911   HGBUR NEGATIVE 04/20/2019 1911   BILIRUBINUR NEGATIVE 04/20/2019 1911   KETONESUR NEGATIVE 04/20/2019 1911   PROTEINUR NEGATIVE 04/20/2019 1911   NITRITE NEGATIVE 04/20/2019 1911   LEUKOCYTESUR NEGATIVE 04/20/2019 1911    STUDIES: No results found.   ELIGIBLE FOR AVAILABLE RESEARCH PROTOCOL: no  ASSESSMENT: 47 y.o. [facility] [place] woman currently residing in [place], status post right breast upper inner quadrant biopsy 04/21/2016 for a clinical T2 N0, stage IIa invasive ductal carcinoma, grade 2, estrogen and progesterone receptor positive, HER-2 nonamplified, with an MIB-1 of 20%  (a) biopsy of 2 additional suspicious areas in the right breast 05/12/2016 was benign  (1) started tamoxifen 04/27/2016, Discontinued at the start of chemotherapy  (2) Oncotype DX obtained from the original biopsy showed a score of 14, predicting a 10 year risk of recurrence outside the breast of 9% if the patient's only systemic therapy is tamoxifen for 5 years. It also predicts no benefit from chemotherapy.   (3) patient is not interested in fertility preservation  (4) genetics testing 04/27/2016 through the [facility] gene panel offered by [facility] found no deleterious mutations in APC, ATM, BARD1, BMPR1A, BRCA1, BRCA2, BRIP1, CHD1, CDK4, CDKN2A, CHEK2, EPCAM (large rearrangement only), MLH1, MSH2, MSH6, MUTYH, NBN, PALB2, PMS2, PTEN, RAD51C, RAD51D, SMAD4, STK11, and TP53.  Sequencing was performed for select regions of POLE and POLD1, and large rearrangement analysis was performed for select regions of GREM1.   (5) status post right lumpectomy with sentinel lymph node sampling 06/07/2016 for a pT1c pN1, stage IB invasive ductal carcinoma, grade 2, with negative margins   (6) Mammaprint sent from the final surgical sample was read as high risk, indicating a need for chemotherapy   (7) doxorubicin and cyclophosphamide in dose dense fashion 4  started 07/12/2016, completed 08/23/2016 followed by paclitaxel weekly 12 started 09/06/2016 (received 2 cycles), changed to Abraxane on 09/27/2016 due to reaction to Paclitaxel: Abraxane discontinued after 1 cycle because of the same reaction.  (8) adjuvant radiation 11/01/16-12/19/16  Site/dose:   1. Right Breast: 50 Gy in 25 fractions                          2. Right Breast Boost: 10 Gy in 5 fractions                          3. Right Breast SCV: 50 Gy in 25 fractions    (9) resumed tamoxifen 12/26/2016, discontinued January 2019 in preparation for anastrozole  (10) goserelin started 05/01/2017, stopped 12/13/2017  (11) status post laparoscopic hysterectomy with bilateral salpingo-oophorectomy 01/04/2018 with benign pathology  (12) anastrozole started 06/19/2017, to be c continue through mid 2024  (a) bone density 10/09/2018 is normal (T score of -0.9)   PLAN:  She is now status post 5 years of anastrozole, this has been discontinued in summer 2024.  No concerning evidence for recurrence.  Most recent mammogram without any findings. We today have once again discussed her bone density results which  show significant worsening of the T score.  I briefly discussed about bisphosphonates which she is not interested in.  She will continue vitamin D supplementation, calcium as tolerated and weightbearing exercises. Return to clinic in 1 year Mammogram and bone density ordered  Total encounter time 30  minutes.*   *Total Encounter Time as defined by the Centers for Medicare and Medicaid Services includes, in addition to the face-to-face time of a patient visit (documented in the note above) non-face-to-face time: obtaining and reviewing outside history, ordering and reviewing medications, tests or procedures, care coordination (communications with other health care professionals or caregivers) and documentation in the medical record.

## 2023-07-21 ENCOUNTER — Ambulatory Visit: Payer: BC Managed Care – PPO

## 2023-07-21 ENCOUNTER — Ambulatory Visit
Admission: RE | Admit: 2023-07-21 | Discharge: 2023-07-21 | Disposition: A | Discharge status: Home / Self Care | Payer: BC Managed Care – PPO | Source: Ambulatory Visit | Attending: Hematology and Oncology | Admitting: Hematology and Oncology

## 2023-07-21 DIAGNOSIS — C50211 Malignant neoplasm of upper-inner quadrant of right female breast: Secondary | ICD-10-CM

## 2023-11-07 ENCOUNTER — Other Ambulatory Visit: Payer: BC Managed Care – PPO

## 2023-11-08 ENCOUNTER — Ambulatory Visit

## 2023-11-08 DIAGNOSIS — C50211 Malignant neoplasm of upper-inner quadrant of right female breast: Secondary | ICD-10-CM

## 2023-11-08 DIAGNOSIS — Z170 Estrogen receptor positive status [ER+]: Secondary | ICD-10-CM | POA: Diagnosis not present

## 2023-11-11 ENCOUNTER — Ambulatory Visit: Payer: Self-pay | Admitting: Hematology and Oncology

## 2023-11-13 NOTE — Telephone Encounter (Signed)
-----   Message from [place] sent at 11/11/2023 11:41 AM EDT ----- Leita I think she is overdue for her annual follow up. Can u talk to her and help with this. ----- Message ----- From: Interface, Rad Results In Sent: 11/10/2023   4:45 AM EDT To: Amber Stalls, MD

## 2023-11-13 NOTE — Telephone Encounter (Signed)
 Called pt per MD and she was offered f/u with Dr Loretha for 11/22/23 at 1345 and accepted. Appt scheduled.

## 2023-11-21 ENCOUNTER — Telehealth: Payer: Self-pay

## 2023-11-21 NOTE — Telephone Encounter (Signed)
 Spoke with patient and confirmed appointment on 8/27

## 2023-11-22 ENCOUNTER — Inpatient Hospital Stay: Attending: Hematology and Oncology | Admitting: Hematology and Oncology

## 2023-11-22 VITALS — BP 119/99 | HR 73 | Temp 98.3°F | Resp 17 | Wt 169.8 lb

## 2023-11-22 DIAGNOSIS — Z170 Estrogen receptor positive status [ER+]: Secondary | ICD-10-CM

## 2023-11-22 DIAGNOSIS — C50211 Malignant neoplasm of upper-inner quadrant of right female breast: Secondary | ICD-10-CM

## 2023-11-22 DIAGNOSIS — Z853 Personal history of malignant neoplasm of breast: Secondary | ICD-10-CM | POA: Diagnosis not present

## 2023-11-22 DIAGNOSIS — M8580 Other specified disorders of bone density and structure, unspecified site: Secondary | ICD-10-CM

## 2023-11-22 NOTE — Progress Notes (Signed)
 [address] [facility]  Telephone:(336) [phone] Fax:(336) [phone]     ID: Ali Denette Shams DOB: 1976/03/13  MR#: [id number]  RDW#:[id number]  Patient Care Team: Verena Mems, MD as PCP - General (Family Medicine) Ethyl Lenis, MD as Consulting Physician (General Surgery) Izell Domino, MD as Attending Physician (Radiation Oncology) Darral Hacker, MD as Referring Physician (Surgical Oncology) Crawford, Morna Pickle, NP as Nurse Practitioner (Hematology and Oncology) Anitra Freddy NOVAK, MD (Inactive) as Consulting Physician (Gynecologic Oncology)  CHIEF COMPLAINT: estrogen receptor positive breast cancer  CURRENT TREATMENT:  observation   INTERVAL HISTORY: Discussed the use of AI scribe software for clinical note transcription with the patient, who gave verbal consent to proceed.  History of Present Illness Candice [name] is a 48 year old female with history of breast cancer,  osteopenia who presents with a stress fracture in the foot.  She has a stress fracture in her foot with pain localized to a specific spot and no preceding trauma. An MRI confirmed the stress fracture. She has a history of osteopenia, with a recent bone density scan showing a T-score of -1.3 in the left femoral neck and right hip, indicating osteopenia, while her lumbar spine T-score is normal at -0.3.  She has experienced chronic constipation since 2016, characterized by infrequent bowel movements and requiring interventions. She has tried various treatments including Miralax and a liquid diet without significant relief. She consumes five bottles of water  daily and follows a vegetarian diet. She reports having had a colonoscopy two to three years ago and was told that things were 'good down there,' but that things were moving very slowly.  Her family history includes her mother having breast cancer, which metastasized to bone cancer. She has a history of breast cancer, which was estrogen  receptor-negative.  Socially, she completed her PhD in 2016 and works as a Dietitian in Information systems manager. She attributes some digestive issues to stress during her PhD and her mother's illness. She exercises as much as her knee condition allows but has reduced activity due to knee pain.   Rest of the pertinent 10 point ROS reviewed and negative.  COVID 19 VACCINATION STATUS: Status post J&J followed by a Moderna booster November 2021    BREAST CANCER HISTORY: From the original intake note:  Grasiela [facility] the [facility] [place] in December, at which time a mass in the right breast was noted. She was referred for BCCCP and screening mammography was obtained, showing a possible mass in the right breast.on 04/21/2016 she underwent right diagnostic mammography with tomography and ultrasonography at the breast Center, and this confirmedan irregular spiculat in the right breast measuring 1.8 cm, which was not palpable to the mammographer. Ultrasonography confirmed an irregular hypoechoic right breast mass at the 12:30 o'clock position 4 cm from the nipple measuring 2.2 cm. The right axilla was sonographically benign.  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  Her subsequent history is detailed below   PAST MEDICAL HISTORY: Past Medical History:  Diagnosis Date   Breast cancer (HCC) 05/2016   right   Chronic constipation    Family history of cancer    Fibroid, uterine    IBS (irritable bowel syndrome)    IBS -  C (constipation)   Personal history of chemotherapy    Personal history of radiation therapy     PAST SURGICAL HISTORY: Past Surgical History:  Procedure Laterality  Date   BARTHOLIN CYST MARSUPIALIZATION  09/04/2002   BARTHOLIN GLAND CYST EXCISION Left 07/13/2005   BREAST LUMPECTOMY Right 06/07/2016   BREAST LUMPECTOMY WITH RADIOACTIVE SEED AND SENTINEL LYMPH NODE BIOPSY Right 06/07/2016   Procedure: BREAST LUMPECTOMY WITH RADIOACTIVE SEED AND SENTINEL LYMPH NODE BIOPSY;  Surgeon: Alm Angle, MD;  Location: [facility];  Service: General;  Laterality: Right;   HYSTEROSCOPY  2016   MYOMECTOMY  07/20/2004   Laparotomy   OVARIAN CYST REMOVAL Right 07/20/2004   PORTACATH PLACEMENT N/A 07/05/2016   REMOVED Fall 2018 ; Procedure: INSERTION PORT-A-CATH WITH US ;  Surgeon: Alm Angle, MD;  Location: [facility];  Service: General;  Laterality: N/A;   ROBOTIC ASSISTED TOTAL HYSTERECTOMY WITH BILATERAL SALPINGO OOPHERECTOMY Bilateral 01/04/2018   Procedure: XI ROBOTIC ASSISTED TOTAL HYSTERECTOMY WITH BILATERAL SALPINGO OOPHORECTOMY;  Surgeon: Anitra Freddy NOVAK, MD;  Location: [facility];  Service: Gynecology;  Laterality: Bilateral;    FAMILY HISTORY Family History  Problem Relation Age of Onset   Breast cancer Mother 35   Pancreatic cancer Paternal Grandmother    Prostate cancer Paternal Grandfather    Ovarian cancer Other        MGMs sister  The patient's father, Jerrell, lives in [place] and works as a Radiation protection practitioner. The patient's mother is a Lawyer. The patient has no brothers and 1 sister, who works in [place] as a Forensic scientist. The patient's mother had breast cancer, stage 0, diagnosed at age 21. There are 2 maternal relatives (mother's sister and mother's niece) with breast cancer, both postmenopausal.   GYNECOLOGIC HISTORY:  Menarche age 62, the patient is GX P0. Patient's last menstrual period was 06/18/2016.   SOCIAL HISTORY: (updated 05/2018) The patient has a PhD degree and has worked for [employer] here and in [place] . She is currently Nurse, mental health at [employer]. She generally  lives in [place] by herself although she is currently staying at her parents' in [place].  Both her parents work, her mother in a nursing home and her father for the water  department in [place].  Her sister works in [place] as a Forensic scientist.    ADVANCED DIRECTIVES: Not in place. The patient tells me she intends to name her sister as her healthcare power of attorney.    HEALTH MAINTENANCE: Social History   Tobacco Use   Smoking status: Never   Smokeless tobacco: Never  Vaping Use   Vaping status: Never Used  Substance Use Topics   Alcohol use: Yes    Alcohol/week: 1.0 standard drink of alcohol    Types: 1 Glasses of wine per week    Comment: once per month   Drug use: No     Colonoscopy: July 2016, in [place]   PAP:  Bone density: Never   Allergies  Allergen Reactions   Paclitaxel  Itching   Amoxicillin Rash    Has patient had a PCN reaction causing immediate rash, facial/tongue/throat swelling, SOB or lightheadedness with hypotension: Yes Has patient had a PCN reaction causing severe rash involving mucus membranes or skin necrosis: Yes Has patient had a PCN reaction that required hospitalization: Yes Has patient had a PCN reaction occurring within the last 10 years: No If all of the above answers are NO, then may proceed with Cephalosporin use.     Current  Outpatient Medications  Medication Sig Dispense Refill   cholecalciferol (VITAMIN D3) 25 MCG (1000 UT) tablet Take 1 tablet (1,000 Units total) by mouth daily. 90 tablet 4   etodolac (LODINE) 400 MG tablet Take 400 mg by mouth 2 (two) times daily.     naproxen (NAPROSYN) 500 MG tablet Take 1,000 mg by mouth every 12 (twelve) hours as needed for pain.     vitamin B-12 (CYANOCOBALAMIN) 100 MCG tablet Take 100 mcg by mouth daily.     anastrozole  (ARIMIDEX ) 1 MG tablet TAKE 1 TABLET(1 MG) BY MOUTH DAILY (Patient not taking: Reported on 11/22/2023) 90 tablet 4   docusate sodium  (COLACE) 100 MG capsule Take 1  capsule (100 mg total) by mouth every 12 (twelve) hours. 20 capsule 0   docusate sodium  (COLACE) 100 MG capsule Take 2 capsules (200 mg total) by mouth daily with breakfast. (Patient not taking: Reported on 11/22/2023) 60 capsule 3   omeprazole (PRILOSEC) 40 MG capsule Take 40 mg by mouth daily. (Patient not taking: Reported on 11/22/2023)     No current facility-administered medications for this visit.    OBJECTIVE: African-American woman who appears well  Vitals:   11/22/23 1354  BP: (!) 119/99  Pulse: 73  Resp: 17  Temp: 98.3 F (36.8 C)  SpO2: 100%     Body mass index is 29.15 kg/m.    ECOG FS: 1 - Symptomatic but completely ambulatory Filed Weights   11/22/23 1354  Weight: 169 lb 12.8 oz (77 kg)    Physical Exam Constitutional:      Appearance: Normal appearance.  HENT:     Head: Normocephalic and atraumatic.  Cardiovascular:     Rate and Rhythm: Normal rate and regular rhythm.  Pulmonary:     Effort: Pulmonary effort is normal.     Breath sounds: Normal breath sounds.  Chest:     Comments: Bilateral breasts inspected.  Scattered density noted. No palpable masses or regional adenopathy. Abdominal:     General: Abdomen is flat. Bowel sounds are normal.     Palpations: Abdomen is soft.  Skin:    General: Skin is warm and dry.  Neurological:     General: No focal deficit present.     Mental Status: She is alert.  Psychiatric:        Mood and Affect: Mood normal.     LAB RESULTS:  CMP     Component Value Date/Time   NA 138 05/19/2021 1539   NA 142 02/15/2017 1054   K 3.5 05/19/2021 1539   K 4.1 02/15/2017 1054   CL 104 05/19/2021 1539   CO2 29 05/19/2021 1539   CO2 25 02/15/2017 1054   GLUCOSE 88 05/19/2021 1539   GLUCOSE 71 02/15/2017 1054   BUN 10 05/19/2021 1539   BUN 12.3 02/15/2017 1054   CREATININE 0.94 05/19/2021 1539   CREATININE 0.9 02/15/2017 1054   CALCIUM 9.5 05/19/2021 1539   CALCIUM 9.6 02/15/2017 1054   PROT 7.6 05/19/2021 1539    PROT 7.0 02/15/2017 1054   ALBUMIN 4.7 05/19/2021 1539   ALBUMIN 3.8 02/15/2017 1054   AST 27 05/19/2021 1539   AST 25 02/15/2017 1054   ALT 15 05/19/2021 1539   ALT 15 02/15/2017 1054   ALKPHOS 65 05/19/2021 1539   ALKPHOS 62 02/15/2017 1054   BILITOT 0.5 05/19/2021 1539   BILITOT 0.60 02/15/2017 1054   GFRNONAA >60 05/19/2021 1539   GFRAA >60 06/03/2019 1500    INo results found for:  SPEP, UPEP  Lab Results  Component Value Date   WBC 4.0 05/19/2021   NEUTROABS 1.3 (L) 05/19/2021   HGB 14.2 05/19/2021   HCT 41.4 05/19/2021   MCV 89.8 05/19/2021   PLT 232 05/19/2021      Chemistry      Component Value Date/Time   NA 138 05/19/2021 1539   NA 142 02/15/2017 1054   K 3.5 05/19/2021 1539   K 4.1 02/15/2017 1054   CL 104 05/19/2021 1539   CO2 29 05/19/2021 1539   CO2 25 02/15/2017 1054   BUN 10 05/19/2021 1539   BUN 12.3 02/15/2017 1054   CREATININE 0.94 05/19/2021 1539   CREATININE 0.9 02/15/2017 1054      Component Value Date/Time   CALCIUM 9.5 05/19/2021 1539   CALCIUM 9.6 02/15/2017 1054   ALKPHOS 65 05/19/2021 1539   ALKPHOS 62 02/15/2017 1054   AST 27 05/19/2021 1539   AST 25 02/15/2017 1054   ALT 15 05/19/2021 1539   ALT 15 02/15/2017 1054   BILITOT 0.5 05/19/2021 1539   BILITOT 0.60 02/15/2017 1054     No results found for: LABCA2  No components found for: LABCA125  No results for input(s): INR in the last 168 hours.  Urinalysis    Component Value Date/Time   COLORURINE STRAW (A) 04/20/2019 1911   APPEARANCEUR CLEAR 04/20/2019 1911   LABSPEC >1.046 (H) 04/20/2019 1911   PHURINE 6.0 04/20/2019 1911   GLUCOSEU NEGATIVE 04/20/2019 1911   HGBUR NEGATIVE 04/20/2019 1911   BILIRUBINUR NEGATIVE 04/20/2019 1911   KETONESUR NEGATIVE 04/20/2019 1911   PROTEINUR NEGATIVE 04/20/2019 1911   NITRITE NEGATIVE 04/20/2019 1911   LEUKOCYTESUR NEGATIVE 04/20/2019 1911    STUDIES: DG Bone Density Result Date: 11/10/2023 EXAM: DUAL X-RAY  ABSORPTIOMETRY (DXA) FOR BONE MINERAL DENSITY 11/08/2023 4:24 pm CLINICAL DATA:  48 year old Female Postmenopausal. Screening for osteoporosis History of fragility fracture. TECHNIQUE: An axial (e.g., hips, spine) and/or appendicular (e.g., radius) exam was performed, as appropriate, using GE Secretary/administrator at [employer]. Images are obtained for bone mineral density measurement and are not obtained for diagnostic purposes. [id number] Exclusions: None. COMPARISON:  None. New baseline. FINDINGS: Scan quality: Good. LUMBAR SPINE (L1-L4): BMD (in g/cm2): 1.145 T-score: -0.3 Z-score: -0.1 LEFT FEMORAL NECK: BMD (in g/cm2): 0.863 T-score: -1.3 Z-score: -0.6 LEFT TOTAL HIP: BMD (in g/cm2): 0.841 T-score: -1.3 Z-score: -1.0 RIGHT FEMORAL NECK: BMD (in g/cm2): 0.926 T-score: -0.8 Z-score: -0.1 RIGHT TOTAL HIP: BMD (in g/cm2): 0.838 T-score: -1.3 Z-score: -1.0 FRAX 10-YEAR PROBABILITY OF FRACTURE: 10-year fracture risk is performed using the [employer] FRAX calculator based on patient-reported risk factors. Major osteoporotic fracture: 3.5% Hip fracture: 0.2% Other situations known to alter the reliability of the FRAX score should be considered when making treatment decisions, including chronic glucocorticoid use and past treatments. Further guidance on treatment can be found at the [facility]oporosis Foundation's website [email or web address]/. IMPRESSION: Osteopenia based on BMD. Fracture risk is increased. Increased risk is based on low BMD, and history of fragility fracture. RECOMMENDATIONS: 1. All patients should optimize calcium and vitamin D  intake. 2. Consider FDA-approved medical therapies in postmenopausal women and men aged 69 years and older, based on the following: - A hip or vertebral (clinical or morphometric) fracture - T-score less than or equal to -2.5 and secondary causes have been excluded. - Low bone mass (T-score between -1.0 and -2.5) and a 10-year probability of a hip  fracture greater than or equal to 3%  or a 10-year probability of a major osteoporosis-related fracture greater than or equal to 20% based on the [place] -adapted WHO algorithm. - Clinician judgment and/or patient preferences may indicate treatment for people with 10-year fracture probabilities above or below these levels 3. Patients with diagnosis of osteoporosis or at high risk for fracture should have regular bone mineral density tests. For patients eligible for Medicare, routine testing is allowed once every 2 years. The testing frequency can be increased to one year for patients who have rapidly progressing disease, those who are receiving or discontinuing medical therapy to restore bone mass, or have additional risk factors. Electronically Signed   By: Dina  Arceo M.D.   On: 11/10/2023 04:42     ELIGIBLE FOR AVAILABLE RESEARCH PROTOCOL: no  ASSESSMENT: 48 y.o. [place]  woman currently residing in [place], status post right breast upper inner quadrant biopsy 04/21/2016 for a clinical T2 N0, stage IIa invasive ductal carcinoma, grade 2, estrogen and progesterone receptor positive, HER-2 nonamplified, with an MIB-1 of 20%  (a) biopsy of 2 additional suspicious areas in the right breast 05/12/2016 was benign  (1) started tamoxifen  04/27/2016, Discontinued at the start of chemotherapy  (2) Oncotype DX obtained from the original biopsy showed a score of 14, predicting a 10 year risk of recurrence outside the breast of 9% if the patient's only systemic therapy is tamoxifen  for 5 years. It also predicts no benefit from chemotherapy.   (3) patient is not interested in fertility preservation  (4) genetics testing 04/27/2016 through the MyRisk gene panel offered by [facility] found no deleterious mutations in APC, ATM, BARD1, BMPR1A, BRCA1, BRCA2, BRIP1, CHD1, CDK4, CDKN2A, CHEK2, EPCAM (large rearrangement only), MLH1, MSH2, MSH6, MUTYH, NBN, PALB2, PMS2, PTEN, RAD51C,  RAD51D, SMAD4, STK11, and TP53. Sequencing was performed for select regions of POLE and POLD1, and large rearrangement analysis was performed for select regions of GREM1.   (5) status post right lumpectomy with sentinel lymph node sampling 06/07/2016 for a pT1c pN1, stage IB invasive ductal carcinoma, grade 2, with negative margins   (6) Mammaprint sent from the final surgical sample was read as high risk, indicating a need for chemotherapy   (7) doxorubicin  and cyclophosphamide  in dose dense fashion 4  started 07/12/2016, completed 08/23/2016 followed by paclitaxel  weekly 12 started 09/06/2016 (received 2 cycles), changed to Abraxane  on 09/27/2016 due to reaction to Paclitaxel : Abraxane  discontinued after 1 cycle because of the same reaction.  (8) adjuvant radiation 11/01/16-12/19/16  Site/dose:   1. Right Breast: 50 Gy in 25 fractions                          2. Right Breast Boost: 10 Gy in 5 fractions                          3. Right Breast SCV: 50 Gy in 25 fractions    (9) resumed tamoxifen  12/26/2016, discontinued January 2019 in preparation for anastrozole   (10) goserelin started 05/01/2017, stopped 12/13/2017  (11) status post laparoscopic hysterectomy with bilateral salpingo-oophorectomy 01/04/2018 with benign pathology  (12) anastrozole  started 06/19/2017, to be c continue through mid 2024  (a) bone density 10/09/2018 is normal (T score of -0.9)   PLAN:  Assessment and Plan Assessment & Plan   Osteopenia with recent stress fracture of foot Osteopenia confirmed by bone density scan with T scores of -1.3 in the left femoral neck and right hip.  Stress fracture in foot with unclear etiology.  - Refer to endocrinologist for metabolic bone disorder evaluation. - Advise weight-bearing exercises and resistance training post-healing of stress fracture. - Recommend vitamin D  supplementation  Chronic constipation Chronic constipation since 2016, possibly stress-related.  Ineffective treatment with Miralax and Linzess. High water  intake and vegetarian diet maintained. Colonoscopy showed slow transit, no structural issues. - Suggest yoga for stress management and bowel movement improvement. - Encourage high water  intake and fiber-rich diet.  History of breast cancer History of breast cancer with no current evidence of disease. Dense breast tissue noted, not indicative of recurrence. Discussed rare possibility of recurrence as bone cancer.  FU in 1 yr or sooner as needed.  Total encounter time 30 minutes.*   *Total Encounter Time as defined by the Centers for Medicare and Medicaid Services includes, in addition to the face-to-face time of a patient visit (documented in the note above) non-face-to-face time: obtaining and reviewing outside history, ordering and reviewing medications, tests or procedures, care coordination (communications with other health care professionals or caregivers) and documentation in the medical record.

## 2023-11-23 ENCOUNTER — Ambulatory Visit: Admitting: Hematology and Oncology

## 2024-02-05 ENCOUNTER — Other Ambulatory Visit: Payer: Self-pay | Admitting: Sports Medicine

## 2024-02-05 DIAGNOSIS — M79604 Pain in right leg: Secondary | ICD-10-CM

## 2024-02-07 ENCOUNTER — Other Ambulatory Visit

## 2024-02-26 ENCOUNTER — Ambulatory Visit: Admitting: Endocrinology

## 2024-02-26 ENCOUNTER — Encounter: Payer: Self-pay | Admitting: Endocrinology

## 2024-02-26 VITALS — BP 132/80 | HR 77 | Resp 16 | Ht 64.0 in | Wt 173.6 lb

## 2024-02-26 DIAGNOSIS — E559 Vitamin D deficiency, unspecified: Secondary | ICD-10-CM | POA: Diagnosis not present

## 2024-02-26 DIAGNOSIS — Z1329 Encounter for screening for other suspected endocrine disorder: Secondary | ICD-10-CM

## 2024-02-26 DIAGNOSIS — M8589 Other specified disorders of bone density and structure, multiple sites: Secondary | ICD-10-CM

## 2024-02-26 DIAGNOSIS — M84361A Stress fracture, right tibia, initial encounter for fracture: Secondary | ICD-10-CM | POA: Diagnosis not present

## 2024-02-26 NOTE — Patient Instructions (Signed)
 Lab in the morning  fasting.  Start calcium 500 mg two times a day with meals.

## 2024-02-26 NOTE — Progress Notes (Signed)
 Outpatient Endocrinology Note Cyndal Kasson, MD   Patient's Name: Onisha Cedeno    DOB: 09/02/1975    MRN: [id number]  REASON OF VISIT: New consult for osteopenia  REFERRING PROVIDER: Arnaldo Juliene RAMAN, MD  PCP: Verena Mems, MD  HISTORY OF PRESENT ILLNESS:   Ralph Benavidez is a 48 y.o. old female with past medical history listed below, is here for new consult of bone health issues /osteopenia in the setting of stress fracture of tibia.  Pertinent Bone Health History: Patient is referred to endocrinology for the evaluation and management of osteopenia in the setting of stress fracture of right tibia in August 2025, being evaluated treated by sports medicine.  Patient had right leg pain was initially evaluated in urgent care and later followed by sports medicine, she reports x-ray did not show fracture however MRI right lower extremity showed stress fracture of tibia nondisplaced.  Patient had surgical menopause in 2019, had a history of breast cancer status post hysterectomy with bilateral salpingo-oophorectomy in August 2019 at the age of 42-year.  Patient was not treated with estrogen hormone therapy.  Patient had DEXA scan in February 2020 with normal bone mineral density.  She had DEXA scan in August 2023 with osteopenia with lowest T-score of -2.0 at left distal forearm and T-score of -1.2 at total left femur.  DEXA scan in August 2025 consistent with osteopenia with lowest T-score of -1.3 at right total hip, left total hip and left femoral neck.  FRAX score 10-year probability of major osteoporotic fracture 3.5% and hip fracture 0.2%.  Not able to compare bone mineral density from prior DEXA scans in 2020 and 2023 as they were on in different machines and different location.  Bone Health Concerns:  No history of smoking.  No excessive alcohol intake.  No glucocorticoid use or antiseizure medication.  She does not take PPIs.  No history of fragility fracture.  No  known malabsorption syndrome, liver disease, kidney stone.  She exercises Doxylin.  She regularly exercises in the gym with cardio with elliptical.  Menopause: At the age of 54, surgical menopause.  She has history of breast cancer diagnosed in 2018, status postchemotherapy.  Status post radiotherapy in 2018 to the right breast.  Status post hysterectomy with bilateral salpingo-oophorectomy in 2019.  She was treated with anastrozole  from 2019 until mid of 2024.  Fracture history: -No fragility fracture.  Stress fracture of right tibia in August 2025 confirmed with MRI.  Osteoporosis medications: None  Secondary workup for osteoporosis: None  Calcium intake from supplements: None Dietary calcium intake: Minimal. Current vitamin D  intake: 0 international units daily.  She had taken vitamin D  supplement in the past.  Relevant comorbidities: No history of bone cancer. No history of external radiation treatment to the breast in 2018, has history of breast cancer.   No history of diabetes mellitus.  No Rheumatoid arthritis. No history of GERD.  She has constipation. No history of recent invasive dental procedures. Not planning dental procedures in the near future.  Interval history  Initial visit today.  Patient presented for evaluation and management of osteopenia.  Patient currently not taking calcium and vitamin D  supplement.  She used to take vitamin D  in the past.  She had stress fracture of right tibia in August 2025 confirmed with MRI.  She denies trauma to the leg.  She used to be a runner when she was in [place] and lately she exercises regularly in the gym with cardio with  elliptical.  She denies significant running when she had complaints of right leg pain.  REVIEW OF SYSTEMS:  As per history of present illness.   PAST MEDICAL HISTORY: Past Medical History:  Diagnosis Date   Breast cancer (HCC) 05/2016   right   Chronic constipation    Family history of cancer    Fibroid,  uterine    IBS (irritable bowel syndrome)    IBS - C (constipation)   Personal history of chemotherapy    Personal history of radiation therapy     PAST SURGICAL HISTORY: Past Surgical History:  Procedure Laterality Date   BARTHOLIN CYST MARSUPIALIZATION  09/04/2002   BARTHOLIN GLAND CYST EXCISION Left 07/13/2005   BREAST LUMPECTOMY Right 06/07/2016   BREAST LUMPECTOMY WITH RADIOACTIVE SEED AND SENTINEL LYMPH NODE BIOPSY Right 06/07/2016   Procedure: BREAST LUMPECTOMY WITH RADIOACTIVE SEED AND SENTINEL LYMPH NODE BIOPSY;  Surgeon: Alm Angle, MD;  Location: [facility];  Service: General;  Laterality: Right;   HYSTEROSCOPY  2016   MYOMECTOMY  07/20/2004   Laparotomy   OVARIAN CYST REMOVAL Right 07/20/2004   PORTACATH PLACEMENT N/A 07/05/2016   REMOVED Fall 2018 ; Procedure: INSERTION PORT-A-CATH WITH US ;  Surgeon: Alm Angle, MD;  Location: [facility];  Service: General;  Laterality: N/A;   ROBOTIC ASSISTED TOTAL HYSTERECTOMY WITH BILATERAL SALPINGO OOPHERECTOMY Bilateral 01/04/2018   Procedure: XI ROBOTIC ASSISTED TOTAL HYSTERECTOMY WITH BILATERAL SALPINGO OOPHORECTOMY;  Surgeon: Anitra Freddy NOVAK, MD;  Location: [facility];  Service: Gynecology;  Laterality: Bilateral;    ALLERGIES: Allergies  Allergen Reactions   Paclitaxel  Itching   Amoxicillin Rash    Has patient had a PCN reaction causing immediate rash, facial/tongue/throat swelling, SOB or lightheadedness with hypotension: Yes Has patient had a PCN reaction causing severe rash involving mucus membranes or skin necrosis: Yes Has patient had a PCN reaction that required hospitalization: Yes Has patient had a PCN reaction occurring within the last 10 years: No If all of the above answers are NO, then may proceed with Cephalosporin use.     FAMILY HISTORY:  Family History  Problem Relation Age of Onset   Breast cancer Mother 13   Pancreatic cancer Paternal Grandmother    Prostate cancer  Paternal Grandfather    Ovarian cancer Other        MGMs sister    SOCIAL HISTORY: Social History   Socioeconomic History   Marital status: Single    Spouse name: Not on file   Number of children: Not on file   Years of education: Not on file   Highest education level: Not on file  Occupational History   Not on file  Tobacco Use   Smoking status: Never   Smokeless tobacco: Never  Vaping Use   Vaping status: Never Used  Substance and Sexual Activity   Alcohol use: Yes    Alcohol/week: 1.0 standard drink of alcohol    Types: 1 Glasses of wine per week    Comment: once per month   Drug use: No   Sexual activity: Not Currently    Partners: Male    Birth control/protection: None  Other Topics Concern   Not on file  Social History Narrative   Not on file   Social Drivers of Health   Financial Resource Strain: Not on file  Food Insecurity: Not on file  Transportation Needs: Not on file  Physical Activity: Not on file  Stress: Not on file  Social Connections: Not on file  MEDICATIONS:  Current Outpatient Medications  Medication Sig Dispense Refill   cholecalciferol (VITAMIN D3) 25 MCG (1000 UT) tablet Take 1 tablet (1,000 Units total) by mouth daily. 90 tablet 4   etodolac (LODINE) 400 MG tablet Take 400 mg by mouth 2 (two) times daily. (Patient taking differently: Take 400 mg by mouth 2 (two) times daily. As needed)     naproxen (NAPROSYN) 500 MG tablet Take 1,000 mg by mouth every 12 (twelve) hours as needed for pain.     vitamin B-12 (CYANOCOBALAMIN) 100 MCG tablet Take 100 mcg by mouth daily.     No current facility-administered medications for this visit.    PHYSICAL EXAM: Vitals:   02/26/24 1538  BP: 132/80  Pulse: 77  Resp: 16  SpO2: 98%  Weight: 173 lb 9.6 oz (78.7 kg)  Height: 5' 4 (1.626 m)   Body mass index is 29.8 kg/m.  Wt Readings from Last 3 Encounters:  02/26/24 173 lb 9.6 oz (78.7 kg)  11/22/23 169 lb 12.8 oz (77 kg)  12/01/22 154 lb  6.4 oz (70 kg)    General: Well developed, well nourished female in no apparent distress.  HEENT: AT/[place], no external lesions. Hearing intact to the spoken word Eyes: EOMI. Conjunctiva clear and no icterus. Neck: Trachea midline, neck supple without appreciable thyromegaly or lymphadenopathy and no palpable thyroid nodules Lungs: Clear to auscultation, no wheeze. Respirations not labored Heart: S1S2, Regular in rate and rhythm. Abdomen: Soft, non tender, non distended Neurologic: Alert, oriented, normal speech, deep tendon biceps reflexes normal,  no gross focal neurological deficit Extremities: No pedal pitting edema, no tremors of outstretched hands. No spine tenderness Skin: Warm, color good.  Psychiatric: Does not appear depressed or anxious  PERTINENT HISTORIC LABORATORY AND IMAGING STUDIES:  All pertinent laboratory results were reviewed. Please see HPI also for further details.   Lab Results  Component Value Date   ALKPHOS 65 05/19/2021   ALKPHOS 76 06/02/2020   ALKPHOS 113 06/03/2019     ASSESSMENT / PLAN  1. Osteopenia of multiple sites   2. Thyroid disorder screening   3. Stress fracture of right tibia, initial encounter   4. Vitamin D  deficiency    Patient has osteopenia based on DEXA scan in August 2025 and also in DEXA scan August 2023, lowest T-score of -1.3 in bilateral total hip and left femoral neck.  She had early surgical menopause at the age of 47 years.  She has history of breast cancer treated with chemotherapy and radiotherapy including anastrozole  from 2019 until mid of 2024.  No other obvious risk factors for bone loss/osteoporosis.  Patient had stress fracture of right tibia confirmed with MRI.  Stress fracture of the leg is not considered as fragility fracture due to osteoporosis.  It is usually related to repeated hip trauma.  She denies trauma or excessive running however she does gym exercise with cardio on elliptical.  Recent DEXA scan in August 2025  consistent with osteopenia, lowest T-score of - 1.3 and acceptable FRAX score.  Plan: - Based on osteopenia with acceptable FRAX score and DEXA scan with bone mineral density no indication of antiresorptive therapy at this time. - Stress fracture is not considered as fragility fracture to have clinical diagnosis of osteoporosis.  However will be cautious about risk of fragility fracture. - I would like to check bone turnover marker called CTX.  If CTX is significantly elevated, we will plan for antiresorptive therapy.  Otherwise we will optimize bone health  with optimal calcium and vitamin D  intake. - Check parathyroid hormone, renal function panel and thyroid function test to evaluate for bone health. - Advised to take calcium 500 mg 2 times a day. -Recheck vitamin D  level and we will decide on vitamin D  supplement based on the test result. -Discussed about weightbearing exercise in general for bone health however advised to discuss with sports medicine in regard to weightbearing and exercise in the context of stress fracture of tibia. - Discussed about fall precautions.  Diagnoses and all orders for this visit:  Osteopenia of multiple sites -     Renal function panel -     Parathyroid hormone, intact (no Ca) -     VITAMIN D  25 Hydroxy (Vit-D Deficiency, Fractures) -     C-Terminal Telopeptide  Thyroid disorder screening -     TSH -     T4, free  Stress fracture of right tibia, initial encounter -     Parathyroid hormone, intact (no Ca) -     VITAMIN D  25 Hydroxy (Vit-D Deficiency, Fractures) -     C-Terminal Telopeptide  Vitamin D  deficiency -     VITAMIN D  25 Hydroxy (Vit-D Deficiency, Fractures)    DISPOSITION Follow up in clinic in 12 months suggested.  Labs today as ordered.  All questions answered and patient verbalized understanding of the plan.  Irisha Grandmaison, MD [facility] Endocrinology [facility] [address] [place], [address] [place], [place] [address] Phone  # [phone]  At least part of this note was generated using voice recognition software. Inadvertent word errors may have occurred, which were not recognized during the proofreading process.

## 2024-02-27 ENCOUNTER — Other Ambulatory Visit

## 2024-02-29 ENCOUNTER — Ambulatory Visit: Payer: Self-pay | Admitting: Endocrinology

## 2024-02-29 LAB — PARATHYROID HORMONE, INTACT (NO CA): PTH: 42 pg/mL (ref 16–77)

## 2024-02-29 LAB — RENAL FUNCTION PANEL
Albumin: 4.7 g/dL (ref 3.6–5.1)
BUN: 9 mg/dL (ref 7–25)
CO2: 31 mmol/L (ref 20–32)
Calcium: 9.8 mg/dL (ref 8.6–10.2)
Chloride: 103 mmol/L (ref 98–110)
Creat: 0.93 mg/dL (ref 0.50–0.99)
Glucose, Bld: 86 mg/dL (ref 65–99)
Phosphorus: 4.5 mg/dL (ref 2.5–4.5)
Potassium: 4.5 mmol/L (ref 3.5–5.3)
Sodium: 141 mmol/L (ref 135–146)

## 2024-02-29 LAB — VITAMIN D 25 HYDROXY (VIT D DEFICIENCY, FRACTURES): Vit D, 25-Hydroxy: 39 ng/mL (ref 30–100)

## 2024-02-29 LAB — T4, FREE: Free T4: 1.3 ng/dL (ref 0.8–1.8)

## 2024-02-29 LAB — TSH: TSH: 3.47 m[IU]/L

## 2024-02-29 LAB — C-TERMINAL TELOPEPTIDE: C-Telopeptide (CTx): 724 pg/mL — ABNORMAL HIGH (ref 50–465)

## 2024-03-14 ENCOUNTER — Other Ambulatory Visit

## 2024-03-14 DIAGNOSIS — M79604 Pain in right leg: Secondary | ICD-10-CM

## 2024-04-24 ENCOUNTER — Encounter: Payer: Self-pay | Admitting: Endocrinology

## 2024-04-24 ENCOUNTER — Ambulatory Visit (INDEPENDENT_AMBULATORY_CARE_PROVIDER_SITE_OTHER): Admitting: Endocrinology

## 2024-04-24 VITALS — BP 118/78 | HR 72 | Resp 18 | Ht 61.5 in | Wt 171.0 lb

## 2024-04-24 DIAGNOSIS — M84361A Stress fracture, right tibia, initial encounter for fracture: Secondary | ICD-10-CM

## 2024-04-24 DIAGNOSIS — E559 Vitamin D deficiency, unspecified: Secondary | ICD-10-CM

## 2024-04-24 DIAGNOSIS — M8589 Other specified disorders of bone density and structure, multiple sites: Secondary | ICD-10-CM

## 2024-04-24 MED ORDER — ALENDRONATE SODIUM 35 MG PO TABS: 35.0000 mg | ORAL_TABLET | ORAL | 3 refills | Status: AC

## 2024-04-24 NOTE — Patient Instructions (Signed)
Alendronate Solution What is this medication? ALENDRONATE (a LEN droe nate) treats osteoporosis. It works by Paramedic stronger and less likely to break (fracture). It belongs to a group of medications called bisphosphonates. This medicine may be used for other purposes; ask your health care provider or pharmacist if you have questions. COMMON BRAND NAME(S): Fosamax What should I tell my care team before I take this medication? They need to know if you have any of these conditions: Bleeding disorder Cancer Dental disease Difficulty swallowing Infection (fever, chills, cough, sore throat, pain or trouble passing urine) Kidney disease Low levels of calcium or other minerals in the blood Low red blood cell counts Receiving steroids like dexamethasone or prednisone Stomach or intestine problems Trouble sitting or standing for 30 minutes An unusual or allergic reaction to alendronate, other medications, foods, dyes or preservatives Pregnant or trying to get pregnant Breast-feeding How should I use this medication? Take this medication by mouth with a full glass of water. Take it as directed on the prescription label at the same time every day. Use a specially marked oral syringe, spoon, or dropper to measure each dose. Ask your pharmacist if you do not have one. Household spoons are not accurate. Take the dose right after waking up. Do not eat or drink anything before taking it. Do not take it with any other drink except water. After taking it, do not eat breakfast, drink, or take any other medications or vitamins for at least 30 minutes. Sit or stand up for at least 30 minutes after you take it. Do not lie down. Keep taking it unless your care team tells you to stop. A special MedGuide will be given to you by the pharmacist with each prescription and refill. Be sure to read this information carefully each time. Talk to your care team about the use of this medication in children. Special  care may be needed. Overdosage: If you think you have taken too much of this medicine contact a poison control center or emergency room at once. NOTE: This medicine is only for you. Do not share this medicine with others. What if I miss a dose? If you take your medication once a day, skip it. Take your next dose at the scheduled time the next morning. Do not take two doses on the same day. If you take your medication once a week, take the missed dose on the morning after you remember. Do not take two doses on the same day. What may interact with this medication? Aluminum hydroxide Antacids Aspirin Calcium supplements Iron supplements Magnesium supplements Medications for inflammation like ibuprofen, naproxen, and others Vitamins with minerals This list may not describe all possible interactions. Give your health care provider a list of all the medicines, herbs, non-prescription drugs, or dietary supplements you use. Also tell them if you smoke, drink alcohol, or use illegal drugs. Some items may interact with your medicine. What should I watch for while using this medication? Visit your care team for regular checks on your progress. It may be some time before you see the benefit from this medication. Some people who take this medication have severe bone, joint, or muscle pain. This medication may also increase your risk for jaw problems or a broken thigh bone. Tell your care team right away if you have severe pain in your jaw, bones, joints, or muscles. Tell you care team if you have any pain that does not go away or that gets worse. Tell your dentist  and dental surgeon that you are taking this medication. You should not have major dental surgery while on this medication. See your dentist to have a dental exam and fix any dental problems before starting this medication. Take good care of your teeth while on this medication. Make sure you see your dentist for regular follow-up appointments. You  should make sure you get enough calcium and vitamin D while you are taking this medication. Discuss the foods you eat and the vitamins you take with your care team. You may need blood work done while you are taking this medication. What side effects may I notice from receiving this medication? Side effects that you should report to your care team as soon as possible: Allergic reactions--skin rash, itching, hives, swelling of the face, lips, tongue, or throat Low calcium level--muscle pain or cramps, confusion, tingling, or numbness in the hands or feet Osteonecrosis of the jaw--pain, swelling, or redness in the mouth, numbness of the jaw, poor healing after dental work, unusual discharge from the mouth, visible bones in the mouth Pain or trouble swallowing Severe bone, joint, or muscle pain Stomach bleeding--bloody or black, tar-like stools, vomiting blood or brown material that looks like coffee grounds Side effects that usually do not require medical attention (report to your care team if they continue or are bothersome): Constipation Diarrhea Nausea Stomach pain This list may not describe all possible side effects. Call your doctor for medical advice about side effects. You may report side effects to FDA at 1-800-FDA-1088. Where should I keep my medication? Keep out of the reach of children and pets. Store at room temperature between 20 and 25 degrees C (68 and 77 degrees F). Do not freeze. Throw away any unused medication after the expiration date. NOTE: This sheet is a summary. It may not cover all possible information. If you have questions about this medicine, talk to your doctor, pharmacist, or health care provider.  2024 Elsevier/Gold Standard (2020-03-12 00:00:00)

## 2024-04-24 NOTE — Progress Notes (Signed)
 "  Outpatient Endocrinology Note Staisha Winiarski, MD   Patient's Name: Candice [name]    DOB: Aug 14, 1975    MRN: [id number]  REASON OF VISIT: Follow-up for osteopenia  REFERRING PROVIDER: Arnaldo Juliene RAMAN, MD  PCP: Verena Mems, MD  HISTORY OF PRESENT ILLNESS:   Candice [name] is a 49 y.o. old female with past medical history listed below, is here for follow-up of bone health issues /osteopenia.  Pertinent Bone Health History: Patient has osteopenia with elevated bone turnover marker CTX, started on antiresorptive therapy in January 2026.  Background:  - Patient was referred to endocrinology for the evaluation and management of osteopenia in the setting of stress fracture of right tibia in August 2025, was evaluated treated by sports medicine.  Initial consult/visit in December 2025.  Patient had right leg pain was initially evaluated in urgent care and later followed by sports medicine, she reports x-ray did not show fracture however MRI right lower extremity showed stress fracture of tibia nondisplaced.  Patient had surgical menopause in 2019, had a history of breast cancer status post hysterectomy with bilateral salpingo-oophorectomy in August 2019 at the age of 42-year.  Patient was not treated with estrogen hormone therapy.  Imagings: - Patient had DEXA scan in February 2020 with normal bone mineral density.  She had DEXA scan in August 2023 with osteopenia with lowest T-score of -2.0 at left distal forearm and T-score of -1.2 at total left femur.  - DEXA scan in August 2025 consistent with osteopenia with lowest T-score of -1.3 at right total hip, left total hip and left femoral neck.  FRAX score 10-year probability of major osteoporotic fracture 3.5% and hip fracture 0.2%.  Not able to compare bone mineral density from prior DEXA scans in 2020 and 2023 as they were on in different machines and different location.  Bone Health Concerns:  No history of smoking.  No excessive  alcohol intake.  No glucocorticoid use or antiseizure medication.  She does not take PPIs.  No history of fragility fracture.  No known malabsorption syndrome, liver disease, kidney stone.  She exercises daily.  She regularly exercises in the gym with cardio with elliptical.  Menopause: At the age of 32, surgical menopause.  She has history of breast cancer diagnosed in 2018, status postchemotherapy.  Status post radiotherapy in 2018 to the right breast.  Status post hysterectomy with bilateral salpingo-oophorectomy in 2019.  She was treated with anastrozole  from 2019 until mid of 2024.  Fracture history: -No fragility fracture.  Stress fracture of right tibia in August 2025 confirmed with MRI.  Osteoporosis medications: Started on Fosamax  in January 2026.  Secondary workup for osteoporosis: Normal parathyroid  hormone, electrolytes, serum calcium, thyroid function test.  Elevated CTX.  C-Telopeptide (CTx) 724 High     Calcium intake from supplements: Citracal 650 mg 2 times a day.   Dietary calcium intake: Minimal. Current vitamin D  intake: 1000 international units daily.   Relevant comorbidities: No history of bone cancer. No history of external radiation treatment to the breast in 2018, has history of breast cancer.   No history of diabetes mellitus.  No Rheumatoid arthritis. ? history of GERD.  She has constipation.  Occasional GERD-like symptoms. No history of recent invasive dental procedures. Not planning dental procedures in the near future.  Interval history  Patient presented for follow-up.  She had laboratory workup in December.  Patient had elevated bone turnover marker CTX 724.  Patient started to take calcium and vitamin D   supplement.  She has no fall and fracture.  She is active and performs regular exercise.  No other complaints today.  REVIEW OF SYSTEMS:  As per history of present illness.   PAST MEDICAL HISTORY: Past Medical History:  Diagnosis Date   Breast  cancer (HCC) 05/2016   right   Chronic constipation    Family history of cancer    Fibroid, uterine    IBS (irritable bowel syndrome)    IBS - C (constipation)   Personal history of chemotherapy    Personal history of radiation therapy     PAST SURGICAL HISTORY: Past Surgical History:  Procedure Laterality Date   BARTHOLIN CYST MARSUPIALIZATION  09/04/2002   BARTHOLIN GLAND CYST EXCISION Left 07/13/2005   BREAST LUMPECTOMY Right 06/07/2016   BREAST LUMPECTOMY WITH RADIOACTIVE SEED AND SENTINEL LYMPH NODE BIOPSY Right 06/07/2016   Procedure: BREAST LUMPECTOMY WITH RADIOACTIVE SEED AND SENTINEL LYMPH NODE BIOPSY;  Surgeon: Alm Angle, MD;  Location: [facility];  Service: General;  Laterality: Right;   HYSTEROSCOPY  2016   MYOMECTOMY  07/20/2004   Laparotomy   OVARIAN CYST REMOVAL Right 07/20/2004   PORTACATH PLACEMENT N/A 07/05/2016   REMOVED Fall 2018 ; Procedure: INSERTION PORT-A-CATH WITH US ;  Surgeon: Alm Angle, MD;  Location: [facility];  Service: General;  Laterality: N/A;   ROBOTIC ASSISTED TOTAL HYSTERECTOMY WITH BILATERAL SALPINGO OOPHERECTOMY Bilateral 01/04/2018   Procedure: XI ROBOTIC ASSISTED TOTAL HYSTERECTOMY WITH BILATERAL SALPINGO OOPHORECTOMY;  Surgeon: Anitra Freddy NOVAK, MD;  Location: [facility];  Service: Gynecology;  Laterality: Bilateral;    ALLERGIES: Allergies  Allergen Reactions   Paclitaxel  Itching   Amoxicillin Rash    Has patient had a PCN reaction causing immediate rash, facial/tongue/throat swelling, SOB or lightheadedness with hypotension: Yes Has patient had a PCN reaction causing severe rash involving mucus membranes or skin necrosis: Yes Has patient had a PCN reaction that required hospitalization: Yes Has patient had a PCN reaction occurring within the last 10 years: No If all of the above answers are NO, then may proceed with Cephalosporin use.     FAMILY HISTORY:  Family History  Problem Relation Age of  Onset   Breast cancer Mother 33   Pancreatic cancer Paternal Grandmother    Prostate cancer Paternal Grandfather    Ovarian cancer Other        MGMs sister    SOCIAL HISTORY: Social History   Socioeconomic History   Marital status: Single    Spouse name: Not on file   Number of children: Not on file   Years of education: Not on file   Highest education level: Not on file  Occupational History   Not on file  Tobacco Use   Smoking status: Never   Smokeless tobacco: Never  Vaping Use   Vaping status: Never Used  Substance and Sexual Activity   Alcohol use: Yes    Alcohol/week: 1.0 standard drink of alcohol    Types: 1 Glasses of wine per week    Comment: once per month   Drug use: No   Sexual activity: Not Currently    Partners: Male    Birth control/protection: None  Other Topics Concern   Not on file  Social History Narrative   Not on file   Social Drivers of Health   Tobacco Use: Low Risk (04/24/2024)   Patient History    Smoking Tobacco Use: Never    Smokeless Tobacco Use: Never    Passive Exposure: Not  on file  Financial Resource Strain: Not on file  Food Insecurity: Not on file  Transportation Needs: Not on file  Physical Activity: Not on file  Stress: Not on file  Social Connections: Not on file  Depression (EYV7-0): Not on file  Alcohol Screen: Not on file  Housing: Not on file  Utilities: Not on file  Health Literacy: Not on file    MEDICATIONS:  Current Outpatient Medications  Medication Sig Dispense Refill   alendronate  (FOSAMAX ) 35 MG tablet Take 1 tablet (35 mg total) by mouth every 7 (seven) days. Take with a full glass of water  on an empty stomach. 90 tablet 3   cholecalciferol (VITAMIN D3) 25 MCG (1000 UT) tablet Take 1 tablet (1,000 Units total) by mouth daily. 90 tablet 4   diclofenac (VOLTAREN) 75 MG EC tablet Take 75 mg by mouth 2 (two) times daily.     methocarbamol (ROBAXIN) 500 MG tablet Take 500 mg by mouth every 6 (six) hours as  needed (TAKE 1 TO 2 TABLETS BY MOUTH EVERY 6 TO 8 HOURS AS NEEDED FOR SPASMS OR MUSCLE TENSION).     naproxen (NAPROSYN) 500 MG tablet Take 1,000 mg by mouth every 12 (twelve) hours as needed for pain.     vitamin B-12 (CYANOCOBALAMIN) 100 MCG tablet Take 100 mcg by mouth daily.     etodolac (LODINE) 400 MG tablet Take 400 mg by mouth 2 (two) times daily. (Patient not taking: Reported on 04/24/2024)     valACYclovir (VALTREX) 500 MG tablet Take 500 mg by mouth daily. (Patient not taking: Reported on 04/24/2024)     No current facility-administered medications for this visit.    PHYSICAL EXAM: Vitals:   04/24/24 1358  BP: 118/78  Pulse: 72  Resp: 18  SpO2: 98%  Weight: 171 lb (77.6 kg)  Height: 5' 1.5 (1.562 m)   Body mass index is 31.79 kg/m.  Wt Readings from Last 3 Encounters:  04/24/24 171 lb (77.6 kg)  02/26/24 173 lb 9.6 oz (78.7 kg)  11/22/23 169 lb 12.8 oz (77 kg)    General: Well developed, well nourished female in no apparent distress.  HEENT: AT/[place], no external lesions. Hearing intact to the spoken word Eyes: EOMI. Conjunctiva clear and no icterus. Neck: Trachea midline, neck supple without appreciable thyromegaly or lymphadenopathy and no palpable thyroid nodules Lungs: Clear to auscultation, no wheeze. Respirations not labored Heart: S1S2, Regular in rate and rhythm. Abdomen: Soft, non tender, non distended Neurologic: Alert, oriented, normal speech, deep tendon biceps reflexes normal,  no gross focal neurological deficit Extremities: No pedal pitting edema, no tremors of outstretched hands. No spine tenderness Skin: Warm, color good.  Psychiatric: Does not appear depressed or anxious  PERTINENT HISTORIC LABORATORY AND IMAGING STUDIES:  All pertinent laboratory results were reviewed. Please see HPI also for further details.   Lab Results  Component Value Date   ALKPHOS 65 05/19/2021   ALKPHOS 76 06/02/2020   ALKPHOS 113 06/03/2019     ASSESSMENT / PLAN  1.  Osteopenia of multiple sites   2. Stress fracture of right tibia, initial encounter   3. Vitamin D  deficiency    Patient has osteopenia based on DEXA scan in August 2025 and also in DEXA scan August 2023, lowest T-score of -1.3 in bilateral total hip and left femoral neck.  She had early surgical menopause at the age of 2 years.  She has history of breast cancer treated with chemotherapy and radiotherapy including anastrozole  from 2019  until mid of 2024.  No other obvious risk factors for bone loss / osteoporosis.  Patient had stress fracture of right tibia confirmed with MRI.  Stress fracture of the leg is not considered as fragility fracture due to osteoporosis.  It is usually related to repeated hip trauma.  She denies trauma or excessive running however she does gym exercise with cardio on elliptical.  Recent DEXA scan in August 2025 consistent with osteopenia, lowest T-score of - 1.3 and acceptable FRAX score.  Bone turnover marker CTX elevated 10/15/2022.  Plan: -Start Fosamax /alendronate  35 mg weekly for treatment of osteopenia.  Discussed proper intake.  Provided written instruction.  Discussed side effects. -Continue vitamin D3 1000 international unit daily and calcium 1000 to 1500 mg/day in divided doses, she is currently taking 650 mg 2 times a day plus dietary intake of calcium. -Regular exercise including weightbearing exercise as tolerated. -Discussed about fall precautions. - Annual endocrinology follow-up.  Candice [name] was seen today for osteopenia.  Diagnoses and all orders for this visit:  Osteopenia of multiple sites -     alendronate  (FOSAMAX ) 35 MG tablet; Take 1 tablet (35 mg total) by mouth every 7 (seven) days. Take with a full glass of water  on an empty stomach.  Stress fracture of right tibia, initial encounter  Vitamin D  deficiency   DISPOSITION Follow up in clinic in 12 months suggested.  All questions answered and patient verbalized understanding of the  plan.  Candice Chumney, MD [facility] Endocrinology [facility] [address] [place], [address] [place], [place] [address] Phone # [phone]  At least part of this note was generated using voice recognition software. Inadvertent word errors may have occurred, which were not recognized during the proofreading process. "

## 2025-02-25 ENCOUNTER — Ambulatory Visit: Admitting: Endocrinology

## 2025-04-24 ENCOUNTER — Ambulatory Visit: Admitting: Endocrinology
